# Patient Record
Sex: Female | Born: 1955
Health system: Southern US, Community
[De-identification: ages and names within clinical notes are randomized; demographics above are authoritative.]

## PROBLEM LIST (undated history)

## (undated) DIAGNOSIS — J4530 Mild persistent asthma, uncomplicated: Secondary | ICD-10-CM

## (undated) DIAGNOSIS — S83249A Other tear of medial meniscus, current injury, unspecified knee, initial encounter: Secondary | ICD-10-CM

## (undated) DIAGNOSIS — IMO0001 Reserved for inherently not codable concepts without codable children: Secondary | ICD-10-CM

## (undated) DIAGNOSIS — C50312 Malignant neoplasm of lower-inner quadrant of left female breast: Secondary | ICD-10-CM

## (undated) DIAGNOSIS — K219 Gastro-esophageal reflux disease without esophagitis: Secondary | ICD-10-CM

## (undated) DIAGNOSIS — Z170 Estrogen receptor positive status [ER+]: Principal | ICD-10-CM

## (undated) DIAGNOSIS — M19079 Primary osteoarthritis, unspecified ankle and foot: Secondary | ICD-10-CM

## (undated) DIAGNOSIS — Z923 Personal history of irradiation: Secondary | ICD-10-CM

## (undated) DIAGNOSIS — J189 Pneumonia, unspecified organism: Secondary | ICD-10-CM

## (undated) DIAGNOSIS — B009 Herpesviral infection, unspecified: Secondary | ICD-10-CM

## (undated) DIAGNOSIS — R0600 Dyspnea, unspecified: Secondary | ICD-10-CM

## (undated) DIAGNOSIS — M1990 Unspecified osteoarthritis, unspecified site: Secondary | ICD-10-CM

## (undated) DIAGNOSIS — Z9221 Personal history of antineoplastic chemotherapy: Secondary | ICD-10-CM

## (undated) DIAGNOSIS — R87619 Unspecified abnormal cytological findings in specimens from cervix uteri: Secondary | ICD-10-CM

## (undated) DIAGNOSIS — E1165 Type 2 diabetes mellitus with hyperglycemia: Secondary | ICD-10-CM

## (undated) DIAGNOSIS — J309 Allergic rhinitis, unspecified: Secondary | ICD-10-CM

## (undated) DIAGNOSIS — C50919 Malignant neoplasm of unspecified site of unspecified female breast: Secondary | ICD-10-CM

## (undated) DIAGNOSIS — D219 Benign neoplasm of connective and other soft tissue, unspecified: Secondary | ICD-10-CM

## (undated) HISTORY — DX: Gastro-esophageal reflux disease without esophagitis: K21.9

## (undated) HISTORY — PX: TUBAL LIGATION: SHX77

## (undated) HISTORY — DX: Benign neoplasm of connective and other soft tissue, unspecified: D21.9

## (undated) HISTORY — PX: DILATION AND CURETTAGE OF UTERUS: SHX78

## (undated) HISTORY — DX: Personal history of irradiation: Z92.3

## (undated) HISTORY — DX: Mild persistent asthma, uncomplicated: J45.30

## (undated) HISTORY — DX: Reserved for inherently not codable concepts without codable children: IMO0001

## (undated) HISTORY — DX: Allergic rhinitis, unspecified: J30.9

## (undated) HISTORY — PX: BREAST LUMPECTOMY: SHX2

## (undated) HISTORY — PX: BREAST BIOPSY: SHX20

## (undated) HISTORY — DX: Estrogen receptor positive status (ER+): Z17.0

## (undated) HISTORY — DX: Malignant neoplasm of lower-inner quadrant of left female breast: C50.312

## (undated) HISTORY — DX: Type 2 diabetes mellitus with hyperglycemia: E11.65

## (undated) HISTORY — DX: Herpesviral infection, unspecified: B00.9

## (undated) HISTORY — DX: Morbid (severe) obesity due to excess calories: E66.01

## (undated) HISTORY — PX: TONSILLECTOMY: SUR1361

## (undated) HISTORY — DX: Unspecified abnormal cytological findings in specimens from cervix uteri: R87.619

---

## 2000-10-13 ENCOUNTER — Encounter: Admission: RE | Admit: 2000-10-13 | Discharge: 2000-10-13 | Payer: Self-pay | Admitting: *Deleted

## 2000-10-14 ENCOUNTER — Encounter: Admission: RE | Admit: 2000-10-14 | Discharge: 2000-10-14 | Payer: Self-pay | Admitting: *Deleted

## 2003-11-24 ENCOUNTER — Emergency Department (HOSPITAL_COMMUNITY): Admission: EM | Admit: 2003-11-24 | Discharge: 2003-11-24 | Payer: Self-pay | Admitting: Internal Medicine

## 2004-12-07 ENCOUNTER — Emergency Department (HOSPITAL_COMMUNITY): Admission: EM | Admit: 2004-12-07 | Discharge: 2004-12-07 | Payer: Self-pay | Admitting: Emergency Medicine

## 2004-12-31 ENCOUNTER — Emergency Department (HOSPITAL_COMMUNITY): Admission: EM | Admit: 2004-12-31 | Discharge: 2004-12-31 | Payer: Self-pay | Admitting: Family Medicine

## 2005-01-05 ENCOUNTER — Emergency Department (HOSPITAL_COMMUNITY): Admission: EM | Admit: 2005-01-05 | Discharge: 2005-01-05 | Payer: Self-pay | Admitting: Family Medicine

## 2005-04-16 ENCOUNTER — Emergency Department (HOSPITAL_COMMUNITY): Admission: EM | Admit: 2005-04-16 | Discharge: 2005-04-16 | Payer: Self-pay | Admitting: Family Medicine

## 2006-05-26 ENCOUNTER — Emergency Department (HOSPITAL_COMMUNITY): Admission: EM | Admit: 2006-05-26 | Discharge: 2006-05-26 | Payer: Self-pay | Admitting: Family Medicine

## 2006-06-29 DIAGNOSIS — D219 Benign neoplasm of connective and other soft tissue, unspecified: Secondary | ICD-10-CM

## 2006-06-29 HISTORY — DX: Benign neoplasm of connective and other soft tissue, unspecified: D21.9

## 2006-06-29 HISTORY — PX: HYSTEROSCOPY W/D&C: SHX1775

## 2006-07-09 ENCOUNTER — Ambulatory Visit: Payer: Self-pay | Admitting: Internal Medicine

## 2006-07-09 LAB — CONVERTED CEMR LAB
Basophils Absolute: 0.1 10*3/uL (ref 0.0–0.1)
Basophils Relative: 0.7 % (ref 0.0–1.0)
Eosinophil percent: 2.4 % (ref 0.0–5.0)
FSH: 14.1 milliintl units/mL
HCT: 41.1 % (ref 36.0–46.0)
Hemoglobin: 13.4 g/dL (ref 12.0–15.0)
INR: 1.1 (ref 0.9–2.0)
Iron: 53 ug/dL (ref 42–145)
Lymphocytes Relative: 24.1 % (ref 12.0–46.0)
MCHC: 32.6 g/dL (ref 30.0–36.0)
MCV: 81.5 fL (ref 78.0–100.0)
Monocytes Absolute: 0.8 10*3/uL — ABNORMAL HIGH (ref 0.2–0.7)
Monocytes Relative: 8.7 % (ref 3.0–11.0)
Neutro Abs: 5.9 10*3/uL (ref 1.4–7.7)
Neutrophils Relative %: 64.1 % (ref 43.0–77.0)
Platelets: 394 10*3/uL (ref 150–400)
Prothrombin Time: 12.9 s (ref 10.0–14.0)
RBC: 5.04 M/uL (ref 3.87–5.11)
RDW: 14.3 % (ref 11.5–14.6)
Saturation Ratios: 13.4 % — ABNORMAL LOW (ref 20.0–50.0)
Transferrin: 283.2 mg/dL (ref >=212.0)
WBC: 9.2 10*3/uL (ref 4.5–10.5)

## 2006-07-13 ENCOUNTER — Encounter: Admission: RE | Admit: 2006-07-13 | Discharge: 2006-07-13 | Payer: Self-pay | Admitting: Internal Medicine

## 2006-08-16 ENCOUNTER — Ambulatory Visit (HOSPITAL_COMMUNITY): Admission: RE | Admit: 2006-08-16 | Discharge: 2006-08-16 | Payer: Self-pay | Admitting: *Deleted

## 2006-08-16 ENCOUNTER — Encounter (INDEPENDENT_AMBULATORY_CARE_PROVIDER_SITE_OTHER): Payer: Self-pay | Admitting: Specialist

## 2007-01-18 ENCOUNTER — Emergency Department (HOSPITAL_COMMUNITY): Admission: EM | Admit: 2007-01-18 | Discharge: 2007-01-18 | Payer: Self-pay | Admitting: Emergency Medicine

## 2007-04-21 ENCOUNTER — Encounter: Admission: RE | Admit: 2007-04-21 | Discharge: 2007-04-21 | Payer: Self-pay | Admitting: Internal Medicine

## 2007-06-30 DIAGNOSIS — R87619 Unspecified abnormal cytological findings in specimens from cervix uteri: Secondary | ICD-10-CM

## 2007-06-30 HISTORY — DX: Unspecified abnormal cytological findings in specimens from cervix uteri: R87.619

## 2008-03-19 ENCOUNTER — Encounter: Payer: Self-pay | Admitting: Internal Medicine

## 2008-06-12 ENCOUNTER — Emergency Department (HOSPITAL_COMMUNITY): Admission: EM | Admit: 2008-06-12 | Discharge: 2008-06-12 | Payer: Self-pay | Admitting: Emergency Medicine

## 2008-08-05 ENCOUNTER — Emergency Department (HOSPITAL_COMMUNITY): Admission: EM | Admit: 2008-08-05 | Discharge: 2008-08-05 | Payer: Self-pay | Admitting: Family Medicine

## 2008-08-29 ENCOUNTER — Encounter: Admission: RE | Admit: 2008-08-29 | Discharge: 2008-08-29 | Payer: Self-pay | Admitting: Internal Medicine

## 2008-09-03 ENCOUNTER — Encounter: Payer: Self-pay | Admitting: Internal Medicine

## 2008-09-05 ENCOUNTER — Encounter: Admission: RE | Admit: 2008-09-05 | Discharge: 2008-09-05 | Payer: Self-pay | Admitting: Internal Medicine

## 2009-12-16 ENCOUNTER — Emergency Department (HOSPITAL_COMMUNITY): Admission: EM | Admit: 2009-12-16 | Discharge: 2009-12-16 | Payer: Self-pay | Admitting: Family Medicine

## 2010-07-07 ENCOUNTER — Ambulatory Visit
Admission: RE | Admit: 2010-07-07 | Discharge: 2010-07-07 | Payer: Self-pay | Source: Home / Self Care | Attending: Orthopedic Surgery | Admitting: Orthopedic Surgery

## 2010-07-07 HISTORY — PX: KNEE ARTHROSCOPY W/ MENISCECTOMY: SHX1879

## 2010-07-14 LAB — POCT HEMOGLOBIN-HEMACUE: Hemoglobin: 14.5 g/dL (ref 12.0–15.0)

## 2010-07-29 ENCOUNTER — Encounter
Admission: RE | Admit: 2010-07-29 | Discharge: 2010-07-29 | Payer: Self-pay | Source: Home / Self Care | Attending: Orthopedic Surgery | Admitting: Orthopedic Surgery

## 2010-07-31 ENCOUNTER — Ambulatory Visit: Payer: PRIVATE HEALTH INSURANCE | Attending: Orthopedic Surgery | Admitting: Physical Therapy

## 2010-07-31 DIAGNOSIS — M25569 Pain in unspecified knee: Secondary | ICD-10-CM | POA: Insufficient documentation

## 2010-07-31 DIAGNOSIS — M6281 Muscle weakness (generalized): Secondary | ICD-10-CM | POA: Insufficient documentation

## 2010-07-31 DIAGNOSIS — R5381 Other malaise: Secondary | ICD-10-CM | POA: Insufficient documentation

## 2010-07-31 DIAGNOSIS — IMO0001 Reserved for inherently not codable concepts without codable children: Secondary | ICD-10-CM | POA: Insufficient documentation

## 2010-08-04 ENCOUNTER — Ambulatory Visit: Payer: PRIVATE HEALTH INSURANCE | Admitting: Physical Therapy

## 2010-08-06 ENCOUNTER — Ambulatory Visit: Payer: PRIVATE HEALTH INSURANCE

## 2010-08-11 ENCOUNTER — Ambulatory Visit: Payer: PRIVATE HEALTH INSURANCE

## 2010-11-14 NOTE — Op Note (Signed)
[name], Sandra Wood            ACCOUNT NO.:  [id number]   MEDICAL RECORD NO.:  [id number]          PATIENT TYPE:  AMB   LOCATION:  SDC                           FACILITY:  [facility]   PHYSICIAN:  Ashton B. Earlene Plater, M.D.  DATE OF BIRTH:  January 03, 1956   DATE OF PROCEDURE:  08/16/2006  DATE OF DISCHARGE:                               OPERATIVE REPORT   PREOPERATIVE DIAGNOSES:  1. Abnormal uterine bleeding.  2. Endometrial polyp.   POSTOPERATIVE DIAGNOSES:  1. Abnormal uterine bleeding.  2. Endometrial polyp.   PROCEDURE:  Hysteroscopy, D&C.   SURGEON:  Bellflower B. Earlene Plater, M.D.   ASSISTANT:  None.   ANESTHESIA:  LMA general.   SPECIMENS:  Endometrial curettings to pathology.   ESTIMATED BLOOD LOSS:  Minimal.   COMPLICATIONS:  None.   FLUID DEFICIT:  50 mL.   INDICATIONS FOR PROCEDURE:  Patient with a history of abnormal uterine  bleeding, history of obesity, and perimenopause.  Ultrasound showed a  thickened stripe and some small intramural fibroids.  Endometrial  biopsies showed a proliferative endometrium and features suggestive of  an endometrial polyp.  The patient was advised of the risks of surgery,  including infection, bleeding, uterine perforation, damage to  surrounding organs.   DESCRIPTION OF PROCEDURE:  The patient was taken to the operating table  and LMA anesthesia obtained.  She was prepped and draped in the standard  fashion and the bladder emptied with in-and-out catheterization.  Examination under anesthesia showed anteverted, normal-size uterus.  No  adnexal masses.   The speculum was inserted and paracervical block placed with 20 mL of 1%  Nesacaine.   The diagnostic hysteroscope was inserted after being flushed.  Good  uterine distension obtained.  Overall the endometrium appeared as  proliferative.  There were no definitive focal masses seen.  Some areas  of proliferative endometrium looked somewhat like a polyp.  The  endometrium was gently curetted.   The Randall stone forceps were used to  remove any additional tissue and the procedure terminated.   The instruments were removed.  The cervix was hemostatic.  The patient  tolerated the procedure well with no complications.  She was taken to  the recovery room awake, alert, and in stable condition.      Gerri Spore B. Earlene Plater, M.D.  Electronically Signed     WBD/MEDQ  D:  08/16/2006  T:  08/16/2006  Job:  [id number]

## 2011-03-03 ENCOUNTER — Inpatient Hospital Stay (INDEPENDENT_AMBULATORY_CARE_PROVIDER_SITE_OTHER)
Admission: RE | Admit: 2011-03-03 | Discharge: 2011-03-03 | Disposition: A | Discharge status: Home / Self Care | Payer: PRIVATE HEALTH INSURANCE | Source: Ambulatory Visit | Attending: Emergency Medicine | Admitting: Emergency Medicine

## 2011-03-03 DIAGNOSIS — J309 Allergic rhinitis, unspecified: Secondary | ICD-10-CM

## 2011-04-13 LAB — URINALYSIS, ROUTINE W REFLEX MICROSCOPIC
Bilirubin Urine: NEGATIVE
Glucose, UA: NEGATIVE
Hgb urine dipstick: NEGATIVE
Ketones, ur: NEGATIVE
Nitrite: NEGATIVE
Protein, ur: NEGATIVE
Specific Gravity, Urine: 1.018
Urobilinogen, UA: 0.2
pH: 6.5

## 2011-04-13 LAB — POCT URINALYSIS DIP (DEVICE)
Bilirubin Urine: NEGATIVE
Glucose, UA: NEGATIVE
Hgb urine dipstick: NEGATIVE
Ketones, ur: NEGATIVE
Nitrite: NEGATIVE
Operator id: 116391
Protein, ur: NEGATIVE
Specific Gravity, Urine: 1.02
Urobilinogen, UA: 0.2
pH: 6.5

## 2011-04-13 LAB — URINE CULTURE: Colony Count: 85000

## 2011-04-13 LAB — URINE MICROSCOPIC-ADD ON

## 2011-08-05 ENCOUNTER — Other Ambulatory Visit: Payer: Self-pay | Admitting: Orthopedic Surgery

## 2011-08-14 ENCOUNTER — Encounter (HOSPITAL_BASED_OUTPATIENT_CLINIC_OR_DEPARTMENT_OTHER): Payer: Self-pay | Admitting: *Deleted

## 2011-08-14 NOTE — Progress Notes (Signed)
NPO AFTER MN. ARRIVES AT 0700. NEEDS HG AND EKG. 

## 2011-08-19 ENCOUNTER — Encounter (HOSPITAL_BASED_OUTPATIENT_CLINIC_OR_DEPARTMENT_OTHER)
Admission: RE | Disposition: A | Discharge status: Home / Self Care | Payer: Self-pay | Source: Ambulatory Visit | Attending: Orthopedic Surgery

## 2011-08-19 ENCOUNTER — Encounter (HOSPITAL_BASED_OUTPATIENT_CLINIC_OR_DEPARTMENT_OTHER): Payer: Self-pay | Admitting: Anesthesiology

## 2011-08-19 ENCOUNTER — Ambulatory Visit (HOSPITAL_BASED_OUTPATIENT_CLINIC_OR_DEPARTMENT_OTHER)
Admission: RE | Admit: 2011-08-19 | Discharge: 2011-08-19 | Disposition: A | Discharge status: Home / Self Care | Payer: PRIVATE HEALTH INSURANCE | Source: Ambulatory Visit | Attending: Orthopedic Surgery | Admitting: Orthopedic Surgery

## 2011-08-19 ENCOUNTER — Ambulatory Visit (HOSPITAL_BASED_OUTPATIENT_CLINIC_OR_DEPARTMENT_OTHER): Payer: PRIVATE HEALTH INSURANCE | Admitting: Anesthesiology

## 2011-08-19 ENCOUNTER — Other Ambulatory Visit: Payer: Self-pay

## 2011-08-19 ENCOUNTER — Encounter (HOSPITAL_BASED_OUTPATIENT_CLINIC_OR_DEPARTMENT_OTHER): Payer: Self-pay | Admitting: *Deleted

## 2011-08-19 DIAGNOSIS — Z87898 Personal history of other specified conditions: Secondary | ICD-10-CM

## 2011-08-19 DIAGNOSIS — Z79899 Other long term (current) drug therapy: Secondary | ICD-10-CM | POA: Insufficient documentation

## 2011-08-19 DIAGNOSIS — X58XXXA Exposure to other specified factors, initial encounter: Secondary | ICD-10-CM | POA: Insufficient documentation

## 2011-08-19 DIAGNOSIS — IMO0002 Reserved for concepts with insufficient information to code with codable children: Secondary | ICD-10-CM | POA: Insufficient documentation

## 2011-08-19 DIAGNOSIS — M1710 Unilateral primary osteoarthritis, unspecified knee: Secondary | ICD-10-CM | POA: Insufficient documentation

## 2011-08-19 DIAGNOSIS — S83289A Other tear of lateral meniscus, current injury, unspecified knee, initial encounter: Secondary | ICD-10-CM | POA: Insufficient documentation

## 2011-08-19 HISTORY — DX: Other tear of medial meniscus, current injury, unspecified knee, initial encounter: S83.249A

## 2011-08-19 HISTORY — DX: Primary osteoarthritis, unspecified ankle and foot: M19.079

## 2011-08-19 HISTORY — DX: Unspecified osteoarthritis, unspecified site: M19.90

## 2011-08-19 HISTORY — PX: MENISECTOMY: SHX5181

## 2011-08-19 HISTORY — PX: CHONDROPLASTY: SHX5177

## 2011-08-19 SURGERY — ARTHROSCOPY, KNEE, WITH MEDIAL MENISCECTOMY
Anesthesia: General | Site: Knee | Laterality: Right | Wound class: Clean

## 2011-08-19 MED ORDER — ACETAMINOPHEN 650 MG RE SUPP: 650.0000 mg | Freq: Four times a day (QID) | RECTAL | Status: DC | PRN

## 2011-08-19 MED ORDER — ONDANSETRON HCL 4 MG/2ML IJ SOLN
INTRAMUSCULAR | Status: DC | PRN
  Administered 2011-08-19: 4 mg via INTRAVENOUS

## 2011-08-19 MED ORDER — METOCLOPRAMIDE HCL 5 MG PO TABS: 5.0000 mg | ORAL_TABLET | Freq: Three times a day (TID) | ORAL | Status: DC | PRN

## 2011-08-19 MED ORDER — OXYCODONE-ACETAMINOPHEN 5-325 MG PO TABS
1.0000 | ORAL_TABLET | Freq: Once | ORAL | Status: AC
  Administered 2011-08-19: 1 via ORAL

## 2011-08-19 MED ORDER — METHOCARBAMOL 500 MG PO TABS: 500.0000 mg | ORAL_TABLET | Freq: Four times a day (QID) | ORAL | Status: DC | PRN

## 2011-08-19 MED ORDER — MENTHOL 3 MG MT LOZG: 1.0000 | LOZENGE | OROMUCOSAL | Status: DC | PRN

## 2011-08-19 MED ORDER — LIDOCAINE HCL (CARDIAC) 20 MG/ML IV SOLN
INTRAVENOUS | Status: DC | PRN
  Administered 2011-08-19: 80 mg via INTRAVENOUS

## 2011-08-19 MED ORDER — ALBUTEROL SULFATE HFA 108 (90 BASE) MCG/ACT IN AERS
INHALATION_SPRAY | RESPIRATORY_TRACT | Status: DC | PRN
  Administered 2011-08-19 (×2): 2 via RESPIRATORY_TRACT

## 2011-08-19 MED ORDER — PROPOFOL 10 MG/ML IV EMUL
INTRAVENOUS | Status: DC | PRN
  Administered 2011-08-19: 200 mg via INTRAVENOUS
  Administered 2011-08-19: 50 mg via INTRAVENOUS

## 2011-08-19 MED ORDER — OXYCODONE-ACETAMINOPHEN 5-325 MG PO TABS: 1.0000 | ORAL_TABLET | ORAL | Status: AC | PRN

## 2011-08-19 MED ORDER — FENTANYL CITRATE 0.05 MG/ML IJ SOLN
INTRAMUSCULAR | Status: DC | PRN
  Administered 2011-08-19 (×2): 50 ug via INTRAVENOUS
  Administered 2011-08-19 (×2): 25 ug via INTRAVENOUS

## 2011-08-19 MED ORDER — DEXAMETHASONE SODIUM PHOSPHATE 4 MG/ML IJ SOLN
INTRAMUSCULAR | Status: DC | PRN
  Administered 2011-08-19: 10 mg via INTRAVENOUS

## 2011-08-19 MED ORDER — ONDANSETRON HCL 4 MG/2ML IJ SOLN: 4.0000 mg | Freq: Four times a day (QID) | INTRAMUSCULAR | Status: DC | PRN

## 2011-08-19 MED ORDER — ONDANSETRON HCL 4 MG PO TABS: 4.0000 mg | ORAL_TABLET | Freq: Four times a day (QID) | ORAL | Status: DC | PRN

## 2011-08-19 MED ORDER — POVIDONE-IODINE 7.5 % EX SOLN: Freq: Once | CUTANEOUS | Status: DC

## 2011-08-19 MED ORDER — METHOCARBAMOL 500 MG PO TABS: 500.0000 mg | ORAL_TABLET | Freq: Four times a day (QID) | ORAL | Status: AC | PRN

## 2011-08-19 MED ORDER — FENTANYL CITRATE 0.05 MG/ML IJ SOLN
25.0000 ug | INTRAMUSCULAR | Status: DC | PRN
  Administered 2011-08-19 (×2): 25 ug via INTRAVENOUS

## 2011-08-19 MED ORDER — METOCLOPRAMIDE HCL 5 MG/ML IJ SOLN: 5.0000 mg | Freq: Three times a day (TID) | INTRAMUSCULAR | Status: DC | PRN

## 2011-08-19 MED ORDER — PROMETHAZINE HCL 25 MG/ML IJ SOLN: 6.2500 mg | INTRAMUSCULAR | Status: DC | PRN

## 2011-08-19 MED ORDER — MORPHINE SULFATE 4 MG/ML IJ SOLN
INTRAMUSCULAR | Status: DC | PRN
  Administered 2011-08-19: 09:00:00 via INTRA_ARTICULAR

## 2011-08-19 MED ORDER — PHENOL 1.4 % MT LIQD: 1.0000 | OROMUCOSAL | Status: DC | PRN

## 2011-08-19 MED ORDER — SODIUM CHLORIDE 0.9 % IR SOLN
Status: DC | PRN
  Administered 2011-08-19: 09:00:00

## 2011-08-19 MED ORDER — LACTATED RINGERS IV SOLN: INTRAVENOUS | Status: DC

## 2011-08-19 MED ORDER — METHOCARBAMOL 100 MG/ML IJ SOLN: 500.0000 mg | Freq: Four times a day (QID) | INTRAVENOUS | Status: DC | PRN

## 2011-08-19 MED ORDER — ACETAMINOPHEN 325 MG PO TABS: 650.0000 mg | ORAL_TABLET | Freq: Four times a day (QID) | ORAL | Status: DC | PRN

## 2011-08-19 MED ORDER — HYDROMORPHONE HCL PF 1 MG/ML IJ SOLN: 0.5000 mg | INTRAMUSCULAR | Status: DC | PRN

## 2011-08-19 MED ORDER — LACTATED RINGERS IV SOLN
INTRAVENOUS | Status: DC
  Administered 2011-08-19 (×3): via INTRAVENOUS

## 2011-08-19 SURGICAL SUPPLY — 51 items
BANDAGE ELASTIC 6 VELCRO ST LF (GAUZE/BANDAGES/DRESSINGS) ×3 IMPLANT
BANDAGE ESMARK 6X9 LF (GAUZE/BANDAGES/DRESSINGS) ×2 IMPLANT
BANDAGE GAUZE ELAST BULKY 4 IN (GAUZE/BANDAGES/DRESSINGS) ×3 IMPLANT
BLADE 4.2CUDA (BLADE) IMPLANT
BLADE CUDA 5.5 (BLADE) IMPLANT
BLADE CUDA SHAVER 3.5 (BLADE) ×3 IMPLANT
BLADE CUTTER GATOR 3.5 (BLADE) IMPLANT
BLADE GREAT WHITE 4.2 (BLADE) IMPLANT
BNDG ESMARK 6X9 LF (GAUZE/BANDAGES/DRESSINGS) ×3
CANISTER SUCT LVC 12 LTR MEDI- (MISCELLANEOUS) ×3 IMPLANT
CANISTER SUCTION 1200CC (MISCELLANEOUS) IMPLANT
CLOTH BEACON ORANGE TIMEOUT ST (SAFETY) ×3 IMPLANT
DRAPE ARTHROSCOPY W/POUCH 114 (DRAPES) ×3 IMPLANT
DRAPE LG THREE QUARTER DISP (DRAPES) ×3 IMPLANT
DRSG EMULSION OIL 3X3 NADH (GAUZE/BANDAGES/DRESSINGS) ×3 IMPLANT
DURAPREP 26ML APPLICATOR (WOUND CARE) ×3 IMPLANT
ELECT MENISCUS 165MM 90D (ELECTRODE) IMPLANT
ELECT REM PT RETURN 9FT ADLT (ELECTROSURGICAL)
ELECTRODE REM PT RTRN 9FT ADLT (ELECTROSURGICAL) IMPLANT
GLOVE BIOGEL M 6.5 STRL (GLOVE) ×3 IMPLANT
GLOVE BIOGEL PI IND STRL 8 (GLOVE) ×2 IMPLANT
GLOVE BIOGEL PI INDICATOR 8 (GLOVE) ×1
GLOVE ECLIPSE 6.0 STRL STRAW (GLOVE) ×3 IMPLANT
GLOVE ECLIPSE 8.0 STRL XLNG CF (GLOVE) ×6 IMPLANT
GLOVE INDICATOR 8.0 STRL GRN (GLOVE) ×3 IMPLANT
GOWN PREVENTION PLUS LG XLONG (DISPOSABLE) ×3 IMPLANT
GOWN STRL REIN XL XLG (GOWN DISPOSABLE) ×3 IMPLANT
GOWN SURGICAL LARGE (GOWNS) ×3 IMPLANT
IV NS IRRIG 3000ML ARTHROMATIC (IV SOLUTION) ×6 IMPLANT
KNEE WRAP E Z 3 GEL PACK (MISCELLANEOUS) ×3 IMPLANT
NDL SAFETY ECLIPSE 18X1.5 (NEEDLE) ×2 IMPLANT
NEEDLE HYPO 18GX1.5 BLUNT FILL (NEEDLE) ×3 IMPLANT
NEEDLE HYPO 18GX1.5 SHARP (NEEDLE) ×1
PACK ARTHROSCOPY DSU (CUSTOM PROCEDURE TRAY) ×3 IMPLANT
PACK BASIN DAY SURGERY FS (CUSTOM PROCEDURE TRAY) ×3 IMPLANT
PADDING CAST ABS 4INX4YD NS (CAST SUPPLIES) ×1
PADDING CAST ABS COTTON 4X4 ST (CAST SUPPLIES) ×2 IMPLANT
PADDING WEBRIL 6 STERILE (GAUZE/BANDAGES/DRESSINGS) ×3 IMPLANT
PENCIL BUTTON HOLSTER BLD 10FT (ELECTRODE) IMPLANT
SET ARTHROSCOPY TUBING (MISCELLANEOUS) ×1
SET ARTHROSCOPY TUBING LN (MISCELLANEOUS) ×2 IMPLANT
SPONGE GAUZE 4X4 12PLY (GAUZE/BANDAGES/DRESSINGS) ×3 IMPLANT
SUT ETHIBOND 2 OS 4 DA (SUTURE) IMPLANT
SUT ETHILON 4 0 PS 2 18 (SUTURE) ×3 IMPLANT
SUT VIC AB 0 CT1 36 (SUTURE) IMPLANT
SUT VIC AB 2-0 PS2 27 (SUTURE) IMPLANT
SYRINGE 10CC LL (SYRINGE) ×3 IMPLANT
TOWEL NATURAL 6PK STERILE (DISPOSABLE) ×3 IMPLANT
TOWEL OR 17X24 6PK STRL BLUE (TOWEL DISPOSABLE) ×3 IMPLANT
WAND 90 DEG TURBOVAC W/CORD (SURGICAL WAND) IMPLANT
WATER STERILE IRR 500ML POUR (IV SOLUTION) ×3 IMPLANT

## 2011-08-19 NOTE — Brief Op Note (Signed)
08/19/2011  9:26 AM  PATIENT:  Sandra Wood  56 y.o. female  PRE-OPERATIVE DIAGNOSIS:  RIGHT KNEE TORN MEDIAL MENISCUS and osteoarthritis  POST-OPERATIVE DIAGNOSIS:  RIGHT KNEE TORN MEDIAL MENISCUS ,torn lateral meniscus and osteoarthritis  PROCEDURE:  Procedure(s) (LRB): KNEE ARTHROSCOPY WITH partial medial and lateral meniscectomies,shaving medial femoral condyle CHONDROPLASTY () MENISECTOMY ()  SURGEON:  Surgeon(s) and Role:    * Drucilla Schmidt, MD - Primary  PHYSICIAN ASSISTANT:   ASSISTANTS: nurse   ANESTHESIA:   general  EBL:  Total I/O In: 1000 [I.V.:1000] Out: -   BLOOD ADMINISTERED:none  DRAINS: none   LOCAL MEDICATIONS USED:  MARCAINE     SPECIMEN:  No Specimen  DISPOSITION OF SPECIMEN:  N/A  COUNTS:  YES  TOURNIQUET:   Total Tourniquet Time Documented: Thigh (Right) - 35 minutes  DICTATION: .Other Dictation: Dictation Number [id number]  PLAN OF CARE: Discharge to home after PACU  PATIENT DISPOSITION:  PACU - hemodynamically stable.   Delay start of Pharmacological VTE agent (>24hrs) due to surgical blood loss or risk of bleeding: not applicable

## 2011-08-19 NOTE — Anesthesia Procedure Notes (Signed)
Procedure Name: LMA Insertion Date/Time: 08/19/2011 8:28 AM Performed by: Lorrin Jackson Pre-anesthesia Checklist: Patient identified, Emergency Drugs available, Suction available and Patient being monitored Patient Re-evaluated:Patient Re-evaluated prior to inductionOxygen Delivery Method: Circle System Utilized Preoxygenation: Pre-oxygenation with 100% oxygen Intubation Type: IV induction Ventilation: Mask ventilation without difficulty LMA: LMA with gastric port inserted LMA Size: 4.0 Number of attempts: 1 Placement Confirmation: positive ETCO2 Tube secured with: Tape Dental Injury: Teeth and Oropharynx as per pre-operative assessment

## 2011-08-19 NOTE — H&P (Signed)
Sandra Wood is an 56 y.o. female.   Chief Complaint: Pain in Rt. knee HPI: persistent Rt. Knee pain with wt. Bearing and ROM.  Past Medical History  Diagnosis Date  . Tear of medial meniscus of knee LEFT  . Arthritis   . Arthritis of ankle BILATERAL / HX FX'S    Past Surgical History  Procedure Date  . Knee arthroscopy w/ meniscectomy 07-07-2010  . Hysteroscopy w/d&c 2008  . Tubal ligation AGE 67    History reviewed. No pertinent family history. Social History:  reports that she quit smoking about 25 years ago. Her smoking use included Cigarettes. She quit after 5 years of use. She has never used smokeless tobacco. She reports that she drinks alcohol. She reports that she does not use illicit drugs.  Allergies: No Known Allergies  Medications Prior to Admission  Medication Dose Route Frequency Provider Last Rate Last Dose  . lactated ringers infusion   Intravenous Continuous Jill Side, MD 50 mL/hr at 08/19/11 [phone]    . povidone-iodine (BETADINE) 7.5 % scrub   Topical Once Drucilla Schmidt, MD       Medications Prior to Admission  Medication Sig Dispense Refill  . meloxicam (MOBIC) 7.5 MG tablet Take 7.5 mg by mouth as needed.      . Multiple Vitamin (MULTIVITAMIN) tablet Take 1 tablet by mouth daily.        No results found for this or any previous visit (from the past 48 hour(s)). No results found.  Review of Systems  Constitutional: Negative.   HENT: Negative.   Eyes: Negative.   Respiratory: Negative.   Cardiovascular: Negative.   Gastrointestinal: Negative.   Genitourinary: Negative.   Musculoskeletal: Positive for joint pain.  Skin: Negative.   Neurological: Negative.   Endo/Heme/Allergies: Negative.   Psychiatric/Behavioral: Negative.     Height 5\' 4"  (1.626 m), weight 136.079 kg (300 lb). Physical Exam  Constitutional: She appears well-developed and well-nourished.  HENT:  Head: Normocephalic and atraumatic.  Eyes: Conjunctivae are  normal. Pupils are equal, round, and reactive to light.  Neck: Neck supple.  Cardiovascular: Normal rate and regular rhythm.   Respiratory: Effort normal and breath sounds normal.  GI: Soft.  Musculoskeletal: She exhibits tenderness.  Skin: Skin is warm.  Psychiatric: She has a normal mood and affect.     Assessment/Plan Torn medial meniscus:  Rt. Knee arthroscopy with partial excision of medial meniscus.  UNDERWOOD III,DOOLEY L 08/19/2011, 8:01 AM

## 2011-08-19 NOTE — Anesthesia Preprocedure Evaluation (Addendum)
Anesthesia Evaluation  Patient identified by MRN, date of birth, ID band Patient awake    Reviewed: Allergy & Precautions, H&P , NPO status , Patient's Chart, lab work & pertinent test results  Airway Mallampati: II TM Distance: >3 FB Neck ROM: full    Dental No notable dental hx. (+) Teeth Intact and Dental Advisory Given   Pulmonary neg pulmonary ROS,  clear to auscultation  Pulmonary exam normal       Cardiovascular Exercise Tolerance: Good neg cardio ROS regular Normal    Neuro/Psych Negative Neurological ROS  Negative Psych ROS   GI/Hepatic negative GI ROS, Neg liver ROS,   Endo/Other  Negative Endocrine ROSMorbid obesity  Renal/GU negative Renal ROS  Genitourinary negative   Musculoskeletal   Abdominal (+) obese,   Peds  Hematology negative hematology ROS (+)   Anesthesia Other Findings   Reproductive/Obstetrics negative OB ROS                          Anesthesia Physical Anesthesia Plan  ASA: II  Anesthesia Plan: General   Post-op Pain Management:    Induction: Intravenous  Airway Management Planned: LMA  Additional Equipment:   Intra-op Plan:   Post-operative Plan:   Informed Consent: I have reviewed the patients History and Physical, chart, labs and discussed the procedure including the risks, benefits and alternatives for the proposed anesthesia with the patient or authorized representative who has indicated his/her understanding and acceptance.   Dental Advisory Given  Plan Discussed with: CRNA and Surgeon  Anesthesia Plan Comments:         Anesthesia Quick Evaluation

## 2011-08-19 NOTE — Anesthesia Postprocedure Evaluation (Signed)
  Anesthesia Post-op Note  Patient: Sandra Wood  Procedure(s) Performed: Procedure(s) (LRB): KNEE ARTHROSCOPY WITH MEDIAL MENISECTOMY (Right) CHONDROPLASTY () MENISECTOMY ()  Patient Location: PACU  Anesthesia Type: General  Level of Consciousness: awake and alert   Airway and Oxygen Therapy: Patient Spontanous Breathing  Post-op Pain: mild  Post-op Assessment: Post-op Vital signs reviewed, Patient's Cardiovascular Status Stable, Respiratory Function Stable, Patent Airway and No signs of Nausea or vomiting  Post-op Vital Signs: stable  Complications: No apparent anesthesia complications

## 2011-08-19 NOTE — Transfer of Care (Signed)
Immediate Anesthesia Transfer of Care Note  Patient: Sandra Wood  Procedure(s) Performed: Procedure(s) (LRB): KNEE ARTHROSCOPY WITH MEDIAL MENISECTOMY (Right) CHONDROPLASTY () MENISECTOMY ()  Patient Location: Patient transported to PACU with oxygen via face mask at 4 Liters / Min  Anesthesia Type: General  Level of Consciousness: awake and alert   Airway & Oxygen Therapy: Patient Spontanous Breathing and Patient connected to face mask oxygen  Post-op Assessment: Report given to PACU RN and Post -op Vital signs reviewed and stable  Post vital signs: Reviewed and stable  Dentition: Teeth and oropharynx remain in pre-op condition  Complications: No apparent anesthesia complications

## 2011-08-20 ENCOUNTER — Encounter (HOSPITAL_BASED_OUTPATIENT_CLINIC_OR_DEPARTMENT_OTHER): Payer: Self-pay | Admitting: Orthopedic Surgery

## 2011-08-20 NOTE — Op Note (Signed)
NAMECOLBY, Sandra Wood            ACCOUNT NO.:  [id number]  MEDICAL RECORD NO.:  [id number]  LOCATION:                               FACILITY:  [facility]  PHYSICIAN:  Marlowe Kays, M.D.  DATE OF BIRTH:  12-Jan-1956  DATE OF PROCEDURE:  08/19/2011 DATE OF DISCHARGE:                              OPERATIVE REPORT   PREOPERATIVE DIAGNOSES: 1. Torn medial meniscus. 2. Osteoarthritis, right knee.  POSTOPERATIVE DIAGNOSES: 1. Torn medial and lateral menisci. 2. Osteoarthritis, right knee.  OPERATION:  Right knee arthroscopy with 1. Partial medial lateral meniscectomy. 2. Shaving of medial femoral condyle.  SURGEON:  Marlowe Kays, M.D.  ASSISTANT:  Nurse.  ANESTHESIA:  General.  PATHOLOGY AND JUSTIFICATION FOR PROCEDURE:  Painful right knee with an MRI demonstrating the preoperative diagnoses.  She also had significant fraying of the inner border of the lateral meniscus with 1 large strand in particular projecting into the joint, which I felt would be a potential problem subsequently and for this reason felt that a partial lateral meniscectomy was indicated as well.  PROCEDURE:  After satisfactory general anesthesia, because of her size we had to place her left leg in a lateral leg support, Ace wrap applied. In her right knee, I applied the largest tourniquet available, which was adequate and I then esmarched out her leg non-sterilely and tourniquet was inflated to 400 mmHg.  I then placed a thigh stabilizer, which we had to and we had to use hand towels rather than the usual foam rubber support because of her size.  The thigh stabilizer was also placed over the tourniquet.  Her right leg was then prepped with DuraPrep from stabilizer to ankle, draped in sterile field.  Time-out was performed. Superior, medial, saline inflow.  First, through an anterolateral portal the medial compartment of the knee joint was evaluated.  The entire posterior 40% of the medial meniscus was  torn, particularly towards the intercondylar area.  These areas were pictured and I then began resecting the meniscus back to a stable rim with a combination of baskets and the 3.5 shaver.  I also shaved down the medial femoral condyle until smooth.  I would rate the chondromalacia there as 2, bordering on 3/4.  When this had been completed and the meniscus was found to be stable on probing I then looked up in the medial gutter and suprapatellar area.  She had some diffuse wear of the patella, but really nothing shavable.  I then reversed portals.  Her joint surfaces looked good, but the lateral meniscus was as noted above, and I performed a partial lateral meniscectomy as well as shaving down the inner border of the lateral meniscus.  The knee joint was irrigated to clear, all fluid possible was removed.  I then closed the 2 entry portals with 4-0 nylon and injected through the inflow apparatus 20 mL of 0.5% Marcaine with adrenaline and 4 mg of morphine.  I closed this portal with 4-0 nylon as well.  Betadine, Adaptic, dry, sterile dressing were applied.  Tourniquet was released. She tolerated the procedure well and was taken to the recovery room in satisfactory condition with no known complications.  ______________________________ Marlowe Kays, M.D.     JA/MEDQ  D:  08/19/2011  T:  08/20/2011  Job:  [id number]

## 2011-09-25 ENCOUNTER — Encounter (HOSPITAL_COMMUNITY): Payer: Self-pay

## 2011-09-25 ENCOUNTER — Emergency Department (INDEPENDENT_AMBULATORY_CARE_PROVIDER_SITE_OTHER)
Admission: EM | Admit: 2011-09-25 | Discharge: 2011-09-25 | Disposition: A | Discharge status: Home Health | Payer: PRIVATE HEALTH INSURANCE | Source: Home / Self Care | Attending: Family Medicine | Admitting: Family Medicine

## 2011-09-25 DIAGNOSIS — R0789 Other chest pain: Secondary | ICD-10-CM

## 2011-09-25 DIAGNOSIS — J069 Acute upper respiratory infection, unspecified: Secondary | ICD-10-CM

## 2011-09-25 MED ORDER — FLUTICASONE PROPIONATE 50 MCG/ACT NA SUSP: 2.0000 | Freq: Every day | NASAL | Status: DC

## 2011-09-25 MED ORDER — HYDROCOD POLST-CHLORPHEN POLST 10-8 MG/5ML PO LQCR: 5.0000 mL | Freq: Two times a day (BID) | ORAL | Status: DC

## 2011-09-25 NOTE — ED Provider Notes (Signed)
History     CSN: [id number]  Arrival date & time 09/25/11  4098   First MD Initiated Contact with Patient 09/25/11 [phone]      Chief Complaint  Patient presents with  . Cough    (Consider location/radiation/quality/duration/timing/severity/associated sxs/prior treatment) Patient is a 56 y.o. female presenting with cough. The history is provided by the patient.  Cough This is a new problem. The current episode started more than 2 days ago. The problem occurs constantly. The problem has been gradually worsening. The cough is non-productive. There has been no fever. Associated symptoms include chest pain and rhinorrhea. Pertinent negatives include no shortness of breath and no wheezing. Associated symptoms comments: Cp after hard cough. She is not a smoker.    Past Medical History  Diagnosis Date  . Tear of medial meniscus of knee LEFT  . Arthritis   . Arthritis of ankle BILATERAL / HX FX'S    Past Surgical History  Procedure Date  . Knee arthroscopy w/ meniscectomy 07-07-2010  . Hysteroscopy w/d&c 2008  . Tubal ligation AGE 87  . Chondroplasty 08/19/2011    Procedure: CHONDROPLASTY;  Surgeon: Drucilla Schmidt, MD;  Location: [facility];  Service: Orthopedics;;  shaving of medial femeral chondral  . Menisectomy 08/19/2011    Procedure: MENISECTOMY;  Surgeon: Drucilla Schmidt, MD;  Location: [facility];  Service: Orthopedics;;  partial lateral menisectomy    No family history on file.  History  Substance Use Topics  . Smoking status: Former Smoker -- 5 years    Types: Cigarettes    Quit date: 08/13/1986  . Smokeless tobacco: Never Used  . Alcohol Use: Yes     OCC.    OB History    Grav Para Term Preterm Abortions TAB SAB Ect Mult Living                  Review of Systems  Constitutional: Negative.   HENT: Positive for rhinorrhea.   Respiratory: Positive for cough. Negative for shortness of breath and wheezing.   Cardiovascular:  Positive for chest pain.    Allergies  Review of patient's allergies indicates no known allergies.  Home Medications   Current Outpatient Rx  Name Route Sig Dispense Refill  . ONE-DAILY MULTI VITAMINS PO TABS Oral Take 1 tablet by mouth daily.    Marland Kitchen HYDROCOD POLST-CPM POLST ER 10-8 MG/5ML PO LQCR Oral Take 5 mLs by mouth every 12 (twelve) hours. 115 mL 0  . FLUTICASONE PROPIONATE 50 MCG/ACT NA SUSP Nasal Place 2 sprays into the nose daily. 1 g 2  . MELOXICAM 7.5 MG PO TABS Oral Take 7.5 mg by mouth as needed.      BP 131/83  Pulse 100  Temp 98.3 F (36.8 C)  Resp 16  Ht 5\' 4"  (1.626 m)  Wt 300 lb (136.079 kg)  BMI 51.49 kg/m2  SpO2 95%  Physical Exam  Nursing note and vitals reviewed. Constitutional: She is oriented to person, place, and time. She appears well-developed and well-nourished.  HENT:  Head: Normocephalic.  Right Ear: External ear normal.  Left Ear: External ear normal.  Mouth/Throat: Oropharynx is clear and moist.  Neck: Normal range of motion. Neck supple.  Pulmonary/Chest: Effort normal and breath sounds normal. She exhibits tenderness.  Lymphadenopathy:    She has no cervical adenopathy.  Neurological: She is alert and oriented to person, place, and time.  Skin: Skin is warm and dry.    ED Course  Procedures (  including critical care time)  Labs Reviewed - No data to display No results found.   1. URI (upper respiratory infection)   2. Musculoskeletal chest pain       MDM          Linna Hoff, MD 09/25/11 [phone]

## 2011-09-25 NOTE — ED Notes (Addendum)
Pt states she is having upper resp. problem. Ears are tingling. Not a lot of drainage but throat feels as if something is in it. Coughing stared 2 week ago. Zyrtec started when cough began. Pt denies fever. Feeling tired and drained.

## 2011-11-27 ENCOUNTER — Other Ambulatory Visit: Payer: Self-pay | Admitting: Otolaryngology

## 2011-11-27 ENCOUNTER — Ambulatory Visit
Admission: RE | Admit: 2011-11-27 | Discharge: 2011-11-27 | Disposition: A | Discharge status: Home / Self Care | Payer: PRIVATE HEALTH INSURANCE | Source: Ambulatory Visit | Attending: Otolaryngology | Admitting: Otolaryngology

## 2011-11-27 DIAGNOSIS — K219 Gastro-esophageal reflux disease without esophagitis: Secondary | ICD-10-CM

## 2011-12-07 ENCOUNTER — Ambulatory Visit (INDEPENDENT_AMBULATORY_CARE_PROVIDER_SITE_OTHER): Payer: PRIVATE HEALTH INSURANCE | Admitting: Physician Assistant

## 2011-12-07 VITALS — BP 117/78 | HR 103 | Temp 98.6°F | Resp 20 | Ht 62.5 in | Wt 321.0 lb

## 2011-12-07 DIAGNOSIS — R062 Wheezing: Secondary | ICD-10-CM

## 2011-12-07 DIAGNOSIS — J069 Acute upper respiratory infection, unspecified: Secondary | ICD-10-CM

## 2011-12-07 DIAGNOSIS — R059 Cough, unspecified: Secondary | ICD-10-CM

## 2011-12-07 DIAGNOSIS — R05 Cough: Secondary | ICD-10-CM

## 2011-12-07 MED ORDER — CEFDINIR 300 MG PO CAPS: 600.0000 mg | ORAL_CAPSULE | Freq: Every day | ORAL | Status: DC

## 2011-12-07 MED ORDER — GUAIFENESIN ER 1200 MG PO TB12: 1.0000 | ORAL_TABLET | Freq: Two times a day (BID) | ORAL | Status: DC | PRN

## 2011-12-07 MED ORDER — HYDROCOD POLST-CHLORPHEN POLST 10-8 MG/5ML PO LQCR: 5.0000 mL | Freq: Two times a day (BID) | ORAL | Status: DC | PRN

## 2011-12-07 MED ORDER — ALBUTEROL SULFATE HFA 108 (90 BASE) MCG/ACT IN AERS: 2.0000 | INHALATION_SPRAY | RESPIRATORY_TRACT | Status: DC | PRN

## 2011-12-07 MED ORDER — IPRATROPIUM BROMIDE 0.03 % NA SOLN: 2.0000 | Freq: Two times a day (BID) | NASAL | Status: DC

## 2011-12-07 NOTE — Patient Instructions (Signed)
Get LOTS of rest and drink at least 64 ounces of water daily. 

## 2011-12-07 NOTE — Progress Notes (Signed)
  Subjective:    Patient ID: Sandra Wood, female    DOB: 05/26/1956, 56 y.o.   MRN: [id number]  HPI 3 days of cough, productive of yellow phlegm, same as what she's blowing from her nose. Ear fullness.  Chest and back hurt with coughing.  Chills, no fever.  Mild diarrhea yesterday. No other myalgias.     Review of Systems as above.    Objective:   Physical Exam  Vital signs noted. Well-developed, well nourished WF who is awake, alert and oriented, in NAD. HEENT: [place]/AT, PERRL, EOMI.  Sclera and conjunctiva are clear.  EAC are patent, TMs are normal in appearance. Nasal mucosa is pink and moist. OP is clear. Neck: supple, non-tender, no lymphadenopathy, thyromegaly. Heart: RRR, no murmur Lungs: high-pitched musical wheezes in the upper lung fields bilateral. Abdomen: normo-active bowel sounds, supple, non-tender, no mass or organomegaly. Extremities: no cyanosis, clubbing or edema. Skin: warm and dry without rash.     Assessment & Plan:   1. Acute upper respiratory infections of unspecified site  Guaifenesin (MUCINEX MAXIMUM STRENGTH) 1200 MG TB12, ipratropium (ATROVENT) 0.03 % nasal spray, cefdinir (OMNICEF) 300 MG capsule  2. Cough  chlorpheniramine-HYDROcodone (TUSSIONEX PENNKINETIC ER) 10-8 MG/5ML LQCR  3. Wheezing  albuterol (PROVENTIL HFA;VENTOLIN HFA) 108 (90 BASE) MCG/ACT inhaler   Patient Instructions  Get LOTS of rest and drink at least 64 ounces of water daily.

## 2011-12-14 ENCOUNTER — Telehealth: Payer: Self-pay | Admitting: Internal Medicine

## 2011-12-14 NOTE — Telephone Encounter (Signed)
Gave pt appt:   12/16/11 @ 2p with Dr Lehman Prom

## 2011-12-14 NOTE — Telephone Encounter (Signed)
Pt desire switching to Dr Yetta Barre from Dr Jonny Ruiz because she don't particular care to see Dr Jonny Ruiz per pt.  Thank you both for your reply---

## 2011-12-14 NOTE — Telephone Encounter (Signed)
Pt's mother Miquel Dunn (a pt of Dr. Yetta Barre) also called to request to switch.  He daughter has been out of town and is sick with possible Pneumonia and would like to be seen early this week.

## 2011-12-14 NOTE — Telephone Encounter (Signed)
Ok with me 

## 2011-12-14 NOTE — Telephone Encounter (Signed)
ok 

## 2011-12-16 ENCOUNTER — Ambulatory Visit (INDEPENDENT_AMBULATORY_CARE_PROVIDER_SITE_OTHER): Payer: PRIVATE HEALTH INSURANCE | Admitting: Internal Medicine

## 2011-12-16 ENCOUNTER — Ambulatory Visit (INDEPENDENT_AMBULATORY_CARE_PROVIDER_SITE_OTHER)
Admission: RE | Admit: 2011-12-16 | Discharge: 2011-12-16 | Disposition: A | Discharge status: Home / Self Care | Payer: PRIVATE HEALTH INSURANCE | Source: Ambulatory Visit | Attending: Internal Medicine | Admitting: Internal Medicine

## 2011-12-16 ENCOUNTER — Encounter: Payer: Self-pay | Admitting: Internal Medicine

## 2011-12-16 VITALS — BP 104/68 | HR 100 | Temp 97.8°F | Resp 20 | Wt 326.0 lb

## 2011-12-16 DIAGNOSIS — J189 Pneumonia, unspecified organism: Secondary | ICD-10-CM

## 2011-12-16 DIAGNOSIS — R05 Cough: Secondary | ICD-10-CM

## 2011-12-16 DIAGNOSIS — J45909 Unspecified asthma, uncomplicated: Secondary | ICD-10-CM

## 2011-12-16 DIAGNOSIS — R059 Cough, unspecified: Secondary | ICD-10-CM

## 2011-12-16 DIAGNOSIS — J45901 Unspecified asthma with (acute) exacerbation: Secondary | ICD-10-CM | POA: Insufficient documentation

## 2011-12-16 MED ORDER — METHYLPREDNISOLONE ACETATE 80 MG/ML IJ SUSP
120.0000 mg | Freq: Once | INTRAMUSCULAR | Status: AC
  Administered 2011-12-16: 120 mg via INTRAMUSCULAR

## 2011-12-16 MED ORDER — HYDROCODONE-HOMATROPINE 5-1.5 MG/5ML PO SYRP: 5.0000 mL | ORAL_SOLUTION | ORAL | Status: DC | PRN

## 2011-12-16 NOTE — Assessment & Plan Note (Signed)
I will recheck her CXR today to see if she has any complications like abscess or empyema

## 2011-12-16 NOTE — Progress Notes (Signed)
Subjective:    Patient ID: Sandra Wood, female    DOB: 06-23-1956, 56 y.o.   MRN: [id number]  Cough This is a recurrent problem. The current episode started 1 to 4 weeks ago. The problem has been gradually worsening. The problem occurs every few hours. The cough is productive of purulent sputum. Associated symptoms include chills, shortness of breath, sweats and wheezing. Pertinent negatives include no chest pain, ear congestion, ear pain, fever, headaches, heartburn, hemoptysis, myalgias, nasal congestion, postnasal drip, rash, rhinorrhea, sore throat or weight loss. Nothing aggravates the symptoms. She has tried prescription cough suppressant (omnicef and levaquin) for the symptoms. The treatment provided moderate relief. Her past medical history is significant for asthma and pneumonia. she was seen in the ER in [place] 5 d ago and CXR was + for  PNA  Pneumonia She complains of cough, shortness of breath, sputum production and wheezing. There is no chest tightness, difficulty breathing or hemoptysis. This is a recurrent problem. The current episode started 1 to 4 weeks ago. The problem occurs intermittently. Associated symptoms include sweats. Pertinent negatives include no appetite change, chest pain, ear congestion, ear pain, fever, headaches, heartburn, myalgias, nasal congestion, postnasal drip, rhinorrhea, sneezing, sore throat, trouble swallowing or weight loss. Her past medical history is significant for asthma and pneumonia.      Review of Systems  Constitutional: Positive for chills and fatigue. Negative for fever, weight loss, diaphoresis, activity change, appetite change and unexpected weight change.  HENT: Negative for ear pain, nosebleeds, congestion, sore throat, rhinorrhea, sneezing, trouble swallowing, voice change, postnasal drip and sinus pressure.   Eyes: Negative.   Respiratory: Positive for cough, sputum production, shortness of breath and wheezing. Negative for  hemoptysis, choking, chest tightness and stridor.   Cardiovascular: Negative for chest pain, palpitations and leg swelling.  Gastrointestinal: Negative.  Negative for heartburn.  Genitourinary: Negative.   Musculoskeletal: Negative.  Negative for myalgias.  Skin: Negative for color change, rash and wound.  Neurological: Negative.  Negative for headaches.  Hematological: Negative for adenopathy. Does not bruise/bleed easily.  Psychiatric/Behavioral: Negative.        Objective:   Physical Exam  Vitals reviewed. Constitutional: She is oriented to person, place, and time. She appears well-developed and well-nourished.  Non-toxic appearance. She does not have a sickly appearance. She does not appear ill. No distress.  HENT:  Head: Normocephalic and atraumatic.  Mouth/Throat: Oropharynx is clear and moist. No oropharyngeal exudate.  Eyes: Conjunctivae are normal. Right eye exhibits no discharge. Left eye exhibits no discharge. No scleral icterus.  Neck: Normal range of motion. Neck supple. No JVD present. No tracheal deviation present. No thyromegaly present.  Cardiovascular: Normal rate, regular rhythm, normal heart sounds and intact distal pulses.  Exam reveals no gallop and no friction rub.   No murmur heard. Pulmonary/Chest: Effort normal. No accessory muscle usage or stridor. Not tachypneic. No respiratory distress. She has no decreased breath sounds. She has no wheezes. She has rhonchi in the left lower field. She has no rales.  Abdominal: Soft. Bowel sounds are normal. She exhibits no distension and no mass. There is no tenderness. There is no rebound and no guarding.  Musculoskeletal: Normal range of motion. She exhibits no edema and no tenderness.  Lymphadenopathy:    She has no cervical adenopathy.  Neurological: She is oriented to person, place, and time.  Skin: Skin is warm and dry. No rash noted. She is not diaphoretic. No erythema. No pallor.  Psychiatric: She has a  normal mood  and affect. Her behavior is normal. Judgment and thought content normal.          Assessment & Plan:

## 2011-12-16 NOTE — Assessment & Plan Note (Signed)
I think she has had adequate antibiotic coverage and that her symptoms now are predominantly post-infectious inflammation so I gave her an injection for depo-medrol IM and she can continue taking a cough suppressant, if her CXR today shows worsening PNA or complications that I will address that.

## 2011-12-16 NOTE — Assessment & Plan Note (Signed)
She was given a dose of depo-medrol IM to reduce the inflammation and wheezing

## 2011-12-16 NOTE — Patient Instructions (Signed)

## 2011-12-25 ENCOUNTER — Ambulatory Visit: Payer: Self-pay | Admitting: *Deleted

## 2011-12-29 ENCOUNTER — Encounter: Payer: Self-pay | Admitting: Internal Medicine

## 2011-12-29 DIAGNOSIS — M19079 Primary osteoarthritis, unspecified ankle and foot: Secondary | ICD-10-CM | POA: Insufficient documentation

## 2011-12-29 DIAGNOSIS — Z0000 Encounter for general adult medical examination without abnormal findings: Secondary | ICD-10-CM | POA: Insufficient documentation

## 2011-12-30 ENCOUNTER — Ambulatory Visit (INDEPENDENT_AMBULATORY_CARE_PROVIDER_SITE_OTHER): Payer: PRIVATE HEALTH INSURANCE | Admitting: Internal Medicine

## 2011-12-30 ENCOUNTER — Encounter: Payer: Self-pay | Admitting: Internal Medicine

## 2011-12-30 ENCOUNTER — Ambulatory Visit: Payer: PRIVATE HEALTH INSURANCE | Admitting: Internal Medicine

## 2011-12-30 VITALS — BP 116/70 | HR 93 | Temp 98.4°F | Resp 16 | Wt 323.0 lb

## 2011-12-30 DIAGNOSIS — K219 Gastro-esophageal reflux disease without esophagitis: Secondary | ICD-10-CM | POA: Insufficient documentation

## 2011-12-30 DIAGNOSIS — Z23 Encounter for immunization: Secondary | ICD-10-CM

## 2011-12-30 DIAGNOSIS — J45901 Unspecified asthma with (acute) exacerbation: Secondary | ICD-10-CM

## 2011-12-30 MED ORDER — OMEPRAZOLE 20 MG PO CPDR: 20.0000 mg | DELAYED_RELEASE_CAPSULE | Freq: Every day | ORAL | Status: DC

## 2011-12-30 MED ORDER — FLUTICASONE-SALMETEROL 250-50 MCG/DOSE IN AEPB: 1.0000 | INHALATION_SPRAY | Freq: Two times a day (BID) | RESPIRATORY_TRACT | Status: DC

## 2011-12-30 NOTE — Assessment & Plan Note (Signed)
Continue omeprazole 

## 2011-12-30 NOTE — Patient Instructions (Signed)

## 2011-12-30 NOTE — Progress Notes (Signed)
  Subjective:    Patient ID: Sandra Wood, female    DOB: 03-Jul-1955, 56 y.o.   MRN: [id number]  Cough This is a chronic problem. Episode onset: for about 9 months. The problem has been gradually improving. The cough is non-productive. Associated symptoms include nasal congestion, postnasal drip, rhinorrhea and wheezing. Pertinent negatives include no chest pain, chills, ear congestion, ear pain, fever, headaches, heartburn, hemoptysis, myalgias, rash, sore throat, shortness of breath, sweats or weight loss. Nothing aggravates the symptoms. She has tried prescription cough suppressant and a beta-agonist inhaler for the symptoms. The treatment provided significant relief. Her past medical history is significant for asthma, bronchitis and pneumonia.      Review of Systems  Constitutional: Negative for fever, chills, weight loss, diaphoresis, activity change, appetite change, fatigue and unexpected weight change.  HENT: Positive for rhinorrhea and postnasal drip. Negative for hearing loss, ear pain, nosebleeds, congestion, sore throat, facial swelling, sneezing, drooling, mouth sores, trouble swallowing, neck pain, neck stiffness, dental problem, voice change, sinus pressure, tinnitus and ear discharge.   Eyes: Negative.   Respiratory: Positive for cough and wheezing. Negative for apnea, hemoptysis, choking, chest tightness, shortness of breath and stridor.   Cardiovascular: Negative for chest pain, palpitations and leg swelling.  Gastrointestinal: Negative.  Negative for heartburn.  Genitourinary: Negative.   Musculoskeletal: Negative.  Negative for myalgias.  Skin: Negative.  Negative for rash.  Neurological: Negative.  Negative for headaches.  Hematological: Negative.  Negative for adenopathy. Does not bruise/bleed easily.  Psychiatric/Behavioral: Negative.        Objective:   Physical Exam  Vitals reviewed. Constitutional: She is oriented to person, place, and time. She appears  well-developed and well-nourished. No distress.  HENT:  Head: Normocephalic and atraumatic.  Mouth/Throat: Oropharynx is clear and moist. No oropharyngeal exudate.  Eyes: Conjunctivae are normal. Right eye exhibits no discharge. Left eye exhibits no discharge. No scleral icterus.  Neck: Normal range of motion. Neck supple. No JVD present. No tracheal deviation present. No thyromegaly present.  Cardiovascular: Normal rate, regular rhythm, normal heart sounds and intact distal pulses.  Exam reveals no gallop and no friction rub.   No murmur heard. Pulmonary/Chest: Effort normal and breath sounds normal. No accessory muscle usage or stridor. Not tachypneic. No respiratory distress. She has no decreased breath sounds. She has no wheezes. She has no rhonchi. She has no rales. She exhibits no tenderness.  Abdominal: Soft. Bowel sounds are normal. She exhibits no distension and no mass. There is no tenderness. There is no rebound and no guarding.  Musculoskeletal: Normal range of motion. She exhibits no edema and no tenderness.  Lymphadenopathy:    She has no cervical adenopathy.  Neurological: She is oriented to person, place, and time.  Skin: Skin is warm and dry. No rash noted. She is not diaphoretic. No erythema. No pallor.  Psychiatric: She has a normal mood and affect. Her behavior is normal. Judgment and thought content normal.      Dg Chest 2 View  12/16/2011  *RADIOLOGY REPORT*  Clinical Data: Wheezing and cough.  CHEST - 2 VIEW  Comparison: PA and lateral chest 11/27/2011.  Findings: There is some peribronchial thickening.  No consolidative process, pneumothorax or effusion.  Heart size is normal.  No focal bony abnormality.  IMPRESSION: Bronchitic change without focal process.  Original Report Authenticated By: Bernadene Bell. Maricela Curet, M.D.     Assessment & Plan:

## 2011-12-30 NOTE — Assessment & Plan Note (Signed)
Overall she is improving, I think she needs an ICS and LABA so have started her on advair

## 2012-01-27 ENCOUNTER — Ambulatory Visit (INDEPENDENT_AMBULATORY_CARE_PROVIDER_SITE_OTHER): Payer: PRIVATE HEALTH INSURANCE | Admitting: Internal Medicine

## 2012-01-27 ENCOUNTER — Encounter: Payer: Self-pay | Admitting: Internal Medicine

## 2012-01-27 VITALS — BP 130/82 | HR 94 | Temp 98.3°F | Resp 16 | Wt 323.0 lb

## 2012-01-27 DIAGNOSIS — J4530 Mild persistent asthma, uncomplicated: Secondary | ICD-10-CM | POA: Insufficient documentation

## 2012-01-27 DIAGNOSIS — J209 Acute bronchitis, unspecified: Secondary | ICD-10-CM

## 2012-01-27 DIAGNOSIS — J45901 Unspecified asthma with (acute) exacerbation: Secondary | ICD-10-CM

## 2012-01-27 MED ORDER — AZITHROMYCIN 500 MG PO TABS: 500.0000 mg | ORAL_TABLET | Freq: Every day | ORAL | Status: AC

## 2012-01-27 NOTE — Assessment & Plan Note (Signed)
This appears to be well controlled with her inhalers

## 2012-01-27 NOTE — Patient Instructions (Signed)

## 2012-01-27 NOTE — Assessment & Plan Note (Signed)
She tells me that she has been exposed to whooping cough so I asked her to start Zpak

## 2012-01-27 NOTE — Progress Notes (Signed)
  Subjective:    Patient ID: TRU LEOPARD, female    DOB: 26-Jun-1956, 56 y.o.   MRN: [id number]  Cough The current episode started in the past 7 days. The problem has been gradually worsening. The problem occurs every few hours. The cough is productive of purulent sputum. Associated symptoms include chills and a sore throat. Pertinent negatives include no chest pain, ear congestion, ear pain, fever, headaches, heartburn, hemoptysis, myalgias, nasal congestion, postnasal drip, rash, rhinorrhea, shortness of breath, sweats, weight loss or wheezing. Risk factors for lung disease include occupational exposure. She has tried a beta-agonist inhaler and steroid inhaler for the symptoms. The treatment provided significant relief. Her past medical history is significant for asthma.      Review of Systems  Constitutional: Positive for chills. Negative for fever, weight loss, diaphoresis, activity change, appetite change, fatigue and unexpected weight change.  HENT: Positive for sore throat. Negative for ear pain, rhinorrhea and postnasal drip.   Eyes: Negative.   Respiratory: Positive for cough. Negative for apnea, hemoptysis, choking, chest tightness, shortness of breath, wheezing and stridor.   Cardiovascular: Negative for chest pain, palpitations and leg swelling.  Gastrointestinal: Negative for heartburn, nausea, vomiting, abdominal pain, diarrhea, constipation, blood in stool and abdominal distention.  Genitourinary: Negative.   Musculoskeletal: Negative.  Negative for myalgias.  Skin: Negative for color change, pallor, rash and wound.  Neurological: Negative.  Negative for headaches.  Hematological: Negative for adenopathy. Does not bruise/bleed easily.  Psychiatric/Behavioral: Negative.        Objective:   Physical Exam  Vitals reviewed. Constitutional: She is oriented to person, place, and time. She appears well-developed and well-nourished.  Non-toxic appearance. She does not have a  sickly appearance. She does not appear ill. No distress.  HENT:  Head: Normocephalic and atraumatic.  Mouth/Throat: Oropharynx is clear and moist. No oropharyngeal exudate.  Eyes: Conjunctivae are normal. Right eye exhibits no discharge. Left eye exhibits no discharge. No scleral icterus.  Neck: Normal range of motion. Neck supple. No JVD present. No tracheal deviation present. No thyromegaly present.  Cardiovascular: Normal rate, regular rhythm, normal heart sounds and intact distal pulses.  Exam reveals no gallop and no friction rub.   No murmur heard. Pulmonary/Chest: Effort normal and breath sounds normal. No accessory muscle usage or stridor. Not tachypneic. No respiratory distress. She has no decreased breath sounds. She has no wheezes. She has no rhonchi. She has no rales. She exhibits no tenderness.  Abdominal: Soft. Bowel sounds are normal. She exhibits no distension and no mass. There is no tenderness. There is no rebound and no guarding.  Musculoskeletal: Normal range of motion. She exhibits no edema and no tenderness.  Lymphadenopathy:    She has no cervical adenopathy.  Neurological: She is oriented to person, place, and time.  Skin: Skin is warm and dry. No rash noted. She is not diaphoretic. No erythema. No pallor.  Psychiatric: She has a normal mood and affect. Her behavior is normal. Judgment and thought content normal.      Dg Chest 2 View  12/16/2011  *RADIOLOGY REPORT*  Clinical Data: Wheezing and cough.  CHEST - 2 VIEW  Comparison: PA and lateral chest 11/27/2011.  Findings: There is some peribronchial thickening.  No consolidative process, pneumothorax or effusion.  Heart size is normal.  No focal bony abnormality.  IMPRESSION: Bronchitic change without focal process.  Original Report Authenticated By: Bernadene Bell. Maricela Curet, M.D.     Assessment & Plan:

## 2012-03-31 ENCOUNTER — Other Ambulatory Visit (INDEPENDENT_AMBULATORY_CARE_PROVIDER_SITE_OTHER): Payer: PRIVATE HEALTH INSURANCE

## 2012-03-31 ENCOUNTER — Ambulatory Visit (INDEPENDENT_AMBULATORY_CARE_PROVIDER_SITE_OTHER): Payer: PRIVATE HEALTH INSURANCE | Admitting: Internal Medicine

## 2012-03-31 ENCOUNTER — Encounter: Payer: Self-pay | Admitting: Internal Medicine

## 2012-03-31 VITALS — BP 124/72 | HR 86 | Temp 98.4°F | Resp 16 | Ht 63.0 in | Wt 326.0 lb

## 2012-03-31 DIAGNOSIS — Z13220 Encounter for screening for lipoid disorders: Secondary | ICD-10-CM

## 2012-03-31 DIAGNOSIS — J4530 Mild persistent asthma, uncomplicated: Secondary | ICD-10-CM

## 2012-03-31 DIAGNOSIS — J45909 Unspecified asthma, uncomplicated: Secondary | ICD-10-CM

## 2012-03-31 DIAGNOSIS — IMO0001 Reserved for inherently not codable concepts without codable children: Secondary | ICD-10-CM

## 2012-03-31 DIAGNOSIS — Z23 Encounter for immunization: Secondary | ICD-10-CM

## 2012-03-31 LAB — CBC WITH DIFFERENTIAL/PLATELET
Eosinophils Relative: 2.3 % (ref 0.0–5.0)
MCV: 82.5 fl (ref 78.0–100.0)
Monocytes Absolute: 0.7 10*3/uL (ref 0.1–1.0)
Neutrophils Relative %: 71.2 % (ref 43.0–77.0)
Platelets: 379 10*3/uL (ref 150.0–400.0)
WBC: 10.5 10*3/uL (ref 4.5–10.5)

## 2012-03-31 LAB — LIPID PANEL
Cholesterol: 204 mg/dL — ABNORMAL HIGH (ref 0–200)
Total CHOL/HDL Ratio: 5
Triglycerides: 117 mg/dL (ref 0.0–149.0)
VLDL: 23.4 mg/dL (ref 0.0–40.0)

## 2012-03-31 LAB — COMPREHENSIVE METABOLIC PANEL
Albumin: 3.6 g/dL (ref 3.5–5.2)
Alkaline Phosphatase: 116 U/L (ref 39–117)
BUN: 12 mg/dL (ref 6–23)
CO2: 25 mEq/L (ref 19–32)
GFR: 91.51 mL/min (ref >60.00)
Glucose, Bld: 107 mg/dL — ABNORMAL HIGH (ref 70–99)
Potassium: 4 mEq/L (ref 3.5–5.1)
Total Bilirubin: 0.6 mg/dL (ref 0.3–1.2)
Total Protein: 7.8 g/dL (ref 6.0–8.3)

## 2012-03-31 LAB — LDL CHOLESTEROL, DIRECT: Direct LDL: 137.2 mg/dL

## 2012-03-31 MED ORDER — LORCASERIN HCL 10 MG PO TABS: 1.0000 | ORAL_TABLET | Freq: Two times a day (BID) | ORAL | Status: DC

## 2012-03-31 NOTE — Progress Notes (Signed)
  Subjective:    Patient ID: Sandra Wood, female    DOB: Aug 31, 1955, 56 y.o.   MRN: [id number]  Asthma She complains of wheezing. There is no chest tightness, cough, difficulty breathing, frequent throat clearing, hemoptysis, hoarse voice, shortness of breath or sputum production. This is a chronic problem. The problem occurs rarely. The problem has been gradually improving. Pertinent negatives include no appetite change, chest pain, dyspnea on exertion, ear congestion, ear pain, fever, headaches, heartburn, malaise/fatigue, myalgias, nasal congestion, orthopnea, PND, postnasal drip, rhinorrhea, sneezing, sore throat, sweats, trouble swallowing or weight loss. Her symptoms are alleviated by beta-agonist and steroid inhaler. She reports significant improvement on treatment. Her past medical history is significant for asthma, bronchitis and pneumonia.      Review of Systems  Constitutional: Positive for unexpected weight change (some weight gain). Negative for fever, chills, weight loss, malaise/fatigue, diaphoresis, activity change, appetite change and fatigue.  HENT: Negative.  Negative for ear pain, sore throat, hoarse voice, rhinorrhea, sneezing, trouble swallowing, voice change and postnasal drip.   Eyes: Negative.   Respiratory: Positive for wheezing. Negative for apnea, cough, hemoptysis, sputum production, choking, chest tightness, shortness of breath and stridor.   Cardiovascular: Negative for chest pain, dyspnea on exertion, palpitations, leg swelling and PND.  Gastrointestinal: Negative.  Negative for heartburn.  Genitourinary: Negative.   Musculoskeletal: Negative.  Negative for myalgias.  Skin: Negative.   Neurological: Negative.  Negative for headaches.  Hematological: Negative for adenopathy. Does not bruise/bleed easily.       Objective:   Physical Exam  Vitals reviewed. Constitutional: She is oriented to person, place, and time. She appears well-developed and  well-nourished.  Non-toxic appearance. She does not have a sickly appearance. She does not appear ill. No distress.  HENT:  Head: Normocephalic and atraumatic.  Mouth/Throat: Oropharynx is clear and moist.  Eyes: Conjunctivae normal are normal. Right eye exhibits no discharge. Left eye exhibits no discharge. No scleral icterus.  Neck: Normal range of motion. Neck supple. No JVD present. No tracheal deviation present. No thyromegaly present.  Cardiovascular: Normal rate, regular rhythm, normal heart sounds and intact distal pulses.  Exam reveals no gallop and no friction rub.   No murmur heard. Pulmonary/Chest: Effort normal and breath sounds normal. No accessory muscle usage or stridor. Not tachypneic. No respiratory distress. She has no decreased breath sounds. She has no wheezes. She has no rhonchi. She has no rales. She exhibits no tenderness.  Abdominal: Soft. Bowel sounds are normal. She exhibits no distension and no mass. There is no tenderness. There is no rebound and no guarding.  Musculoskeletal: Normal range of motion. She exhibits no edema and no tenderness.  Lymphadenopathy:    She has no cervical adenopathy.  Neurological: She is oriented to person, place, and time.  Skin: Skin is warm and dry. No rash noted. She is not diaphoretic. No erythema. No pallor.  Psychiatric: She has a normal mood and affect. Her behavior is normal. Judgment and thought content normal.     Lab Results  Component Value Date   WBC 9.2 07/09/2006   HGB 14.4 08/19/2011   HCT 41.1 07/09/2006   PLT 394 07/09/2006   INR 1.1 RATIO 07/09/2006       Assessment & Plan:

## 2012-03-31 NOTE — Patient Instructions (Signed)

## 2012-04-03 DIAGNOSIS — E1165 Type 2 diabetes mellitus with hyperglycemia: Secondary | ICD-10-CM | POA: Insufficient documentation

## 2012-04-03 DIAGNOSIS — IMO0002 Reserved for concepts with insufficient information to code with codable children: Secondary | ICD-10-CM | POA: Insufficient documentation

## 2012-04-03 NOTE — Assessment & Plan Note (Signed)
She is doing well on her current regimen 

## 2012-04-03 NOTE — Assessment & Plan Note (Signed)
Her a1c = 6.6

## 2012-04-03 NOTE — Assessment & Plan Note (Signed)
I will check her labs today to look for co-morbid conditions like DM, hyperlipidemia, fatty live, etc. I asked her to try belviq to help control her appetite, she agrees to exercise more.

## 2012-05-04 ENCOUNTER — Ambulatory Visit (INDEPENDENT_AMBULATORY_CARE_PROVIDER_SITE_OTHER): Payer: PRIVATE HEALTH INSURANCE | Admitting: Internal Medicine

## 2012-05-04 ENCOUNTER — Encounter: Payer: Self-pay | Admitting: Internal Medicine

## 2012-05-04 VITALS — BP 112/72 | HR 94 | Temp 98.0°F | Ht 64.0 in | Wt 325.4 lb

## 2012-05-04 DIAGNOSIS — H1010 Acute atopic conjunctivitis, unspecified eye: Secondary | ICD-10-CM

## 2012-05-04 DIAGNOSIS — J4530 Mild persistent asthma, uncomplicated: Secondary | ICD-10-CM

## 2012-05-04 DIAGNOSIS — J45909 Unspecified asthma, uncomplicated: Secondary | ICD-10-CM

## 2012-05-04 DIAGNOSIS — H1045 Other chronic allergic conjunctivitis: Secondary | ICD-10-CM

## 2012-05-04 DIAGNOSIS — J309 Allergic rhinitis, unspecified: Secondary | ICD-10-CM | POA: Insufficient documentation

## 2012-05-04 DIAGNOSIS — H00026 Hordeolum internum left eye, unspecified eyelid: Secondary | ICD-10-CM | POA: Insufficient documentation

## 2012-05-04 MED ORDER — FEXOFENADINE HCL 180 MG PO TABS: 180.0000 mg | ORAL_TABLET | Freq: Every day | ORAL | Status: DC

## 2012-05-04 MED ORDER — FLUTICASONE PROPIONATE 50 MCG/ACT NA SUSP: 2.0000 | Freq: Every day | NASAL | Status: DC

## 2012-05-04 MED ORDER — PREDNISONE 10 MG PO TABS: ORAL_TABLET | ORAL | Status: DC

## 2012-05-04 MED ORDER — METHYLPREDNISOLONE ACETATE 80 MG/ML IJ SUSP
120.0000 mg | Freq: Once | INTRAMUSCULAR | Status: AC
  Administered 2012-05-04: 120 mg via INTRAMUSCULAR

## 2012-05-04 NOTE — Patient Instructions (Addendum)
You had the steroid shot today Take all new medications as prescribed - the prednisone Please use the Dymista sample at 1 spray per nostril, twice per day until finished (1 week) Then start the allegra and flonase to hopefully keep the symptoms from coming back Please see Dr Yetta Barre in 2-3 wks if not better

## 2012-05-04 NOTE — Assessment & Plan Note (Signed)
stable overall by hx and exam, most recent data reviewed with pt, and pt to continue medical treatment as before SpO2 Readings from Last 3 Encounters:  05/04/12 95%  03/31/12 92%  01/27/12 96%

## 2012-05-04 NOTE — Assessment & Plan Note (Signed)
stable overall by hx and exam,, and pt to continue medical treatment as before, cont monitor cbgs on steroid tx, call for > 200, or onset polys

## 2012-05-04 NOTE — Assessment & Plan Note (Signed)
Gave sample dymista x 1 wk, then for allegra/flonase after ,  to f/u any worsening symptoms or concerns

## 2012-05-04 NOTE — Assessment & Plan Note (Signed)
Mild to mod, for depomedrol IM, and predpack, to f/u any worsening symptoms or concerns

## 2012-05-04 NOTE — Progress Notes (Signed)
Subjective:    Patient ID: Sandra Wood, female    DOB: October 13, 1955, 56 y.o.   MRN: [id number]  HPI  Here with 3 days sudden worsening onset bilat eye itching, swelling, clearish d/c, nasal congestion with clear d/c that seemed to start working at her computer at the desk at work, now with erythema swelling to the forehead above the eyes;  Without fever, pain, HA, ST, and Pt denies chest pain, increased sob or doe, wheezing, orthopnea, PND, increased LE swelling, palpitations, dizziness or syncope.  Has had minor cough worse to lie down at night.  Has only had what seems to be allergy symptoms since the spring of this yr, new for her without significant need for tx prior.   Pt denies fever, wt loss, night sweats, loss of appetite, or other constitutional symptoms.  + Hx of DM Past Medical History  Diagnosis Date  . Tear of medial meniscus of knee LEFT  . Arthritis   . Arthritis of ankle BILATERAL / HX FX'S  . Asthma, mild persistent 01/27/2012  . Obesity, morbid 03/31/2012  . GERD (gastroesophageal reflux disease) 12/30/2011  . Type II or unspecified type diabetes mellitus without mention of complication, uncontrolled 04/03/2012   Past Surgical History  Procedure Date  . Knee arthroscopy w/ meniscectomy 07-07-2010  . Hysteroscopy w/d&c 2008  . Tubal ligation AGE 79  . Chondroplasty 08/19/2011    Procedure: CHONDROPLASTY;  Surgeon: Drucilla Schmidt, MD;  Location: [facility];  Service: Orthopedics;;  shaving of medial femeral chondral  . Menisectomy 08/19/2011    Procedure: MENISECTOMY;  Surgeon: Drucilla Schmidt, MD;  Location: [facility];  Service: Orthopedics;;  partial lateral menisectomy    reports that she quit smoking about 25 years ago. Her smoking use included Cigarettes. She quit after 5 years of use. She has never used smokeless tobacco. She reports that she does not drink alcohol or use illicit drugs. family history includes Cancer (age of onset:56)  in her brother; Diabetes in her mother; and Heart disease in her father. No Known Allergies Current Outpatient Prescriptions on File Prior to Visit  Medication Sig Dispense Refill  . albuterol (PROVENTIL HFA;VENTOLIN HFA) 108 (90 BASE) MCG/ACT inhaler Inhale 2 puffs into the lungs every 4 (four) hours as needed for wheezing (cough, shortness of breath or wheezing.).  1 Inhaler  1  . Fluticasone-Salmeterol (ADVAIR DISKUS) 250-50 MCG/DOSE AEPB Inhale 1 puff into the lungs 2 (two) times daily.  120 each  0  . Guaifenesin (MUCINEX MAXIMUM STRENGTH) 1200 MG TB12 Take 1 tablet (1,200 mg total) by mouth every 12 (twelve) hours as needed.  14 tablet  1  . ipratropium (ATROVENT) 0.03 % nasal spray Place 2 sprays into the nose 2 (two) times daily.  30 mL  0  . Lorcaserin HCl (BELVIQ) 10 MG TABS Take 1 tablet by mouth 2 (two) times daily.  60 tablet  5  . meloxicam (MOBIC) 7.5 MG tablet Take 7.5 mg by mouth as needed.      Marland Kitchen omeprazole (PRILOSEC) 20 MG capsule Take 1 capsule (20 mg total) by mouth daily.  30 capsule  3  . fexofenadine (ALLEGRA) 180 MG tablet Take 1 tablet (180 mg total) by mouth daily.  30 tablet  11   No current facility-administered medications on file prior to visit.   Review of Systems  Constitutional: Negative for diaphoresis and unexpected weight change.  HENT: Negative for tinnitus.   Eyes: Negative for photophobia and  visual disturbance.  Respiratory: Negative for choking and stridor.   Gastrointestinal: Negative for vomiting and blood in stool.  Genitourinary: Negative for hematuria and decreased urine volume.  Musculoskeletal: Negative for gait problem.  Skin: Negative for color change and wound.  Neurological: Negative for tremors and numbness.  Psychiatric/Behavioral: Negative for decreased concentration. The patient is not hyperactive.       Objective:   Physical Exam BP 112/72  Pulse 94  Temp 98 F (36.7 C) (Oral)  Ht 5\' 4"  (1.626 m)  Wt 325 lb 6 oz (147.589 kg)   BMI 55.85 kg/m2  SpO2 95% Physical Exam  VS noted, not ill appearing Constitutional: Pt appears well-developed and well-nourished. /morbid obese HENT: Head: Normocephalic.  Right Ear: External ear normal.  Left Ear: External ear normal.  Eyes: Conjunctivae with bilat erythema, clearish d/c, puffiness nontender to upper and lower lids, and EOM are normal. Pupils are equal, round, and reactive to light.  Neck: Normal range of motion. Neck supple.  Cardiovascular: Normal rate and regular rhythm.   Pulmonary/Chest: Effort normal and breath sounds normal.  Neurological: Pt is alert. Not confused  Skin: Skin is warm. No erythema. mid forehead with 2cm area nondiscrete nontender erythema Psychiatric: Pt behavior is normal. Thought content normal.     Assessment & Plan:

## 2012-05-16 ENCOUNTER — Ambulatory Visit (INDEPENDENT_AMBULATORY_CARE_PROVIDER_SITE_OTHER): Payer: PRIVATE HEALTH INSURANCE | Admitting: Internal Medicine

## 2012-05-16 ENCOUNTER — Telehealth: Payer: Self-pay | Admitting: Internal Medicine

## 2012-05-16 ENCOUNTER — Encounter: Payer: Self-pay | Admitting: Internal Medicine

## 2012-05-16 VITALS — BP 128/70 | HR 86 | Temp 98.3°F | Ht 64.0 in

## 2012-05-16 DIAGNOSIS — H00019 Hordeolum externum unspecified eye, unspecified eyelid: Secondary | ICD-10-CM

## 2012-05-16 MED ORDER — BACITRACIN-POLYMYXIN B 500-10000 UNIT/GM OP OINT: TOPICAL_OINTMENT | Freq: Two times a day (BID) | OPHTHALMIC | Status: DC

## 2012-05-16 NOTE — Progress Notes (Signed)
  Subjective:    Patient ID: Sandra Wood, female    DOB: 11-May-1956, 56 y.o.   MRN: [id number]  HPI  complains of Left eye problem- ?infection associated with mild tenderness below eyelid Seen last week for same - improved x 72h following Medrol and pred rx   Past Medical History  Diagnosis Date  . Tear of medial meniscus of knee LEFT  . Arthritis   . Arthritis of ankle BILATERAL / HX FX'S  . Asthma, mild persistent   . Obesity, morbid   . GERD (gastroesophageal reflux disease)   . Type II or unspecified type diabetes mellitus without mention of complication, uncontrolled   . Allergic rhinitis, cause unspecified     Review of Systems  Constitutional: Negative for fever and fatigue.  Eyes: Positive for discharge. Negative for photophobia, pain, redness, itching and visual disturbance.  Neurological: Negative for facial asymmetry and headaches.       Objective:   Physical Exam BP 128/70  Pulse 86  Temp 98.3 F (36.8 C) (Oral)  Ht 5\' 4"  (1.626 m)  SpO2 92% Wt Readings from Last 3 Encounters:  05/04/12 325 lb 6 oz (147.589 kg)  03/31/12 326 lb (147.873 kg)  01/27/12 323 lb (146.512 kg)   Constitutional: She is overweight, but appears well-developed and well-nourished. No distress.  HENT: Head: Normocephalic and atraumatic. Ears: B TMs ok, no erythema or effusion; Nose: Nose normal. Mouth/Throat: Oropharynx is clear and moist. No oropharyngeal exudate.  Eyes: Sty on lower left eyelid with mild drainage/mtting -Conjunctivae and EOM are normal. Pupils are equal, round, and reactive to light. No scleral icterus.  Neck: Normal range of motion. Neck supple. No JVD present. No thyromegaly present.  Cardiovascular: Normal rate, regular rhythm and normal heart sounds.  No murmur heard. No BLE edema. Pulmonary/Chest: Effort normal and breath sounds normal. No respiratory distress. She has no wheezes.  Psychiatric: She has a normal mood and affect. Her behavior is normal.  Judgment and thought content normal.   Lab Results  Component Value Date   WBC 10.5 03/31/2012   HGB 14.2 03/31/2012   HCT 43.7 03/31/2012   PLT 379.0 03/31/2012   GLUCOSE 107* 03/31/2012   CHOL 204* 03/31/2012   TRIG 117.0 03/31/2012   HDL 42.60 03/31/2012   LDLDIRECT 137.2 03/31/2012   ALT 19 03/31/2012   AST 16 03/31/2012   NA 139 03/31/2012   K 4.0 03/31/2012   CL 106 03/31/2012   CREATININE 0.7 03/31/2012   BUN 12 03/31/2012   CO2 25 03/31/2012   TSH 2.77 03/31/2012   INR 1.1 RATIO 07/09/2006   HGBA1C 6.6* 03/31/2012       Assessment & Plan:   stye on L lower lid - no evidence for allergy at this time Last OV 05/04/12 reviewed antibiotics ointment and education provided To follow up with optho if still improved

## 2012-05-16 NOTE — Patient Instructions (Signed)
It was good to see you today. antibiotics ointment for your eye - Your prescription(s) have been submitted to your pharmacy. Please take as directed and contact our office if you believe you are having problem(s) with the medication(s). If still unimproved, call for refer to eye specialist as needed Sty A sty (hordeolum) is an infection of a gland in the eyelid located at the base of the eyelash. A sty may develop a white or yellow head of pus. It can be puffy (swollen). Usually, the sty will burst and pus will come out on its own. They do not leave lumps in the eyelid once they drain. A sty is often confused with another form of cyst of the eyelid called a chalazion. Chalazions occur within the eyelid and not on the edge where the bases of the eyelashes are. They often are red, sore and then form firm lumps in the eyelid. CAUSES    Germs (bacteria).   Lasting (chronic) eyelid inflammation.  SYMPTOMS    Tenderness, redness and swelling along the edge of the eyelid at the base of the eyelashes.   Sometimes, there is a white or yellow head of pus. It may or may not drain.  DIAGNOSIS   An ophthalmologist will be able to distinguish between a sty and a chalazion and treat the condition appropriately.   TREATMENT    Styes are typically treated with warm packs (compresses) until drainage occurs.   In rare cases, medicines that kill germs (antibiotics) may be prescribed. These antibiotics may be in the form of drops, cream or pills.   If a hard lump has formed, it is generally necessary to do a small incision and remove the hardened contents of the cyst in a minor surgical procedure done in the office.   In suspicious cases, your caregiver may send the contents of the cyst to the lab to be certain that it is not a rare, but dangerous form of cancer of the glands of the eyelid.  HOME CARE INSTRUCTIONS    Wash your hands often and dry them with a clean towel. Avoid touching your eyelid. This may  spread the infection to other parts of the eye.   Apply heat to your eyelid for 10 to 20 minutes, several times a day, to ease pain and help to heal it faster.   Do not squeeze the sty. Allow it to drain on its own. Wash your eyelid carefully 3 to 4 times per day to remove any pus.  SEEK IMMEDIATE MEDICAL CARE IF:    Your eye becomes painful or puffy (swollen).   Your vision changes.   Your sty does not drain by itself within 3 days.   Your sty comes back within a short period of time, even with treatment.   You have redness (inflammation) around the eye.   You have a fever.  Document Released: 03/25/2005 Document Revised: 09/07/2011 Document Reviewed: 11/27/2008 [facility] Patient Information 2013 [place], [place].

## 2012-05-16 NOTE — Telephone Encounter (Signed)
° °  Patient Information:  Caller Name: Dewayne  Phone: [phone]  Patient: Sandra Wood, Sandra Wood  Gender: Female  DOB: Jan 27, 1956  Age: 56 Years  PCP: Sanda Linger (Adults only)   Symptoms  Reason For Call & Symptoms: Pt took Prednisone x 7 days for an eye infection. Pt was seen on 05/04/12. Her sx have returned now that the prednisone is completed.  Reviewed Health History In EMR: Yes  Reviewed Medications In EMR: Yes  Reviewed Allergies In EMR: No  Date of Onset of Symptoms: 05/04/2012  Treatments Tried: dymysta samples complete/started Allegra and flonase  Treatments Tried Worked: No  Guideline(s) Used:  Eye - Pus or Discharge  Disposition Per Guideline:   Go to Office Now  Reason For Disposition Reached:   Eyelid (outer) is very red and painful (or tender to touch)  Advice Given:  N/A  Office Follow Up:  Does the office need to follow up with this patient?: No  Instructions For The Office: N/A

## 2012-06-02 ENCOUNTER — Ambulatory Visit (INDEPENDENT_AMBULATORY_CARE_PROVIDER_SITE_OTHER): Payer: PRIVATE HEALTH INSURANCE | Admitting: Internal Medicine

## 2012-06-02 ENCOUNTER — Encounter: Payer: Self-pay | Admitting: Internal Medicine

## 2012-06-02 VITALS — BP 124/70 | HR 93 | Temp 98.5°F | Resp 16 | Wt 324.0 lb

## 2012-06-02 DIAGNOSIS — IMO0002 Reserved for concepts with insufficient information to code with codable children: Secondary | ICD-10-CM

## 2012-06-02 DIAGNOSIS — H00026 Hordeolum internum left eye, unspecified eyelid: Secondary | ICD-10-CM

## 2012-06-02 DIAGNOSIS — Z23 Encounter for immunization: Secondary | ICD-10-CM

## 2012-06-02 DIAGNOSIS — Z0000 Encounter for general adult medical examination without abnormal findings: Secondary | ICD-10-CM | POA: Insufficient documentation

## 2012-06-02 DIAGNOSIS — H00029 Hordeolum internum unspecified eye, unspecified eyelid: Secondary | ICD-10-CM

## 2012-06-02 DIAGNOSIS — R87619 Unspecified abnormal cytological findings in specimens from cervix uteri: Secondary | ICD-10-CM | POA: Insufficient documentation

## 2012-06-02 DIAGNOSIS — Z2911 Encounter for prophylactic immunotherapy for respiratory syncytial virus (RSV): Secondary | ICD-10-CM

## 2012-06-02 DIAGNOSIS — Z1231 Encounter for screening mammogram for malignant neoplasm of breast: Secondary | ICD-10-CM

## 2012-06-02 NOTE — Patient Instructions (Signed)
Sty  A sty (hordeolum) is an infection of a gland in the eyelid located at the base of the eyelash. A sty may develop a white or yellow head of pus. It can be puffy (swollen). Usually, the sty will burst and pus will come out on its own. They do not leave lumps in the eyelid once they drain.  A sty is often confused with another form of cyst of the eyelid called a chalazion. Chalazions occur within the eyelid and not on the edge where the bases of the eyelashes are. They often are red, sore and then form firm lumps in the eyelid.  CAUSES    Germs (bacteria).   Lasting (chronic) eyelid inflammation.  SYMPTOMS    Tenderness, redness and swelling along the edge of the eyelid at the base of the eyelashes.   Sometimes, there is a white or yellow head of pus. It may or may not drain.  DIAGNOSIS   An ophthalmologist will be able to distinguish between a sty and a chalazion and treat the condition appropriately.   TREATMENT    Styes are typically treated with warm packs (compresses) until drainage occurs.   In rare cases, medicines that kill germs (antibiotics) may be prescribed. These antibiotics may be in the form of drops, cream or pills.   If a hard lump has formed, it is generally necessary to do a small incision and remove the hardened contents of the cyst in a minor surgical procedure done in the office.   In suspicious cases, your caregiver may send the contents of the cyst to the lab to be certain that it is not a rare, but dangerous form of cancer of the glands of the eyelid.  HOME CARE INSTRUCTIONS    Wash your hands often and dry them with a clean towel. Avoid touching your eyelid. This may spread the infection to other parts of the eye.   Apply heat to your eyelid for 10 to 20 minutes, several times a day, to ease pain and help to heal it faster.   Do not squeeze the sty. Allow it to drain on its own. Wash your eyelid carefully 3 to 4 times per day to remove any pus.  SEEK IMMEDIATE MEDICAL CARE IF:     Your eye becomes painful or puffy (swollen).   Your vision changes.   Your sty does not drain by itself within 3 days.   Your sty comes back within a short period of time, even with treatment.   You have redness (inflammation) around the eye.   You have a fever.  Document Released: 03/25/2005 Document Revised: 09/07/2011 Document Reviewed: 11/27/2008  [employer] Patient Information 2013 [employer].

## 2012-06-02 NOTE — Progress Notes (Signed)
Subjective:    Patient ID: Sandra Wood, female    DOB: 1955-10-24, 56 y.o.   MRN: [id number]  Conjunctivitis  The current episode started more than 2 weeks ago. The problem occurs occasionally. The problem has been unchanged. The problem is mild. Nothing relieves the symptoms. Associated symptoms include eye pain. Pertinent negatives include no fever, no decreased vision, no double vision, no eye itching, no photophobia, no congestion, no ear discharge, no ear pain, no headaches, no hearing loss, no mouth sores, no rhinorrhea, no sore throat, no stridor, no swollen glands, no neck pain, no cough, no URI, no wheezing, no rash, no eye discharge and no eye redness.      Review of Systems  Constitutional: Negative for fever, chills, diaphoresis, activity change, appetite change, fatigue and unexpected weight change.  HENT: Negative.  Negative for hearing loss, ear pain, congestion, sore throat, rhinorrhea, mouth sores, neck pain and ear discharge.   Eyes: Positive for pain. Negative for double vision, photophobia, discharge, redness, itching and visual disturbance.  Respiratory: Negative for apnea, cough, choking, chest tightness, wheezing and stridor.   Cardiovascular: Negative.   Gastrointestinal: Negative.   Genitourinary: Negative.   Musculoskeletal: Negative.   Skin: Negative for color change, pallor, rash and wound.  Neurological: Negative.  Negative for headaches.  Hematological: Negative for adenopathy. Does not bruise/bleed easily.  Psychiatric/Behavioral: Negative.        Objective:   Physical Exam  Vitals reviewed. Constitutional: She is oriented to person, place, and time. She appears well-developed and well-nourished. No distress.  HENT:  Head: Normocephalic and atraumatic.  Mouth/Throat: Oropharynx is clear and moist. No oropharyngeal exudate.  Eyes: EOM are normal. Pupils are equal, round, and reactive to light. Right eye exhibits no chemosis, no discharge, no  exudate and no hordeolum. No foreign body present in the right eye. Left eye exhibits hordeolum (very small, lower lid, internal, with mild erythema). Left eye exhibits no chemosis, no discharge and no exudate. No foreign body present in the left eye. Right conjunctiva is injected (lower lid, palpebral surface). Right conjunctiva has no hemorrhage. Left conjunctiva is injected (lower lid, palpebral surface). Left conjunctiva has no hemorrhage. No scleral icterus.    Neck: Normal range of motion. Neck supple. No JVD present. No tracheal deviation present. No thyromegaly present.  Cardiovascular: Normal rate, regular rhythm, normal heart sounds and intact distal pulses.  Exam reveals no gallop and no friction rub.   No murmur heard. Pulmonary/Chest: Effort normal and breath sounds normal. No stridor. No respiratory distress. She has no wheezes. She has no rales. She exhibits no tenderness.  Abdominal: Soft. Bowel sounds are normal. She exhibits no distension and no mass. There is no tenderness. There is no rebound and no guarding.  Musculoskeletal: Normal range of motion. She exhibits no edema and no tenderness.  Lymphadenopathy:    She has no cervical adenopathy.  Neurological: She is oriented to person, place, and time.  Skin: Skin is warm and dry. No rash noted. She is not diaphoretic. No erythema. No pallor.  Psychiatric: She has a normal mood and affect. Her behavior is normal. Judgment and thought content normal.      Lab Results  Component Value Date   WBC 10.5 03/31/2012   HGB 14.2 03/31/2012   HCT 43.7 03/31/2012   PLT 379.0 03/31/2012   GLUCOSE 107* 03/31/2012   CHOL 204* 03/31/2012   TRIG 117.0 03/31/2012   HDL 42.60 03/31/2012   LDLDIRECT 137.2 03/31/2012   ALT  19 03/31/2012   AST 16 03/31/2012   NA 139 03/31/2012   K 4.0 03/31/2012   CL 106 03/31/2012   CREATININE 0.7 03/31/2012   BUN 12 03/31/2012   CO2 25 03/31/2012   TSH 2.77 03/31/2012   INR 1.1 RATIO 07/09/2006   HGBA1C 6.6*  03/31/2012      Assessment & Plan:

## 2012-06-02 NOTE — Assessment & Plan Note (Signed)
She has tried eye meds I asked her to apply warm compresses and gave her pt ed material She was asked to see ophth to see if this needs to be drained

## 2012-06-30 ENCOUNTER — Encounter: Payer: PRIVATE HEALTH INSURANCE | Admitting: Obstetrics & Gynecology

## 2012-08-04 ENCOUNTER — Ambulatory Visit: Payer: PRIVATE HEALTH INSURANCE | Admitting: Internal Medicine

## 2013-07-05 ENCOUNTER — Encounter: Payer: Self-pay | Admitting: Internal Medicine

## 2013-07-05 ENCOUNTER — Other Ambulatory Visit (INDEPENDENT_AMBULATORY_CARE_PROVIDER_SITE_OTHER): Payer: PRIVATE HEALTH INSURANCE

## 2013-07-05 ENCOUNTER — Ambulatory Visit (INDEPENDENT_AMBULATORY_CARE_PROVIDER_SITE_OTHER)
Admission: RE | Admit: 2013-07-05 | Discharge: 2013-07-05 | Disposition: A | Discharge status: Home / Self Care | Payer: PRIVATE HEALTH INSURANCE | Source: Ambulatory Visit | Attending: Internal Medicine | Admitting: Internal Medicine

## 2013-07-05 ENCOUNTER — Ambulatory Visit (INDEPENDENT_AMBULATORY_CARE_PROVIDER_SITE_OTHER): Payer: PRIVATE HEALTH INSURANCE | Admitting: Internal Medicine

## 2013-07-05 VITALS — BP 128/86 | HR 109 | Temp 99.5°F | Resp 16 | Ht 64.0 in | Wt 322.8 lb

## 2013-07-05 DIAGNOSIS — J4530 Mild persistent asthma, uncomplicated: Secondary | ICD-10-CM

## 2013-07-05 DIAGNOSIS — J189 Pneumonia, unspecified organism: Secondary | ICD-10-CM

## 2013-07-05 DIAGNOSIS — IMO0001 Reserved for inherently not codable concepts without codable children: Secondary | ICD-10-CM

## 2013-07-05 DIAGNOSIS — R059 Cough, unspecified: Secondary | ICD-10-CM

## 2013-07-05 DIAGNOSIS — J45909 Unspecified asthma, uncomplicated: Secondary | ICD-10-CM

## 2013-07-05 DIAGNOSIS — E1165 Type 2 diabetes mellitus with hyperglycemia: Principal | ICD-10-CM

## 2013-07-05 DIAGNOSIS — R05 Cough: Secondary | ICD-10-CM

## 2013-07-05 DIAGNOSIS — J45901 Unspecified asthma with (acute) exacerbation: Secondary | ICD-10-CM

## 2013-07-05 LAB — BASIC METABOLIC PANEL
BUN: 10 mg/dL (ref 6–23)
CO2: 28 mEq/L (ref 19–32)
Calcium: 8.6 mg/dL (ref 8.4–10.5)
Chloride: 101 mEq/L (ref 96–112)
Creatinine, Ser: 0.7 mg/dL (ref 0.4–1.2)
GFR: 85.44 mL/min (ref >60.00)
Glucose, Bld: 116 mg/dL — ABNORMAL HIGH (ref 70–99)
Potassium: 4 mEq/L (ref 3.5–5.1)
Sodium: 137 mEq/L (ref 135–145)

## 2013-07-05 LAB — HEMOGLOBIN A1C: HEMOGLOBIN A1C: 7 % — AB (ref 4.6–6.5)

## 2013-07-05 MED ORDER — HYDROCODONE-HOMATROPINE 5-1.5 MG/5ML PO SYRP: 5.0000 mL | ORAL_SOLUTION | Freq: Three times a day (TID) | ORAL | Status: DC | PRN

## 2013-07-05 MED ORDER — AZITHROMYCIN 500 MG PO TABS: 500.0000 mg | ORAL_TABLET | Freq: Every day | ORAL | Status: DC

## 2013-07-05 MED ORDER — FLUTICASONE-SALMETEROL 250-50 MCG/DOSE IN AEPB: 1.0000 | INHALATION_SPRAY | Freq: Two times a day (BID) | RESPIRATORY_TRACT | Status: DC

## 2013-07-05 NOTE — Progress Notes (Signed)
Pre visit review using our clinic review tool, if applicable. No additional management support is needed unless otherwise documented below in the visit note. 

## 2013-07-05 NOTE — Patient Instructions (Signed)

## 2013-07-05 NOTE — Progress Notes (Signed)
Subjective:    Patient ID: Sandra Wood, female    DOB: 1956-06-28, 57 y.o.   MRN: [id number]  Cough This is a new problem. The current episode started in the past 7 days. The problem has been gradually worsening. The problem occurs every few hours. The cough is productive of purulent sputum. Associated symptoms include chills, a fever, a sore throat, shortness of breath and wheezing. Pertinent negatives include no chest pain, ear congestion, ear pain, headaches, heartburn, hemoptysis, myalgias, nasal congestion, postnasal drip, rash, rhinorrhea, sweats or weight loss. The symptoms are aggravated by cold air. She has tried OTC cough suppressant for the symptoms. The treatment provided mild relief. Her past medical history is significant for asthma and pneumonia. There is no history of bronchiectasis, bronchitis, COPD, emphysema or environmental allergies.      Review of Systems  Constitutional: Positive for fever and chills. Negative for weight loss, diaphoresis, activity change, appetite change, fatigue and unexpected weight change.  HENT: Positive for sore throat. Negative for ear pain, postnasal drip, rhinorrhea, sinus pressure, tinnitus, trouble swallowing and voice change.   Eyes: Negative.   Respiratory: Positive for cough, shortness of breath and wheezing. Negative for hemoptysis, choking, chest tightness and stridor.   Cardiovascular: Negative.  Negative for chest pain, palpitations and leg swelling.  Gastrointestinal: Negative.  Negative for heartburn, nausea, vomiting, abdominal pain, diarrhea, constipation and blood in stool.  Endocrine: Negative.   Genitourinary: Negative.   Musculoskeletal: Negative.  Negative for arthralgias, joint swelling, myalgias, neck pain and neck stiffness.  Skin: Negative.  Negative for rash.  Allergic/Immunologic: Negative.  Negative for environmental allergies.  Neurological: Negative.  Negative for dizziness, light-headedness and headaches.    Hematological: Negative.  Negative for adenopathy. Does not bruise/bleed easily.  Psychiatric/Behavioral: Negative.        Objective:   Physical Exam  Vitals reviewed. Constitutional: She is oriented to person, place, and time. She appears well-developed and well-nourished. No distress.  HENT:  Head: Normocephalic and atraumatic.  Mouth/Throat: Oropharynx is clear and moist. No oropharyngeal exudate.  Eyes: Conjunctivae are normal. Right eye exhibits no discharge. Left eye exhibits no discharge. No scleral icterus.  Neck: Normal range of motion. Neck supple. No JVD present. No tracheal deviation present. No thyromegaly present.  Cardiovascular: Normal rate, regular rhythm, normal heart sounds and intact distal pulses.  Exam reveals no gallop and no friction rub.   No murmur heard. Pulmonary/Chest: Effort normal and breath sounds normal. No stridor. No respiratory distress. She has no wheezes. She has no rales. She exhibits no tenderness.  Abdominal: Soft. Bowel sounds are normal. She exhibits no distension and no mass. There is no tenderness. There is no rebound and no guarding.  Musculoskeletal: Normal range of motion. She exhibits no edema and no tenderness.  Lymphadenopathy:    She has no cervical adenopathy.  Neurological: She is oriented to person, place, and time.  Skin: Skin is warm and dry. No rash noted. She is not diaphoretic. No erythema. No pallor.    Lab Results  Component Value Date   WBC 10.5 03/31/2012   HGB 14.2 03/31/2012   HCT 43.7 03/31/2012   PLT 379.0 03/31/2012   GLUCOSE 107* 03/31/2012   CHOL 204* 03/31/2012   TRIG 117.0 03/31/2012   HDL 42.60 03/31/2012   LDLDIRECT 137.2 03/31/2012   ALT 19 03/31/2012   AST 16 03/31/2012   NA 139 03/31/2012   K 4.0 03/31/2012   CL 106 03/31/2012   CREATININE 0.7 03/31/2012  BUN 12 03/31/2012   CO2 25 03/31/2012   TSH 2.77 03/31/2012   INR 1.1 RATIO 07/09/2006   HGBA1C 6.6* 03/31/2012        Assessment & Plan:

## 2013-07-06 ENCOUNTER — Telehealth: Payer: Self-pay | Admitting: *Deleted

## 2013-07-06 ENCOUNTER — Telehealth: Payer: Self-pay

## 2013-07-06 DIAGNOSIS — J4530 Mild persistent asthma, uncomplicated: Secondary | ICD-10-CM

## 2013-07-06 DIAGNOSIS — R062 Wheezing: Secondary | ICD-10-CM

## 2013-07-06 MED ORDER — ALBUTEROL SULFATE HFA 108 (90 BASE) MCG/ACT IN AERS: 2.0000 | INHALATION_SPRAY | RESPIRATORY_TRACT | Status: DC | PRN

## 2013-07-06 NOTE — Telephone Encounter (Signed)
Called pt to advise of xray results. While speaking with patient, she stated that Rx for adderall was discussed at office visit and would like to know if she can pick up a script. Thanks

## 2013-07-06 NOTE — Telephone Encounter (Signed)
Patient called and stated she was requesting an inhaler of albuterol, not adderrall.  She again stated the inhaler was discussed and offered in her last ov.   Pt Callback - [phone]  Thanks!

## 2013-07-06 NOTE — Telephone Encounter (Signed)
Patient has called again requesting her adderall.  Please advise.    CB# [phone]

## 2013-07-06 NOTE — Telephone Encounter (Signed)
lmovm for pt with MD advisment.

## 2013-07-06 NOTE — Addendum Note (Signed)
Addended by: Janith Lima on: 07/06/2013 03:07 PM   Modules accepted: Orders

## 2013-07-06 NOTE — Telephone Encounter (Signed)
done

## 2013-07-06 NOTE — Telephone Encounter (Signed)
This was not discussed, she will have to see psych about this

## 2013-07-07 NOTE — Assessment & Plan Note (Signed)
I will treat the infection with Zpak and will control the cough with hycodan 

## 2013-07-07 NOTE — Assessment & Plan Note (Signed)
Cont advair and albuterol

## 2013-07-07 NOTE — Assessment & Plan Note (Signed)
CXR today is neg for PNA

## 2013-07-07 NOTE — Assessment & Plan Note (Signed)
Her A1C shows good control of the blood sugar

## 2013-07-10 ENCOUNTER — Telehealth: Payer: Self-pay | Admitting: *Deleted

## 2013-07-10 DIAGNOSIS — J189 Pneumonia, unspecified organism: Secondary | ICD-10-CM

## 2013-07-10 MED ORDER — AZITHROMYCIN 500 MG PO TABS
500.0000 mg | ORAL_TABLET | Freq: Every day | ORAL | Status: DC
Start: 2013-07-10 — End: 2013-07-11

## 2013-07-10 NOTE — Telephone Encounter (Signed)
Pt called back in again this morning ([id number]) stating she also needed another z-pack, she completed the previous script, she is not improving.  Please advise.  Do u wish to order addition Rx or have her come in for f/u?    CB# [phone]

## 2013-07-10 NOTE — Telephone Encounter (Signed)
Try another zpak

## 2013-07-11 ENCOUNTER — Other Ambulatory Visit: Payer: Self-pay | Admitting: *Deleted

## 2013-07-11 DIAGNOSIS — J189 Pneumonia, unspecified organism: Secondary | ICD-10-CM

## 2013-07-11 MED ORDER — AZITHROMYCIN 500 MG PO TABS: 500.0000 mg | ORAL_TABLET | Freq: Every day | ORAL | Status: DC

## 2013-07-11 NOTE — Telephone Encounter (Signed)
Notified patient another z-pak had been sent to her pharmacy.  No answer, LM with info.

## 2013-12-26 ENCOUNTER — Telehealth: Payer: Self-pay | Admitting: *Deleted

## 2013-12-26 DIAGNOSIS — E1165 Type 2 diabetes mellitus with hyperglycemia: Principal | ICD-10-CM

## 2013-12-26 DIAGNOSIS — IMO0001 Reserved for inherently not codable concepts without codable children: Secondary | ICD-10-CM

## 2013-12-26 NOTE — Telephone Encounter (Signed)
Patient will come in for a lipid panel. FYI.

## 2014-06-26 ENCOUNTER — Telehealth: Payer: Self-pay

## 2015-05-21 ENCOUNTER — Ambulatory Visit: Payer: PRIVATE HEALTH INSURANCE | Admitting: Family

## 2015-06-25 NOTE — Telephone Encounter (Signed)
error 

## 2016-06-09 ENCOUNTER — Ambulatory Visit (INDEPENDENT_AMBULATORY_CARE_PROVIDER_SITE_OTHER): Payer: No Typology Code available for payment source | Admitting: Internal Medicine

## 2016-06-09 ENCOUNTER — Ambulatory Visit (INDEPENDENT_AMBULATORY_CARE_PROVIDER_SITE_OTHER)
Admission: RE | Admit: 2016-06-09 | Discharge: 2016-06-09 | Disposition: A | Discharge status: Home / Self Care | Payer: No Typology Code available for payment source | Source: Ambulatory Visit | Attending: Internal Medicine | Admitting: Internal Medicine

## 2016-06-09 ENCOUNTER — Encounter: Payer: Self-pay | Admitting: Internal Medicine

## 2016-06-09 VITALS — BP 140/80 | HR 98 | Temp 98.4°F | Resp 20 | Ht 64.0 in | Wt 336.0 lb

## 2016-06-09 DIAGNOSIS — J4531 Mild persistent asthma with (acute) exacerbation: Secondary | ICD-10-CM | POA: Diagnosis not present

## 2016-06-09 DIAGNOSIS — R059 Cough, unspecified: Secondary | ICD-10-CM

## 2016-06-09 DIAGNOSIS — R05 Cough: Secondary | ICD-10-CM | POA: Diagnosis not present

## 2016-06-09 DIAGNOSIS — J988 Other specified respiratory disorders: Secondary | ICD-10-CM | POA: Diagnosis not present

## 2016-06-09 DIAGNOSIS — R87610 Atypical squamous cells of undetermined significance on cytologic smear of cervix (ASC-US): Secondary | ICD-10-CM

## 2016-06-09 MED ORDER — ALBUTEROL SULFATE (2.5 MG/3ML) 0.083% IN NEBU
2.5000 mg | INHALATION_SOLUTION | Freq: Once | RESPIRATORY_TRACT | Status: AC
  Administered 2016-06-09: 2.5 mg via RESPIRATORY_TRACT

## 2016-06-09 MED ORDER — MOMETASONE FURO-FORMOTEROL FUM 200-5 MCG/ACT IN AERO: 2.0000 | INHALATION_SPRAY | Freq: Two times a day (BID) | RESPIRATORY_TRACT | 11 refills | Status: DC

## 2016-06-09 MED ORDER — CEFDINIR 300 MG PO CAPS: 300.0000 mg | ORAL_CAPSULE | Freq: Two times a day (BID) | ORAL | 1 refills | Status: AC

## 2016-06-09 MED ORDER — SERTRALINE HCL 50 MG PO TABS: 50.0000 mg | ORAL_TABLET | Freq: Every day | ORAL | 11 refills | Status: DC

## 2016-06-09 MED ORDER — IPRATROPIUM-ALBUTEROL 0.5-2.5 (3) MG/3ML IN SOLN: 3.0000 mL | Freq: Four times a day (QID) | RESPIRATORY_TRACT | 1 refills | Status: DC | PRN

## 2016-06-09 MED ORDER — METHYLPREDNISOLONE ACETATE 80 MG/ML IJ SUSP
120.0000 mg | Freq: Once | INTRAMUSCULAR | Status: AC
  Administered 2016-06-09: 120 mg via INTRAMUSCULAR

## 2016-06-09 MED ORDER — HYDROCODONE-HOMATROPINE 5-1.5 MG/5ML PO SYRP
5.0000 mL | ORAL_SOLUTION | Freq: Three times a day (TID) | ORAL | 0 refills | Status: DC | PRN
Start: 2016-06-09 — End: 2016-07-27

## 2016-06-09 NOTE — Progress Notes (Signed)
Pre visit review using our clinic review tool, if applicable. No additional management support is needed unless otherwise documented below in the visit note. 

## 2016-06-09 NOTE — Progress Notes (Signed)
Subjective:  Patient ID: Sandra Wood, female    DOB: 26-Jul-1955  Age: 60 y.o. MRN: [id number]  CC: Cough and Asthma   HPI Sandra Wood presents for Intermittent episodes of cough and wheezing for about 2 months. She has had several acute illnesses. The first was in October. The most recent one was about a week ago when she was seen at an urgent care center. She has a cough that's productive of thick yellow phlegm. She was prescribed doxycycline but says she has not improved much. She was given Ladona Ridgel for the cough as well and says that has not helped much. She is using albuterol inhaler couple times a day. She has not recently been using her Advair Diskus. She denies chest pain, hemoptysis, fever, chills, or night sweats.  Outpatient Medications Prior to Visit  Medication Sig Dispense Refill  . Fluticasone-Salmeterol (ADVAIR DISKUS) 250-50 MCG/DOSE AEPB Inhale 1 puff into the lungs 2 (two) times daily. 60 each 3  . albuterol (PROVENTIL HFA;VENTOLIN HFA) 108 (90 BASE) MCG/ACT inhaler Inhale 2 puffs into the lungs every 4 (four) hours as needed for wheezing (cough, shortness of breath or wheezing.). 1 Inhaler 11  . ipratropium (ATROVENT) 0.03 % nasal spray Place 2 sprays into the nose 2 (two) times daily. 30 mL 0  . omeprazole (PRILOSEC) 20 MG capsule Take 1 capsule (20 mg total) by mouth daily. 30 capsule 3  . azithromycin (ZITHROMAX) 500 MG tablet Take 1 tablet (500 mg total) by mouth daily. 3 tablet 0  . fluticasone (FLONASE) 50 MCG/ACT nasal spray Place 2 sprays into the nose daily. 16 g 2  . Guaifenesin (MUCINEX MAXIMUM STRENGTH) 1200 MG TB12 Take 1 tablet (1,200 mg total) by mouth every 12 (twelve) hours as needed. 14 tablet 1  . HYDROcodone-homatropine (HYCODAN) 5-1.5 MG/5ML syrup Take 5 mLs by mouth every 8 (eight) hours as needed for cough. 120 mL 0  . meloxicam (MOBIC) 7.5 MG tablet Take 7.5 mg by mouth as needed.     No facility-administered medications prior to  visit.     ROS Review of Systems  Constitutional: Negative for activity change, appetite change, chills, fatigue, fever and unexpected weight change.  HENT: Negative.  Negative for congestion, sinus pressure, sore throat and trouble swallowing.   Eyes: Negative.   Respiratory: Positive for cough and wheezing. Negative for choking, chest tightness, shortness of breath and stridor.   Cardiovascular: Negative for chest pain, palpitations and leg swelling.  Gastrointestinal: Negative for abdominal pain, diarrhea, nausea and vomiting.  Endocrine: Negative.   Genitourinary: Negative.  Negative for difficulty urinating.  Musculoskeletal: Negative for back pain and myalgias.  Skin: Negative.   Allergic/Immunologic: Negative.   Neurological: Negative.   Hematological: Negative.  Negative for adenopathy. Does not bruise/bleed easily.  Psychiatric/Behavioral: Negative.     Objective:  BP 140/80 (BP Location: Left Wrist, Patient Position: Sitting, Cuff Size: Large)   Pulse 98   Temp 98.4 F (36.9 C) (Oral)   Resp 20   Ht 5\' 4"  (1.626 m)   Wt (!) 336 lb (152.4 kg)   SpO2 93%   BMI 57.67 kg/m   BP Readings from Last 3 Encounters:  06/09/16 140/80  07/05/13 128/86  06/02/12 124/70    Wt Readings from Last 3 Encounters:  06/09/16 (!) 336 lb (152.4 kg)  07/05/13 (!) 322 lb 12 oz (146.4 kg)  06/02/12 (!) 324 lb (147 kg)    Physical Exam  Constitutional: She is oriented to person,  place, and time.  Non-toxic appearance. She does not have a sickly appearance. She appears ill. No distress.  HENT:  Mouth/Throat: Oropharynx is clear and moist. No oropharyngeal exudate.  Eyes: Conjunctivae are normal. Right eye exhibits no discharge. Left eye exhibits no discharge. No scleral icterus.  Neck: Normal range of motion. Neck supple. No JVD present. No tracheal deviation present. No thyromegaly present.  Cardiovascular: Normal rate, regular rhythm, normal heart sounds and intact distal pulses.   Exam reveals no gallop and no friction rub.   No murmur heard. Pulmonary/Chest: Effort normal. No accessory muscle usage or stridor. No tachypnea. No respiratory distress. She has no decreased breath sounds. She has wheezes in the right lower field and the left lower field. She has rhonchi in the right lower field and the left lower field. She has no rales. She exhibits no tenderness.  She received a jet nab treatment of albuterol. After that her lungs are clear to auscultation bilaterally anteriorly and posteriorly. She was given a nebulizer today.  Abdominal: Soft. Bowel sounds are normal. She exhibits no distension and no mass. There is no tenderness. There is no rebound and no guarding.  Musculoskeletal: Normal range of motion. She exhibits no edema, tenderness or deformity.  Lymphadenopathy:    She has no cervical adenopathy.  Neurological: She is oriented to person, place, and time.  Skin: Skin is warm and dry. No rash noted. She is not diaphoretic. No erythema. No pallor.  Vitals reviewed.   Lab Results  Component Value Date   WBC 10.5 03/31/2012   HGB 14.2 03/31/2012   HCT 43.7 03/31/2012   PLT 379.0 03/31/2012   GLUCOSE 116 (H) 07/05/2013   CHOL 204 (H) 03/31/2012   TRIG 117.0 03/31/2012   HDL 42.60 03/31/2012   LDLDIRECT 137.2 03/31/2012   ALT 19 03/31/2012   AST 16 03/31/2012   NA 137 07/05/2013   K 4.0 07/05/2013   CL 101 07/05/2013   CREATININE 0.7 07/05/2013   BUN 10 07/05/2013   CO2 28 07/05/2013   TSH 2.77 03/31/2012   INR 1.1 RATIO 07/09/2006   HGBA1C 7.0 (H) 07/05/2013    Dg Chest 2 View  Result Date: 07/05/2013 CLINICAL DATA:  Cough and chest pain with dyspnea and fever for 3 days, history of asthma EXAM: CHEST  2 VIEW COMPARISON:  PA and lateral chest x-ray of December 16, 2011 FINDINGS: The lungs are well-expanded. The interstitial markings are mildly increased though stable. There is no alveolar pneumonia. The cardiac silhouette is normal in size. There is  tortuosity of the descending thoracic aorta. There is no pleural effusion. The observed portions of the bony thorax appear normal. IMPRESSION: There is no evidence of pneumonia nor CHF. I cannot exclude acute bronchitis superimposed upon reactive airway disease in the appropriate clinical setting. Electronically Signed   By: David  Martinique   On: 07/05/2013 15:00    Assessment & Plan:   Annanicole was seen today for cough and asthma.  Diagnoses and all orders for this visit:  Atypical squamous cells of undetermined significance on cytologic smear of cervix (ASC-US) -     Ambulatory referral to Gynecology  Mild persistent asthma with acute exacerbation- will start jet nebs with ipratropium and albuterol. I've asked her to restart a LABA/ICS combination. She is having an acute flare so I gave her an injection of Depo-Medrol as well. She was given a sample of Dulera and she was shown how to use it. She demonstrated proficiency with its  use. -     ipratropium-albuterol (DUONEB) 0.5-2.5 (3) MG/3ML SOLN; Take 3 mLs by nebulization every 6 (six) hours as needed. -     mometasone-formoterol (DULERA) 200-5 MCG/ACT AERO; Inhale 2 puffs into the lungs 2 (two) times daily. -     albuterol (PROVENTIL) (2.5 MG/3ML) 0.083% nebulizer solution 2.5 mg; Take 3 mLs (2.5 mg total) by nebulization once. -     methylPREDNISolone acetate (DEPO-MEDROL) injection 120 mg; Inject 1.5 mLs (120 mg total) into the muscle once.  Cough- her chest x-ray is normal -     DG Chest 2 View; Future -     HYDROcodone-homatropine (HYCODAN) 5-1.5 MG/5ML syrup; Take 5 mLs by mouth every 8 (eight) hours as needed for cough.  RTI (respiratory tract infection)- she appears to have a lower respiratory infection that is resistant to doxycycline. Will treat with a cephalosporin of offered her Hycodan for sx relief.  -     cefdinir (OMNICEF) 300 MG capsule; Take 1 capsule (300 mg total) by mouth 2 (two) times daily. -     HYDROcodone-homatropine  (HYCODAN) 5-1.5 MG/5ML syrup; Take 5 mLs by mouth every 8 (eight) hours as needed for cough.  Other orders -     sertraline (ZOLOFT) 50 MG tablet; Take 1 tablet (50 mg total) by mouth daily.   I have discontinued Ms. Zehner's meloxicam, Guaifenesin, HYDROcodone-homatropine, Fluticasone-Salmeterol, azithromycin, benzonatate, and doxycycline. I am also having her start on ipratropium-albuterol, mometasone-formoterol, cefdinir, sertraline, and HYDROcodone-homatropine. Additionally, I am having her maintain her ipratropium, omeprazole, albuterol, albuterol, and fluticasone. We administered albuterol and methylPREDNISolone acetate.  Meds ordered this encounter  Medications  . DISCONTD: benzonatate (TESSALON) 200 MG capsule    Sig: Take by mouth.  . DISCONTD: doxycycline (VIBRAMYCIN) 100 MG capsule    Sig: Take by mouth.  Marland Kitchen albuterol (PROVENTIL HFA;VENTOLIN HFA) 108 (90 Base) MCG/ACT inhaler    Sig: Inhale into the lungs.  . fluticasone (FLONASE) 50 MCG/ACT nasal spray    Sig: Place into the nose.  Marland Kitchen ipratropium-albuterol (DUONEB) 0.5-2.5 (3) MG/3ML SOLN    Sig: Take 3 mLs by nebulization every 6 (six) hours as needed.    Dispense:  360 mL    Refill:  1  . mometasone-formoterol (DULERA) 200-5 MCG/ACT AERO    Sig: Inhale 2 puffs into the lungs 2 (two) times daily.    Dispense:  13 g    Refill:  11  . cefdinir (OMNICEF) 300 MG capsule    Sig: Take 1 capsule (300 mg total) by mouth 2 (two) times daily.    Dispense:  20 capsule    Refill:  1  . sertraline (ZOLOFT) 50 MG tablet    Sig: Take 1 tablet (50 mg total) by mouth daily.    Dispense:  30 tablet    Refill:  11  . HYDROcodone-homatropine (HYCODAN) 5-1.5 MG/5ML syrup    Sig: Take 5 mLs by mouth every 8 (eight) hours as needed for cough.    Dispense:  120 mL    Refill:  0  . albuterol (PROVENTIL) (2.5 MG/3ML) 0.083% nebulizer solution 2.5 mg  . methylPREDNISolone acetate (DEPO-MEDROL) injection 120 mg     Follow-up: Return in  about 2 weeks (around 06/23/2016).  Scarlette Calico, MD

## 2016-06-09 NOTE — Patient Instructions (Signed)
Cough, Adult Coughing is a reflex that clears your throat and your airways. Coughing helps to heal and protect your lungs. It is normal to cough occasionally, but a cough that happens with other symptoms or lasts a long time may be a sign of a condition that needs treatment. A cough may last only 2-3 weeks (acute), or it may last longer than 8 weeks (chronic). What are the causes? Coughing is commonly caused by:  Breathing in substances that irritate your lungs.  A viral or bacterial respiratory infection.  Allergies.  Asthma.  Postnasal drip.  Smoking.  Acid backing up from the stomach into the esophagus (gastroesophageal reflux).  Certain medicines.  Chronic lung problems, including COPD (or rarely, lung cancer).  Other medical conditions such as heart failure.  Follow these instructions at home: Pay attention to any changes in your symptoms. Take these actions to help with your discomfort:  Take medicines only as told by your health care provider. ? If you were prescribed an antibiotic medicine, take it as told by your health care provider. Do not stop taking the antibiotic even if you start to feel better. ? Talk with your health care provider before you take a cough suppressant medicine.  Drink enough fluid to keep your urine clear or pale yellow.  If the air is dry, use a cold steam vaporizer or humidifier in your bedroom or your home to help loosen secretions.  Avoid anything that causes you to cough at work or at home.  If your cough is worse at night, try sleeping in a semi-upright position.  Avoid cigarette smoke. If you smoke, quit smoking. If you need help quitting, ask your health care provider.  Avoid caffeine.  Avoid alcohol.  Rest as needed.  Contact a health care provider if:  You have new symptoms.  You cough up pus.  Your cough does not get better after 2-3 weeks, or your cough gets worse.  You cannot control your cough with suppressant  medicines and you are losing sleep.  You develop pain that is getting worse or pain that is not controlled with pain medicines.  You have a fever.  You have unexplained weight loss.  You have night sweats. Get help right away if:  You cough up blood.  You have difficulty breathing.  Your heartbeat is very fast. This information is not intended to replace advice given to you by your health care provider. Make sure you discuss any questions you have with your health care provider. Document Released: 12/12/2010 Document Revised: 11/21/2015 Document Reviewed: 08/22/2014 Elsevier Interactive Patient Education  2017 Elsevier Inc.  

## 2016-07-27 ENCOUNTER — Ambulatory Visit (INDEPENDENT_AMBULATORY_CARE_PROVIDER_SITE_OTHER): Payer: No Typology Code available for payment source | Admitting: Obstetrics and Gynecology

## 2016-07-27 ENCOUNTER — Encounter: Payer: Self-pay | Admitting: Obstetrics and Gynecology

## 2016-07-27 VITALS — BP 132/68 | HR 84 | Resp 16 | Ht 61.75 in | Wt 333.0 lb

## 2016-07-27 DIAGNOSIS — Z1231 Encounter for screening mammogram for malignant neoplasm of breast: Secondary | ICD-10-CM

## 2016-07-27 DIAGNOSIS — Z1239 Encounter for other screening for malignant neoplasm of breast: Secondary | ICD-10-CM

## 2016-07-27 DIAGNOSIS — L989 Disorder of the skin and subcutaneous tissue, unspecified: Secondary | ICD-10-CM | POA: Diagnosis not present

## 2016-07-27 DIAGNOSIS — Z01419 Encounter for gynecological examination (general) (routine) without abnormal findings: Secondary | ICD-10-CM

## 2016-07-27 LAB — HM PAP SMEAR

## 2016-07-27 MED ORDER — NYSTATIN 100000 UNIT/GM EX POWD: Freq: Three times a day (TID) | CUTANEOUS | 2 refills | Status: DC

## 2016-07-27 NOTE — Progress Notes (Signed)
61 y.o. G72P2002 Divorced Caucasian female here for annual exam.    No regular gynecology care for many years.  Former patient of [facility]. States pap in 2009 was ASCUS.  States she went to [facility] and had further evaluation. She is unclear about this care and what was done.  On chart review, I see surgical pathology on 08/16/06  including Endometrial curettings which showed a benign endometrial polyp and benign ectocervical mucosa.  States she had postmenopausal bleeding about 5 years after she started to go into menopause.  Was seen at [facility] and was treated with medication.   No bleeding or spotting now.  Not on HRT now or ever.   Not sexually active.  Declines STD testing.  Hx HSV II.  No regular outbreaks.  Used antiviral medication when she was first diagnosed.  Works at [employer].  PCP:   Scarlette Calico   Patient's last menstrual period was 06/30/2003.           Sexually active: No.  The current method of family planning is tubal ligation, abstinence and post menopausal status.    Exercising: No.  The patient does not participate in regular exercise at present. Smoker:  Former   Health Maintenance: Pap:  01/2008 Abnormal.  ASCUS per patient.  History of abnormal Pap:  yes MMG:  04/16/11 Normal  Colonoscopy:  Never.  Will schedule through Dr. Ronnald Ramp, her PCP.  BMD:   Never TDaP:  03/2012 HIV: no Hep C: no Screening Labs:  Hb today: PCP, Urine today: PCP   reports that she quit smoking about 29 years ago. Her smoking use included Cigarettes. She quit after 5.00 years of use. She has never used smokeless tobacco. She reports that she drinks alcohol. She reports that she does not use drugs.  Past Medical History:  Diagnosis Date  . Abnormal Pap smear of cervix 2009  . Allergic rhinitis, cause unspecified   . Arthritis   . Arthritis of ankle BILATERAL / HX FX'S  . Asthma, mild persistent   . GERD (gastroesophageal reflux disease)   . HSV-2 (herpes  simplex virus 2) infection    at age 55  . Obesity, morbid ([place])   . Tear of medial meniscus of knee LEFT  . Type II or unspecified type diabetes mellitus without mention of complication, uncontrolled     Past Surgical History:  Procedure Laterality Date  . CHONDROPLASTY  08/19/2011   Procedure: CHONDROPLASTY;  Surgeon: Magnus Sinning, MD;  Location: [facility];  Service: Orthopedics;;  shaving of medial femeral chondral  . HYSTEROSCOPY W/D&C  2008  . KNEE ARTHROSCOPY W/ MENISCECTOMY  07-07-2010  . MENISECTOMY  08/19/2011   Procedure: MENISECTOMY;  Surgeon: Magnus Sinning, MD;  Location: [facility];  Service: Orthopedics;;  partial lateral menisectomy  . TUBAL LIGATION  AGE 46    Current Outpatient Prescriptions  Medication Sig Dispense Refill  . albuterol (PROVENTIL HFA;VENTOLIN HFA) 108 (90 Base) MCG/ACT inhaler Inhale into the lungs.    Marland Kitchen ipratropium-albuterol (DUONEB) 0.5-2.5 (3) MG/3ML SOLN Take 3 mLs by nebulization every 6 (six) hours as needed. 360 mL 1  . sertraline (ZOLOFT) 50 MG tablet Take 1 tablet (50 mg total) by mouth daily. 30 tablet 11  . ipratropium (ATROVENT) 0.03 % nasal spray Place 2 sprays into the nose 2 (two) times daily. 30 mL 0   No current facility-administered medications for this visit.     Family History  Problem Relation Age  of Onset  . Diabetes Mother   . Heart disease Father   . Cancer Brother 45    prostate    ROS:  Pertinent items are noted in HPI.  Otherwise, a comprehensive ROS was negative.  Exam:   BP 132/68 (BP Location: Right Arm, Patient Position: Sitting, Cuff Size: Normal)   Pulse 84   Resp 16   Ht 5' 1.75" (1.568 m)   Wt (!) 333 lb (151 kg)   LMP 06/30/2003   BMI 61.40 kg/m     General appearance: alert, cooperative and appears stated age Head: Normocephalic, without obvious abnormality, atraumatic Neck: no adenopathy, supple, symmetrical, trachea midline and thyroid normal to inspection  and palpation Lungs: clear to auscultation bilaterally Cor:  S1S2 RRR.  No murmur. Abdomen:  Obese, soft, nontender.  No masses.  Erythema of intertriginous areas. Breasts: normal appearance, no masses or tenderness, No nipple retraction or dimpling, No nipple discharge obese, soft, non-tender; no masses, no organomegaly Extremities: extremities normal, atraumatic, no cyanosis or edema Skin: dry flaking areas of skin throughout.  Multiple freckles. Lymph nodes: Cervical, supraclavicular, and axillary nodes normal. No abnormal inguinal nodes palpated Neurologic: Grossly normal  Pelvic: External genitalia:  no lesions              Urethra:  normal appearing urethra with no masses, tenderness or lesions              Bartholins and Skenes: normal                 Vagina: normal appearing vagina with normal color and discharge, no lesions              Cervix: no lesions              Pap taken: Yes.   Bimanual Exam:  Uterus:  normal size, contour, position, consistency, mobility, non-tender.  Exam limited due to [facility].              Adnexa: no mass, fullness, tenderness              Rectal exam: Yes.  .  Confirms.              Anus:  normal sphincter tone, no lesions  Chaperone was present for exam.  Assessment:   Well woman visit with normal exam. Hx ASCUS pap? Hx HSV. Skin lesions.  Yeast around pannus.  Plan: Mammogram screening discussed.  We will assist in scheduling.  Recommended self breast awareness. Pap and HR HPV as above. Guidelines for Calcium, Vitamin D, regular exercise program including cardiovascular and weight bearing exercise. Declines antiviral medication.  Nystatin powder. Referral to dermatology.  Will get records from [place]. Follow up annually and prn.      After visit summary provided.

## 2016-07-27 NOTE — Progress Notes (Signed)
Spoke with Cherish at [facility]. Patient scheduled for screening MMG while in office. Patient scheduled for 08/03/16 arriving at 1:20pm for 1:40pm appointment. Patient is agreeable to date and time.

## 2016-07-27 NOTE — Patient Instructions (Signed)

## 2016-07-28 ENCOUNTER — Other Ambulatory Visit: Payer: Self-pay | Admitting: Internal Medicine

## 2016-07-28 DIAGNOSIS — J4530 Mild persistent asthma, uncomplicated: Secondary | ICD-10-CM

## 2016-07-28 DIAGNOSIS — J4531 Mild persistent asthma with (acute) exacerbation: Secondary | ICD-10-CM

## 2016-07-28 DIAGNOSIS — F339 Major depressive disorder, recurrent, unspecified: Secondary | ICD-10-CM

## 2016-07-28 MED ORDER — IPRATROPIUM-ALBUTEROL 0.5-2.5 (3) MG/3ML IN SOLN: 3.0000 mL | Freq: Four times a day (QID) | RESPIRATORY_TRACT | 3 refills | Status: DC | PRN

## 2016-07-28 MED ORDER — SERTRALINE HCL 50 MG PO TABS: 50.0000 mg | ORAL_TABLET | Freq: Every day | ORAL | 3 refills | Status: DC

## 2016-07-30 LAB — IPS PAP TEST WITH HPV

## 2016-08-03 ENCOUNTER — Ambulatory Visit: Payer: No Typology Code available for payment source

## 2016-08-06 ENCOUNTER — Encounter: Payer: Self-pay | Admitting: Obstetrics and Gynecology

## 2016-08-10 ENCOUNTER — Ambulatory Visit
Admission: RE | Admit: 2016-08-10 | Discharge: 2016-08-10 | Disposition: A | Discharge status: Home / Self Care | Payer: No Typology Code available for payment source | Source: Ambulatory Visit | Attending: Obstetrics and Gynecology | Admitting: Obstetrics and Gynecology

## 2016-08-10 DIAGNOSIS — Z1239 Encounter for other screening for malignant neoplasm of breast: Secondary | ICD-10-CM

## 2016-08-12 ENCOUNTER — Other Ambulatory Visit: Payer: Self-pay | Admitting: Obstetrics and Gynecology

## 2016-08-12 DIAGNOSIS — R928 Other abnormal and inconclusive findings on diagnostic imaging of breast: Secondary | ICD-10-CM

## 2016-08-14 ENCOUNTER — Telehealth: Payer: Self-pay | Admitting: Obstetrics and Gynecology

## 2016-08-14 NOTE — Telephone Encounter (Signed)
Dermatology specialist is calling regarding this referral. They have not been able reach this patient or leave any messages.

## 2016-08-17 ENCOUNTER — Ambulatory Visit
Admission: RE | Admit: 2016-08-17 | Discharge: 2016-08-17 | Disposition: A | Discharge status: Home / Self Care | Payer: No Typology Code available for payment source | Source: Ambulatory Visit | Attending: Obstetrics and Gynecology | Admitting: Obstetrics and Gynecology

## 2016-08-17 ENCOUNTER — Other Ambulatory Visit: Payer: Self-pay | Admitting: Obstetrics and Gynecology

## 2016-08-17 DIAGNOSIS — N6320 Unspecified lump in the left breast, unspecified quadrant: Secondary | ICD-10-CM

## 2016-08-17 DIAGNOSIS — R928 Other abnormal and inconclusive findings on diagnostic imaging of breast: Secondary | ICD-10-CM

## 2016-08-17 NOTE — Telephone Encounter (Signed)
Spoke with patient. Provided all contact information for Dermatology Specialists for scheduling. Patient agreeable. No further questions. Ok to close.

## 2016-08-18 ENCOUNTER — Ambulatory Visit
Admission: RE | Admit: 2016-08-18 | Discharge: 2016-08-18 | Disposition: A | Discharge status: Home / Self Care | Payer: No Typology Code available for payment source | Source: Ambulatory Visit | Attending: Obstetrics and Gynecology | Admitting: Obstetrics and Gynecology

## 2016-08-18 DIAGNOSIS — N6320 Unspecified lump in the left breast, unspecified quadrant: Secondary | ICD-10-CM

## 2016-08-20 ENCOUNTER — Telehealth: Payer: Self-pay | Admitting: *Deleted

## 2016-08-20 ENCOUNTER — Encounter: Payer: Self-pay | Admitting: *Deleted

## 2016-08-20 ENCOUNTER — Telehealth: Payer: Self-pay | Admitting: Obstetrics and Gynecology

## 2016-08-20 DIAGNOSIS — C50312 Malignant neoplasm of lower-inner quadrant of left female breast: Secondary | ICD-10-CM

## 2016-08-20 DIAGNOSIS — Z170 Estrogen receptor positive status [ER+]: Principal | ICD-10-CM

## 2016-08-20 HISTORY — DX: Estrogen receptor positive status (ER+): C50.312

## 2016-08-20 NOTE — Telephone Encounter (Signed)
Phone call to patient regarding her new diagnosis of left breast cancer.   She has an appointment with multidisciplinary team on 08/26/16 at the [facility].  Support offered.

## 2016-08-20 NOTE — Telephone Encounter (Signed)
Confirmed [facility] for 08/26/16 at 0815 .  Instructions and contact information given.

## 2016-08-21 ENCOUNTER — Telehealth: Payer: Self-pay | Admitting: *Deleted

## 2016-08-21 NOTE — Telephone Encounter (Signed)
Mailed [facility] packet to pt. 

## 2016-08-26 ENCOUNTER — Other Ambulatory Visit: Payer: Self-pay | Admitting: *Deleted

## 2016-08-26 ENCOUNTER — Other Ambulatory Visit: Payer: Self-pay | Admitting: Surgery

## 2016-08-26 ENCOUNTER — Ambulatory Visit (HOSPITAL_BASED_OUTPATIENT_CLINIC_OR_DEPARTMENT_OTHER): Payer: No Typology Code available for payment source | Admitting: Hematology and Oncology

## 2016-08-26 ENCOUNTER — Ambulatory Visit: Payer: No Typology Code available for payment source | Attending: Surgery | Admitting: Physical Therapy

## 2016-08-26 ENCOUNTER — Encounter: Payer: Self-pay | Admitting: *Deleted

## 2016-08-26 ENCOUNTER — Ambulatory Visit
Admission: RE | Admit: 2016-08-26 | Discharge: 2016-08-26 | Disposition: A | Discharge status: Home / Self Care | Payer: No Typology Code available for payment source | Source: Ambulatory Visit | Attending: Radiation Oncology | Admitting: Radiation Oncology

## 2016-08-26 ENCOUNTER — Encounter: Payer: Self-pay | Admitting: Genetic Counselor

## 2016-08-26 ENCOUNTER — Encounter: Payer: Self-pay | Admitting: Physical Therapy

## 2016-08-26 ENCOUNTER — Telehealth (HOSPITAL_COMMUNITY): Payer: Self-pay | Admitting: Vascular Surgery

## 2016-08-26 ENCOUNTER — Other Ambulatory Visit (HOSPITAL_BASED_OUTPATIENT_CLINIC_OR_DEPARTMENT_OTHER): Payer: No Typology Code available for payment source

## 2016-08-26 VITALS — BP 167/63 | HR 78 | Temp 97.8°F | Resp 19 | Ht 61.75 in | Wt 336.0 lb

## 2016-08-26 DIAGNOSIS — Z170 Estrogen receptor positive status [ER+]: Principal | ICD-10-CM

## 2016-08-26 DIAGNOSIS — R293 Abnormal posture: Secondary | ICD-10-CM | POA: Diagnosis present

## 2016-08-26 DIAGNOSIS — C50312 Malignant neoplasm of lower-inner quadrant of left female breast: Secondary | ICD-10-CM

## 2016-08-26 DIAGNOSIS — R262 Difficulty in walking, not elsewhere classified: Secondary | ICD-10-CM

## 2016-08-26 DIAGNOSIS — C50912 Malignant neoplasm of unspecified site of left female breast: Secondary | ICD-10-CM

## 2016-08-26 LAB — COMPREHENSIVE METABOLIC PANEL
ALT: 12 U/L (ref 0–55)
AST: 9 U/L (ref 5–34)
Albumin: 3.1 g/dL — ABNORMAL LOW (ref 3.5–5.0)
Alkaline Phosphatase: 128 U/L (ref 40–150)
Anion Gap: 9 mEq/L (ref 3–11)
BUN: 14.7 mg/dL (ref 7.0–26.0)
CALCIUM: 9.7 mg/dL (ref 8.4–10.4)
CHLORIDE: 105 meq/L (ref 98–109)
CO2: 28 mEq/L (ref 22–29)
CREATININE: 0.8 mg/dL (ref 0.6–1.1)
EGFR: 84 mL/min/{1.73_m2} — ABNORMAL LOW (ref >90)
GLUCOSE: 128 mg/dL (ref 70–140)
Potassium: 4 mEq/L (ref 3.5–5.1)
Sodium: 142 mEq/L (ref 136–145)
Total Bilirubin: 0.64 mg/dL (ref 0.20–1.20)
Total Protein: 7.4 g/dL (ref 6.4–8.3)

## 2016-08-26 LAB — CBC WITH DIFFERENTIAL/PLATELET
BASO%: 0.4 % (ref 0.0–2.0)
Basophils Absolute: 0 10*3/uL (ref 0.0–0.1)
EOS%: 2.7 % (ref 0.0–7.0)
Eosinophils Absolute: 0.3 10*3/uL (ref 0.0–0.5)
HEMATOCRIT: 44.8 % (ref 34.8–46.6)
HGB: 13.9 g/dL (ref 11.6–15.9)
LYMPH#: 2.4 10*3/uL (ref 0.9–3.3)
LYMPH%: 22.7 % (ref 14.0–49.7)
MCH: 26.9 pg (ref 25.1–34.0)
MCHC: 31 g/dL — AB (ref 31.5–36.0)
MCV: 86.8 fL (ref 79.5–101.0)
MONO#: 0.8 10*3/uL (ref 0.1–0.9)
MONO%: 7.9 % (ref 0.0–14.0)
NEUT#: 6.9 10*3/uL — ABNORMAL HIGH (ref 1.5–6.5)
NEUT%: 66.3 % (ref 38.4–76.8)
Platelets: 303 10*3/uL (ref 145–400)
RBC: 5.16 10*6/uL (ref 3.70–5.45)
RDW: 15.5 % — AB (ref 11.2–14.5)
WBC: 10.4 10*3/uL — AB (ref 3.9–10.3)

## 2016-08-26 NOTE — Telephone Encounter (Signed)
Left pt VM to make NP appt w/ ECHO

## 2016-08-26 NOTE — Progress Notes (Signed)
Clinical Social Work [place] Psychosocial Distress Screening [place]  Patient completed distress screening protocol and scored a 3 on the Psychosocial Distress Thermometer which indicates mild distress. Clinical Social Worker met with patient and patients family in [facility] to assess for distress and other psychosocial needs. Patient stated she was feeling overwhelmed but felt "better" after meeting with the treatment team and getting more information on her treatment plan. CSW and patient discussed common feeling and emotions when being diagnosed with cancer, and the importance of support during treatment. CSW informed patient of the support team and support services at [facility]. CSW provided contact information and encouraged patient to call with any questions or concerns.   ON[facility]N DISTRESS SCREENING 08/26/2016  Screening Type Initial Screening  Distress experienced in past week (1-10) 3  Family Problem type Other (comment)  Physical Problem type Pain  Physician notified of physical symptoms Yes    Johnnye Lana, MSW, LCSW, OSW-C Clinical Social Worker [place] [phone]

## 2016-08-26 NOTE — Patient Instructions (Signed)

## 2016-08-26 NOTE — Progress Notes (Unsigned)
Nutrition Assessment  Reason for Assessment:  Pt seen in Breast Clinic  ASSESSMENT:   61 year old female with new diagnosis of left breast cancer.  Past medical history of  DM.  Patient reports normal appetite.  Medications:  reviewed  Labs: reviewed  Anthropometrics:   Height: 61.75  inches Weight: 336 pounds BMI: 27   NUTRITION DIAGNOSIS: Food and nutrition related knowledge deficit related to new diagnosis of breast cancer as evidenced by no prior need for nutrition related information.  INTERVENTION:   Discussed and provided packet of information regarding nutritional tips for breast cancer patients.  Questions answered.  Teachback method used.  Contact information provided and patient knows to contact me with questions/concerns.    MONITORING, EVALUATION, and GOAL: Pt will consume a healthy plant based diet to maintain lean body mass throughout treatment.   Carel Carrier B. Zenia Resides, [place], [place] Registered Dietitian [phone] (pager)

## 2016-08-26 NOTE — Progress Notes (Signed)
[place] NOTE  Patient Care Team: Janith Lima, MD as PCP - General (Internal Medicine) Alphonsa Overall, MD as Consulting Physician (General Surgery) Nicholas Lose, MD as Consulting Physician (Hematology and Oncology) Eppie Gibson, MD as Attending Physician (Radiation Oncology)  CHIEF COMPLAINTS/PURPOSE OF CONSULTATION:  Newly diagnosed breast cancer  HISTORY OF PRESENTING ILLNESS:  Sandra Wood 61 y.o. female is here because of recent diagnosis of left breast cancer. Patient had a screening mammogram the detected a retroareolar distortion measuring 8 mm in size with negative axilla. This was biopsied under ultrasound and it came back as grade 3 invasive ductal carcinoma that is ER/PR positive HER-2 positive with a Ki-67 20%. She was presented at multidisciplinary tumor board and she is here today to discuss the treatment plan. She is here today accompanied by her mother. Patient is obese.  I reviewed her records extensively and collaborated the history with the patient.  SUMMARY OF ONCOLOGIC HISTORY:   Malignant neoplasm of lower-inner quadrant of left breast in female, estrogen receptor positive ([place])   08/18/2016 Initial Diagnosis    Left breast biopsy 8:00 retroareolar position: IDC, high-grade with DCIS high-grade, ER 90%, PR 95%, Ki-67 20%, HER-2 positive ratio 3.4; screening detected left breast distortion LIQ 8 8:00 retroareolar 0.8 cm, axilla negative, T1b N0 stage IA clinical stage      MEDICAL HISTORY:  Past Medical History:  Diagnosis Date  . Abnormal Pap smear of cervix 2009  . Allergic rhinitis, cause unspecified   . Arthritis   . Arthritis of ankle BILATERAL / HX FX'S  . Asthma, mild persistent   . Fibroids 2008  . GERD (gastroesophageal reflux disease)   . HSV-2 (herpes simplex virus 2) infection    at age 37  . Malignant neoplasm of lower-inner quadrant of left breast in female, estrogen receptor positive ([place]) 08/20/2016  . Obesity,  morbid ([place])   . Tear of medial meniscus of knee LEFT  . Type II or unspecified type diabetes mellitus without mention of complication, uncontrolled     SURGICAL HISTORY: Past Surgical History:  Procedure Laterality Date  . CHONDROPLASTY  08/19/2011   Procedure: CHONDROPLASTY;  Surgeon: Magnus Sinning, MD;  [facility];  Service: Orthopedics;;  shaving of medial femeral chondral  . HYSTEROSCOPY W/D&C  2008   benign endometrial polyp - Dr. Maryelizabeth Rowan  . KNEE ARTHROSCOPY W/ MENISCECTOMY  07-07-2010  . MENISECTOMY  08/19/2011   Procedure: MENISECTOMY;  Surgeon: Magnus Sinning, MD;  Location: [facility];  Service: Orthopedics;;  partial lateral menisectomy  . TUBAL LIGATION  AGE 33    SOCIAL HISTORY: Social History   Social History  . Marital status: Divorced    Spouse name: N/A  . Number of children: N/A  . Years of education: N/A   Occupational History  . Not on file.   Social History Main Topics  . Smoking status: Former Smoker    Years: 5.00    Types: Cigarettes    Quit date: 08/13/1986  . Smokeless tobacco: Never Used  . Alcohol use Yes     Comment: OCC.  . Drug use: No  . Sexual activity: Not Currently    Birth control/ protection: Post-menopausal, Abstinence   Other Topics Concern  . Not on file   Social History Narrative  . No narrative on file    FAMILY HISTORY: Family History  Problem Relation Age of Onset  . Diabetes Mother   . Heart  disease Father   . Cancer Brother 60    prostate  . Breast cancer Maternal Grandmother 88    ALLERGIES:  has No Known Allergies.  MEDICATIONS:  Current Outpatient Prescriptions  Medication Sig Dispense Refill  . sertraline (ZOLOFT) 50 MG tablet Take 1 tablet (50 mg total) by mouth daily. 90 tablet 3   No current facility-administered medications for this visit.     REVIEW OF SYSTEMS:   Constitutional: Denies fevers, chills or abnormal night sweats Eyes: Denies  blurriness of vision, double vision or watery eyes Ears, nose, mouth, throat, and face: Denies mucositis or sore throat Respiratory: Denies cough, dyspnea or wheezes Cardiovascular: Denies palpitation, chest discomfort or lower extremity swelling Gastrointestinal:  Denies nausea, heartburn or change in bowel habits Skin: Denies abnormal skin rashes Lymphatics: Denies new lymphadenopathy or easy bruising Neurological:Denies numbness, tingling or new weaknesses, complains of arthritis. Behavioral/Psych: Mood is stable, no new changes  Breast:  Denies any palpable lumps or discharge All other systems were reviewed with the patient and are negative.  PHYSICAL EXAMINATION: ECOG PERFORMANCE STATUS: 1 - Symptomatic but completely ambulatory  Vitals:   08/26/16 0830  BP: (!) 167/63  Pulse: 78  Resp: 19  Temp: 97.8 F (36.6 C)   Filed Weights   08/26/16 0830  Weight: (!) 336 lb (152.4 kg)    GENERAL:alert, no distress and comfortable SKIN: skin color, texture, turgor are normal, no rashes or significant lesions EYES: normal, conjunctiva are pink and non-injected, sclera clear OROPHARYNX:no exudate, no erythema and lips, buccal mucosa, and tongue normal  NECK: supple, thyroid normal size, non-tender, without nodularity LYMPH:  no palpable lymphadenopathy in the cervical, axillary or inguinal LUNGS: clear to auscultation and percussion with normal breathing effort HEART: regular rate & rhythm and no murmurs and no lower extremity edema ABDOMEN:abdomen soft, non-tender and normal bowel sounds Musculoskeletal:no cyanosis of digits and no clubbing  PSYCH: alert & oriented x 3 with fluent speech NEURO: no focal motor/sensory deficits BREAST: No palpable nodules in breast. No palpable axillary or supraclavicular lymphadenopathy (exam performed in the presence of a chaperone)   LABORATORY DATA:  I have reviewed the data as listed Lab Results  Component Value Date   WBC 10.4 (H)  08/26/2016   HGB 13.9 08/26/2016   HCT 44.8 08/26/2016   MCV 86.8 08/26/2016   PLT 303 08/26/2016   Lab Results  Component Value Date   NA 142 08/26/2016   K 4.0 08/26/2016   CL 101 07/05/2013   CO2 28 08/26/2016    RADIOGRAPHIC STUDIES: I have personally reviewed the radiological reports and agreed with the findings in the report.  ASSESSMENT AND PLAN:  Malignant neoplasm of lower-inner quadrant of left breast in female, estrogen receptor positive ([place]) Left breast biopsy 08/18/2016: 8:00 retroareolar position: IDC, high-grade with DCIS high-grade, ER 90%, PR 95%, Ki-67 20%, HER-2 positive ratio 3.4; screening detected left breast distortion LIQ 8 8:00 retroareolar 0.8 cm, axilla negative, T1b N0 stage IA clinical stage  Pathology and radiology counseling: Discussed with the patient, the details of pathology including the type of breast cancer,the clinical staging, the significance of ER, PR and HER-2/neu receptors and the implications for treatment. After reviewing the pathology in detail, we proceeded to discuss the different treatment options between surgery, radiation, chemotherapy, antiestrogen therapies.  Recommendation: 1. Breast conserving surgery 2. followed by adjuvant chemotherapy and Herceptin with weekly Taxol and Herceptin 12 followed by Herceptin maintenance for 1 year 3. Followed by adjuvant radiation 4. Followed  by adjuvant antiestrogen therapy with letrozole 2.5 mg daily 5 years 5. Genetics consult will be obtained. 6. Breast MRI also be ordered.   Chemotherapy counseling: I discussed this and benefits of chemotherapy including the risks and heart as well as neutropenia and neuropathy. Patient understands his risks.  Return to clinic after surgery to discuss the final pathology report and to initiate adjuvant chemotherapy.   All questions were answered. The patient knows to call the clinic with any problems, questions or concerns.    Rulon Eisenmenger,  MD 08/26/16

## 2016-08-26 NOTE — Progress Notes (Signed)
Radiation Oncology         [phone] ________________________________  Initial Outpatient Consultation  Name: Sandra Wood MRN: [id number]  Date: 08/26/2016  DOB: July 19, 1955  CC:Sandra Calico, MD  Alphonsa Overall, MD   REFERRING PHYSICIAN: Alphonsa Overall, MD  DIAGNOSIS: Clinical stage IA (cT1bN0) high grade invasive ductal carcinoma of the left breast (triple positive)  HISTORY OF PRESENT ILLNESS::Sandra Wood is a 61 y.o. female who had a screening bilateral mammogram on 08/11/16. This showed a possible distortion in the left breast. The patient had a diagnostic mammogram of the left breast on 08/17/16. This showed a mass with an associated distortion measuring approximately 0.9 cm. Physical exam revealed an area of nodularity in the slightly inner/retroareolar left breast. Ultrasound showed an irregular mass at the 8:00 position measuring 0.8 x 0.9 x 0.8 cm corresponding to the mass seen on mammography and additional areas of fibrocystic change/clusters of cyst in the retreoareolar left breast. No lymphadenopathy was seen in the left axilla and the right breast was negative. Biopsy of the LIQ left breast 8:00 retroareolar position revealed high grade IDC and high grade DCIS (ER 90% positive, PR 95% positive, HER2 positive, Ki67 20%). The patient presents today in multidisciplinary breast clinic to discuss treatment options for the management of her disease.  Gynecologic History  Age at first menstrual period? 8  Are you still having periods? No Approximate date of last period? June 2006  If you no longer have periods: Have you used hormone replacement? No Obstetric History:  How many children have you carried to term? 2 Your age at first live birth? 7  Pregnant now or trying to get pregnant? No  Have you used birth control pills or hormone shots for contraception? Yes  If so, for how long (or approximate dates)? 10 years ago Health Maintenance:  Have you ever had a colonoscopy?  No  Have you ever had a bone density? No  Date of your last PAP smear? 07/2016 Date of your FIRST mammogram? 1970   PREVIOUS RADIATION THERAPY: No  PAST MEDICAL HISTORY:  has a past medical history of Abnormal Pap smear of cervix (2009); Allergic rhinitis, cause unspecified; Arthritis; Arthritis of ankle (BILATERAL / HX FX'S); Asthma, mild persistent; Fibroids (2008); GERD (gastroesophageal reflux disease); HSV-2 (herpes simplex virus 2) infection; Malignant neoplasm of lower-inner quadrant of left breast in female, estrogen receptor positive ([place]) (08/20/2016); Obesity, morbid ([place]); Tear of medial meniscus of knee (LEFT); and Type II or unspecified type diabetes mellitus without mention of complication, uncontrolled.    PAST SURGICAL HISTORY: Past Surgical History:  Procedure Laterality Date  . CHONDROPLASTY  08/19/2011   Procedure: CHONDROPLASTY;  Surgeon: Magnus Sinning, MD;  Location: [facility];  Service: Orthopedics;;  shaving of medial femeral chondral  . HYSTEROSCOPY W/D&C  2008   benign endometrial polyp - Dr. Maryelizabeth Rowan  . KNEE ARTHROSCOPY W/ MENISCECTOMY  07-07-2010  . MENISECTOMY  08/19/2011   Procedure: MENISECTOMY;  Surgeon: Magnus Sinning, MD;  [facility];  Service: Orthopedics;;  partial lateral menisectomy  . TUBAL LIGATION  AGE 31    FAMILY HISTORY: family history includes Breast cancer (age of onset: 20) in her maternal grandmother; Cancer (age of onset: 73) in her brother; Diabetes in her mother; Heart disease in her father.  SOCIAL HISTORY:  reports that she quit smoking about 30 years ago. Her smoking use included Cigarettes. She quit after 5.00 years of use. She has never used  smokeless tobacco. She reports that she drinks alcohol. She reports that she does not use drugs.  ALLERGIES: Patient has no known allergies.  MEDICATIONS:  Current Outpatient Prescriptions  Medication Sig Dispense Refill  . albuterol (PROVENTIL  HFA;VENTOLIN HFA) 108 (90 Base) MCG/ACT inhaler Inhale into the lungs.    Marland Kitchen ipratropium (ATROVENT) 0.03 % nasal spray Place 2 sprays into the nose 2 (two) times daily. 30 mL 0  . ipratropium-albuterol (DUONEB) 0.5-2.5 (3) MG/3ML SOLN Take 3 mLs by nebulization every 6 (six) hours as needed. 360 mL 3  . nystatin (MYCOSTATIN/NYSTOP) powder Apply topically 3 (three) times daily. Apply to affected area for up to 7 days 15 g 2  . sertraline (ZOLOFT) 50 MG tablet Take 1 tablet (50 mg total) by mouth daily. 90 tablet 3   No current facility-administered medications for this encounter.     REVIEW OF SYSTEMS:  On review of systems the patient reports shortness of breath when walking, bruises easily, back pain, and diabetes. She denies difficulties associated with her general constitution, eyes, ENT, heart, bowels, urine, nervous system, mental health, blood/lymph system, or immune systems. Pertinent items noted in HPI and remainder of comprehensive ROS otherwise negative.   PHYSICAL EXAM:  Vitals with BMI 08/26/2016  Height 5' 1.75"  Weight 336 lbs  BMI 32.3  Systolic 557  Diastolic 63  Pulse 78  Respirations 19  General: Alert and oriented, in no acute distress HEENT: Head is normocephalic. Extraocular movements are intact. Oropharynx is clear. Neck: Neck is supple, no palpable cervical or supraclavicular lymphadenopathy. Heart: Regular in rate and rhythm with no murmurs, rubs, or gallops. Chest: Clear to auscultation bilaterally, with no rhonchi, wheezes, or rales. Extremities: No cyanosis or edema. Lymphatics: see Neck Exam. Axilla negative for adenopathy. Skin: No concerning lesions. Musculoskeletal: symmetric strength and muscle tone throughout. Neurologic: Cranial nerves II through XII are grossly intact. No obvious focalities. Speech is fluent. Coordination is intact. Psychiatric: Judgment and insight are intact. Affect is appropriate. Breast: Right breast no palpable mass or nipple  discharge. The patient was noted to have very dry skin. Left breast bruising in the nipple areolar region, no palpable mass, no nipple discharge or bleeding.  ECOG = 1 LABORATORY DATA:  Lab Results  Component Value Date   WBC 10.4 (H) 08/26/2016   HGB 13.9 08/26/2016   HCT 44.8 08/26/2016   MCV 86.8 08/26/2016   PLT 303 08/26/2016   NEUTROABS 6.9 (H) 08/26/2016   Lab Results  Component Value Date   NA 142 08/26/2016   K 4.0 08/26/2016   CL 101 07/05/2013   CO2 28 08/26/2016   GLUCOSE 128 08/26/2016   CREATININE 0.8 08/26/2016   CALCIUM 9.7 08/26/2016      RADIOGRAPHY: US Breast Ltd Uni Left Inc Axilla  Result Date: 08/17/2016 CLINICAL DATA:  Screening recall for left breast distortion. EXAM: 2D DIGITAL DIAGNOSTIC UNILATERAL LEFT MAMMOGRAM WITH CAD AND ADJUNCT TOMO LEFT BREAST ULTRASOUND COMPARISON:  Previous exam(s). ACR Breast Density Category c: The breast tissue is heterogeneously dense, which may obscure small masses. FINDINGS: Spot compression CC and MLO tomograms were performed of the retroareolar left breast demonstrating a mass with associated distortion measuring approximately 9 mm. Mammographic images were processed with CAD. Physical examination of the slightly inner/retroareolar left breast reveals an area of nodularity. Targeted ultrasound of the left breast was performed demonstrating an irregular shadowing mass at 8 o'clock retroareolar measuring 0.8 x 0.9 x 0.8 cm. This corresponds well with the mass seen  at mammography. Additional areas of fibrocystic change/clusters of cysts are seen in the retroareolar left breast. No lymphadenopathy seen in the left axilla. IMPRESSION: Suspicious left breast mass. RECOMMENDATION: Ultrasound-guided biopsy of the mass in the left breast at 8 o'clock retroareolar is recommended. This is scheduled for Tuesday 07/18/2016 at 1:45 p.m. I have discussed the findings and recommendations with the patient. Results were also provided in writing at  the conclusion of the visit. If applicable, a reminder letter will be sent to the patient regarding the next appointment. BI-RADS CATEGORY  4: Suspicious. Electronically Signed   By: Everlean Alstrom M.D.   On: 08/17/2016 10:19   Mm Diag Breast Tomo Uni Left  Result Date: 08/17/2016 CLINICAL DATA:  Screening recall for left breast distortion. EXAM: 2D DIGITAL DIAGNOSTIC UNILATERAL LEFT MAMMOGRAM WITH CAD AND ADJUNCT TOMO LEFT BREAST ULTRASOUND COMPARISON:  Previous exam(s). ACR Breast Density Category c: The breast tissue is heterogeneously dense, which may obscure small masses. FINDINGS: Spot compression CC and MLO tomograms were performed of the retroareolar left breast demonstrating a mass with associated distortion measuring approximately 9 mm. Mammographic images were processed with CAD. Physical examination of the slightly inner/retroareolar left breast reveals an area of nodularity. Targeted ultrasound of the left breast was performed demonstrating an irregular shadowing mass at 8 o'clock retroareolar measuring 0.8 x 0.9 x 0.8 cm. This corresponds well with the mass seen at mammography. Additional areas of fibrocystic change/clusters of cysts are seen in the retroareolar left breast. No lymphadenopathy seen in the left axilla. IMPRESSION: Suspicious left breast mass. RECOMMENDATION: Ultrasound-guided biopsy of the mass in the left breast at 8 o'clock retroareolar is recommended. This is scheduled for Tuesday 07/18/2016 at 1:45 p.m. I have discussed the findings and recommendations with the patient. Results were also provided in writing at the conclusion of the visit. If applicable, a reminder letter will be sent to the patient regarding the next appointment. BI-RADS CATEGORY  4: Suspicious. Electronically Signed   By: Everlean Alstrom M.D.   On: 08/17/2016 10:19   Mm Screening Breast Tomo Bilateral  Result Date: 08/11/2016 CLINICAL DATA:  Screening. EXAM: 2D DIGITAL SCREENING BILATERAL MAMMOGRAM WITH  CAD AND ADJUNCT TOMO COMPARISON:  Previous exam(s). ACR Breast Density Category c: The breast tissue is heterogeneously dense, which may obscure small masses. FINDINGS: In the left breast, possible distortion warrants further evaluation. This possible distortion is seen within the slightly inner left breast, at anterior depth, tomosynthesis CC slice 53 and MLO slice 40. In the right breast, no findings suspicious for malignancy. Images were processed with CAD. IMPRESSION: Further evaluation is suggested for possible distortion in the left breast. RECOMMENDATION: Diagnostic mammogram and possibly ultrasound of the left breast. (Code:[id number]-[id number]) The patient will be contacted regarding the findings, and additional imaging will be scheduled. BI-RADS CATEGORY  0: Incomplete. Need additional imaging evaluation and/or prior mammograms for comparison. Electronically Signed   By: Franki Cabot M.D.   On: 08/11/2016 10:21   Mm Clip Placement Left  Result Date: 08/18/2016 CLINICAL DATA:  Confirmation of clip placement after ultrasound-guided core needle biopsy of a suspicious mass in the lower inner subareolar left breast associated with architectural distortion on screening mammography with tomosynthesis. EXAM: 3D DIAGNOSTIC LEFT MAMMOGRAM POST ULTRASOUND BIOPSY COMPARISON:  Previous exam(s). FINDINGS: 3D Mammographic images were obtained following ultrasound guided biopsy of a suspicious mass associated with architectural distortion in the lower inner subareolar left breast. The ribbon shaped tissue marker clip is appropriately positioned at the anterior and lateral  margin of the biopsied mass, within the architectural distortion. Expected post biopsy changes are present without evidence of hematoma. IMPRESSION: Appropriate positioning of the ribbon shaped tissue marker clip within the biopsied mass and within the architectural distortion in the lower inner subareolar left breast. Final Assessment: Post Procedure  Mammograms for Marker Placement Electronically Signed   By: Evangeline Dakin M.D.   On: 08/18/2016 14:49   [place] Lt Breast Bx W Loc Dev 1st Lesion Img Bx Spec US Guide  Addendum Date: 08/19/2016   ADDENDUM REPORT: 08/19/2016 15:17 ADDENDUM: Pathology revealed HIGH GRADE INVASIVE DUCTAL CARCINOMA, HIGH GRADE DUCTAL CARCINOMA IN SITU of the Left breast, lower inner quadrant, 8:00 o'clock retroareolar. This was found to be concordant by Dr. Peggye Fothergill. Pathology results were discussed with the patient by telephone. The patient reported doing well after the biopsy with tenderness at the site. Post biopsy instructions and care were reviewed and questions were answered. The patient was encouraged to call [facility] for any additional concerns. The patient was referred to [facility] on August 26, 2016. Pathology results reported by Terie Purser, RN on 08/19/2016. Electronically Signed   By: Evangeline Dakin M.D.   On: 08/19/2016 15:17   Result Date: 08/19/2016 CLINICAL DATA:  Tomosynthesis screening detected approximate 1.3 cm suspicious mass associated with architectural distortion in the lower inner quadrant of the left breast at the 8 o'clock subareolar position. EXAM: ULTRASOUND GUIDED LEFT BREAST CORE NEEDLE BIOPSY COMPARISON:  Previous exam(s). FINDINGS: I met with the patient and we discussed the procedure of ultrasound-guided biopsy, including benefits and alternatives. We discussed the high likelihood of a successful procedure. We discussed the risks of the procedure, including infection, bleeding, tissue injury, clip migration, and inadequate sampling. Informed written consent was given. The usual time-out protocol was performed immediately prior to the procedure. Using sterile technique with chlorhexidine as skin antisepsis, 1% Lidocaine as local anesthetic, under direct ultrasound visualization, a  12 gauge Bard Marquee core needle device was used to perform biopsy of the mass in the lower inner subareolar left breast using an inferolateral approach. At the conclusion of the procedure a ribbon shaped tissue marker clip was deployed into the biopsy cavity. Follow up 2 view mammogram was performed and dictated separately. IMPRESSION: Ultrasound guided biopsy of a suspicious 1.3 cm mass associated with architectural distortion in the lower inner subareolar left breast. No apparent complications. Electronically Signed: By: Evangeline Dakin M.D. On: 08/18/2016 14:32      IMPRESSION: Clinical stage IA (cT1bN0) high grade invasive ductal carcinoma of the left breast (triple positive)  The patient appears to be a good candidate for breast conservation therapy. Radiation would be given to the left breast post-surgically to further reduce the risk of recurrence.  I spoke to the patient today regarding her diagnosis and options for treatment. We discussed the equivalence in terms of survival and local failure between mastectomy and breast conservation. We discussed the role of radiation in decreasing local failures in patients who undergo lumpectomy. We discussed the process of CT simulation and the placement tattoos. We discussed 4-6 weeks of treatment as an outpatient. We discussed the possibility of asymptomatic lung damage. We discussed the low likelihood of secondary malignancies. We discussed the possible side effects including but not limited to skin redness, fatigue, permanent skin darkening, and breast swelling.  We discussed the use of cardiac sparing with deep inspiration breath hold if  needed.  PLAN: She will undergo a bilateral breast MRI to observe the extent of disease and this is scheduled on 08/28/16. The patient will have surgery consisting of a left lumpectomy, sentinel lymph node procedure, and placement of a port. She will then undergo adjuvant chemotherapy. The patient will return to see me  after chemotherapy to further discuss radiation. After the patient completes radiation, she will be placed on an aromatase inhibitor.     ------------------------------------------------  Blair Promise, PhD, MD  This document serves as a record of services personally performed by Gery Pray, MD. It was created on his behalf by Darcus Austin, a trained medical scribe. The creation of this record is based on the scribe's personal observations and the provider's statements to them. This document has been checked and approved by the attending provider.

## 2016-08-26 NOTE — Assessment & Plan Note (Signed)
Left breast biopsy 08/18/2016: 8:00 retroareolar position: IDC, high-grade with DCIS high-grade, ER 90%, PR 95%, Ki-67 20%, HER-2 positive ratio 3.4; screening detected left breast distortion LIQ 8 8:00 retroareolar 0.8 cm, axilla negative, T1b N0 stage IA clinical stage  Pathology and radiology counseling: Discussed with the patient, the details of pathology including the type of breast cancer,the clinical staging, the significance of ER, PR and HER-2/neu receptors and the implications for treatment. After reviewing the pathology in detail, we proceeded to discuss the different treatment options between surgery, radiation, chemotherapy, antiestrogen therapies.  Recommendation: 1. Breast conserving surgery 2. followed by adjuvant chemotherapy and Herceptin with weekly Taxol and Herceptin 12 followed by Herceptin maintenance for 1 year 3. Followed by adjuvant radiation 4. Followed by adjuvant antiestrogen therapy with letrozole 2.5 mg daily 5 years 5. Genetics consult will be obtained. 6. Breast MRI also be ordered.   Chemotherapy counseling: I discussed this and benefits of chemotherapy including the risks and heart as well as neutropenia and neuropathy. Patient understands his risks.  Return to clinic after surgery to discuss the final pathology report and to initiate adjuvant chemotherapy.

## 2016-08-26 NOTE — Therapy (Signed)
[place], [place], [address] Phone: [phone]   Fax:  [phone]  Physical Therapy Evaluation  Patient Details  Name: Sandra Wood MRN: [id number] Date of Birth: 08/19/1955 Referring Provider: Dr. Alphonsa Overall  Encounter Date: 08/26/2016      PT End of Session - 08/26/16 1241    Visit Number 1   Number of Visits 1   PT Start Time 1020   PT Stop Time 1045  Initiated eval and saw her [phone] so total time spent was 30 minutes   PT Time Calculation (min) 25 min   Activity Tolerance Patient tolerated treatment well   Behavior During Therapy [facility] for tasks assessed/performed      Past Medical History:  Diagnosis Date  . Abnormal Pap smear of cervix 2009  . Allergic rhinitis, cause unspecified   . Arthritis   . Arthritis of ankle BILATERAL / HX FX'S  . Asthma, mild persistent   . Fibroids 2008  . GERD (gastroesophageal reflux disease)   . HSV-2 (herpes simplex virus 2) infection    at age 61  . Malignant neoplasm of lower-inner quadrant of left breast in female, estrogen receptor positive ([place]) 08/20/2016  . Obesity, morbid ([place])   . Tear of medial meniscus of knee LEFT  . Type II or unspecified type diabetes mellitus without mention of complication, uncontrolled     Past Surgical History:  Procedure Laterality Date  . CHONDROPLASTY  08/19/2011   Procedure: CHONDROPLASTY;  Surgeon: Magnus Sinning, MD;  Location[facility];  Service: Orthopedics;;  shaving of medial femeral chondral  . HYSTEROSCOPY W/D&C  2008   benign endometrial polyp - Dr. Maryelizabeth Rowan  . KNEE ARTHROSCOPY W/ MENISCECTOMY  07-07-2010  . MENISECTOMY  08/19/2011   Procedure: MENISECTOMY;  Surgeon: Magnus Sinning, MD;  Location: [facility];  Service: Orthopedics;;  partial lateral menisectomy  . TUBAL LIGATION  AGE 61    There were no vitals filed for this visit.       Subjective  Assessment - 08/26/16 1222    Subjective Patient reports she is here today to be seen by her medical team for her newly diagnosed left breast cancer.   Patient is accompained by: Family member   Pertinent History Patient was diagnosed on 08/10/16 with left triple positive high grade breast cancer. It measures 8 mm and is located in the lower inner quadrant with a Ki67 of 20%. She has had previous bilateral knee scopes in 2012 and 2013. She has diabetes and morbid obesity with a BMI of 62 which significantly increases her risk of lymphedema.   Patient Stated Goals reduce lymphedema risk and learn post op shoulder ROM HEP   Currently in Pain? No/denies            [facility] PT Assessment - 08/26/16 0001      Assessment   Medical Diagnosis Left breast cancer.   Referring Provider Dr. Alphonsa Overall   Onset Date/Surgical Date 08/10/16   Hand Dominance Right   Prior Therapy none     Precautions   Precautions Other (comment)   Precaution Comments active cancer, diabetes, morbid obesity     Restrictions   Weight Bearing Restrictions No     Balance Screen   Has the patient fallen in the past 6 months No   Has the patient had a decrease in activity level because of a fear of falling?  No   Is the  patient reluctant to leave their home because of a fear of falling?  No     Home Environment   Living Environment Private residence   Living Arrangements Other relatives  61 y.o. 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; rides on scooter supervising people   Leisure She does not exercise     Cognition   Overall Cognitive Status Within Functional Limits for tasks assessed     Posture/Postural Control   Posture/Postural Control Postural limitations   Postural Limitations Forward head;Rounded Shoulders     ROM / Strength   AROM / PROM / Strength  AROM;Strength     AROM   AROM Assessment Site Shoulder;Cervical   Right/Left Shoulder Right;Left   Right Shoulder Extension 43 Degrees   Right Shoulder Flexion 125 Degrees   Right Shoulder ABduction 133 Degrees   Right Shoulder Internal Rotation 64 Degrees   Right Shoulder External Rotation 86 Degrees   Left Shoulder Extension 46 Degrees   Left Shoulder Flexion 150 Degrees   Left Shoulder ABduction 122 Degrees   Left Shoulder Internal Rotation 76 Degrees   Left Shoulder External Rotation 79 Degrees   Cervical Flexion WNL   Cervical Extension WNL   Cervical - Right Side Bend WNL   Cervical - Left Side Bend WNL   Cervical - Right Rotation WNL   Cervical - Left Rotation WNL     Strength   Overall Strength Within functional limits for tasks performed           LYMPHEDEMA/ONCOLOGY QUESTIONNAIRE - 08/26/16 1237      Type   Cancer Type Left breast cancer     Lymphedema Assessments   Lymphedema Assessments Upper extremities     Right Upper Extremity Lymphedema   10 cm Proximal to Olecranon Process 50.8 cm   Olecranon Process 35.8 cm   10 cm Proximal to Ulnar Styloid Process 34 cm   Just Proximal to Ulnar Styloid Process 23.8 cm   Across Hand at [employer] 20.6 cm   At [place] of 2nd Digit 7.4 cm     Left Upper Extremity Lymphedema   10 cm Proximal to Olecranon Process 52.2 cm   Olecranon Process 37.2 cm   10 cm Proximal to Ulnar Styloid Process 34.3 cm   Just Proximal to Ulnar Styloid Process 22.3 cm   Across Hand at [employer] 20.8 cm   At [place] of 2nd Digit 7.3 cm       Patient was instructed today in a home exercise program today for post op shoulder range of motion. These included active assist shoulder flexion in sitting, scapular retraction, wall walking with shoulder abduction, and hands behind head external rotation.  She was encouraged to do these twice a day, holding 3 seconds and repeating 5 times when permitted by her physician.         PT  Education - 08/26/16 1240    Education provided Yes   Education Details Lymphedema risk reduction and post op shoulder ROM HEP   Person(s) Educated Patient;Parent(s)   Methods Explanation;Demonstration;Handout   Comprehension Returned demonstration;Verbalized understanding              Breast Clinic Goals - 08/26/16 1252      Patient will be able to verbalize understanding of pertinent lymphedema risk reduction practices relevant to her  diagnosis specifically related to skin care.   Time 1   Period Days   Status Achieved     Patient will be able to return demonstrate and/or verbalize understanding of the post-op home exercise program related to regaining shoulder range of motion.   Time 1   Period Days   Status Achieved     Patient will be able to verbalize understanding of the importance of attending the postoperative After Breast Cancer Class for further lymphedema risk reduction education and therapeutic exercise.   Time 1   Period Days   Status Achieved              Plan - 08/26/16 1242    Clinical Impression Statement Patient was diagnosed on 08/10/16 with left triple positive high grade breast cancer. It measures 8 mm and is located in the lower inner quadrant with a Ki67 of 20%. She has had previous bilateral knee scopes in 2012 and 2013. She has diabetes and morbid obesity with a BMI of 62 which significantly increases her risk of lymphedema. Her multidisciplinary medical team met prior to her assessments to determine a recommended treatmet plan. She is planning to have a left lumpectomy and sentinel node biopsy followed by chemotherapy, radiation, and anti-estrogen therapy. She may benefit from post op PT to regain shoulder ROM which is already limited and to reduce her lymphedema risk. Due to her comorbidities of morbid obesity and difficulty ambulating from knee issues, her eval is of moderate complexity. Those comorbidities will likley impact rehab potential.    Rehab Potential Excellent   Clinical Impairments Affecting Rehab Potential Above stated comorbidities   PT Frequency One time visit   PT Treatment/Interventions Patient/family education;Therapeutic exercise   PT Next Visit Plan Will f/u after surgery to determine PT needs   PT Home Exercise Plan post op shoulder ROM HEP   Consulted and Agree with Plan of Care Patient;Family member/caregiver   Family Member Consulted mother      Patient will benefit from skilled therapeutic intervention in order to improve the following deficits and impairments:  Postural dysfunction, Decreased knowledge of precautions, Pain, Impaired UE functional use, Decreased range of motion  Visit Diagnosis: Carcinoma of lower-inner quadrant of left breast in female, estrogen receptor positive ([place]) - Plan: PT plan of care cert/re-cert  Abnormal posture - Plan: PT plan of care cert/re-cert  Difficulty in walking, not elsewhere classified - Plan: PT plan of care cert/re-cert   Patient will follow up at outpatient cancer rehab if needed following surgery.  If the patient requires physical therapy at that time, a specific plan will be dictated and sent to the referring physician for approval. The patient was educated today on appropriate basic range of motion exercises to begin post operatively and the importance of attending the After Breast Cancer class following surgery.  Patient was educated today on lymphedema risk reduction practices as it pertains to recommendations that will benefit the patient immediately following surgery.  She verbalized good understanding.  No additional physical therapy is indicated at this time.      Problem List Patient Active Problem List   Diagnosis Date Noted  . Malignant neoplasm of lower-inner quadrant of left breast in female, estrogen receptor positive ([place]) 08/20/2016  . Depression, recurrent ([place]) 07/28/2016  . Routine general medical examination at a health care facility  06/02/2012  . Abnormal Pap smear of cervix 06/02/2012  . Other screening mammogram 06/02/2012  . Allergic rhinitis, cause unspecified 05/04/2012  . Type II  or unspecified type diabetes mellitus without mention of complication, uncontrolled 04/03/2012  . Obesity, morbid ([place]) 03/31/2012  . Asthma, mild persistent 01/27/2012  . GERD (gastroesophageal reflux disease) 12/30/2011  . Preventative health care 12/29/2011   Annia Friendly, PT 08/26/16 1:10 PM  [place], [place], [address] Phone: [phone]   Fax:  [phone]  Name: Sandra Wood MRN: [id number] Date of Birth: 01/13/1956

## 2016-08-27 NOTE — Telephone Encounter (Signed)
Patient called back.  Pt has appt for Echo at 8 am and f/u with Dr. Aundra Dubin at 9 am.  New patient packet was mailed to patient.

## 2016-08-28 ENCOUNTER — Other Ambulatory Visit: Payer: Self-pay | Admitting: *Deleted

## 2016-08-28 ENCOUNTER — Ambulatory Visit (HOSPITAL_COMMUNITY): Admission: RE | Admit: 2016-08-28 | Payer: No Typology Code available for payment source | Source: Ambulatory Visit

## 2016-08-28 DIAGNOSIS — C50312 Malignant neoplasm of lower-inner quadrant of left female breast: Secondary | ICD-10-CM

## 2016-08-28 DIAGNOSIS — Z170 Estrogen receptor positive status [ER+]: Principal | ICD-10-CM

## 2016-09-01 ENCOUNTER — Telehealth: Payer: Self-pay | Admitting: *Deleted

## 2016-09-01 NOTE — Telephone Encounter (Signed)
  Oncology Nurse Navigator Documentation  Navigator Location: [facility] (09/01/16 1100)   )Navigator Encounter Type: Telephone (09/01/16 1100) Telephone: Outgoing Call;Clinic/[facility] Follow-up (09/01/16 1100)                                                  Time Spent with Patient: 15 (09/01/16 1100)

## 2016-09-02 ENCOUNTER — Ambulatory Visit
Admission: RE | Admit: 2016-09-02 | Discharge: 2016-09-02 | Disposition: A | Discharge status: Home / Self Care | Payer: No Typology Code available for payment source | Source: Ambulatory Visit | Attending: Surgery | Admitting: Surgery

## 2016-09-02 DIAGNOSIS — C50312 Malignant neoplasm of lower-inner quadrant of left female breast: Secondary | ICD-10-CM

## 2016-09-02 DIAGNOSIS — Z170 Estrogen receptor positive status [ER+]: Principal | ICD-10-CM

## 2016-09-02 MED ORDER — GADOBENATE DIMEGLUMINE 529 MG/ML IV SOLN
20.0000 mL | Freq: Once | INTRAVENOUS | Status: AC | PRN
  Administered 2016-09-02: 20 mL via INTRAVENOUS

## 2016-09-03 ENCOUNTER — Other Ambulatory Visit: Payer: No Typology Code available for payment source

## 2016-09-07 ENCOUNTER — Other Ambulatory Visit: Payer: Self-pay | Admitting: Surgery

## 2016-09-07 DIAGNOSIS — C50912 Malignant neoplasm of unspecified site of left female breast: Secondary | ICD-10-CM

## 2016-09-07 DIAGNOSIS — Z170 Estrogen receptor positive status [ER+]: Principal | ICD-10-CM

## 2016-09-15 ENCOUNTER — Ambulatory Visit (HOSPITAL_COMMUNITY)
Admission: RE | Admit: 2016-09-15 | Discharge: 2016-09-15 | Disposition: A | Discharge status: Home / Self Care | Payer: No Typology Code available for payment source | Source: Ambulatory Visit | Attending: Internal Medicine | Admitting: Internal Medicine

## 2016-09-15 ENCOUNTER — Encounter (HOSPITAL_COMMUNITY)
Admission: RE | Admit: 2016-09-15 | Discharge: 2016-09-15 | Disposition: A | Discharge status: Home / Self Care | Payer: No Typology Code available for payment source | Source: Ambulatory Visit | Attending: Surgery | Admitting: Surgery

## 2016-09-15 ENCOUNTER — Other Ambulatory Visit: Payer: Self-pay

## 2016-09-15 ENCOUNTER — Encounter (HOSPITAL_COMMUNITY): Payer: Self-pay

## 2016-09-15 ENCOUNTER — Ambulatory Visit (HOSPITAL_BASED_OUTPATIENT_CLINIC_OR_DEPARTMENT_OTHER)
Admission: RE | Admit: 2016-09-15 | Discharge: 2016-09-15 | Disposition: A | Discharge status: Home / Self Care | Payer: No Typology Code available for payment source | Source: Ambulatory Visit | Attending: Cardiology | Admitting: Cardiology

## 2016-09-15 VITALS — BP 158/76 | HR 87 | Wt 332.5 lb

## 2016-09-15 DIAGNOSIS — Z170 Estrogen receptor positive status [ER+]: Secondary | ICD-10-CM

## 2016-09-15 DIAGNOSIS — I501 Left ventricular failure: Secondary | ICD-10-CM | POA: Diagnosis not present

## 2016-09-15 DIAGNOSIS — C50312 Malignant neoplasm of lower-inner quadrant of left female breast: Secondary | ICD-10-CM | POA: Diagnosis not present

## 2016-09-15 HISTORY — DX: Pneumonia, unspecified organism: J18.9

## 2016-09-15 HISTORY — DX: Dyspnea, unspecified: R06.00

## 2016-09-15 LAB — BASIC METABOLIC PANEL
Anion gap: 11 (ref 5–15)
BUN: 8 mg/dL (ref 6–20)
CO2: 23 mmol/L (ref 22–32)
Calcium: 9 mg/dL (ref 8.9–10.3)
Chloride: 103 mmol/L (ref 101–111)
Creatinine, Ser: 0.8 mg/dL (ref 0.44–1.00)
GFR calc Af Amer: >60 mL/min (ref >60)
GFR calc non Af Amer: >60 mL/min (ref >60)
Glucose, Bld: 217 mg/dL — ABNORMAL HIGH (ref 65–99)
Potassium: 3.7 mmol/L (ref 3.5–5.1)
Sodium: 137 mmol/L (ref 135–145)

## 2016-09-15 LAB — CBC
HCT: 45.2 % (ref 36.0–46.0)
Hemoglobin: 14.4 g/dL (ref 12.0–15.0)
MCH: 27.3 pg (ref 26.0–34.0)
MCHC: 31.9 g/dL (ref 30.0–36.0)
MCV: 85.6 fL (ref 78.0–100.0)
Platelets: 304 10*3/uL (ref 150–400)
RBC: 5.28 MIL/uL — ABNORMAL HIGH (ref 3.87–5.11)
RDW: 14.9 % (ref 11.5–15.5)
WBC: 14.2 10*3/uL — ABNORMAL HIGH (ref 4.0–10.5)

## 2016-09-15 LAB — GLUCOSE, CAPILLARY: Glucose-Capillary: 209 mg/dL — ABNORMAL HIGH (ref 65–99)

## 2016-09-15 MED ORDER — PERFLUTREN LIPID MICROSPHERE
1.0000 mL | INTRAVENOUS | Status: AC | PRN
  Administered 2016-09-15: 3 mL via INTRAVENOUS
  Filled 2016-09-15: qty 10

## 2016-09-15 NOTE — Patient Instructions (Signed)
Follow up and Echo with Dr.McLean in 3months  

## 2016-09-15 NOTE — Progress Notes (Signed)
PIV 24 g x 1 attempt inserted in LPAC for definity echo per Dr. Aundra Dubin order. Patient tolerated well. Echo tech to remove PIV once definity echo completed.  Renee Pain, RN

## 2016-09-15 NOTE — Progress Notes (Signed)
Oncology: Dr. Lindi Wood  61 yo with history of DM2 and asthma has developed breast cancer.  She presents for cardio-oncology clinic evaluation.  Breast cancer is on left, ER+/PR+/HER2+.  She will have definitive surgery followed by Taxol + Herceptin x 12 cycles then Herceptin to complete a full year.   No known cardiac problems. No chest pain.  No exertional dyspnea.   PMH: 1. Asthma: Mild persistent.  2. Type II diabetes. 3. GERD 4. Breast cancer: On left.  ER+/PR+/HER2+.  She will have definitive surgery followed by Taxol + Herceptin x 12 cycles then Herceptin to complete a full year.  - Echo (3/18): EF 60-65%, normal RV size and systolic function, strain imaging not done.   Social History   Social History  . Marital status: Divorced    Spouse name: N/A  . Number of children: N/A  . Years of education: N/A   Occupational History  . Not on file.   Social History Main Topics  . Smoking status: Former Smoker    Years: 5.00    Types: Cigarettes    Quit date: 08/13/1986  . Smokeless tobacco: Never Used  . Alcohol use Yes     Comment: OCC.  . Drug use: No  . Sexual activity: Not Currently    Birth control/ protection: Post-menopausal, Abstinence   Other Topics Concern  . Not on file   Social History Narrative  . No narrative on file    Family History  Problem Relation Age of Onset  . Diabetes Mother   . Heart disease Father   . Cancer Brother 73    prostate  . Breast cancer Maternal Grandmother 88   ROS: All systems reviewed and negative except as per HPI.   Current Outpatient Prescriptions  Medication Sig Dispense Refill  . sertraline (ZOLOFT) 50 MG tablet Take 1 tablet (50 mg total) by mouth daily. (Patient taking differently: Take 50 mg by mouth daily as needed. ) 90 tablet 3  . ibuprofen (ADVIL,MOTRIN) 200 MG tablet Take 400-600 mg by mouth every 6 (six) hours as needed for headache or mild pain (depends on pain if takes 2-3 tablets).     Marland Kitchen ipratropium-albuterol  (DUONEB) 0.5-2.5 (3) MG/3ML SOLN Inhale 3 mLs into the lungs every 4 (four) hours as needed (shortness of breath).     Marland Kitchen PROAIR HFA 108 (90 Base) MCG/ACT inhaler Inhale 2 puffs into the lungs every 6 (six) hours as needed for wheezing or shortness of breath.      No current facility-administered medications for this encounter.    BP (!) 158/76   Pulse 87   Wt (!) 332 lb 8 oz (150.8 kg)   LMP 06/30/2003   SpO2 94%   BMI 61.31 kg/m  General: NAD Neck: No JVD, no thyromegaly or thyroid nodule.  Lungs: Clear to auscultation bilaterally with normal respiratory effort. CV: Nondisplaced PMI.  Heart regular S1/S2, no S3/S4, no murmur.  No peripheral edema.  No carotid bruit.  Normal pedal pulses.  Abdomen: Soft, nontender, no hepatosplenomegaly, no distention.  Skin: Intact without lesions or rashes.  Neurologic: Alert and oriented x 3.  Psych: Normal affect. Extremities: No clubbing or cyanosis.  HEENT: Normal.   Assessment/Plan: 61 yo with breast cancer, she will be getting Herceptin-based therapy.  We discussed the risk of cardio-toxicity with Herceptin and the rationale behind echo screening.  I reviewed today's echo, it appears normal (though strain was not done).  - Repeat echo in 3 months with office visit.  Loralie Champagne 09/15/2016

## 2016-09-15 NOTE — Pre-Procedure Instructions (Signed)
Sandra Wood  09/15/2016      [id number] - [place], [place] - [place] [address] [place] [address] Phone: [phone] Fax: [phone]    Your procedure is scheduled on March 27.  Report to [facility] at [phone]  Call this number if you have problems the morning of surgery:  [phone]   Remember:  Do not eat food or drink liquids after midnight.  Take these medicines the morning of surgery with A SIP OF WATER Duoneb if needed, Proair inhaler if needed- bring your inhalers with you on the day of surgery, sertraline (Zoloft)  Stop taking aspirin, BC's, Goody's, herbal medications, Fish Oil, Ibuprofen, Advil, Motrin, Aleve, Vitamins    Do not wear jewelry, make-up or nail polish.  Do not wear lotions, powders, or perfumes, or deoderant.  Do not shave 48 hours prior to surgery.  Men may shave face and neck.  Do not bring valuables to the hospital.  [facility] is not responsible for any belongings or valuables.  Contacts, dentures or bridgework may not be worn into surgery.  Leave your suitcase in the car.  After surgery it may be brought to your room.  For patients admitted to the hospital, discharge time will be determined by your treatment team.  Patients discharged the day of surgery will not be allowed to drive home.    Special instructions:  Sandra Wood - Preparing for Surgery  Before surgery, you can play an important role.  Because skin is not sterile, your skin needs to be as free of germs as possible.  You can reduce the number of germs on you skin by washing with CHG (chlorahexidine gluconate) soap before surgery.  CHG is an antiseptic cleaner which kills germs and bonds with the skin to continue killing germs even after washing.  Please DO NOT use if you have an allergy to CHG or antibacterial soaps.  If your skin becomes reddened/irritated stop using the CHG and  inform your nurse when you arrive at Short Stay.  Do not shave (including legs and underarms) for at least 48 hours prior to the first CHG shower.  You may shave your face.  Please follow these instructions carefully:   1.  Shower with CHG Soap the night before surgery and the                                morning of Surgery.  2.  If you choose to wash your hair, wash your hair first as usual with your       normal shampoo.  3.  After you shampoo, rinse your hair and body thoroughly to remove the                      Shampoo.  4.  Use CHG as you would any other liquid soap.  You can apply chg directly       to the skin and wash gently with scrungie or a clean washcloth.  5.  Apply the CHG Soap to your body ONLY FROM THE NECK DOWN.        Do not use on open wounds or open sores.  Avoid contact with your eyes,       ears, mouth and genitals (private parts).  Wash genitals (private parts)  with your normal soap.  6.  Wash thoroughly, paying special attention to the area where your surgery        will be performed.  7.  Thoroughly rinse your body with warm water from the neck down.  8.  DO NOT shower/wash with your normal soap after using and rinsing off       the CHG Soap.  9.  Pat yourself dry with a clean towel.            10.  Wear clean pajamas.            11.  Place clean sheets on your bed the night of your first shower and do not        sleep with pets.  Day of Surgery  Do not apply any lotions/deoderants the morning of surgery.  Please wear clean clothes to the hospital/surgery center.    Please read over the following fact sheets that you were given. Pain Booklet, Coughing and Deep Breathing and Surgical Site Infection Prevention

## 2016-09-15 NOTE — Progress Notes (Signed)
PCP is Dr Scarlette Calico Denies ever seeing a cardiologist. Denies ever having a card cath or stress test. Echo done today 09-15-16 Reports that she doesn't check her CBG's at home- manages her Dm with diet.

## 2016-09-16 LAB — HEMOGLOBIN A1C
Hgb A1c MFr Bld: 7.6 % — ABNORMAL HIGH (ref 4.8–5.6)
MEAN PLASMA GLUCOSE: 171 mg/dL

## 2016-09-18 ENCOUNTER — Ambulatory Visit
Admission: RE | Admit: 2016-09-18 | Discharge: 2016-09-18 | Disposition: A | Discharge status: Home / Self Care | Payer: No Typology Code available for payment source | Source: Ambulatory Visit | Attending: Surgery | Admitting: Surgery

## 2016-09-18 DIAGNOSIS — C50912 Malignant neoplasm of unspecified site of left female breast: Secondary | ICD-10-CM

## 2016-09-18 DIAGNOSIS — Z170 Estrogen receptor positive status [ER+]: Principal | ICD-10-CM

## 2016-09-21 ENCOUNTER — Other Ambulatory Visit: Payer: No Typology Code available for payment source

## 2016-09-21 MED ORDER — CEFAZOLIN SODIUM 10 G IJ SOLR
3.0000 g | INTRAMUSCULAR | Status: AC
  Administered 2016-09-22: 3 g via INTRAVENOUS
  Filled 2016-09-21: qty 3000

## 2016-09-21 NOTE — H&P (Signed)
Sandra Wood  Location: [facility] Patient #: [id number] DOB: 1955/11/04 Undefined / Language: Sandra Wood / Race: White Female  History of Present Illness   The patient is a 61 year old female who presents with a complaint of Left breast cancer.   The PCP is Dr. Scarlette Calico  She is at the [facility] - Drs. Lindi Adie and Kinard  The patient was referred by Dr. Everlean Alstrom.  She comes with her mother, Sandra Wood.  Her last mammogram was at least 3 years ago. Her maternal great grandmother had breast cancer in her 35's and her paternal grandmother had breast cancer. She went through menopause around age 53. She is not on hormone meds. She had a mammogram at [facility] on 08/17/2016 which showed a mass at 8 o'clock in the left breast measuring 0.8 x 0.9 cm. She had a left breast biopsy on 08/18/2016 [id number]) - IDC, High grade, ER - 90%, PR - 95%, Ki67 - 20%, and Her2Neu positive  I discussed the options for breast cancer treatment with the patient. She is at the Breast multidisciplinary clinic, which includes medical oncology and radiation oncology. I discussed the surgical options of lumpectomy vs. mastectomy. If mastectomy, there is the possibility of reconstruction. I discussed the options of lymph node biopsy. The treatment plan depends on the pathologic staging of the tumor and the patient's personal wishes. The risks of surgery include, but are not limited to, bleeding, infection, the need for further surgery, and nerve injury. The patient has been given literature on the treatment of breast cancer.  Plan: 1) MRI, 2) Left breast lumpectomy (I don't think that I will take her nipple, but will see what MRI shows) with left axillary SLNBx, 2) Power port, 3) Adjuvant chemotx  I discussed the indications and potential complications of the power port placement. The primary complications of the power port, include, but are not  limited to, bleeding, infection, nerve injury, thrombosis, and pneumothorax.  Past Medical History: 1. Morbidly obese 2. Stopped smoking in the 1980's 3. Has never had a colonoscopy  Social History:  Unmarried. She has two children: Sandra Wood ([place]) - 92 yo, and Sandra Wood - 62 yo. She keeps her grandson, who is 6 yo and has seizures. She works at the [employer] - night shift -11PM to [employer]   Past Surgical History Tawni Pummel, RN; 08/26/2016 7:40 AM) Breast Biopsy  Left. Knee Surgery  Bilateral. Oral Surgery  Tonsillectomy   Diagnostic Studies History Tawni Pummel, RN; 08/26/2016 7:40 AM) Colonoscopy  never Mammogram  within last year Pap Smear  1-5 years ago  Medication History Tawni Pummel, RN; 08/26/2016 7:40 AM) Medications Reconciled  Social History Tawni Pummel, RN; 08/26/2016 7:40 AM) Alcohol use  Occasional alcohol use. Caffeine use  Carbonated beverages, Coffee, Tea. Tobacco use  Never smoker.  Family History Tawni Pummel, RN; 08/26/2016 7:40 AM) Arthritis  Mother. Breast Cancer  Family Members In General. Diabetes Mellitus  Mother. Heart Disease  Father. Hypertension  Mother. Melanoma  Father. Prostate Cancer  Brother. Seizure disorder  Family Members In General.  Pregnancy / Birth History Tawni Pummel, RN; 08/26/2016 7:40 AM) Age at menarche  41 years. Age of menopause  33-50 Contraceptive History  Oral contraceptives. Gravida  2 Irregular periods  Maternal age  80-25 Para  2  Other Problems Tawni Pummel, RN; 08/26/2016 7:40 AM) Breast Cancer  Gastroesophageal Reflux Disease  Lump In Breast  Migraine Headache    Review of Systems Sunday [place]  RN; 08/26/2016 7:40 AM) General Not Present- Appetite Loss, Chills, Fatigue, Fever, Night Sweats, Weight Gain and Weight Loss. Skin Present- New Lesions. Not Present- Change in Wart/Mole, Dryness, Hives, Jaundice, Non-Healing Wounds, Rash and Ulcer. HEENT Present-  Seasonal Allergies and Wears glasses/contact lenses. Not Present- Earache, Hearing Loss, Hoarseness, Nose Bleed, Oral Ulcers, Ringing in the Ears, Sinus Pain, Sore Throat, Visual Disturbances and Yellow Eyes. Respiratory Not Present- Bloody sputum, Chronic Cough, Difficulty Breathing, Snoring and Wheezing. Breast Not Present- Breast Mass, Breast Pain, Nipple Discharge and Skin Changes. Cardiovascular Not Present- Chest Pain, Difficulty Breathing Lying Down, Leg Cramps, Palpitations, Rapid Heart Rate, Shortness of Breath and Swelling of Extremities. Gastrointestinal Not Present- Abdominal Pain, Bloating, Bloody Stool, Change in Bowel Habits, Chronic diarrhea, Constipation, Difficulty Swallowing, Excessive gas, Gets full quickly at meals, Hemorrhoids, Indigestion, Nausea, Rectal Pain and Vomiting. Female Genitourinary Not Present- Frequency, Nocturia, Painful Urination, Pelvic Pain and Urgency. Musculoskeletal Not Present- Back Pain, Joint Pain, Joint Stiffness, Muscle Pain, Muscle Weakness and Swelling of Extremities. Endocrine Not Present- Cold Intolerance, Excessive Hunger, Hair Changes, Heat Intolerance, Hot flashes and New Diabetes. Hematology Present- Easy Bruising. Not Present- Blood Thinners, Excessive bleeding, Gland problems, HIV and Persistent Infections.  Physical Exam  General: Morbidly obese WFalert and generally healthy appearing. Skin: Inspection and palpation of the skin unremarkable.  Eyes: Conjunctivae white, pupils equal. Face, ears, nose, mouth, and throat: Face - normal. Normal ears and nose. Lips and teeth normal.  Neck: Supple. No mass. Trachea midline. No thyroid mass.  Lymph Nodes: No supraclavicular or cervical adenopathy. No axillary adenopathy.  Lungs: Normal respiratory effort. Clear to auscultation and symmetric breath sounds. Cardiovascular: Regular rate and rythm. Normal auscultation of the heart. No murmur or rub. Normal carotid  pulse.  Breasts: Right - No mass or nodule Left - nodularity at 8 o'clock edge of left nipple. I don't think this correlates with the location of the breast cancer.  Abdomen: Soft. No mass. Liver and spleen not palpable. No tenderness. No hernia. Normal bowel sounds. Very obese. Rectal: Not done.  Musculoskeletal/extremities: Normal gait. Good strength and ROM in upper and lower extremities.   Neurologic: Grossly intact to motor and sensory function.   Psychiatric: Has normal mood and affect. Judgement and insight appear normal.    Assessment & Plan  1.  MALIGNANT NEOPLASM OF LEFT BREAST, STAGE 1, ESTROGEN RECEPTOR POSITIVE (C50.912)  Story: She had a mammogram at [facility] on 08/17/2016 which showed a mass at 8 o'clock in the left breast measuring 0.8 x 0.9 cm.  She had a left breast biopsy on 08/18/2016 ([id number]) - IDC, High grade, ER - 90%, PR - 95%, Ki67 - 20%, and Her2Neu positive  Oncologist - Gudena/Kinard  Plan:   1) Left breast lumpectomy with left axillary SLNBx,   2) POwer port,   3) Adjuvant chemotx  Addendum Note(Cayton Cuevas H. Lucia Gaskins MD; 09/04/2016 4:42 PM) MRI pushed back becasue of her weight. Her MRI on 09/02/2016 showed nothing new. per MRI, I don't think that I need to remove her nipple, but I will see the day of surgery. Her surgery is scheduled for 09/22/2016.  2.  Morbidly obese  Weight 332, BMI - 61  Alphonsa Overall, MD, [facility] Surgery Pager: [phone] Office phone:  [phone]

## 2016-09-22 ENCOUNTER — Encounter (HOSPITAL_COMMUNITY)
Admission: RE | Admit: 2016-09-22 | Discharge: 2016-09-22 | Disposition: A | Discharge status: Home / Self Care | Payer: No Typology Code available for payment source | Source: Ambulatory Visit | Attending: Surgery | Admitting: Surgery

## 2016-09-22 ENCOUNTER — Ambulatory Visit (HOSPITAL_COMMUNITY): Payer: No Typology Code available for payment source | Admitting: Certified Registered Nurse Anesthetist

## 2016-09-22 ENCOUNTER — Ambulatory Visit (HOSPITAL_COMMUNITY): Payer: No Typology Code available for payment source

## 2016-09-22 ENCOUNTER — Encounter (HOSPITAL_COMMUNITY): Payer: Self-pay

## 2016-09-22 ENCOUNTER — Encounter (HOSPITAL_COMMUNITY)
Admission: RE | Disposition: A | Discharge status: Home / Self Care | Payer: Self-pay | Source: Ambulatory Visit | Attending: Surgery

## 2016-09-22 ENCOUNTER — Ambulatory Visit (HOSPITAL_COMMUNITY)
Admission: RE | Admit: 2016-09-22 | Discharge: 2016-09-22 | Disposition: A | Discharge status: Home / Self Care | Payer: No Typology Code available for payment source | Source: Ambulatory Visit | Attending: Surgery | Admitting: Surgery

## 2016-09-22 ENCOUNTER — Ambulatory Visit
Admission: RE | Admit: 2016-09-22 | Discharge: 2016-09-22 | Disposition: A | Discharge status: Home / Self Care | Payer: No Typology Code available for payment source | Source: Ambulatory Visit | Attending: Surgery | Admitting: Surgery

## 2016-09-22 DIAGNOSIS — Z87891 Personal history of nicotine dependence: Secondary | ICD-10-CM | POA: Insufficient documentation

## 2016-09-22 DIAGNOSIS — E119 Type 2 diabetes mellitus without complications: Secondary | ICD-10-CM | POA: Diagnosis not present

## 2016-09-22 DIAGNOSIS — Z6841 Body Mass Index (BMI) 40.0 and over, adult: Secondary | ICD-10-CM | POA: Insufficient documentation

## 2016-09-22 DIAGNOSIS — C50912 Malignant neoplasm of unspecified site of left female breast: Secondary | ICD-10-CM

## 2016-09-22 DIAGNOSIS — K219 Gastro-esophageal reflux disease without esophagitis: Secondary | ICD-10-CM | POA: Diagnosis not present

## 2016-09-22 DIAGNOSIS — Z95828 Presence of other vascular implants and grafts: Secondary | ICD-10-CM

## 2016-09-22 DIAGNOSIS — F329 Major depressive disorder, single episode, unspecified: Secondary | ICD-10-CM | POA: Insufficient documentation

## 2016-09-22 DIAGNOSIS — Z419 Encounter for procedure for purposes other than remedying health state, unspecified: Secondary | ICD-10-CM

## 2016-09-22 DIAGNOSIS — C50112 Malignant neoplasm of central portion of left female breast: Secondary | ICD-10-CM | POA: Insufficient documentation

## 2016-09-22 DIAGNOSIS — J45909 Unspecified asthma, uncomplicated: Secondary | ICD-10-CM | POA: Diagnosis not present

## 2016-09-22 DIAGNOSIS — Z79899 Other long term (current) drug therapy: Secondary | ICD-10-CM | POA: Insufficient documentation

## 2016-09-22 DIAGNOSIS — Z170 Estrogen receptor positive status [ER+]: Secondary | ICD-10-CM | POA: Diagnosis not present

## 2016-09-22 DIAGNOSIS — Z803 Family history of malignant neoplasm of breast: Secondary | ICD-10-CM | POA: Insufficient documentation

## 2016-09-22 DIAGNOSIS — Z791 Long term (current) use of non-steroidal anti-inflammatories (NSAID): Secondary | ICD-10-CM | POA: Diagnosis not present

## 2016-09-22 HISTORY — PX: BREAST LUMPECTOMY: SHX2

## 2016-09-22 HISTORY — PX: BREAST LUMPECTOMY WITH RADIOACTIVE SEED AND SENTINEL LYMPH NODE BIOPSY: SHX6550

## 2016-09-22 HISTORY — PX: PORTACATH PLACEMENT: SHX2246

## 2016-09-22 LAB — GLUCOSE, CAPILLARY: GLUCOSE-CAPILLARY: 129 mg/dL — AB (ref 65–99)

## 2016-09-22 SURGERY — BREAST LUMPECTOMY WITH RADIOACTIVE SEED AND SENTINEL LYMPH NODE BIOPSY
Anesthesia: Regional | Site: Neck

## 2016-09-22 MED ORDER — MIDAZOLAM HCL 5 MG/5ML IJ SOLN
INTRAMUSCULAR | Status: DC | PRN
  Administered 2016-09-22: 2 mg via INTRAVENOUS

## 2016-09-22 MED ORDER — MIDAZOLAM HCL 2 MG/2ML IJ SOLN
INTRAMUSCULAR | Status: AC
  Filled 2016-09-22: qty 2

## 2016-09-22 MED ORDER — BUPIVACAINE HCL (PF) 0.25 % IJ SOLN
INTRAMUSCULAR | Status: AC
  Filled 2016-09-22: qty 30

## 2016-09-22 MED ORDER — HEPARIN SOD (PORK) LOCK FLUSH 100 UNIT/ML IV SOLN
INTRAVENOUS | Status: AC
  Filled 2016-09-22: qty 5

## 2016-09-22 MED ORDER — CHLORHEXIDINE GLUCONATE CLOTH 2 % EX PADS: 6.0000 | MEDICATED_PAD | Freq: Once | CUTANEOUS | Status: DC

## 2016-09-22 MED ORDER — FENTANYL CITRATE (PF) 100 MCG/2ML IJ SOLN
INTRAMUSCULAR | Status: DC | PRN
  Administered 2016-09-22 (×4): 50 ug via INTRAVENOUS

## 2016-09-22 MED ORDER — HYDROCODONE-ACETAMINOPHEN 5-325 MG PO TABS: 1.0000 | ORAL_TABLET | Freq: Four times a day (QID) | ORAL | 0 refills | Status: DC | PRN

## 2016-09-22 MED ORDER — LIDOCAINE HCL (CARDIAC) 20 MG/ML IV SOLN
INTRAVENOUS | Status: DC | PRN
  Administered 2016-09-22: 100 mg via INTRAVENOUS

## 2016-09-22 MED ORDER — ACETAMINOPHEN 500 MG PO TABS
1000.0000 mg | ORAL_TABLET | ORAL | Status: AC
  Administered 2016-09-22: 1000 mg via ORAL
  Filled 2016-09-22: qty 2

## 2016-09-22 MED ORDER — HEPARIN SOD (PORK) LOCK FLUSH 100 UNIT/ML IV SOLN
INTRAVENOUS | Status: DC | PRN
  Administered 2016-09-22: 400 [IU]

## 2016-09-22 MED ORDER — LACTATED RINGERS IV SOLN
INTRAVENOUS | Status: DC | PRN
  Administered 2016-09-22 (×2): via INTRAVENOUS

## 2016-09-22 MED ORDER — LIDOCAINE HCL (PF) 1 % IJ SOLN
INTRAMUSCULAR | Status: AC
  Filled 2016-09-22: qty 30

## 2016-09-22 MED ORDER — SUGAMMADEX SODIUM 200 MG/2ML IV SOLN
INTRAVENOUS | Status: DC | PRN
  Administered 2016-09-22: 350 mg via INTRAVENOUS

## 2016-09-22 MED ORDER — 0.9 % SODIUM CHLORIDE (POUR BTL) OPTIME
TOPICAL | Status: DC | PRN
  Administered 2016-09-22: 1000 mL

## 2016-09-22 MED ORDER — SUCCINYLCHOLINE CHLORIDE 200 MG/10ML IV SOSY
PREFILLED_SYRINGE | INTRAVENOUS | Status: DC | PRN
  Administered 2016-09-22: 120 mg via INTRAVENOUS

## 2016-09-22 MED ORDER — SUGAMMADEX SODIUM 200 MG/2ML IV SOLN
INTRAVENOUS | Status: AC
  Filled 2016-09-22: qty 4

## 2016-09-22 MED ORDER — PROPOFOL 10 MG/ML IV BOLUS
INTRAVENOUS | Status: AC
  Filled 2016-09-22: qty 20

## 2016-09-22 MED ORDER — METHYLENE BLUE 0.5 % INJ SOLN
INTRAVENOUS | Status: AC
  Filled 2016-09-22: qty 10

## 2016-09-22 MED ORDER — ROCURONIUM BROMIDE 50 MG/5ML IV SOSY
PREFILLED_SYRINGE | INTRAVENOUS | Status: DC | PRN
  Administered 2016-09-22: 20 mg via INTRAVENOUS

## 2016-09-22 MED ORDER — LIDOCAINE HCL (PF) 1 % IJ SOLN
INTRAMUSCULAR | Status: DC | PRN
  Administered 2016-09-22: 10 mL

## 2016-09-22 MED ORDER — PHENYLEPHRINE 40 MCG/ML (10ML) SYRINGE FOR IV PUSH (FOR BLOOD PRESSURE SUPPORT)
PREFILLED_SYRINGE | INTRAVENOUS | Status: DC | PRN
  Administered 2016-09-22: 80 ug via INTRAVENOUS
  Administered 2016-09-22: 40 ug via INTRAVENOUS
  Administered 2016-09-22 (×2): 80 ug via INTRAVENOUS
  Administered 2016-09-22: 40 ug via INTRAVENOUS
  Administered 2016-09-22: 80 ug via INTRAVENOUS

## 2016-09-22 MED ORDER — PHENYLEPHRINE HCL 10 MG/ML IJ SOLN
INTRAVENOUS | Status: DC | PRN
  Administered 2016-09-22: 35 ug/min via INTRAVENOUS

## 2016-09-22 MED ORDER — BUPIVACAINE HCL (PF) 0.5 % IJ SOLN
INTRAMUSCULAR | Status: DC | PRN
  Administered 2016-09-22: 10 mL

## 2016-09-22 MED ORDER — TECHNETIUM TC 99M SULFUR COLLOID FILTERED
1.0000 | Freq: Once | INTRAVENOUS | Status: AC | PRN
  Administered 2016-09-22: 1 via INTRADERMAL

## 2016-09-22 MED ORDER — SODIUM CHLORIDE 0.9 % IV SOLN
INTRAVENOUS | Status: DC | PRN
  Administered 2016-09-22: 500 mL

## 2016-09-22 MED ORDER — PROPOFOL 10 MG/ML IV BOLUS
INTRAVENOUS | Status: DC | PRN
  Administered 2016-09-22: 200 mg via INTRAVENOUS

## 2016-09-22 MED ORDER — GABAPENTIN 300 MG PO CAPS
300.0000 mg | ORAL_CAPSULE | ORAL | Status: AC
  Administered 2016-09-22: 300 mg via ORAL
  Filled 2016-09-22: qty 1

## 2016-09-22 MED ORDER — BUPIVACAINE-EPINEPHRINE (PF) 0.5% -1:200000 IJ SOLN
INTRAMUSCULAR | Status: DC | PRN
  Administered 2016-09-22: 30 mL via PERINEURAL

## 2016-09-22 MED ORDER — ONDANSETRON HCL 4 MG/2ML IJ SOLN
INTRAMUSCULAR | Status: DC | PRN
Start: 2016-09-22 — End: 2016-09-22
  Administered 2016-09-22: 4 mg via INTRAVENOUS

## 2016-09-22 MED ORDER — ONDANSETRON HCL 4 MG/2ML IJ SOLN: INTRAMUSCULAR | Status: DC | PRN

## 2016-09-22 MED ORDER — FENTANYL CITRATE (PF) 100 MCG/2ML IJ SOLN
INTRAMUSCULAR | Status: AC
  Filled 2016-09-22: qty 4

## 2016-09-22 MED ORDER — DEXAMETHASONE SODIUM PHOSPHATE 10 MG/ML IJ SOLN
INTRAMUSCULAR | Status: DC | PRN
  Administered 2016-09-22: 4 mg via INTRAVENOUS

## 2016-09-22 MED ORDER — FENTANYL CITRATE (PF) 100 MCG/2ML IJ SOLN
INTRAMUSCULAR | Status: AC
  Filled 2016-09-22: qty 2

## 2016-09-22 SURGICAL SUPPLY — 65 items
BAG DECANTER FOR FLEXI CONT (MISCELLANEOUS) ×3 IMPLANT
BENZOIN TINCTURE PRP APPL 2/3 (GAUZE/BANDAGES/DRESSINGS) ×3 IMPLANT
BINDER BREAST LRG (GAUZE/BANDAGES/DRESSINGS) IMPLANT
BINDER BREAST XLRG (GAUZE/BANDAGES/DRESSINGS) IMPLANT
BINDER BREAST XXLRG (GAUZE/BANDAGES/DRESSINGS) ×3 IMPLANT
BLADE SURG 15 STRL LF DISP TIS (BLADE) ×2 IMPLANT
BLADE SURG 15 STRL SS (BLADE) ×1
CANISTER SUCT 3000ML PPV (MISCELLANEOUS) ×3 IMPLANT
CHLORAPREP W/TINT 10.5 ML (MISCELLANEOUS) ×3 IMPLANT
CHLORAPREP W/TINT 26ML (MISCELLANEOUS) ×3 IMPLANT
CLIP TI WIDE RED SMALL 6 (CLIP) ×3 IMPLANT
CONT SPEC 4OZ CLIKSEAL STRL BL (MISCELLANEOUS) ×9 IMPLANT
COVER PROBE W GEL 5X96 (DRAPES) ×3 IMPLANT
COVER SURGICAL LIGHT HANDLE (MISCELLANEOUS) ×3 IMPLANT
COVER TRANSDUCER ULTRASND GEL (DRAPE) ×3 IMPLANT
CRADLE DONUT ADULT HEAD (MISCELLANEOUS) ×3 IMPLANT
DERMABOND ADVANCED (GAUZE/BANDAGES/DRESSINGS) ×2
DERMABOND ADVANCED .7 DNX12 (GAUZE/BANDAGES/DRESSINGS) ×4 IMPLANT
DEVICE DUBIN SPECIMEN MAMMOGRA (MISCELLANEOUS) ×3 IMPLANT
DRAPE C-ARM 42X72 X-RAY (DRAPES) ×3 IMPLANT
DRAPE CHEST BREAST 15X10 FENES (DRAPES) ×3 IMPLANT
DRAPE UTILITY XL STRL (DRAPES) ×6 IMPLANT
ELECT CAUTERY BLADE 6.4 (BLADE) ×3 IMPLANT
ELECT COATED BLADE 2.86 ST (ELECTRODE) ×3 IMPLANT
ELECT REM PT RETURN 9FT ADLT (ELECTROSURGICAL) ×3
ELECTRODE REM PT RTRN 9FT ADLT (ELECTROSURGICAL) ×2 IMPLANT
GAUZE SPONGE 4X4 12PLY STRL (GAUZE/BANDAGES/DRESSINGS) ×3 IMPLANT
GAUZE SPONGE 4X4 12PLY STRL LF (GAUZE/BANDAGES/DRESSINGS) ×6 IMPLANT
GAUZE SPONGE 4X4 16PLY XRAY LF (GAUZE/BANDAGES/DRESSINGS) ×3 IMPLANT
GEL ULTRASOUND 20GR AQUASONIC (MISCELLANEOUS) ×3 IMPLANT
GLOVE SURG SIGNA 7.5 PF LTX (GLOVE) ×9 IMPLANT
GOWN STRL REUS W/ TWL LRG LVL3 (GOWN DISPOSABLE) ×2 IMPLANT
GOWN STRL REUS W/ TWL XL LVL3 (GOWN DISPOSABLE) ×2 IMPLANT
GOWN STRL REUS W/TWL LRG LVL3 (GOWN DISPOSABLE) ×1
GOWN STRL REUS W/TWL XL LVL3 (GOWN DISPOSABLE) ×1
INTRODUCER 13FR (MISCELLANEOUS) IMPLANT
KIT BASIN OR (CUSTOM PROCEDURE TRAY) ×3 IMPLANT
KIT MARKER MARGIN INK (KITS) ×3 IMPLANT
KIT PORT POWER 8FR ISP CVUE (Catheter) ×3 IMPLANT
KIT ROOM TURNOVER OR (KITS) ×3 IMPLANT
NDL SAFETY ECLIPSE 18X1.5 (NEEDLE) IMPLANT
NEEDLE FILTER BLUNT 18X 1/2SAF (NEEDLE)
NEEDLE FILTER BLUNT 18X1 1/2 (NEEDLE) IMPLANT
NEEDLE HYPO 18GX1.5 SHARP (NEEDLE)
NEEDLE HYPO 25GX1X1/2 BEV (NEEDLE) ×3 IMPLANT
NEEDLE HYPO 25X1 1.5 SAFETY (NEEDLE) ×3 IMPLANT
NS IRRIG 1000ML POUR BTL (IV SOLUTION) ×3 IMPLANT
PACK SURGICAL SETUP 50X90 (CUSTOM PROCEDURE TRAY) ×3 IMPLANT
PAD ARMBOARD 7.5X6 YLW CONV (MISCELLANEOUS) ×3 IMPLANT
PENCIL BUTTON HOLSTER BLD 10FT (ELECTRODE) ×3 IMPLANT
SET SHEATH INTRODUCER 10FR (MISCELLANEOUS) IMPLANT
SHEATH COOK PEEL AWAY SET 9F (SHEATH) IMPLANT
SPONGE LAP 18X18 X RAY DECT (DISPOSABLE) ×6 IMPLANT
STRIP CLOSURE SKIN 1/4X4 (GAUZE/BANDAGES/DRESSINGS) ×3 IMPLANT
SUT MNCRL AB 4-0 PS2 18 (SUTURE) ×3 IMPLANT
SUT VIC AB 3-0 SH 18 (SUTURE) ×6 IMPLANT
SUT VIC AB 3-0 SH 8-18 (SUTURE) ×9 IMPLANT
SYR 20ML ECCENTRIC (SYRINGE) ×6 IMPLANT
SYR 5ML LUER SLIP (SYRINGE) ×3 IMPLANT
SYR BULB 3OZ (MISCELLANEOUS) ×3 IMPLANT
SYR CONTROL 10ML LL (SYRINGE) ×3 IMPLANT
TOWEL OR 17X24 6PK STRL BLUE (TOWEL DISPOSABLE) ×3 IMPLANT
TOWEL OR 17X26 10 PK STRL BLUE (TOWEL DISPOSABLE) ×3 IMPLANT
TUBE CONNECTING 12X1/4 (SUCTIONS) ×3 IMPLANT
YANKAUER SUCT BULB TIP NO VENT (SUCTIONS) ×3 IMPLANT

## 2016-09-22 NOTE — Anesthesia Preprocedure Evaluation (Addendum)
Anesthesia Evaluation  Patient identified by MRN, date of birth, ID band Patient awake    Reviewed: Allergy & Precautions, NPO status , Patient's Chart, lab work & pertinent test results  History of Anesthesia Complications Negative for: history of anesthetic complications  Airway Mallampati: II  TM Distance: >3 FB Neck ROM: full  Mouth opening: Limited Mouth Opening  Dental  (+) Teeth Intact, Dental Advidsory Given   Pulmonary shortness of breath and with exertion, asthma , former smoker,    breath sounds clear to auscultation       Cardiovascular negative cardio ROS   Rhythm:regular     Neuro/Psych PSYCHIATRIC DISORDERS Depression negative neurological ROS     GI/Hepatic GERD  Medicated and Controlled,  Endo/Other  diabetesMorbid obesity  Renal/GU      Musculoskeletal  (+) Arthritis ,   Abdominal   Peds  Hematology   Anesthesia Other Findings   Reproductive/Obstetrics                            Anesthesia Physical Anesthesia Plan  ASA: III  Anesthesia Plan: General and Regional   Post-op Pain Management:    Induction: Intravenous  Airway Management Planned: Oral ETT  Additional Equipment: None  Intra-op Plan:   Post-operative Plan: Extubation in OR  Informed Consent: I have reviewed the patients History and Physical, chart, labs and discussed the procedure including the risks, benefits and alternatives for the proposed anesthesia with the patient or authorized representative who has indicated his/her understanding and acceptance.   Dental Advisory Given  Plan Discussed with: Surgeon and CRNA  Anesthesia Plan Comments:        Anesthesia Quick Evaluation

## 2016-09-22 NOTE — Anesthesia Postprocedure Evaluation (Signed)
Anesthesia Post Note  Patient: Sandra Wood  Procedure(s) Performed: Procedure(s) (LRB): BREAST LUMPECTOMY WITH RADIOACTIVE SEED AND LEFT AXILLARY SENTINEL LYMPH NODE BIOPSY (Left) INSERTION PORT-A-CATH (N/A)  Patient location during evaluation: PACU Anesthesia Type: Regional and General Level of consciousness: awake and alert Pain management: pain level controlled Vital Signs Assessment: post-procedure vital signs reviewed and stable Respiratory status: spontaneous breathing, nonlabored ventilation and respiratory function stable Cardiovascular status: blood pressure returned to baseline and stable Postop Assessment: no signs of nausea or vomiting Anesthetic complications: no       Last Vitals:  Vitals:   09/22/16 1845 09/22/16 1900  BP:    Pulse: 70 73  Resp: 15 15  Temp:      Last Pain:  Vitals:   09/22/16 1815  TempSrc:   PainSc: 0-No pain                 [name],W. EDMOND

## 2016-09-22 NOTE — Interval H&P Note (Signed)
History and Physical Interval Note:  09/22/2016 2:14 PM  Sandra Wood  has presented today for surgery, with the diagnosis of LEFT BREAST CANCER  The various methods of treatment have been discussed with the patient and family.  Daughter and mother at bedside.  The seed is in good location.  After consideration of risks, benefits and other options for treatment, the patient has consented to  Procedure(s): BREAST LUMPECTOMY WITH RADIOACTIVE SEED AND LEFT AXILLARY SENTINEL LYMPH NODE BIOPSY (Left) INSERTION PORT-A-CATH (N/A) as a surgical intervention .  The patient's history has been reviewed, patient examined, no change in status, stable for surgery.  I have reviewed the patient's chart and labs.  Questions were answered to the patient's satisfaction.     Eppie Barhorst H

## 2016-09-22 NOTE — Anesthesia Procedure Notes (Signed)
Anesthesia Regional Block: Pectoralis block   Pre-Anesthetic Checklist: ,, timeout performed, Correct Patient, Correct Site, Correct Laterality, Correct Procedure, Correct Position, risks and benefits discussed, surgical consent, pre-op evaluation,  At surgeon's request and post-op pain management  Laterality: Left  Prep: chloraprep       Needles:  Injection technique: Single-shot  Needle Type: Echogenic Needle          Additional Needles:   Procedures: ultrasound guided,,,,,,,,  Narrative:  Start time: 09/22/2016 2:26 PM End time: 09/22/2016 2:33 PM Injection made incrementally with aspirations every 5 mL.  Performed by: Personally   Additional Notes: H+P and labs reviewed, risks and benefits discussed with patient, procedure tolerated well without complications

## 2016-09-22 NOTE — Op Note (Signed)
09/22/2016  5:24 PM  PATIENT:  Sandra Wood DOB: 06/14/1956 MRN: [id number]  PREOP DIAGNOSIS:   LEFT BREAST CANCER  POSTOP DIAGNOSIS:    Left breast cancer, subareolar position (T1, N0)  PROCEDURE:   Procedure(s): Left BREAST LUMPECTOMY WITH RADIOACTIVE SEED AND LEFT AXILLARY SENTINEL LYMPH NODE BIOPSY, INSERTION right internal jugular PORT-A-CATH (with use of ultrasound), deep left axillary sentinel lymph node biopsy  SURGEON:   Alphonsa Overall, M.D.  ANESTHESIA:   general  Anesthesiologist: Belinda Block, MD; Montez Hageman, MD; Rica Koyanagi, MD CRNA: Wilburn Cornelia, CRNA; Inda Coke, CRNA  General  EBL:  <75  ml  DRAINS:  none   LOCAL MEDICATIONS USED:   20 cc of 1/4% marcaine, left pectoral block  SPECIMEN:   Left breast lumpectomy (6 color), inferior margin (2 color with suture anterior), medial margin (2 color with suture anterior), left axillary lymph node (counts 400, background 5)  COUNTS CORRECT:  YES  INDICATIONS FOR PROCEDURE:  Sandra Wood is a 61 y.o. (DOB: 07/01/1955) white female whose primary care physician is Scarlette Calico, MD and comes for left breast lumpectomy and left axillary sentinel lymph node biopsy.   She presented to the breast multidisciplinary clinic with Drs Lindi Adie and Sondra Come.  Her tumor is Her2Neu positive, therefore there is a plan to give her chemotx.   The options for breast cancer treatment have been discussed with the patient. She elected to proceed with lumpectomy and axillary sentinel lymph node.  I also talked to her about a power port placement.    The indications and potential complications of surgery were explained to the patient. Potential complications include, but are not limited to, bleeding, infection, the need for further surgery, and nerve injury.     She had a I131 seed placed on 09/18/2016 in her left breast in the subareolar location.  I confirmed the presence of the I131 seed in the pre op area using the Neoprobe.   The seed is in the subareoalr position of the left breast.   In the holding area, her left areola was injected with 1 millicurie of Technitium Sulfur Colloid.  OPERATIVE NOTE:   The patient was taken to room # 1 at [place] where she underwent a general anesthesia  supervised by Anesthesiologist: Belinda Block, MD; Montez Hageman, MD; Rica Koyanagi, MD CRNA: Wilburn Cornelia, CRNA; Inda Coke, CRNA. Her left breast and axilla were prepped with  ChloraPrep and sterilely draped.    A time-out and the surgical check list was reviewed.    I turned attention to the cancer which was in the subareolar position of the left breast.   I used the Neoprobe to identify the I131 seed.  I tried to excise an area around the tumor of at least 1 cm.    I excised this block of breast tissue approximately 4 cm by 4 cm  in diameter.   I painted the lumpectomy specimen with the 6 color paint kit and did a specimen mammogram which confirmed the mass, clip, and the seed were all in the right position in the specimen.  The specimen was sent to pathology who called back to confirm that they have the seed and the specimen.   She did have some fibrocystic disease at the inferior margin of the lumpectomy.  So, I also excised the inferior margin and the medial margin and painted both of the margins with two colors.  A suture was placed anteriorly.  I then started the left deep axillary sentinel lymph node biopsy. I made an incision in the left axilla.  I found a hot area at the junction of the breast and the pectoralis major muscle, deep in the axilla. I cut down and  identified a hot node that had counts of 400 and the background has 5 counts.  I checked her internal mammary nodes and supraclavicular nodes with the neoprobe and found no other hot area. The axillary node was then sent to pathology.    I then irrigated the wound with saline. I infiltrated approximately 13 mL of 1/4% Marcaine between the incisions. I placed 4  clips to mark biopsy cavity, at 12, 3, 6, and 9 o'clock.  I then closed all the wounds in layers using 3-0 Vicryl sutures for the deep layer. At the skin, I closed the incisions with a 4-0 Monocryl suture.    I then repositioned the patient for the power port placement.  Both her arms were tucked and a roll placed under back.  Another time out was held and the surgery checklist reviewed.   The patient was placed in Trendelenburg position.  The right internal jugular was identified with ultrasound and accessed with a 16 gauge needle and a guide wire threaded through the needle into the right internal jugular vein, down towards the heart.  The position of the wire was checked with fluoroscopy.   I then developed a pocket in the upper inner aspect of the right chest for the port reservoir.  I used the Bard Power Port kit for venous access.  The reservoir was sewn in place with a 3-0 Vicryl suture.  The reservoir had been flushed with dilute (10 units/cc) heparin.   I then passed the silastic tubing from the reservoir incision to a subclavian incision and then on to the right internal jugular vein.  I used the 8 French introducer to pass it into the vein.  The tip of the silastic catheter was position at the junction of the SVC and the right atrium under fluoroscopy.  The silastic catheter was then attached to the port with the bayonet device.     The entire port and tubing were checked with fluoroscopy and then the port was flushed with 4 cc of concentrated heparin (100 units/cc).   The wounds were then closed with 3-0 vicryl subcutaneous sutures and the skin closed with a 4-0 Monocryl suture.  The incisions were painted with DermaBond.    She had gauze place over the wounds and placed in a breast binder.   The patient tolerated the procedure well, was transported to the recovery room in good condition. Sponge and needle count were correct at the end of the case.   Final pathology is pending.  A CXR for  port placement is ordered.   Sandra Newman, MD, FACS [facility] Pager: [phone] Office phone:  [phone]      

## 2016-09-22 NOTE — Progress Notes (Signed)
Care of pt assumed by MA Lindee Leason RN 

## 2016-09-22 NOTE — Anesthesia Procedure Notes (Signed)
Procedure Name: Intubation Date/Time: 09/22/2016 2:40 PM Performed by: Merrilyn Puma B Pre-anesthesia Checklist: Patient identified, Emergency Drugs available, Suction available, Patient being monitored and Timeout performed Patient Re-evaluated:Patient Re-evaluated prior to inductionOxygen Delivery Method: Circle system utilized Preoxygenation: Pre-oxygenation with 100% oxygen Intubation Type: IV induction, Rapid sequence and Cricoid Pressure applied Laryngoscope Size: Mac and 3 Grade View: Grade III Tube type: Oral Tube size: 7.0 mm Number of attempts: 1 Airway Equipment and Method: Stylet Placement Confirmation: ETT inserted through vocal cords under direct vision,  positive ETCO2,  CO2 detector and breath sounds checked- equal and bilateral Secured at: 21 cm Tube secured with: Tape Dental Injury: Teeth and Oropharynx as per pre-operative assessment

## 2016-09-22 NOTE — Transfer of Care (Signed)
Immediate Anesthesia Transfer of Care Note  Patient: Sandra Wood  Procedure(s) Performed: Procedure(s): BREAST LUMPECTOMY WITH RADIOACTIVE SEED AND LEFT AXILLARY SENTINEL LYMPH NODE BIOPSY (Left) INSERTION PORT-A-CATH (N/A)  Patient Location: PACU  Anesthesia Type:GA combined with regional for post-op pain  Level of Consciousness: awake  Airway & Oxygen Therapy: Patient Spontanous Breathing and Patient connected to face mask oxygen  Post-op Assessment: Report given to RN, Post -op Vital signs reviewed and stable and Patient moving all extremities X 4  Post vital signs: Reviewed and stable  Last Vitals:  Vitals:   09/22/16 1219  BP: (!) 168/78  Resp: 18  Temp: 37.5 C    Last Pain:  Vitals:   09/22/16 1219  TempSrc: Oral      Patients Stated Pain Goal: 2 (21/22/48 2500)  Complications: No apparent anesthesia complications

## 2016-09-23 ENCOUNTER — Encounter (HOSPITAL_COMMUNITY): Payer: Self-pay | Admitting: Surgery

## 2016-10-05 ENCOUNTER — Telehealth: Payer: Self-pay | Admitting: Hematology and Oncology

## 2016-10-05 ENCOUNTER — Encounter: Payer: Self-pay | Admitting: Hematology and Oncology

## 2016-10-05 ENCOUNTER — Ambulatory Visit (HOSPITAL_BASED_OUTPATIENT_CLINIC_OR_DEPARTMENT_OTHER): Payer: No Typology Code available for payment source | Admitting: Hematology and Oncology

## 2016-10-05 ENCOUNTER — Other Ambulatory Visit: Payer: Self-pay | Admitting: Hematology and Oncology

## 2016-10-05 DIAGNOSIS — Z170 Estrogen receptor positive status [ER+]: Principal | ICD-10-CM

## 2016-10-05 DIAGNOSIS — C50312 Malignant neoplasm of lower-inner quadrant of left female breast: Secondary | ICD-10-CM

## 2016-10-05 MED ORDER — ONDANSETRON HCL 8 MG PO TABS: 8.0000 mg | ORAL_TABLET | Freq: Two times a day (BID) | ORAL | 1 refills | Status: DC | PRN

## 2016-10-05 MED ORDER — LIDOCAINE-PRILOCAINE 2.5-2.5 % EX CREA: TOPICAL_CREAM | CUTANEOUS | 3 refills | Status: DC

## 2016-10-05 MED ORDER — PROCHLORPERAZINE MALEATE 10 MG PO TABS: 10.0000 mg | ORAL_TABLET | Freq: Four times a day (QID) | ORAL | 1 refills | Status: DC | PRN

## 2016-10-05 MED ORDER — LORAZEPAM 0.5 MG PO TABS: 0.5000 mg | ORAL_TABLET | Freq: Four times a day (QID) | ORAL | 0 refills | Status: DC | PRN

## 2016-10-05 NOTE — Progress Notes (Signed)
START ON PATHWAY REGIMEN - Breast   Paclitaxel Weekly + Trastuzumab Weekly:   Administer weekly:     Paclitaxel      Trastuzumab      Trastuzumab   **Always confirm dose/schedule in your pharmacy ordering system**    Trastuzumab (Maintenance - NO Loading Dose):   A cycle is every 21 days:     Trastuzumab   **Always confirm dose/schedule in your pharmacy ordering system**    Patient Characteristics: Postoperative without Neoadjuvant Therapy (Pathologic Staging), Invasive Disease, Adjuvant Therapy, Node Negative, HER2 Positive, ER Positive, Stage Ia and Ib, T1c Therapeutic Status: Postoperative without Neoadjuvant Therapy (Pathologic Staging) AJCC M Category: pM0 AJCC N Category: pN0 AJCC 8 Stage Grouping: IA ER Status: Positive (+) HER2 Status: Positive (+) Oncotype Dx Recurrence Score: Not Appropriate AJCC T Category: T1c AJCC Grade: G3 PR Status: Positive (+)  Intent of Therapy: Curative Intent, Discussed with Patient

## 2016-10-05 NOTE — Assessment & Plan Note (Signed)
Left breast biopsy 08/18/2016: 8:00 retroareolar position: IDC, high-grade with DCIS high-grade, ER 90%, PR 95%, Ki-67 20%, HER-2 positive ratio 3.4; screening detected left breast distortion LIQ 8 8:00 retroareolar 0.8 cm, axilla negative, T1b N0 stage IA clinical stage Left lumpectomy 09/22/2016: IDC grade 3, 1.3 cm, DCIS high-grade, margins clear, 0/1 lymph node negative, T1 CN 0 stage IA, ER 90%, PR 95%, Ki-67 20%, HER-2 positive ratio 3.4  Pathology counseling: I discussed the final pathology report of the patient provided  a copy of this report. I discussed the margins as well as lymph node surgeries. We also discussed the final staging along with previously performed ER/PR and HER-2/neu testing.  Recommendation: 1. Adjuvant chemotherapy and Herceptin with weekly Taxol and Herceptin 12 followed by Herceptin maintenance for 1 year 3. Followed by adjuvant radiation 4. Followed by adjuvant antiestrogen therapy with letrozole 2.5 mg daily 5 years  Chemotherapy counseling: I discussed this and benefits of chemotherapy including the risks and heart as well as neutropenia and neuropathy. Patient understands his risks.  Return to clinic in 2 weeks to initiate adjuvant chemotherapy.

## 2016-10-05 NOTE — Progress Notes (Signed)
Patient Care Team: Janith Lima, MD as PCP - General (Internal Medicine) Alphonsa Overall, MD as Consulting Physician (General Surgery) Nicholas Lose, MD as Consulting Physician (Hematology and Oncology) Eppie Gibson, MD as Attending Physician (Radiation Oncology)  DIAGNOSIS:  Encounter Diagnosis  Name Primary?  . Malignant neoplasm of lower-inner quadrant of left breast in female, estrogen receptor positive ([place])     SUMMARY OF ONCOLOGIC HISTORY:   Malignant neoplasm of lower-inner quadrant of left breast in female, estrogen receptor positive ([place])   08/18/2016 Initial Diagnosis    Left breast biopsy 8:00 retroareolar position: IDC, high-grade with DCIS high-grade, ER 90%, PR 95%, Ki-67 20%, HER-2 positive ratio 3.4; screening detected left breast distortion LIQ 8 8:00 retroareolar 0.8 cm, axilla negative, T1b N0 stage IA clinical stage      09/22/2016 Surgery    Left lumpectomy: IDC grade 3, 1.3 cm, DCIS high-grade, margins clear, 0/1 lymph node negative, T1CN 0 stage IA, ER 90%, PR 95%, Ki-67 20%, HER-2 positive ratio 3.4       CHIEF COMPLIANT: Follow-up after left lumpectomy  INTERVAL HISTORY: Sandra Wood is a 61 year old with above-mentioned history left breast cancer treated with left lumpectomy. She is here to discuss the pathology report. She done extremely well from the surgery. Does have mild to moderate discomfort. She also underwent a port placement. She is here to discuss the pathology report as well as to come up with the date for starting chemotherapy.  REVIEW OF SYSTEMS:   Constitutional: Denies fevers, chills or abnormal weight loss Eyes: Denies blurriness of vision Ears, nose, mouth, throat, and face: Denies mucositis or sore throat Respiratory: Denies cough, dyspnea or wheezes Cardiovascular: Denies palpitation, chest discomfort Gastrointestinal:  Denies nausea, heartburn or change in bowel habits Skin: Denies abnormal skin rashes Lymphatics: Denies new  lymphadenopathy or easy bruising Neurological:Denies numbness, tingling or new weaknesses Behavioral/Psych: Mood is stable, no new changes  Extremities: No lower extremity edema Breast:  Left lumpectomy All other systems were reviewed with the patient and are negative.  I have reviewed the past medical history, past surgical history, social history and family history with the patient and they are unchanged from previous note.  ALLERGIES:  is allergic to no known allergies.  MEDICATIONS:  Current Outpatient Prescriptions  Medication Sig Dispense Refill  . HYDROcodone-acetaminophen (NORCO/VICODIN) 5-325 MG tablet Take 1-2 tablets by mouth every 6 (six) hours as needed. 20 tablet 0  . ibuprofen (ADVIL,MOTRIN) 200 MG tablet Take 400-600 mg by mouth every 6 (six) hours as needed for headache or mild pain (depends on pain if takes 2-3 tablets).     Marland Kitchen ipratropium-albuterol (DUONEB) 0.5-2.5 (3) MG/3ML SOLN Inhale 3 mLs into the lungs every 4 (four) hours as needed (shortness of breath).     Marland Kitchen PROAIR HFA 108 (90 Base) MCG/ACT inhaler Inhale 2 puffs into the lungs every 6 (six) hours as needed for wheezing or shortness of breath.     . sertraline (ZOLOFT) 50 MG tablet Take 1 tablet (50 mg total) by mouth daily. (Patient taking differently: Take 50 mg by mouth daily as needed. ) 90 tablet 3   No current facility-administered medications for this visit.     PHYSICAL EXAMINATION: ECOG PERFORMANCE STATUS: 1 - Symptomatic but completely ambulatory  Vitals:   10/05/16 0947  BP: (!) 151/70  Pulse: 93  Resp: 18  Temp: 98.5 F (36.9 C)   Filed Weights   10/05/16 0947  Weight: (!) 334 lb 8 oz (151.7 kg)  GENERAL:alert, no distress and comfortable SKIN: skin color, texture, turgor are normal, no rashes or significant lesions EYES: normal, Conjunctiva are pink and non-injected, sclera clear OROPHARYNX:no exudate, no erythema and lips, buccal mucosa, and tongue normal  NECK: supple, thyroid  normal size, non-tender, without nodularity LYMPH:  no palpable lymphadenopathy in the cervical, axillary or inguinal LUNGS: clear to auscultation and percussion with normal breathing effort HEART: regular rate & rhythm and no murmurs and no lower extremity edema ABDOMEN:abdomen soft, non-tender and normal bowel sounds MUSCULOSKELETAL:no cyanosis of digits and no clubbing  NEURO: alert & oriented x 3 with fluent speech, no focal motor/sensory deficits EXTREMITIES: No lower extremity edema  LABORATORY DATA:  I have reviewed the data as listed   Chemistry      Component Value Date/Time   NA 137 09/15/2016 1255   NA 142 08/26/2016 0810   K 3.7 09/15/2016 1255   K 4.0 08/26/2016 0810   CL 103 09/15/2016 1255   CO2 23 09/15/2016 1255   CO2 28 08/26/2016 0810   BUN 8 09/15/2016 1255   BUN 14.7 08/26/2016 0810   CREATININE 0.80 09/15/2016 1255   CREATININE 0.8 08/26/2016 0810      Component Value Date/Time   CALCIUM 9.0 09/15/2016 1255   CALCIUM 9.7 08/26/2016 0810   ALKPHOS 128 08/26/2016 0810   AST 9 08/26/2016 0810   ALT 12 08/26/2016 0810   BILITOT 0.64 08/26/2016 0810       Lab Results  Component Value Date   WBC 14.2 (H) 09/15/2016   HGB 14.4 09/15/2016   HCT 45.2 09/15/2016   MCV 85.6 09/15/2016   PLT 304 09/15/2016   NEUTROABS 6.9 (H) 08/26/2016    ASSESSMENT & PLAN:  Malignant neoplasm of lower-inner quadrant of left breast in female, estrogen receptor positive ([facility]) Left breast biopsy 08/18/2016: 8:00 retroareolar position: IDC, high-grade with DCIS high-grade, ER 90%, PR 95%, Ki-67 20%, HER-2 positive ratio 3.4; screening detected left breast distortion LIQ 8 8:00 retroareolar 0.8 cm, axilla negative, T1b N0 stage IA clinical stage Left lumpectomy 09/22/2016: IDC grade 3, 1.3 cm, DCIS high-grade, margins clear, 0/1 lymph node negative, T1 CN 0 stage IA, ER 90%, PR 95%, Ki-67 20%, HER-2 positive ratio 3.4  Pathology counseling: I discussed the final pathology  report of the patient provided  a copy of this report. I discussed the margins as well as lymph node surgeries. We also discussed the final staging along with previously performed ER/PR and HER-2/neu testing.  Recommendation: 1. Adjuvant chemotherapy and Herceptin with weekly Taxol and Herceptin 12 followed by Herceptin maintenance for 1 year 3. Followed by adjuvant radiation 4. Followed by adjuvant antiestrogen therapy with letrozole 2.5 mg daily 5 years  Chemotherapy counseling: I discussed this and benefits of chemotherapy including the risks and heart as well as neutropenia and neuropathy. Patient understands the risks.  Return to clinicto initiate adjuvant chemotherapy on 10/19/2016.   I spent 25 minutes talking to the patient of which more than half was spent in counseling and coordination of care.  No orders of the defined types were placed in this encounter.  The patient has a good understanding of the overall plan. she agrees with it. she will call with any problems that may develop before the next visit here.   Rulon Eisenmenger, MD 10/05/16

## 2016-10-05 NOTE — Telephone Encounter (Signed)
Gave patient avs report and appointments for April thru July

## 2016-10-14 ENCOUNTER — Other Ambulatory Visit: Payer: Self-pay | Admitting: Hematology and Oncology

## 2016-10-14 DIAGNOSIS — C50312 Malignant neoplasm of lower-inner quadrant of left female breast: Secondary | ICD-10-CM

## 2016-10-14 DIAGNOSIS — Z170 Estrogen receptor positive status [ER+]: Principal | ICD-10-CM

## 2016-10-14 MED ORDER — LIDOCAINE-PRILOCAINE 2.5-2.5 % EX CREA: TOPICAL_CREAM | CUTANEOUS | 3 refills | Status: DC

## 2016-10-18 NOTE — Assessment & Plan Note (Signed)
Left breast biopsy02/20/2018:8:00 retroareolar position: IDC, high-grade with DCIS high-grade, ER 90%, PR 95%, Ki-67 20%, HER-2 positive ratio 3.4; screening detected left breast distortion LIQ 8 8:00 retroareolar 0.8 cm, axilla negative, T1b N0 stage IA clinical stage Left lumpectomy 09/22/2016: IDC grade 3, 1.3 cm, DCIS high-grade, margins clear, 0/1 lymph node negative, T1 CN 0 stage IA, ER 90%, PR 95%, Ki-67 20%, HER-2 positive ratio 3.4  Pathology counseling: I discussed the final pathology report of the patient provided  a copy of this report. I discussed the margins as well as lymph node surgeries. We also discussed the final staging along with previously performed ER/PR and HER-2/neu testing.  Recommendation: 1. Adjuvant chemotherapy and Herceptin with weekly Taxol and Herceptin 12 followed by Herceptin maintenance for 1 year 3. Followed by adjuvant radiation 4. Followed by adjuvant antiestrogen therapy with letrozole 2.5 mg daily 5 years ----------------------------------------------------------------------------- Current Treatment: Taxol Herceptin Cycle 1 Chemo education completed Consent obtained Labs reviewed Anti emetics reviewed  RTC in 1 week for tox check and for cycle 2.

## 2016-10-19 ENCOUNTER — Ambulatory Visit: Payer: No Typology Code available for payment source

## 2016-10-19 ENCOUNTER — Encounter: Payer: Self-pay | Admitting: Hematology and Oncology

## 2016-10-19 ENCOUNTER — Other Ambulatory Visit (HOSPITAL_BASED_OUTPATIENT_CLINIC_OR_DEPARTMENT_OTHER): Payer: No Typology Code available for payment source

## 2016-10-19 ENCOUNTER — Ambulatory Visit (HOSPITAL_BASED_OUTPATIENT_CLINIC_OR_DEPARTMENT_OTHER): Payer: No Typology Code available for payment source

## 2016-10-19 ENCOUNTER — Encounter: Payer: Self-pay | Admitting: *Deleted

## 2016-10-19 ENCOUNTER — Ambulatory Visit (HOSPITAL_BASED_OUTPATIENT_CLINIC_OR_DEPARTMENT_OTHER): Payer: No Typology Code available for payment source | Admitting: Hematology and Oncology

## 2016-10-19 VITALS — BP 100/64 | HR 91 | Temp 98.5°F | Resp 24

## 2016-10-19 DIAGNOSIS — Z170 Estrogen receptor positive status [ER+]: Secondary | ICD-10-CM | POA: Diagnosis not present

## 2016-10-19 DIAGNOSIS — C50312 Malignant neoplasm of lower-inner quadrant of left female breast: Secondary | ICD-10-CM

## 2016-10-19 DIAGNOSIS — Z5111 Encounter for antineoplastic chemotherapy: Secondary | ICD-10-CM

## 2016-10-19 DIAGNOSIS — Z5112 Encounter for antineoplastic immunotherapy: Secondary | ICD-10-CM | POA: Diagnosis not present

## 2016-10-19 LAB — CBC WITH DIFFERENTIAL/PLATELET
BASO%: 0.9 % (ref 0.0–2.0)
Basophils Absolute: 0.1 10*3/uL (ref 0.0–0.1)
EOS%: 2.3 % (ref 0.0–7.0)
Eosinophils Absolute: 0.3 10*3/uL (ref 0.0–0.5)
HCT: 42.4 % (ref 34.8–46.6)
HGB: 13.9 g/dL (ref 11.6–15.9)
LYMPH%: 17 % (ref 14.0–49.7)
MCH: 27.2 pg (ref 25.1–34.0)
MCHC: 32.8 g/dL (ref 31.5–36.0)
MCV: 83 fL (ref 79.5–101.0)
MONO#: 1 10*3/uL — ABNORMAL HIGH (ref 0.1–0.9)
MONO%: 7.5 % (ref 0.0–14.0)
NEUT#: 9.4 10*3/uL — ABNORMAL HIGH (ref 1.5–6.5)
NEUT%: 72.3 % (ref 38.4–76.8)
Platelets: 287 10*3/uL (ref 145–400)
RBC: 5.11 10*6/uL (ref 3.70–5.45)
RDW: 15.5 % — ABNORMAL HIGH (ref 11.2–14.5)
WBC: 13 10*3/uL — ABNORMAL HIGH (ref 3.9–10.3)
lymph#: 2.2 10*3/uL (ref 0.9–3.3)

## 2016-10-19 LAB — COMPREHENSIVE METABOLIC PANEL
ALBUMIN: 3.1 g/dL — AB (ref 3.5–5.0)
ALK PHOS: 148 U/L (ref 40–150)
ALT: 19 U/L (ref 0–55)
AST: 13 U/L (ref 5–34)
Anion Gap: 12 mEq/L — ABNORMAL HIGH (ref 3–11)
BUN: 14.7 mg/dL (ref 7.0–26.0)
CALCIUM: 9 mg/dL (ref 8.4–10.4)
CO2: 23 mEq/L (ref 22–29)
CREATININE: 0.9 mg/dL (ref 0.6–1.1)
Chloride: 105 mEq/L (ref 98–109)
EGFR: 67 mL/min/{1.73_m2} — ABNORMAL LOW (ref >90)
GLUCOSE: 232 mg/dL — AB (ref 70–140)
POTASSIUM: 3.9 meq/L (ref 3.5–5.1)
SODIUM: 140 meq/L (ref 136–145)
Total Bilirubin: 0.44 mg/dL (ref 0.20–1.20)
Total Protein: 7.2 g/dL (ref 6.4–8.3)

## 2016-10-19 MED ORDER — FAMOTIDINE IN NACL 20-0.9 MG/50ML-% IV SOLN
INTRAVENOUS | Status: AC
  Filled 2016-10-19: qty 50

## 2016-10-19 MED ORDER — SODIUM CHLORIDE 0.9 % IV SOLN: 10.0000 mg | Freq: Once | INTRAVENOUS | Status: DC

## 2016-10-19 MED ORDER — FAMOTIDINE IN NACL 20-0.9 MG/50ML-% IV SOLN
20.0000 mg | Freq: Once | INTRAVENOUS | Status: AC
  Administered 2016-10-19: 20 mg via INTRAVENOUS

## 2016-10-19 MED ORDER — SODIUM CHLORIDE 0.9 % IV SOLN
80.0000 mg/m2 | Freq: Once | INTRAVENOUS | Status: AC
  Administered 2016-10-19: 204 mg via INTRAVENOUS
  Filled 2016-10-19: qty 34

## 2016-10-19 MED ORDER — DEXAMETHASONE SODIUM PHOSPHATE 10 MG/ML IJ SOLN
INTRAMUSCULAR | Status: AC
  Filled 2016-10-19: qty 1

## 2016-10-19 MED ORDER — HEPARIN SOD (PORK) LOCK FLUSH 100 UNIT/ML IV SOLN
500.0000 [IU] | Freq: Once | INTRAVENOUS | Status: AC | PRN
  Administered 2016-10-19: 500 [IU]
  Filled 2016-10-19: qty 5

## 2016-10-19 MED ORDER — SODIUM CHLORIDE 0.9 % IV SOLN
Freq: Once | INTRAVENOUS | Status: AC
  Administered 2016-10-19: 11:00:00 via INTRAVENOUS

## 2016-10-19 MED ORDER — SODIUM CHLORIDE 0.9% FLUSH
10.0000 mL | INTRAVENOUS | Status: DC | PRN
  Administered 2016-10-19: 10 mL
  Filled 2016-10-19: qty 10

## 2016-10-19 MED ORDER — DIPHENHYDRAMINE HCL 25 MG PO CAPS
ORAL_CAPSULE | ORAL | Status: AC
  Filled 2016-10-19: qty 2

## 2016-10-19 MED ORDER — DIPHENHYDRAMINE HCL 50 MG/ML IJ SOLN: 50.0000 mg | Freq: Once | INTRAMUSCULAR | Status: AC

## 2016-10-19 MED ORDER — ONDANSETRON HCL 4 MG/2ML IJ SOLN
8.0000 mg | Freq: Once | INTRAMUSCULAR | Status: AC
  Administered 2016-10-19: 8 mg via INTRAVENOUS

## 2016-10-19 MED ORDER — ACETAMINOPHEN 325 MG PO TABS
650.0000 mg | ORAL_TABLET | Freq: Once | ORAL | Status: AC
  Administered 2016-10-19: 650 mg via ORAL

## 2016-10-19 MED ORDER — ONDANSETRON HCL 4 MG/2ML IJ SOLN
INTRAMUSCULAR | Status: AC
  Filled 2016-10-19: qty 4

## 2016-10-19 MED ORDER — SODIUM CHLORIDE 0.9 % IV SOLN: Freq: Once | INTRAVENOUS | Status: DC

## 2016-10-19 MED ORDER — TRASTUZUMAB CHEMO 150 MG IV SOLR
4.0000 mg/kg | Freq: Once | INTRAVENOUS | Status: AC
  Administered 2016-10-19: 609 mg via INTRAVENOUS
  Filled 2016-10-19: qty 29

## 2016-10-19 MED ORDER — ACETAMINOPHEN 325 MG PO TABS
ORAL_TABLET | ORAL | Status: AC
  Filled 2016-10-19: qty 2

## 2016-10-19 MED ORDER — DEXAMETHASONE SODIUM PHOSPHATE 10 MG/ML IJ SOLN
10.0000 mg | Freq: Once | INTRAMUSCULAR | Status: AC
  Administered 2016-10-19: 10 mg via INTRAVENOUS

## 2016-10-19 MED ORDER — DIPHENHYDRAMINE HCL 25 MG PO CAPS
50.0000 mg | ORAL_CAPSULE | Freq: Once | ORAL | Status: DC
  Administered 2016-10-19: 50 mg via ORAL

## 2016-10-19 NOTE — Patient Instructions (Signed)
[facility] Discharge Instructions for Patients Receiving Chemotherapy  Today you received the following chemotherapy agents trastuzumab (Herceptin) and paclitaxel (Taxol)  To help prevent nausea and vomiting after your treatment, we encourage you to take your nausea medication as directed by your doctor.   If you develop nausea and vomiting that is not controlled by your nausea medication, call the clinic.   BELOW ARE SYMPTOMS THAT SHOULD BE REPORTED IMMEDIATELY:  *FEVER GREATER THAN 100.5 F  *CHILLS WITH OR WITHOUT FEVER  NAUSEA AND VOMITING THAT IS NOT CONTROLLED WITH YOUR NAUSEA MEDICATION  *UNUSUAL SHORTNESS OF BREATH  *UNUSUAL BRUISING OR BLEEDING  TENDERNESS IN MOUTH AND THROAT WITH OR WITHOUT PRESENCE OF ULCERS  *URINARY PROBLEMS  *BOWEL PROBLEMS  UNUSUAL RASH Items with * indicate a potential emergency and should be followed up as soon as possible.  Feel free to call the clinic you have any questions or concerns. The clinic phone number is [phone].  Please show the [place] at check-in to the Emergency Department and triage nurse.  Acetaminophen tablets or caplets What is this medicine? ACETAMINOPHEN (a set a MEE noe fen) is a pain reliever. It is used to treat mild pain and fever. This medicine may be used for other purposes; ask your health care provider or pharmacist if you have questions. COMMON BRAND NAME(S): Aceta, Actamin, Anacin Aspirin Free, Genapap, Genebs, Mapap, Pain & Fever, Pain and Fever, PAIN RELIEF, PAIN RELIEF Extra Strength, Pain Reliever, Panadol, PHARBETOL, Q-Pap, Q-Pap Extra Strength, Tylenol, Tylenol CrushableTablet, Tylenol Extra Strength, XS No Aspirin, XS Pain Reliever What should I tell my health care provider before I take this medicine? They need to know if you have any of these conditions: -if you often drink alcohol -liver disease -an unusual or allergic reaction to acetaminophen, other medicines, foods,  dyes, or preservatives -pregnant or trying to get pregnant -breast-feeding How should I use this medicine? Take this medicine by mouth with a glass of water. Follow the directions on the package or prescription label. Take your medicine at regular intervals. Do not take your medicine more often than directed. Talk to your pediatrician regarding the use of this medicine in children. While this drug may be prescribed for children as young as 61 years of age for selected conditions, precautions do apply. Overdosage: If you think you have taken too much of this medicine contact a poison control center or emergency room at once. NOTE: This medicine is only for you. Do not share this medicine with others. What if I miss a dose? If you miss a dose, take it as soon as you can. If it is almost time for your next dose, take only that dose. Do not take double or extra doses. What may interact with this medicine? -alcohol -imatinib -isoniazid -other medicines with acetaminophen This list may not describe all possible interactions. Give your health care provider a list of all the medicines, herbs, non-prescription drugs, or dietary supplements you use. Also tell them if you smoke, drink alcohol, or use illegal drugs. Some items may interact with your medicine. What should I watch for while using this medicine? Tell your doctor or health care professional if the pain lasts more than 10 days (5 days for children), if it gets worse, or if there is a new or different kind of pain. Also, check with your doctor if a fever lasts for more than 3 days. Do not take other medicines that contain acetaminophen with this medicine. Always  read labels carefully. If you have questions, ask your doctor or pharmacist. If you take too much acetaminophen get medical help right away. Too much acetaminophen can be very dangerous and cause liver damage. Even if you do not have symptoms, it is important to get help right away. What  side effects may I notice from receiving this medicine? Side effects that you should report to your doctor or health care professional as soon as possible: -allergic reactions like skin rash, itching or hives, swelling of the face, lips, or tongue -breathing problems -fever or sore throat -redness, blistering, peeling or loosening of the skin, including inside the mouth -trouble passing urine or change in the amount of urine -unusual bleeding or bruising -unusually weak or tired -yellowing of the eyes or skin Side effects that usually do not require medical attention (report to your doctor or health care professional if they continue or are bothersome): -headache -nausea, stomach upset This list may not describe all possible side effects. Call your doctor for medical advice about side effects. You may report side effects to FDA at 1-800-FDA-1088. Where should I keep my medicine? Keep out of reach of children. Store at room temperature between 20 and 25 degrees C (68 and 77 degrees F). Protect from moisture and heat. Throw away any unused medicine after the expiration date. NOTE: This sheet is a summary. It may not cover all possible information. If you have questions about this medicine, talk to your doctor, pharmacist, or health care provider.  2018 Elsevier/Gold Standard (2013-02-06 12:54:16)  Diphenhydramine capsules or tablets What is this medicine? DIPHENHYDRAMINE (dye fen HYE dra meen) is an antihistamine. It is used to treat the symptoms of an allergic reaction. It is also used to treat Parkinson's disease. This medicine is also used to prevent and to treat motion sickness and as a nighttime sleep aid. This medicine may be used for other purposes; ask your health care provider or pharmacist if you have questions. COMMON BRAND NAME(S): Alka-Seltzer Plus Allergy, Aller-G-Time, Banophen, Benadryl Allergy, Benadryl Allergy Dye Free, Benadryl Allergy Kapgel, Benadryl Allergy Ultratab,  Diphedryl, Diphenhist, Genahist, PHARBEDRYL, Q-Dryl, Gretta Began, Valu-Dryl, Vicks ZzzQuil Nightime Sleep-Aid What should I tell my health care provider before I take this medicine? They need to know if you have any of these conditions: -asthma or lung disease -glaucoma -high blood pressure or heart disease -liver disease -pain or difficulty passing urine -prostate trouble -ulcers or other stomach problems -an unusual or allergic reaction to diphenhydramine, other medicines foods, dyes, or preservatives such as sulfites -pregnant or trying to get pregnant -breast-feeding How should I use this medicine? Take this medicine by mouth with a full glass of water. Follow the directions on the prescription label. Take your doses at regular intervals. Do not take your medicine more often than directed. To prevent motion sickness start taking this medicine 30 to 60 minutes before you leave. Talk to your pediatrician regarding the use of this medicine in children. Special care may be needed. Patients over 37 years old may have a stronger reaction and need a smaller dose. Overdosage: If you think you have taken too much of this medicine contact a poison control center or emergency room at once. NOTE: This medicine is only for you. Do not share this medicine with others. What if I miss a dose? If you miss a dose, take it as soon as you can. If it is almost time for your next dose, take only that dose. Do not take double or  extra doses. What may interact with this medicine? Do not take this medicine with any of the following medications: -MAOIs like Carbex, Eldepryl, Marplan, Nardil, and Parnate This medicine may also interact with the following medications: -alcohol -barbiturates, like phenobarbital -medicines for bladder spasm like oxybutynin, tolterodine -medicines for blood pressure -medicines for depression, anxiety, or psychotic disturbances -medicines for movement abnormalities or Parkinson's  disease -medicines for sleep -other medicines for cold, cough or allergy -some medicines for the stomach like chlordiazepoxide, dicyclomine This list may not describe all possible interactions. Give your health care provider a list of all the medicines, herbs, non-prescription drugs, or dietary supplements you use. Also tell them if you smoke, drink alcohol, or use illegal drugs. Some items may interact with your medicine. What should I watch for while using this medicine? Visit your doctor or health care professional for regular check ups. Tell your doctor if your symptoms do not improve or if they get worse. Your mouth may get dry. Chewing sugarless gum or sucking hard candy, and drinking plenty of water may help. Contact your doctor if the problem does not go away or is severe. This medicine may cause dry eyes and blurred vision. If you wear contact lenses you may feel some discomfort. Lubricating drops may help. See your eye doctor if the problem does not go away or is severe. You may get drowsy or dizzy. Do not drive, use machinery, or do anything that needs mental alertness until you know how this medicine affects you. Do not stand or sit up quickly, especially if you are an older patient. This reduces the risk of dizzy or fainting spells. Alcohol may interfere with the effect of this medicine. Avoid alcoholic drinks. What side effects may I notice from receiving this medicine? Side effects that you should report to your doctor or health care professional as soon as possible: -allergic reactions like skin rash, itching or hives, swelling of the face, lips, or tongue -changes in vision -confused, agitated, nervous -irregular or fast heartbeat -tremor -trouble passing urine -unusual bleeding or bruising -unusually weak or tired Side effects that usually do not require medical attention (report to your doctor or health care professional if they continue or are bothersome): -constipation,  diarrhea -drowsy -headache -loss of appetite -stomach upset, vomiting -thick mucous This list may not describe all possible side effects. Call your doctor for medical advice about side effects. You may report side effects to FDA at 1-800-FDA-1088. Where should I keep my medicine? Keep out of the reach of children. Store at room temperature between 15 and 30 degrees C (59 and 86 degrees F). Keep container closed tightly. Throw away any unused medicine after the expiration date. NOTE: This sheet is a summary. It may not cover all possible information. If you have questions about this medicine, talk to your doctor, pharmacist, or health care provider.  2018 Elsevier/Gold Standard (2007-10-03 17:06:22)  Ondansetron injection What is this medicine? ONDANSETRON (on DAN se tron) is used to treat nausea and vomiting caused by chemotherapy. It is also used to prevent or treat nausea and vomiting after surgery. This medicine may be used for other purposes; ask your health care provider or pharmacist if you have questions. COMMON BRAND NAME(S): Zofran What should I tell my health care provider before I take this medicine? They need to know if you have any of these conditions: -heart disease -history of irregular heartbeat -liver disease -low levels of magnesium or potassium in the blood -an unusual or allergic  reaction to ondansetron, granisetron, other medicines, foods, dyes, or preservatives -pregnant or trying to get pregnant -breast-feeding How should I use this medicine? This medicine is for infusion into a vein. It is given by a health care professional in a hospital or clinic setting. Talk to your pediatrician regarding the use of this medicine in children. Special care may be needed. Overdosage: If you think you have taken too much of this medicine contact a poison control center or emergency room at once. NOTE: This medicine is only for you. Do not share this medicine with others. What  if I miss a dose? This does not apply. What may interact with this medicine? Do not take this medicine with any of the following medications: -apomorphine -certain medicines for fungal infections like fluconazole, itraconazole, ketoconazole, posaconazole, voriconazole -cisapride -dofetilide -dronedarone -pimozide -thioridazine -ziprasidone This medicine may also interact with the following medications: -carbamazepine -certain medicines for depression, anxiety, or psychotic disturbances -fentanyl -linezolid -MAOIs like Carbex, Eldepryl, Marplan, Nardil, and Parnate -methylene blue (injected into a vein) -other medicines that prolong the QT interval (cause an abnormal heart rhythm) -phenytoin -rifampicin -tramadol This list may not describe all possible interactions. Give your health care provider a list of all the medicines, herbs, non-prescription drugs, or dietary supplements you use. Also tell them if you smoke, drink alcohol, or use illegal drugs. Some items may interact with your medicine. What should I watch for while using this medicine? Your condition will be monitored carefully while you are receiving this medicine. What side effects may I notice from receiving this medicine? Side effects that you should report to your doctor or health care professional as soon as possible: -allergic reactions like skin rash, itching or hives, swelling of the face, lips, or tongue -breathing problems -confusion -dizziness -fast or irregular heartbeat -feeling faint or lightheaded, falls -fever and chills -loss of balance or coordination -seizures -sweating -swelling of the hands and feet -tightness in the chest -tremors -unusually weak or tired Side effects that usually do not require medical attention (report to your doctor or health care professional if they continue or are bothersome): -constipation or diarrhea -headache This list may not describe all possible side effects.  Call your doctor for medical advice about side effects. You may report side effects to FDA at 1-800-FDA-1088. Where should I keep my medicine? This drug is given in a hospital or clinic and will not be stored at home. NOTE: This sheet is a summary. It may not cover all possible information. If you have questions about this medicine, talk to your doctor, pharmacist, or health care provider.  2018 Elsevier/Gold Standard (2013-03-22 16:18:28)  Dexamethasone injection What is this medicine? DEXAMETHASONE (dex a METH a sone) is a corticosteroid. It is used to treat inflammation of the skin, joints, lungs, and other organs. Common conditions treated include asthma, allergies, and arthritis. It is also used for other conditions, like blood disorders and diseases of the adrenal glands. This medicine may be used for other purposes; ask your health care provider or pharmacist if you have questions. COMMON BRAND NAME(S): Decadron, DoubleDex, Simplist Dexamethasone, Solurex What should I tell my health care provider before I take this medicine? They need to know if you have any of these conditions: -blood clotting problems -Cushing's syndrome -diabetes -glaucoma -heart problems or disease -high blood pressure -infection like herpes, measles, tuberculosis, or chickenpox -kidney disease -liver disease -mental problems -myasthenia gravis -osteoporosis -previous heart attack -seizures -stomach, ulcer or intestine disease including colitis and diverticulitis -  thyroid problem -an unusual or allergic reaction to dexamethasone, corticosteroids, other medicines, lactose, foods, dyes, or preservatives -pregnant or trying to get pregnant -breast-feeding How should I use this medicine? This medicine is for injection into a muscle, joint, lesion, soft tissue, or vein. It is given by a health care professional in a hospital or clinic setting. Talk to your pediatrician regarding the use of this medicine in  children. Special care may be needed. Overdosage: If you think you have taken too much of this medicine contact a poison control center or emergency room at once. NOTE: This medicine is only for you. Do not share this medicine with others. What if I miss a dose? This may not apply. If you are having a series of injections over a prolonged period, try not to miss an appointment. Call your doctor or health care professional to reschedule if you are unable to keep an appointment. What may interact with this medicine? Do not take this medicine with any of the following medications: -mifepristone, RU-486 -vaccines This medicine may also interact with the following medications: -amphotericin B -antibiotics like clarithromycin, erythromycin, and troleandomycin -aspirin and aspirin-like drugs -barbiturates like phenobarbital -carbamazepine -cholestyramine -cholinesterase inhibitors like donepezil, galantamine, rivastigmine, and tacrine -cyclosporine -digoxin -diuretics -ephedrine -female hormones, like estrogens or progestins and birth control pills -indinavir -isoniazid -ketoconazole -medicines for diabetes -medicines that improve muscle tone or strength for conditions like myasthenia gravis -NSAIDs, medicines for pain and inflammation, like ibuprofen or naproxen -phenytoin -rifampin -thalidomide -warfarin This list may not describe all possible interactions. Give your health care provider a list of all the medicines, herbs, non-prescription drugs, or dietary supplements you use. Also tell them if you smoke, drink alcohol, or use illegal drugs. Some items may interact with your medicine. What should I watch for while using this medicine? Your condition will be monitored carefully while you are receiving this medicine. If you are taking this medicine for a long time, carry an identification card with your name and address, the type and dose of your medicine, and your doctor's name and  address. This medicine may increase your risk of getting an infection. Stay away from people who are sick. Tell your doctor or health care professional if you are around anyone with measles or chickenpox. Talk to your health care provider before you get any vaccines that you take this medicine. If you are going to have surgery, tell your doctor or health care professional that you have taken this medicine within the last twelve months. Ask your doctor or health care professional about your diet. You may need to lower the amount of salt you eat. The medicine can increase your blood sugar. If you are a diabetic check with your doctor if you need help adjusting the dose of your diabetic medicine. What side effects may I notice from receiving this medicine? Side effects that you should report to your doctor or health care professional as soon as possible: -allergic reactions like skin rash, itching or hives, swelling of the face, lips, or tongue -black or tarry stools -change in the amount of urine -changes in vision -confusion, excitement, restlessness, a false sense of well-being -fever, sore throat, sneezing, cough, or other signs of infection, wounds that will not heal -hallucinations -increased thirst -mental depression, mood swings, mistaken feelings of self importance or of being mistreated -pain in hips, back, ribs, arms, shoulders, or legs -pain, redness, or irritation at the injection site -redness, blistering, peeling or loosening of the skin, including inside  the mouth -rounding out of face -swelling of feet or lower legs -unusual bleeding or bruising -unusual tired or weak -wounds that do not heal Side effects that usually do not require medical attention (report to your doctor or health care professional if they continue or are bothersome): -diarrhea or constipation -change in taste -headache -nausea, vomiting -skin problems, acne, thin and shiny skin -touble  sleeping -unusual growth of hair on the face or body -weight gain This list may not describe all possible side effects. Call your doctor for medical advice about side effects. You may report side effects to FDA at 1-800-FDA-1088. Where should I keep my medicine? This drug is given in a hospital or clinic and will not be stored at home. NOTE: This sheet is a summary. It may not cover all possible information. If you have questions about this medicine, talk to your doctor, pharmacist, or health care provider.  2018 Elsevier/Gold Standard (2007-10-06 14:04:12)  Famotidine injection What is this medicine? FAMOTIDINE (fa MOE ti deen) is a type of antihistamine that blocks the release of stomach acid. It is used to treat stomach or intestinal ulcers. It can relieve ulcer pain and discomfort, and the heartburn from acid reflux. This medicine may be used for other purposes; ask your health care provider or pharmacist if you have questions. COMMON BRAND NAME(S): Pepcid What should I tell my health care provider before I take this medicine? They need to know if you have any of these conditions: -kidney or liver disease -an unusual or allergic reaction to famotidine, other medicines, foods, dyes, or preservatives -pregnant or trying to get pregnant -breast-feeding How should I use this medicine? This medicine is for infusion into a vein. It is given by a health care professional in a hospital or clinic setting. Talk to your pediatrician regarding the use of this medicine in children. Special care may be needed. Overdosage: If you think you have taken too much of this medicine contact a poison control center or emergency room at once. NOTE: This medicine is only for you. Do not share this medicine with others. What if I miss a dose? This does not apply. What may interact with this medicine? -delavirdine -itraconazole -ketoconazole This list may not describe all possible interactions. Give your  health care provider a list of all the medicines, herbs, non-prescription drugs, or dietary supplements you use. Also tell them if you smoke, drink alcohol, or use illegal drugs. Some items may interact with your medicine. What should I watch for while using this medicine? Tell your doctor or health care professional if your condition does not start to get better or gets worse. Do not take with aspirin, ibuprofen, or other antiinflammatory medicines. These can aggravate your condition. Do not smoke cigarettes or drink alcohol. These increase irritation in your stomach and can increase the time it will take for ulcers to heal. Cigarettes and alcohol can also worsen acid reflux or heartburn. If you get black, tarry stools or vomit up what looks like coffee grounds, call your doctor or health care professional at once. You may have a bleeding ulcer. What side effects may I notice from receiving this medicine? Side effects that you should report to your doctor or health care professional as soon as possible: -allergic reactions like skin rash, itching or hives, swelling of the face, lips, or tongue -agitation, nervousness -confusion -hallucinations Side effects that usually do not require medical attention (report to your doctor or health care professional if they continue or  are bothersome): -constipation -diarrhea -dizziness -headache This list may not describe all possible side effects. Call your doctor for medical advice about side effects. You may report side effects to FDA at 1-800-FDA-1088. Where should I keep my medicine? This medicine is given in a hospital or clinic. You will not be given this medicine to store at home. NOTE: This sheet is a summary. It may not cover all possible information. If you have questions about this medicine, talk to your doctor, pharmacist, or health care provider.  2018 [employer]/Gold Standard (2007-10-19 13:24:51)  Trastuzumab injection for infusion What is  this medicine? TRASTUZUMAB (tras TOO zoo mab) is a monoclonal antibody. It is used to treat breast cancer and stomach cancer. This medicine may be used for other purposes; ask your health care provider or pharmacist if you have questions. COMMON BRAND NAME(S): Herceptin What should I tell my health care provider before I take this medicine? They need to know if you have any of these conditions: -heart disease -heart failure -lung or breathing disease, like asthma -an unusual or allergic reaction to trastuzumab, benzyl alcohol, or other medications, foods, dyes, or preservatives -pregnant or trying to get pregnant -breast-feeding How should I use this medicine? This drug is given as an infusion into a vein. It is administered in a hospital or clinic by a specially trained health care professional. Talk to your pediatrician regarding the use of this medicine in children. This medicine is not approved for use in children. Overdosage: If you think you have taken too much of this medicine contact a poison control center or emergency room at once. NOTE: This medicine is only for you. Do not share this medicine with others. What if I miss a dose? It is important not to miss a dose. Call your doctor or health care professional if you are unable to keep an appointment. What may interact with this medicine? This medicine may interact with the following medications: -certain types of chemotherapy, such as daunorubicin, doxorubicin, epirubicin, and idarubicin This list may not describe all possible interactions. Give your health care provider a list of all the medicines, herbs, non-prescription drugs, or dietary supplements you use. Also tell them if you smoke, drink alcohol, or use illegal drugs. Some items may interact with your medicine. What should I watch for while using this medicine? Visit your doctor for checks on your progress. Report any side effects. Continue your course of treatment even  though you feel ill unless your doctor tells you to stop. Call your doctor or health care professional for advice if you get a fever, chills or sore throat, or other symptoms of a cold or flu. Do not treat yourself. Try to avoid being around people who are sick. You may experience fever, chills and shaking during your first infusion. These effects are usually mild and can be treated with other medicines. Report any side effects during the infusion to your health care professional. Fever and chills usually do not happen with later infusions. Do not become pregnant while taking this medicine or for 7 months after stopping it. Women should inform their doctor if they wish to become pregnant or think they might be pregnant. Women of child-bearing potential will need to have a negative pregnancy test before starting this medicine. There is a potential for serious side effects to an unborn child. Talk to your health care professional or pharmacist for more information. Do not breast-feed an infant while taking this medicine or for 7 months after stopping  it. Women must use effective birth control with this medicine. What side effects may I notice from receiving this medicine? Side effects that you should report to your doctor or health care professional as soon as possible: -allergic reactions like skin rash, itching or hives, swelling of the face, lips, or tongue -chest pain or palpitations -cough -dizziness -feeling faint or lightheaded, falls -fever -general ill feeling or flu-like symptoms -signs of worsening heart failure like breathing problems; swelling in your legs and feet -unusually weak or tired Side effects that usually do not require medical attention (report to your doctor or health care professional if they continue or are bothersome): -bone pain -changes in taste -diarrhea -joint pain -nausea/vomiting -weight loss This list may not describe all possible side effects. Call your doctor  for medical advice about side effects. You may report side effects to FDA at 1-800-FDA-1088. Where should I keep my medicine? This drug is given in a hospital or clinic and will not be stored at home. NOTE: This sheet is a summary. It may not cover all possible information. If you have questions about this medicine, talk to your doctor, pharmacist, or health care provider.  2018 Elsevier/Gold Standard (2016-06-09 14:37:52)  Paclitaxel injection What is this medicine? PACLITAXEL (PAK li TAX el) is a chemotherapy drug. It targets fast dividing cells, like cancer cells, and causes these cells to die. This medicine is used to treat ovarian cancer, breast cancer, and other cancers. This medicine may be used for other purposes; ask your health care provider or pharmacist if you have questions. COMMON BRAND NAME(S): Onxol, Taxol What should I tell my health care provider before I take this medicine? They need to know if you have any of these conditions: -blood disorders -irregular heartbeat -infection (especially a virus infection such as chickenpox, cold sores, or herpes) -liver disease -previous or ongoing radiation therapy -an unusual or allergic reaction to paclitaxel, alcohol, polyoxyethylated castor oil, other chemotherapy agents, other medicines, foods, dyes, or preservatives -pregnant or trying to get pregnant -breast-feeding How should I use this medicine? This drug is given as an infusion into a vein. It is administered in a hospital or clinic by a specially trained health care professional. Talk to your pediatrician regarding the use of this medicine in children. Special care may be needed. Overdosage: If you think you have taken too much of this medicine contact a poison control center or emergency room at once. NOTE: This medicine is only for you. Do not share this medicine with others. What if I miss a dose? It is important not to miss your dose. Call your doctor or health care  professional if you are unable to keep an appointment. What may interact with this medicine? Do not take this medicine with any of the following medications: -disulfiram -metronidazole This medicine may also interact with the following medications: -cyclosporine -diazepam -ketoconazole -medicines to increase blood counts like filgrastim, pegfilgrastim, sargramostim -other chemotherapy drugs like cisplatin, doxorubicin, epirubicin, etoposide, teniposide, vincristine -quinidine -testosterone -vaccines -verapamil Talk to your doctor or health care professional before taking any of these medicines: -acetaminophen -aspirin -ibuprofen -ketoprofen -naproxen This list may not describe all possible interactions. Give your health care provider a list of all the medicines, herbs, non-prescription drugs, or dietary supplements you use. Also tell them if you smoke, drink alcohol, or use illegal drugs. Some items may interact with your medicine. What should I watch for while using this medicine? Your condition will be monitored carefully while you are receiving  this medicine. You will need important blood work done while you are taking this medicine. This medicine can cause serious allergic reactions. To reduce your risk you will need to take other medicine(s) before treatment with this medicine. If you experience allergic reactions like skin rash, itching or hives, swelling of the face, lips, or tongue, tell your doctor or health care professional right away. In some cases, you may be given additional medicines to help with side effects. Follow all directions for their use. This drug may make you feel generally unwell. This is not uncommon, as chemotherapy can affect healthy cells as well as cancer cells. Report any side effects. Continue your course of treatment even though you feel ill unless your doctor tells you to stop. Call your doctor or health care professional for advice if you get a fever,  chills or sore throat, or other symptoms of a cold or flu. Do not treat yourself. This drug decreases your body's ability to fight infections. Try to avoid being around people who are sick. This medicine may increase your risk to bruise or bleed. Call your doctor or health care professional if you notice any unusual bleeding. Be careful brushing and flossing your teeth or using a toothpick because you may get an infection or bleed more easily. If you have any dental work done, tell your dentist you are receiving this medicine. Avoid taking products that contain aspirin, acetaminophen, ibuprofen, naproxen, or ketoprofen unless instructed by your doctor. These medicines may hide a fever. Do not become pregnant while taking this medicine. Women should inform their doctor if they wish to become pregnant or think they might be pregnant. There is a potential for serious side effects to an unborn child. Talk to your health care professional or pharmacist for more information. Do not breast-feed an infant while taking this medicine. Men are advised not to father a child while receiving this medicine. This product may contain alcohol. Ask your pharmacist or healthcare provider if this medicine contains alcohol. Be sure to tell all healthcare providers you are taking this medicine. Certain medicines, like metronidazole and disulfiram, can cause an unpleasant reaction when taken with alcohol. The reaction includes flushing, headache, nausea, vomiting, sweating, and increased thirst. The reaction can last from 30 minutes to several hours. What side effects may I notice from receiving this medicine? Side effects that you should report to your doctor or health care professional as soon as possible: -allergic reactions like skin rash, itching or hives, swelling of the face, lips, or tongue -low blood counts - This drug may decrease the number of white blood cells, red blood cells and platelets. You may be at increased  risk for infections and bleeding. -signs of infection - fever or chills, cough, sore throat, pain or difficulty passing urine -signs of decreased platelets or bleeding - bruising, pinpoint red spots on the skin, black, tarry stools, nosebleeds -signs of decreased red blood cells - unusually weak or tired, fainting spells, lightheadedness -breathing problems -chest pain -high or low blood pressure -mouth sores -nausea and vomiting -pain, swelling, redness or irritation at the injection site -pain, tingling, numbness in the hands or feet -slow or irregular heartbeat -swelling of the ankle, feet, hands Side effects that usually do not require medical attention (report to your doctor or health care professional if they continue or are bothersome): -bone pain -complete hair loss including hair on your head, underarms, pubic hair, eyebrows, and eyelashes -changes in the color of fingernails -diarrhea -loosening of  the fingernails -loss of appetite -muscle or joint pain -red flush to skin -sweating This list may not describe all possible side effects. Call your doctor for medical advice about side effects. You may report side effects to FDA at 1-800-FDA-1088. Where should I keep my medicine? This drug is given in a hospital or clinic and will not be stored at home. NOTE: This sheet is a summary. It may not cover all possible information. If you have questions about this medicine, talk to your doctor, pharmacist, or health care provider.  2018 Elsevier/Gold Standard (2015-04-16 19:58:00)

## 2016-10-19 NOTE — Progress Notes (Signed)
First-time taxol started at 1434. Pt began to exhibit flushing on face and chest. Otherwise asymptomatic. Taxol paused at 1439 and IVF started. Attempted to notify Marvene Staff, NP without contact. Dr. Lindi Adie called and chairside at 1447. Face and chest flushing resolved. No additional medications given at this time per Dr. Lindi Adie. Okay to rechallenge taxol.

## 2016-10-19 NOTE — Progress Notes (Signed)
Patient Care Team: Janith Lima, MD as PCP - General (Internal Medicine) Alphonsa Overall, MD as Consulting Physician (General Surgery) Nicholas Lose, MD as Consulting Physician (Hematology and Oncology) Eppie Gibson, MD as Attending Physician (Radiation Oncology)  DIAGNOSIS:  Encounter Diagnosis  Name Primary?  . Malignant neoplasm of lower-inner quadrant of left breast in female, estrogen receptor positive ([place])     SUMMARY OF ONCOLOGIC HISTORY:   Malignant neoplasm of lower-inner quadrant of left breast in female, estrogen receptor positive ([place])   08/18/2016 Initial Diagnosis    Left breast biopsy 8:00 retroareolar position: IDC, high-grade with DCIS high-grade, ER 90%, PR 95%, Ki-67 20%, HER-2 positive ratio 3.4; screening detected left breast distortion LIQ 8 8:00 retroareolar 0.8 cm, axilla negative, T1b N0 stage IA clinical stage      09/22/2016 Surgery    Left lumpectomy: IDC grade 3, 1.3 cm, DCIS high-grade, margins clear, 0/1 lymph node negative, T1CN 0 stage IA, ER 90%, PR 95%, Ki-67 20%, HER-2 positive ratio 3.4      10/19/2016 -  Chemotherapy    Taxol Herceptin weekly 12 followed by Herceptin maintenance every 3 weeks for 1 year        CHIEF COMPLIANT: Cycle 1 Taxol Herceptin weekly  INTERVAL HISTORY: Sandra Wood is a 61 year old with above-mentioned history left breast cancer underwent lumpectomy and is here today to start cycle 1 of Taxol Herceptin weekly chemotherapy adjuvant treatment. She has healed very well from the prior surgery. She denies any significant pain or discomfort. She is anxious to get started with the treatment.  REVIEW OF SYSTEMS:   Constitutional: Denies fevers, chills or abnormal weight loss Eyes: Denies blurriness of vision Ears, nose, mouth, throat, and face: Denies mucositis or sore throat Respiratory: Denies cough, dyspnea or wheezes Cardiovascular: Denies palpitation, chest discomfort Gastrointestinal:  Denies nausea, heartburn  or change in bowel habits Skin: Denies abnormal skin rashes Lymphatics: Denies new lymphadenopathy or easy bruising Neurological:Denies numbness, tingling or new weaknesses Behavioral/Psych: Mood is stable, no new changes  Extremities: No lower extremity edema Breast:  denies any pain or lumps or nodules in either breasts All other systems were reviewed with the patient and are negative.  I have reviewed the past medical history, past surgical history, social history and family history with the patient and they are unchanged from previous note.  ALLERGIES:  is allergic to no known allergies.  MEDICATIONS:  Current Outpatient Prescriptions  Medication Sig Dispense Refill  . HYDROcodone-acetaminophen (NORCO/VICODIN) 5-325 MG tablet Take 1-2 tablets by mouth every 6 (six) hours as needed. 20 tablet 0  . ibuprofen (ADVIL,MOTRIN) 200 MG tablet Take 400-600 mg by mouth every 6 (six) hours as needed for headache or mild pain (depends on pain if takes 2-3 tablets).     Marland Kitchen ipratropium-albuterol (DUONEB) 0.5-2.5 (3) MG/3ML SOLN Inhale 3 mLs into the lungs every 4 (four) hours as needed (shortness of breath).     . lidocaine-prilocaine (EMLA) cream Apply to affected area once 30 g 3  . lidocaine-prilocaine (EMLA) cream Apply to affected area once 30 g 3  . LORazepam (ATIVAN) 0.5 MG tablet Take 1 tablet (0.5 mg total) by mouth every 6 (six) hours as needed (Nausea or vomiting). 30 tablet 0  . ondansetron (ZOFRAN) 8 MG tablet Take 1 tablet (8 mg total) by mouth 2 (two) times daily as needed (Nausea or vomiting). 30 tablet 1  . PROAIR HFA 108 (90 Base) MCG/ACT inhaler Inhale 2 puffs into the lungs every 6 (  six) hours as needed for wheezing or shortness of breath.     . prochlorperazine (COMPAZINE) 10 MG tablet Take 1 tablet (10 mg total) by mouth every 6 (six) hours as needed (Nausea or vomiting). 30 tablet 1  . sertraline (ZOLOFT) 50 MG tablet Take 1 tablet (50 mg total) by mouth daily. (Patient taking  differently: Take 50 mg by mouth daily as needed. ) 90 tablet 3   No current facility-administered medications for this visit.     PHYSICAL EXAMINATION: ECOG PERFORMANCE STATUS: 1 - Symptomatic but completely ambulatory  Vitals:   10/19/16 0958  BP: (!) 147/75  Pulse: 87  Resp: 18  Temp: 98.2 F (36.8 C)   Filed Weights   10/19/16 0958  Weight: (!) 334 lb 1.6 oz (151.5 kg)    GENERAL:alert, no distress and comfortable SKIN: skin color, texture, turgor are normal, no rashes or significant lesions EYES: normal, Conjunctiva are pink and non-injected, sclera clear OROPHARYNX:no exudate, no erythema and lips, buccal mucosa, and tongue normal  NECK: supple, thyroid normal size, non-tender, without nodularity LYMPH:  no palpable lymphadenopathy in the cervical, axillary or inguinal LUNGS: clear to auscultation and percussion with normal breathing effort HEART: regular rate & rhythm and no murmurs and no lower extremity edema ABDOMEN:abdomen soft, non-tender and normal bowel sounds MUSCULOSKELETAL:no cyanosis of digits and no clubbing  NEURO: alert & oriented x 3 with fluent speech, no focal motor/sensory deficits EXTREMITIES: No lower extremity edema  LABORATORY DATA:  I have reviewed the data as listed   Chemistry      Component Value Date/Time   NA 140 10/19/2016 0853   K 3.9 10/19/2016 0853   CL 103 09/15/2016 1255   CO2 23 10/19/2016 0853   BUN 14.7 10/19/2016 0853   CREATININE 0.9 10/19/2016 0853      Component Value Date/Time   CALCIUM 9.0 10/19/2016 0853   ALKPHOS 148 10/19/2016 0853   AST 13 10/19/2016 0853   ALT 19 10/19/2016 0853   BILITOT 0.44 10/19/2016 0853       Lab Results  Component Value Date   WBC 13.0 (H) 10/19/2016   HGB 13.9 10/19/2016   HCT 42.4 10/19/2016   MCV 83.0 10/19/2016   PLT 287 10/19/2016   NEUTROABS 9.4 (H) 10/19/2016    ASSESSMENT & PLAN:  Malignant neoplasm of lower-inner quadrant of left breast in female, estrogen  receptor positive ([facility]) Left breast biopsy02/20/2018:8:00 retroareolar position: IDC, high-grade with DCIS high-grade, ER 90%, PR 95%, Ki-67 20%, HER-2 positive ratio 3.4; screening detected left breast distortion LIQ 8 8:00 retroareolar 0.8 cm, axilla negative, T1b N0 stage IA clinical stage Left lumpectomy 09/22/2016: IDC grade 3, 1.3 cm, DCIS high-grade, margins clear, 0/1 lymph node negative, T1 CN 0 stage IA, ER 90%, PR 95%, Ki-67 20%, HER-2 positive ratio 3.4  Recommendation: 1. Adjuvant chemotherapy and Herceptin with weekly Taxol and Herceptin 12 followed by Herceptin maintenance for 1 year 3. Followed by adjuvant radiation 4. Followed by adjuvant antiestrogen therapy with letrozole 2.5 mg daily 5 years ----------------------------------------------------------------------------- Current Treatment: Taxol Herceptin Cycle 1 Chemo education completed Consent obtained Labs reviewed Anti emetics reviewed  RTC in 1 week for tox check and for cycle 2.  I spent 25 minutes talking to the patient of which more than half was spent in counseling and coordination of care.  No orders of the defined types were placed in this encounter.  The patient has a good understanding of the overall plan. she agrees with it. she  will call with any problems that may develop before the next visit here.   Rulon Eisenmenger, MD 10/19/16

## 2016-10-19 NOTE — Progress Notes (Signed)
Pt completed taxol transfusion with no further reactions. Discussed AVS and pt verbalized understanding. Pt was asymptomatic upon d/c.

## 2016-10-20 ENCOUNTER — Telehealth: Payer: Self-pay

## 2016-10-20 NOTE — Telephone Encounter (Signed)
-----   Message from Johann Capers, RN sent at 10/19/2016 11:03 AM EDT ----- Regarding: Dr. Lindi Adie pt f/u phone call Dr. Lindi Adie f/u phone call. Pt first-time herceptin/taxol.

## 2016-10-20 NOTE — Telephone Encounter (Signed)
lvm this is chemo f/u call. Please call us with any questions or concerns.

## 2016-10-26 ENCOUNTER — Ambulatory Visit (HOSPITAL_BASED_OUTPATIENT_CLINIC_OR_DEPARTMENT_OTHER): Payer: No Typology Code available for payment source | Admitting: Hematology and Oncology

## 2016-10-26 ENCOUNTER — Encounter: Payer: Self-pay | Admitting: Hematology and Oncology

## 2016-10-26 ENCOUNTER — Ambulatory Visit: Payer: No Typology Code available for payment source

## 2016-10-26 ENCOUNTER — Other Ambulatory Visit (HOSPITAL_BASED_OUTPATIENT_CLINIC_OR_DEPARTMENT_OTHER): Payer: No Typology Code available for payment source

## 2016-10-26 ENCOUNTER — Ambulatory Visit (HOSPITAL_BASED_OUTPATIENT_CLINIC_OR_DEPARTMENT_OTHER): Payer: No Typology Code available for payment source

## 2016-10-26 DIAGNOSIS — Z170 Estrogen receptor positive status [ER+]: Principal | ICD-10-CM

## 2016-10-26 DIAGNOSIS — Z5111 Encounter for antineoplastic chemotherapy: Secondary | ICD-10-CM | POA: Diagnosis not present

## 2016-10-26 DIAGNOSIS — Z5112 Encounter for antineoplastic immunotherapy: Secondary | ICD-10-CM

## 2016-10-26 DIAGNOSIS — Z95828 Presence of other vascular implants and grafts: Secondary | ICD-10-CM

## 2016-10-26 DIAGNOSIS — C50312 Malignant neoplasm of lower-inner quadrant of left female breast: Secondary | ICD-10-CM

## 2016-10-26 LAB — COMPREHENSIVE METABOLIC PANEL
ALBUMIN: 3.2 g/dL — AB (ref 3.5–5.0)
ALK PHOS: 141 U/L (ref 40–150)
ALT: 29 U/L (ref 0–55)
AST: 16 U/L (ref 5–34)
Anion Gap: 10 mEq/L (ref 3–11)
BILIRUBIN TOTAL: 0.43 mg/dL (ref 0.20–1.20)
BUN: 18.8 mg/dL (ref 7.0–26.0)
CALCIUM: 8.9 mg/dL (ref 8.4–10.4)
CO2: 24 mEq/L (ref 22–29)
CREATININE: 0.7 mg/dL (ref 0.6–1.1)
Chloride: 107 mEq/L (ref 98–109)
EGFR: 87 mL/min/{1.73_m2} — ABNORMAL LOW (ref >90)
Glucose: 146 mg/dl — ABNORMAL HIGH (ref 70–140)
Potassium: 4 mEq/L (ref 3.5–5.1)
Sodium: 141 mEq/L (ref 136–145)
TOTAL PROTEIN: 7.2 g/dL (ref 6.4–8.3)

## 2016-10-26 LAB — CBC WITH DIFFERENTIAL/PLATELET
BASO%: 0.4 % (ref 0.0–2.0)
Basophils Absolute: 0 10*3/uL (ref 0.0–0.1)
EOS%: 3.9 % (ref 0.0–7.0)
Eosinophils Absolute: 0.3 10*3/uL (ref 0.0–0.5)
HEMATOCRIT: 40.9 % (ref 34.8–46.6)
HEMOGLOBIN: 12.9 g/dL (ref 11.6–15.9)
LYMPH#: 2.1 10*3/uL (ref 0.9–3.3)
LYMPH%: 27.5 % (ref 14.0–49.7)
MCH: 27 pg (ref 25.1–34.0)
MCHC: 31.5 g/dL (ref 31.5–36.0)
MCV: 85.6 fL (ref 79.5–101.0)
MONO#: 0.4 10*3/uL (ref 0.1–0.9)
MONO%: 5.3 % (ref 0.0–14.0)
NEUT%: 62.9 % (ref 38.4–76.8)
NEUTROS ABS: 4.9 10*3/uL (ref 1.5–6.5)
PLATELETS: 269 10*3/uL (ref 145–400)
RBC: 4.78 10*6/uL (ref 3.70–5.45)
RDW: 15.2 % — AB (ref 11.2–14.5)
WBC: 7.7 10*3/uL (ref 3.9–10.3)

## 2016-10-26 MED ORDER — TRASTUZUMAB CHEMO 150 MG IV SOLR
2.0000 mg/kg | Freq: Once | INTRAVENOUS | Status: AC
  Administered 2016-10-26: 294 mg via INTRAVENOUS
  Filled 2016-10-26: qty 14

## 2016-10-26 MED ORDER — SODIUM CHLORIDE 0.9% FLUSH
10.0000 mL | INTRAVENOUS | Status: DC | PRN
  Administered 2016-10-26: 10 mL
  Filled 2016-10-26: qty 10

## 2016-10-26 MED ORDER — HEPARIN SOD (PORK) LOCK FLUSH 100 UNIT/ML IV SOLN
500.0000 [IU] | Freq: Once | INTRAVENOUS | Status: AC | PRN
  Administered 2016-10-26: 500 [IU]
  Filled 2016-10-26: qty 5

## 2016-10-26 MED ORDER — ACETAMINOPHEN 325 MG PO TABS
650.0000 mg | ORAL_TABLET | Freq: Once | ORAL | Status: AC
  Administered 2016-10-26: 650 mg via ORAL

## 2016-10-26 MED ORDER — DEXAMETHASONE SODIUM PHOSPHATE 10 MG/ML IJ SOLN
10.0000 mg | Freq: Once | INTRAMUSCULAR | Status: AC
  Administered 2016-10-26: 10 mg via INTRAVENOUS

## 2016-10-26 MED ORDER — ONDANSETRON HCL 4 MG/2ML IJ SOLN
8.0000 mg | Freq: Once | INTRAMUSCULAR | Status: AC
  Administered 2016-10-26: 8 mg via INTRAVENOUS

## 2016-10-26 MED ORDER — ONDANSETRON HCL 4 MG/2ML IJ SOLN
INTRAMUSCULAR | Status: AC
  Filled 2016-10-26: qty 4

## 2016-10-26 MED ORDER — SODIUM CHLORIDE 0.9% FLUSH
10.0000 mL | INTRAVENOUS | Status: DC | PRN
  Administered 2016-10-26: 10 mL via INTRAVENOUS
  Filled 2016-10-26: qty 10

## 2016-10-26 MED ORDER — SODIUM CHLORIDE 0.9 % IV SOLN
Freq: Once | INTRAVENOUS | Status: AC
  Administered 2016-10-26: 12:00:00 via INTRAVENOUS

## 2016-10-26 MED ORDER — FAMOTIDINE IN NACL 20-0.9 MG/50ML-% IV SOLN
20.0000 mg | Freq: Once | INTRAVENOUS | Status: AC
  Administered 2016-10-26: 20 mg via INTRAVENOUS

## 2016-10-26 MED ORDER — DIPHENHYDRAMINE HCL 25 MG PO CAPS
50.0000 mg | ORAL_CAPSULE | Freq: Once | ORAL | Status: AC
  Administered 2016-10-26: 50 mg via ORAL

## 2016-10-26 MED ORDER — DIPHENHYDRAMINE HCL 50 MG/ML IJ SOLN: 50.0000 mg | Freq: Once | INTRAMUSCULAR | Status: DC

## 2016-10-26 MED ORDER — DEXAMETHASONE SODIUM PHOSPHATE 100 MG/10ML IJ SOLN: 10.0000 mg | Freq: Once | INTRAMUSCULAR | Status: DC

## 2016-10-26 MED ORDER — FAMOTIDINE IN NACL 20-0.9 MG/50ML-% IV SOLN
INTRAVENOUS | Status: AC
  Filled 2016-10-26: qty 50

## 2016-10-26 MED ORDER — ACETAMINOPHEN 325 MG PO TABS
ORAL_TABLET | ORAL | Status: AC
  Filled 2016-10-26: qty 2

## 2016-10-26 MED ORDER — DEXAMETHASONE SODIUM PHOSPHATE 10 MG/ML IJ SOLN
INTRAMUSCULAR | Status: AC
  Filled 2016-10-26: qty 1

## 2016-10-26 MED ORDER — SODIUM CHLORIDE 0.9 % IV SOLN
80.0000 mg/m2 | Freq: Once | INTRAVENOUS | Status: AC
  Administered 2016-10-26: 204 mg via INTRAVENOUS
  Filled 2016-10-26: qty 34

## 2016-10-26 MED ORDER — SODIUM CHLORIDE 0.9 % IV SOLN: Freq: Once | INTRAVENOUS | Status: DC

## 2016-10-26 MED ORDER — DIPHENHYDRAMINE HCL 25 MG PO CAPS
ORAL_CAPSULE | ORAL | Status: AC
  Filled 2016-10-26: qty 2

## 2016-10-26 NOTE — Patient Instructions (Signed)
[facility] Discharge Instructions for Patients Receiving Chemotherapy  Today you received the following chemotherapy agents trastuzumab (Herceptin) and paclitaxel (Taxol)  To help prevent nausea and vomiting after your treatment, we encourage you to take your nausea medication as directed by your doctor.   If you develop nausea and vomiting that is not controlled by your nausea medication, call the clinic.   BELOW ARE SYMPTOMS THAT SHOULD BE REPORTED IMMEDIATELY:  *FEVER GREATER THAN 100.5 F  *CHILLS WITH OR WITHOUT FEVER  NAUSEA AND VOMITING THAT IS NOT CONTROLLED WITH YOUR NAUSEA MEDICATION  *UNUSUAL SHORTNESS OF BREATH  *UNUSUAL BRUISING OR BLEEDING  TENDERNESS IN MOUTH AND THROAT WITH OR WITHOUT PRESENCE OF ULCERS  *URINARY PROBLEMS  *BOWEL PROBLEMS  UNUSUAL RASH Items with * indicate a potential emergency and should be followed up as soon as possible.  Feel free to call the clinic you have any questions or concerns. The clinic phone number is [phone].  Please show the [facility] at check-in to the Emergency Department and triage nurse.

## 2016-10-26 NOTE — Assessment & Plan Note (Signed)
Left breast biopsy02/20/2018:8:00 retroareolar position: IDC, high-grade with DCIS high-grade, ER 90%, PR 95%, Ki-67 20%, HER-2 positive ratio 3.4; screening detected left breast distortion LIQ 8 8:00 retroareolar 0.8 cm, axilla negative, T1b N0 stage IA clinical stage Left lumpectomy 09/22/2016: IDC grade 3, 1.3 cm, DCIS high-grade, margins clear, 0/1 lymph node negative, T1 CN 0 stage IA, ER 90%, PR 95%, Ki-67 20%, HER-2 positive ratio 3.4  Recommendation: 1. Adjuvant chemotherapy and Herceptin with weekly Taxol and Herceptin 12 followed by Herceptin maintenance for 1 year 3. Followed by adjuvant radiation 4. Followed by adjuvant antiestrogen therapy with letrozole 2.5 mg daily 5 years ----------------------------------------------------------------------------- Current Treatment: Taxol Herceptin Cycle 2 Chemotherapy toxicities:   Labs reviewed Monitoring closely for chemotherapy toxicities  RTC in 2 week for tox check and for cycle 4.

## 2016-10-26 NOTE — Progress Notes (Signed)
Patient Care Team: Janith Lima, MD as PCP - General (Internal Medicine) Alphonsa Overall, MD as Consulting Physician (General Surgery) Nicholas Lose, MD as Consulting Physician (Hematology and Oncology) Eppie Gibson, MD as Attending Physician (Radiation Oncology)  DIAGNOSIS:  Encounter Diagnosis  Name Primary?  . Malignant neoplasm of lower-inner quadrant of left breast in female, estrogen receptor positive ([place])     SUMMARY OF ONCOLOGIC HISTORY:   Malignant neoplasm of lower-inner quadrant of left breast in female, estrogen receptor positive ([place])   08/18/2016 Initial Diagnosis    Left breast biopsy 8:00 retroareolar position: IDC, high-grade with DCIS high-grade, ER 90%, PR 95%, Ki-67 20%, HER-2 positive ratio 3.4; screening detected left breast distortion LIQ 8 8:00 retroareolar 0.8 cm, axilla negative, T1b N0 stage IA clinical stage      09/22/2016 Surgery    Left lumpectomy: IDC grade 3, 1.3 cm, DCIS high-grade, margins clear, 0/1 lymph node negative, T1CN 0 stage IA, ER 90%, PR 95%, Ki-67 20%, HER-2 positive ratio 3.4      10/19/2016 -  Chemotherapy    Taxol Herceptin weekly 12 followed by Herceptin maintenance every 3 weeks for 1 year        CHIEF COMPLIANT: Taxol Herceptin cycle 2  INTERVAL HISTORY: Sandra Wood is a 61 year old with above-mentioned history of left breast cancer treated with lumpectomy and is currently on adjuvant chemotherapy. Today is cycle 2 of Taxol Herceptin. She tolerated cycle 1 fairly well. She did not have any nausea vomiting or bowel disturbances. She had some muscle aches and pains as well as some redness related to Taxol. Her blood sugars are being carefully monitored.  REVIEW OF SYSTEMS:   Constitutional: Denies fevers, chills or abnormal weight loss, complains of fatigue Eyes: Denies blurriness of vision Ears, nose, mouth, throat, and face: Denies mucositis or sore throat Respiratory: Denies cough, dyspnea or wheezes Cardiovascular:  Denies palpitation, chest discomfort Gastrointestinal:  Denies nausea, heartburn or change in bowel habits Skin: Flushing sensation and redness Lymphatics: Denies new lymphadenopathy or easy bruising Neurological: Muscle aches and pains Behavioral/Psych: Mood is stable, no new changes  Extremities: No lower extremity edema All other systems were reviewed with the patient and are negative.  I have reviewed the past medical history, past surgical history, social history and family history with the patient and they are unchanged from previous note.  ALLERGIES:  is allergic to no known allergies.  MEDICATIONS:  Current Outpatient Prescriptions  Medication Sig Dispense Refill  . HYDROcodone-acetaminophen (NORCO/VICODIN) 5-325 MG tablet Take 1-2 tablets by mouth every 6 (six) hours as needed. 20 tablet 0  . ibuprofen (ADVIL,MOTRIN) 200 MG tablet Take 400-600 mg by mouth every 6 (six) hours as needed for headache or mild pain (depends on pain if takes 2-3 tablets).     Marland Kitchen ipratropium-albuterol (DUONEB) 0.5-2.5 (3) MG/3ML SOLN Inhale 3 mLs into the lungs every 4 (four) hours as needed (shortness of breath).     . lidocaine-prilocaine (EMLA) cream Apply to affected area once 30 g 3  . lidocaine-prilocaine (EMLA) cream Apply to affected area once 30 g 3  . LORazepam (ATIVAN) 0.5 MG tablet Take 1 tablet (0.5 mg total) by mouth every 6 (six) hours as needed (Nausea or vomiting). 30 tablet 0  . ondansetron (ZOFRAN) 8 MG tablet Take 1 tablet (8 mg total) by mouth 2 (two) times daily as needed (Nausea or vomiting). 30 tablet 1  . PROAIR HFA 108 (90 Base) MCG/ACT inhaler Inhale 2 puffs into the lungs  every 6 (six) hours as needed for wheezing or shortness of breath.     . prochlorperazine (COMPAZINE) 10 MG tablet Take 1 tablet (10 mg total) by mouth every 6 (six) hours as needed (Nausea or vomiting). 30 tablet 1  . sertraline (ZOLOFT) 50 MG tablet Take 1 tablet (50 mg total) by mouth daily. (Patient taking  differently: Take 50 mg by mouth daily as needed. ) 90 tablet 3   No current facility-administered medications for this visit.     PHYSICAL EXAMINATION: ECOG PERFORMANCE STATUS: 1 - Symptomatic but completely ambulatory  Vitals:   10/26/16 1053  BP: (!) 142/73  Pulse: 88  Resp: 18  Temp: 98.1 F (36.7 C)   Filed Weights   10/26/16 1053  Weight: (!) 335 lb 12.8 oz (152.3 kg)    GENERAL:alert, no distress and comfortable SKIN: skin color, texture, turgor are normal, no rashes or significant lesions EYES: normal, Conjunctiva are pink and non-injected, sclera clear OROPHARYNX:no exudate, no erythema and lips, buccal mucosa, and tongue normal  NECK: supple, thyroid normal size, non-tender, without nodularity LYMPH:  no palpable lymphadenopathy in the cervical, axillary or inguinal LUNGS: clear to auscultation and percussion with normal breathing effort HEART: regular rate & rhythm and no murmurs and no lower extremity edema ABDOMEN:abdomen soft, non-tender and normal bowel sounds MUSCULOSKELETAL:no cyanosis of digits and no clubbing  NEURO: alert & oriented x 3 with fluent speech, no focal motor/sensory deficits EXTREMITIES: No lower extremity edema  LABORATORY DATA:  I have reviewed the data as listed   Chemistry      Component Value Date/Time   NA 140 10/19/2016 0853   K 3.9 10/19/2016 0853   CL 103 09/15/2016 1255   CO2 23 10/19/2016 0853   BUN 14.7 10/19/2016 0853   CREATININE 0.9 10/19/2016 0853      Component Value Date/Time   CALCIUM 9.0 10/19/2016 0853   ALKPHOS 148 10/19/2016 0853   AST 13 10/19/2016 0853   ALT 19 10/19/2016 0853   BILITOT 0.44 10/19/2016 0853       Lab Results  Component Value Date   WBC 7.7 10/26/2016   HGB 12.9 10/26/2016   HCT 40.9 10/26/2016   MCV 85.6 10/26/2016   PLT 269 10/26/2016   NEUTROABS 4.9 10/26/2016    ASSESSMENT & PLAN:  Malignant neoplasm of lower-inner quadrant of left breast in female, estrogen receptor  positive ([facility]) Left breast biopsy02/20/2018:8:00 retroareolar position: IDC, high-grade with DCIS high-grade, ER 90%, PR 95%, Ki-67 20%, HER-2 positive ratio 3.4; screening detected left breast distortion LIQ 8 8:00 retroareolar 0.8 cm, axilla negative, T1b N0 stage IA clinical stage Left lumpectomy 09/22/2016: IDC grade 3, 1.3 cm, DCIS high-grade, margins clear, 0/1 lymph node negative, T1 CN 0 stage IA, ER 90%, PR 95%, Ki-67 20%, HER-2 positive ratio 3.4  Recommendation: 1. Adjuvant chemotherapy and Herceptin with weekly Taxol and Herceptin 12 followed by Herceptin maintenance for 1 year 3. Followed by adjuvant radiation 4. Followed by adjuvant antiestrogen therapy with letrozole 2.5 mg daily 5 years ----------------------------------------------------------------------------- Current Treatment: Taxol Herceptin Cycle 2 Chemotherapy toxicities:  1. Bone and muscle pain 2. Fatigue  Diabetes with elevated blood sugars: We are carefully monitoring them. Labs reviewed Monitoring closely for chemotherapy toxicities  RTC in 2 week for tox check and for cycle 4.   I spent 25 minutes talking to the patient of which more than half was spent in counseling and coordination of care.  No orders of the defined types were placed  in this encounter.  The patient has a good understanding of the overall plan. she agrees with it. she will call with any problems that may develop before the next visit here.   Rulon Eisenmenger, MD 10/26/16

## 2016-10-26 NOTE — Patient Instructions (Signed)
Implanted Port Home Guide An implanted port is a type of central line that is placed under the skin. Central lines are used to provide IV access when treatment or nutrition needs to be given through a person's veins. Implanted ports are used for long-term IV access. An implanted port may be placed because:  You need IV medicine that would be irritating to the small veins in your hands or arms.  You need long-term IV medicines, such as antibiotics.  You need IV nutrition for a long period.  You need frequent blood draws for lab tests.  You need dialysis.  Implanted ports are usually placed in the chest area, but they can also be placed in the upper arm, the abdomen, or the leg. An implanted port has two main parts:  Reservoir. The reservoir is round and will appear as a small, raised area under your skin. The reservoir is the part where a needle is inserted to give medicines or draw blood.  Catheter. The catheter is a thin, flexible tube that extends from the reservoir. The catheter is placed into a large vein. Medicine that is inserted into the reservoir goes into the catheter and then into the vein.  How will I care for my incision site? Do not get the incision site wet. Bathe or shower as directed by your health care provider. How is my port accessed? Special steps must be taken to access the port:  Before the port is accessed, a numbing cream can be placed on the skin. This helps numb the skin over the port site.  Your health care provider uses a sterile technique to access the port. ? Your health care provider must put on a mask and sterile gloves. ? The skin over your port is cleaned carefully with an antiseptic and allowed to dry. ? The port is gently pinched between sterile gloves, and a needle is inserted into the port.  Only "non-coring" port needles should be used to access the port. Once the port is accessed, a blood return should be checked. This helps ensure that the port  is in the vein and is not clogged.  If your port needs to remain accessed for a constant infusion, a clear (transparent) bandage will be placed over the needle site. The bandage and needle will need to be changed every week, or as directed by your health care provider.  Keep the bandage covering the needle clean and dry. Do not get it wet. Follow your health care provider's instructions on how to take a shower or bath while the port is accessed.  If your port does not need to stay accessed, no bandage is needed over the port.  What is flushing? Flushing helps keep the port from getting clogged. Follow your health care provider's instructions on how and when to flush the port. Ports are usually flushed with saline solution or a medicine called heparin. The need for flushing will depend on how the port is used.  If the port is used for intermittent medicines or blood draws, the port will need to be flushed: ? After medicines have been given. ? After blood has been drawn. ? As part of routine maintenance.  If a constant infusion is running, the port may not need to be flushed.  How long will my port stay implanted? The port can stay in for as long as your health care provider thinks it is needed. When it is time for the port to come out, surgery will be   done to remove it. The procedure is similar to the one performed when the port was put in. When should I seek immediate medical care? When you have an implanted port, you should seek immediate medical care if:  You notice a bad smell coming from the incision site.  You have swelling, redness, or drainage at the incision site.  You have more swelling or pain at the port site or the surrounding area.  You have a fever that is not controlled with medicine.  This information is not intended to replace advice given to you by your health care provider. Make sure you discuss any questions you have with your health care provider. Document  Released: 06/15/2005 Document Revised: 11/21/2015 Document Reviewed: 02/20/2013 Elsevier Interactive Patient Education  2017 [employer]  

## 2016-11-02 ENCOUNTER — Ambulatory Visit (HOSPITAL_BASED_OUTPATIENT_CLINIC_OR_DEPARTMENT_OTHER): Payer: No Typology Code available for payment source

## 2016-11-02 ENCOUNTER — Ambulatory Visit: Payer: No Typology Code available for payment source

## 2016-11-02 ENCOUNTER — Other Ambulatory Visit (HOSPITAL_BASED_OUTPATIENT_CLINIC_OR_DEPARTMENT_OTHER): Payer: No Typology Code available for payment source

## 2016-11-02 VITALS — BP 131/66 | HR 84 | Temp 98.8°F | Resp 18

## 2016-11-02 DIAGNOSIS — Z5112 Encounter for antineoplastic immunotherapy: Secondary | ICD-10-CM | POA: Diagnosis not present

## 2016-11-02 DIAGNOSIS — C50312 Malignant neoplasm of lower-inner quadrant of left female breast: Secondary | ICD-10-CM

## 2016-11-02 DIAGNOSIS — Z5111 Encounter for antineoplastic chemotherapy: Secondary | ICD-10-CM

## 2016-11-02 DIAGNOSIS — Z170 Estrogen receptor positive status [ER+]: Principal | ICD-10-CM

## 2016-11-02 DIAGNOSIS — Z95828 Presence of other vascular implants and grafts: Secondary | ICD-10-CM

## 2016-11-02 LAB — COMPREHENSIVE METABOLIC PANEL
ALT: 32 U/L (ref 0–55)
ANION GAP: 8 meq/L (ref 3–11)
AST: 13 U/L (ref 5–34)
Albumin: 3.2 g/dL — ABNORMAL LOW (ref 3.5–5.0)
Alkaline Phosphatase: 143 U/L (ref 40–150)
BILIRUBIN TOTAL: 0.56 mg/dL (ref 0.20–1.20)
BUN: 13.1 mg/dL (ref 7.0–26.0)
CALCIUM: 9.4 mg/dL (ref 8.4–10.4)
CO2: 25 meq/L (ref 22–29)
CREATININE: 0.7 mg/dL (ref 0.6–1.1)
Chloride: 107 mEq/L (ref 98–109)
EGFR: 87 mL/min/{1.73_m2} — AB (ref >90)
Glucose: 182 mg/dl — ABNORMAL HIGH (ref 70–140)
Potassium: 3.8 mEq/L (ref 3.5–5.1)
Sodium: 141 mEq/L (ref 136–145)
TOTAL PROTEIN: 7.2 g/dL (ref 6.4–8.3)

## 2016-11-02 LAB — CBC WITH DIFFERENTIAL/PLATELET
BASO%: 0.9 % (ref 0.0–2.0)
Basophils Absolute: 0.1 10*3/uL (ref 0.0–0.1)
EOS ABS: 0.2 10*3/uL (ref 0.0–0.5)
EOS%: 2.8 % (ref 0.0–7.0)
HEMATOCRIT: 41.9 % (ref 34.8–46.6)
HGB: 13.5 g/dL (ref 11.6–15.9)
LYMPH#: 1.6 10*3/uL (ref 0.9–3.3)
LYMPH%: 26.3 % (ref 14.0–49.7)
MCH: 26.9 pg (ref 25.1–34.0)
MCHC: 32.1 g/dL (ref 31.5–36.0)
MCV: 83.5 fL (ref 79.5–101.0)
MONO#: 0.4 10*3/uL (ref 0.1–0.9)
MONO%: 6.6 % (ref 0.0–14.0)
NEUT%: 63.4 % (ref 38.4–76.8)
NEUTROS ABS: 3.9 10*3/uL (ref 1.5–6.5)
PLATELETS: 343 10*3/uL (ref 145–400)
RBC: 5.01 10*6/uL (ref 3.70–5.45)
RDW: 15.6 % — ABNORMAL HIGH (ref 11.2–14.5)
WBC: 6.1 10*3/uL (ref 3.9–10.3)

## 2016-11-02 MED ORDER — ONDANSETRON HCL 4 MG/2ML IJ SOLN
8.0000 mg | Freq: Once | INTRAMUSCULAR | Status: AC
  Administered 2016-11-02: 8 mg via INTRAVENOUS

## 2016-11-02 MED ORDER — SODIUM CHLORIDE 0.9 % IV SOLN
Freq: Once | INTRAVENOUS | Status: AC
  Administered 2016-11-02: 13:00:00 via INTRAVENOUS

## 2016-11-02 MED ORDER — DIPHENHYDRAMINE HCL 50 MG/ML IJ SOLN
50.0000 mg | Freq: Once | INTRAMUSCULAR | Status: AC
  Administered 2016-11-02: 50 mg via INTRAVENOUS

## 2016-11-02 MED ORDER — ACETAMINOPHEN 325 MG PO TABS
ORAL_TABLET | ORAL | Status: AC
  Filled 2016-11-02: qty 2

## 2016-11-02 MED ORDER — HEPARIN SOD (PORK) LOCK FLUSH 100 UNIT/ML IV SOLN
500.0000 [IU] | Freq: Once | INTRAVENOUS | Status: AC | PRN
  Administered 2016-11-02: 500 [IU]
  Filled 2016-11-02: qty 5

## 2016-11-02 MED ORDER — DEXAMETHASONE SODIUM PHOSPHATE 10 MG/ML IJ SOLN
10.0000 mg | Freq: Once | INTRAMUSCULAR | Status: AC
  Administered 2016-11-02: 10 mg via INTRAVENOUS

## 2016-11-02 MED ORDER — FAMOTIDINE IN NACL 20-0.9 MG/50ML-% IV SOLN
20.0000 mg | Freq: Once | INTRAVENOUS | Status: AC
  Administered 2016-11-02: 20 mg via INTRAVENOUS

## 2016-11-02 MED ORDER — TRASTUZUMAB CHEMO 150 MG IV SOLR
2.0000 mg/kg | Freq: Once | INTRAVENOUS | Status: AC
  Administered 2016-11-02: 294 mg via INTRAVENOUS
  Filled 2016-11-02: qty 14

## 2016-11-02 MED ORDER — DEXAMETHASONE SODIUM PHOSPHATE 10 MG/ML IJ SOLN
INTRAMUSCULAR | Status: AC
  Filled 2016-11-02: qty 1

## 2016-11-02 MED ORDER — FAMOTIDINE IN NACL 20-0.9 MG/50ML-% IV SOLN
INTRAVENOUS | Status: AC
  Filled 2016-11-02: qty 50

## 2016-11-02 MED ORDER — DIPHENHYDRAMINE HCL 50 MG/ML IJ SOLN
INTRAMUSCULAR | Status: AC
  Filled 2016-11-02: qty 1

## 2016-11-02 MED ORDER — PACLITAXEL CHEMO INJECTION 300 MG/50ML
80.0000 mg/m2 | Freq: Once | INTRAVENOUS | Status: AC
  Administered 2016-11-02: 204 mg via INTRAVENOUS
  Filled 2016-11-02: qty 34

## 2016-11-02 MED ORDER — SODIUM CHLORIDE 0.9% FLUSH
10.0000 mL | INTRAVENOUS | Status: DC | PRN
  Administered 2016-11-02: 10 mL via INTRAVENOUS
  Filled 2016-11-02: qty 10

## 2016-11-02 MED ORDER — SODIUM CHLORIDE 0.9% FLUSH
10.0000 mL | INTRAVENOUS | Status: DC | PRN
  Administered 2016-11-02: 10 mL
  Filled 2016-11-02: qty 10

## 2016-11-02 MED ORDER — ACETAMINOPHEN 325 MG PO TABS
650.0000 mg | ORAL_TABLET | Freq: Once | ORAL | Status: AC
  Administered 2016-11-02: 650 mg via ORAL

## 2016-11-02 MED ORDER — ONDANSETRON HCL 4 MG/2ML IJ SOLN
INTRAMUSCULAR | Status: AC
  Filled 2016-11-02: qty 4

## 2016-11-02 NOTE — Patient Instructions (Signed)
[facility] Discharge Instructions for Patients Receiving Chemotherapy  Today you received the following chemotherapy agents Paclitaxel/Carboplatin.   To help prevent nausea and vomiting after your treatment, we encourage you to take your nausea medication as directed.    If you develop nausea and vomiting that is not controlled by your nausea medication, call the clinic.   BELOW ARE SYMPTOMS THAT SHOULD BE REPORTED IMMEDIATELY:  *FEVER GREATER THAN 100.5 F  *CHILLS WITH OR WITHOUT FEVER  NAUSEA AND VOMITING THAT IS NOT CONTROLLED WITH YOUR NAUSEA MEDICATION  *UNUSUAL SHORTNESS OF BREATH  *UNUSUAL BRUISING OR BLEEDING  TENDERNESS IN MOUTH AND THROAT WITH OR WITHOUT PRESENCE OF ULCERS  *URINARY PROBLEMS  *BOWEL PROBLEMS  UNUSUAL RASH Items with * indicate a potential emergency and should be followed up as soon as possible.  Feel free to call the clinic you have any questions or concerns. The clinic phone number is [phone].  Please show the CHEMO ALERT CARD at check-in to the Emergency Department and triage nurse.   

## 2016-11-09 ENCOUNTER — Other Ambulatory Visit (HOSPITAL_BASED_OUTPATIENT_CLINIC_OR_DEPARTMENT_OTHER): Payer: No Typology Code available for payment source

## 2016-11-09 ENCOUNTER — Encounter: Payer: Self-pay | Admitting: Hematology and Oncology

## 2016-11-09 ENCOUNTER — Ambulatory Visit: Payer: No Typology Code available for payment source

## 2016-11-09 ENCOUNTER — Ambulatory Visit (HOSPITAL_BASED_OUTPATIENT_CLINIC_OR_DEPARTMENT_OTHER): Payer: No Typology Code available for payment source | Admitting: Hematology and Oncology

## 2016-11-09 ENCOUNTER — Ambulatory Visit (HOSPITAL_BASED_OUTPATIENT_CLINIC_OR_DEPARTMENT_OTHER): Payer: No Typology Code available for payment source

## 2016-11-09 DIAGNOSIS — Z170 Estrogen receptor positive status [ER+]: Principal | ICD-10-CM

## 2016-11-09 DIAGNOSIS — Z5112 Encounter for antineoplastic immunotherapy: Secondary | ICD-10-CM | POA: Diagnosis not present

## 2016-11-09 DIAGNOSIS — L271 Localized skin eruption due to drugs and medicaments taken internally: Secondary | ICD-10-CM

## 2016-11-09 DIAGNOSIS — C50312 Malignant neoplasm of lower-inner quadrant of left female breast: Secondary | ICD-10-CM

## 2016-11-09 DIAGNOSIS — Z5111 Encounter for antineoplastic chemotherapy: Secondary | ICD-10-CM | POA: Diagnosis not present

## 2016-11-09 DIAGNOSIS — Z95828 Presence of other vascular implants and grafts: Secondary | ICD-10-CM

## 2016-11-09 LAB — CBC WITH DIFFERENTIAL/PLATELET
BASO%: 1 % (ref 0.0–2.0)
Basophils Absolute: 0.1 10*3/uL (ref 0.0–0.1)
EOS ABS: 0.1 10*3/uL (ref 0.0–0.5)
EOS%: 2.2 % (ref 0.0–7.0)
HEMATOCRIT: 40.2 % (ref 34.8–46.6)
HGB: 13.2 g/dL (ref 11.6–15.9)
LYMPH#: 1.7 10*3/uL (ref 0.9–3.3)
LYMPH%: 25.5 % (ref 14.0–49.7)
MCH: 27.5 pg (ref 25.1–34.0)
MCHC: 32.8 g/dL (ref 31.5–36.0)
MCV: 83.7 fL (ref 79.5–101.0)
MONO#: 0.6 10*3/uL (ref 0.1–0.9)
MONO%: 8.4 % (ref 0.0–14.0)
NEUT%: 62.9 % (ref 38.4–76.8)
NEUTROS ABS: 4.1 10*3/uL (ref 1.5–6.5)
PLATELETS: 403 10*3/uL — AB (ref 145–400)
RBC: 4.8 10*6/uL (ref 3.70–5.45)
RDW: 16 % — AB (ref 11.2–14.5)
WBC: 6.6 10*3/uL (ref 3.9–10.3)

## 2016-11-09 LAB — COMPREHENSIVE METABOLIC PANEL
ALT: 26 U/L (ref 0–55)
ANION GAP: 9 meq/L (ref 3–11)
AST: 14 U/L (ref 5–34)
Albumin: 3.1 g/dL — ABNORMAL LOW (ref 3.5–5.0)
Alkaline Phosphatase: 133 U/L (ref 40–150)
BILIRUBIN TOTAL: 0.43 mg/dL (ref 0.20–1.20)
BUN: 12.5 mg/dL (ref 7.0–26.0)
CO2: 25 mEq/L (ref 22–29)
CREATININE: 0.7 mg/dL (ref 0.6–1.1)
Calcium: 9.1 mg/dL (ref 8.4–10.4)
Chloride: 108 mEq/L (ref 98–109)
EGFR: 88 mL/min/{1.73_m2} — AB (ref >90)
Glucose: 170 mg/dl — ABNORMAL HIGH (ref 70–140)
Potassium: 3.7 mEq/L (ref 3.5–5.1)
Sodium: 142 mEq/L (ref 136–145)
TOTAL PROTEIN: 7.2 g/dL (ref 6.4–8.3)

## 2016-11-09 LAB — TECHNOLOGIST REVIEW

## 2016-11-09 MED ORDER — TRASTUZUMAB CHEMO 150 MG IV SOLR
2.0000 mg/kg | Freq: Once | INTRAVENOUS | Status: AC
  Administered 2016-11-09: 294 mg via INTRAVENOUS
  Filled 2016-11-09: qty 14

## 2016-11-09 MED ORDER — AZITHROMYCIN 250 MG PO TABS: ORAL_TABLET | ORAL | 0 refills | Status: DC

## 2016-11-09 MED ORDER — ONDANSETRON HCL 4 MG/2ML IJ SOLN
8.0000 mg | Freq: Once | INTRAMUSCULAR | Status: AC
  Administered 2016-11-09: 8 mg via INTRAVENOUS

## 2016-11-09 MED ORDER — ACETAMINOPHEN 325 MG PO TABS
650.0000 mg | ORAL_TABLET | Freq: Once | ORAL | Status: AC
  Administered 2016-11-09: 650 mg via ORAL

## 2016-11-09 MED ORDER — SODIUM CHLORIDE 0.9 % IV SOLN
Freq: Once | INTRAVENOUS | Status: AC
  Administered 2016-11-09: 12:00:00 via INTRAVENOUS

## 2016-11-09 MED ORDER — SODIUM CHLORIDE 0.9 % IV SOLN
80.0000 mg/m2 | Freq: Once | INTRAVENOUS | Status: AC
  Administered 2016-11-09: 204 mg via INTRAVENOUS
  Filled 2016-11-09: qty 34

## 2016-11-09 MED ORDER — DEXAMETHASONE SODIUM PHOSPHATE 10 MG/ML IJ SOLN
INTRAMUSCULAR | Status: AC
  Filled 2016-11-09: qty 1

## 2016-11-09 MED ORDER — SODIUM CHLORIDE 0.9% FLUSH
10.0000 mL | INTRAVENOUS | Status: DC | PRN
  Administered 2016-11-09: 10 mL via INTRAVENOUS
  Filled 2016-11-09: qty 10

## 2016-11-09 MED ORDER — HEPARIN SOD (PORK) LOCK FLUSH 100 UNIT/ML IV SOLN
500.0000 [IU] | Freq: Once | INTRAVENOUS | Status: DC | PRN
  Filled 2016-11-09: qty 5

## 2016-11-09 MED ORDER — FAMOTIDINE IN NACL 20-0.9 MG/50ML-% IV SOLN
20.0000 mg | Freq: Once | INTRAVENOUS | Status: AC
  Administered 2016-11-09: 20 mg via INTRAVENOUS

## 2016-11-09 MED ORDER — DIPHENHYDRAMINE HCL 50 MG/ML IJ SOLN
50.0000 mg | Freq: Once | INTRAMUSCULAR | Status: AC
  Administered 2016-11-09: 50 mg via INTRAVENOUS

## 2016-11-09 MED ORDER — FAMOTIDINE IN NACL 20-0.9 MG/50ML-% IV SOLN
INTRAVENOUS | Status: AC
  Filled 2016-11-09: qty 50

## 2016-11-09 MED ORDER — DIPHENHYDRAMINE HCL 25 MG PO CAPS: 50.0000 mg | ORAL_CAPSULE | Freq: Once | ORAL | Status: DC

## 2016-11-09 MED ORDER — DIPHENHYDRAMINE HCL 50 MG/ML IJ SOLN
INTRAMUSCULAR | Status: AC
  Filled 2016-11-09: qty 1

## 2016-11-09 MED ORDER — ONDANSETRON HCL 4 MG/2ML IJ SOLN
INTRAMUSCULAR | Status: AC
  Filled 2016-11-09: qty 4

## 2016-11-09 MED ORDER — ACETAMINOPHEN 325 MG PO TABS
ORAL_TABLET | ORAL | Status: AC
  Filled 2016-11-09: qty 2

## 2016-11-09 MED ORDER — DEXAMETHASONE SODIUM PHOSPHATE 10 MG/ML IJ SOLN
10.0000 mg | Freq: Once | INTRAMUSCULAR | Status: AC
  Administered 2016-11-09: 10 mg via INTRAVENOUS

## 2016-11-09 MED ORDER — SODIUM CHLORIDE 0.9% FLUSH
10.0000 mL | INTRAVENOUS | Status: DC | PRN
  Administered 2016-11-09: 10 mL
  Filled 2016-11-09: qty 10

## 2016-11-09 NOTE — Assessment & Plan Note (Signed)
Left breast biopsy02/20/2018:8:00 retroareolar position: IDC, high-grade with DCIS high-grade, ER 90%, PR 95%, Ki-67 20%, HER-2 positive ratio 3.4; screening detected left breast distortion LIQ 8 8:00 retroareolar 0.8 cm, axilla negative, T1b N0 stage IA clinical stage Left lumpectomy 09/22/2016: IDC grade 3, 1.3 cm, DCIS high-grade, margins clear, 0/1 lymph node negative, T1 CN 0 stage IA, ER 90%, PR 95%, Ki-67 20%, HER-2 positive ratio 3.4  Recommendation: 1. Adjuvant chemotherapy and Herceptin with weekly Taxol and Herceptin 12 followed by Herceptin maintenance for 1 year 3. Followed by adjuvant radiation 4. Followed by adjuvant antiestrogen therapy with letrozole 2.5 mg daily 5 years ----------------------------------------------------------------------------- Current Treatment: Taxol Herceptin Cycle 4 Chemotherapy toxicities:  1. Bone and muscle pain 2. Fatigue  Diabetes with elevated blood sugars: We are carefully monitoring them. Labs reviewed Monitoring closely for chemotherapy toxicities  RTC in 2 week for tox check and for cycle 6.

## 2016-11-09 NOTE — Patient Instructions (Signed)
[facility] Discharge Instructions for Patients Receiving Chemotherapy  Today you received the following chemotherapy agents Herceptin and Taxol  To help prevent nausea and vomiting after your treatment, we encourage you to take your nausea medication as directed.    If you develop nausea and vomiting that is not controlled by your nausea medication, call the clinic.   BELOW ARE SYMPTOMS THAT SHOULD BE REPORTED IMMEDIATELY:  *FEVER GREATER THAN 100.5 F  *CHILLS WITH OR WITHOUT FEVER  NAUSEA AND VOMITING THAT IS NOT CONTROLLED WITH YOUR NAUSEA MEDICATION  *UNUSUAL SHORTNESS OF BREATH  *UNUSUAL BRUISING OR BLEEDING  TENDERNESS IN MOUTH AND THROAT WITH OR WITHOUT PRESENCE OF ULCERS  *URINARY PROBLEMS  *BOWEL PROBLEMS  UNUSUAL RASH Items with * indicate a potential emergency and should be followed up as soon as possible.  Feel free to call the clinic you have any questions or concerns. The clinic phone number is [phone].  Please show the [place] at check-in to the Emergency Department and triage nurse.  Trastuzumab injection for infusion HERCEPTIN What is this medicine? TRASTUZUMAB (tras TOO zoo mab) is a monoclonal antibody. It is used to treat breast cancer and stomach cancer. This medicine may be used for other purposes; ask your health care provider or pharmacist if you have questions. COMMON BRAND NAME(S): Herceptin What should I tell my health care provider before I take this medicine? They need to know if you have any of these conditions: -heart disease -heart failure -lung or breathing disease, like asthma -an unusual or allergic reaction to trastuzumab, benzyl alcohol, or other medications, foods, dyes, or preservatives -pregnant or trying to get pregnant -breast-feeding How should I use this medicine? This drug is given as an infusion into a vein. It is administered in a hospital or clinic by a specially trained health care  professional. Talk to your pediatrician regarding the use of this medicine in children. This medicine is not approved for use in children. Overdosage: If you think you have taken too much of this medicine contact a poison control center or emergency room at once. NOTE: This medicine is only for you. Do not share this medicine with others. What if I miss a dose? It is important not to miss a dose. Call your doctor or health care professional if you are unable to keep an appointment. What may interact with this medicine? This medicine may interact with the following medications: -certain types of chemotherapy, such as daunorubicin, doxorubicin, epirubicin, and idarubicin This list may not describe all possible interactions. Give your health care provider a list of all the medicines, herbs, non-prescription drugs, or dietary supplements you use. Also tell them if you smoke, drink alcohol, or use illegal drugs. Some items may interact with your medicine. What should I watch for while using this medicine? Visit your doctor for checks on your progress. Report any side effects. Continue your course of treatment even though you feel ill unless your doctor tells you to stop. Call your doctor or health care professional for advice if you get a fever, chills or sore throat, or other symptoms of a cold or flu. Do not treat yourself. Try to avoid being around people who are sick. You may experience fever, chills and shaking during your first infusion. These effects are usually mild and can be treated with other medicines. Report any side effects during the infusion to your health care professional. Fever and chills usually do not happen with later infusions. Do not  become pregnant while taking this medicine or for 7 months after stopping it. Women should inform their doctor if they wish to become pregnant or think they might be pregnant. Women of child-bearing potential will need to have a negative pregnancy test  before starting this medicine. There is a potential for serious side effects to an unborn child. Talk to your health care professional or pharmacist for more information. Do not breast-feed an infant while taking this medicine or for 7 months after stopping it. Women must use effective birth control with this medicine. What side effects may I notice from receiving this medicine? Side effects that you should report to your doctor or health care professional as soon as possible: -allergic reactions like skin rash, itching or hives, swelling of the face, lips, or tongue -chest pain or palpitations -cough -dizziness -feeling faint or lightheaded, falls -fever -general ill feeling or flu-like symptoms -signs of worsening heart failure like breathing problems; swelling in your legs and feet -unusually weak or tired Side effects that usually do not require medical attention (report to your doctor or health care professional if they continue or are bothersome): -bone pain -changes in taste -diarrhea -joint pain -nausea/vomiting -weight loss This list may not describe all possible side effects. Call your doctor for medical advice about side effects. You may report side effects to FDA at 1-800-FDA-1088. Where should I keep my medicine? This drug is given in a hospital or clinic and will not be stored at home. NOTE: This sheet is a summary. It may not cover all possible information. If you have questions about this medicine, talk to your doctor, pharmacist, or health care provider.  2018 Elsevier/Gold Standard (2016-06-09 14:37:52)   Paclitaxel injection TAXOL What is this medicine? PACLITAXEL (PAK li TAX el) is a chemotherapy drug. It targets fast dividing cells, like cancer cells, and causes these cells to die. This medicine is used to treat ovarian cancer, breast cancer, and other cancers. This medicine may be used for other purposes; ask your health care provider or pharmacist if you have  questions. COMMON BRAND NAME(S): Onxol, Taxol What should I tell my health care provider before I take this medicine? They need to know if you have any of these conditions: -blood disorders -irregular heartbeat -infection (especially a virus infection such as chickenpox, cold sores, or herpes) -liver disease -previous or ongoing radiation therapy -an unusual or allergic reaction to paclitaxel, alcohol, polyoxyethylated castor oil, other chemotherapy agents, other medicines, foods, dyes, or preservatives -pregnant or trying to get pregnant -breast-feeding How should I use this medicine? This drug is given as an infusion into a vein. It is administered in a hospital or clinic by a specially trained health care professional. Talk to your pediatrician regarding the use of this medicine in children. Special care may be needed. Overdosage: If you think you have taken too much of this medicine contact a poison control center or emergency room at once. NOTE: This medicine is only for you. Do not share this medicine with others. What if I miss a dose? It is important not to miss your dose. Call your doctor or health care professional if you are unable to keep an appointment. What may interact with this medicine? Do not take this medicine with any of the following medications: -disulfiram -metronidazole This medicine may also interact with the following medications: -cyclosporine -diazepam -ketoconazole -medicines to increase blood counts like filgrastim, pegfilgrastim, sargramostim -other chemotherapy drugs like cisplatin, doxorubicin, epirubicin, etoposide, teniposide, vincristine -quinidine -testosterone -  vaccines -verapamil Talk to your doctor or health care professional before taking any of these medicines: -acetaminophen -aspirin -ibuprofen -ketoprofen -naproxen This list may not describe all possible interactions. Give your health care provider a list of all the medicines, herbs,  non-prescription drugs, or dietary supplements you use. Also tell them if you smoke, drink alcohol, or use illegal drugs. Some items may interact with your medicine. What should I watch for while using this medicine? Your condition will be monitored carefully while you are receiving this medicine. You will need important blood work done while you are taking this medicine. This medicine can cause serious allergic reactions. To reduce your risk you will need to take other medicine(s) before treatment with this medicine. If you experience allergic reactions like skin rash, itching or hives, swelling of the face, lips, or tongue, tell your doctor or health care professional right away. In some cases, you may be given additional medicines to help with side effects. Follow all directions for their use. This drug may make you feel generally unwell. This is not uncommon, as chemotherapy can affect healthy cells as well as cancer cells. Report any side effects. Continue your course of treatment even though you feel ill unless your doctor tells you to stop. Call your doctor or health care professional for advice if you get a fever, chills or sore throat, or other symptoms of a cold or flu. Do not treat yourself. This drug decreases your body's ability to fight infections. Try to avoid being around people who are sick. This medicine may increase your risk to bruise or bleed. Call your doctor or health care professional if you notice any unusual bleeding. Be careful brushing and flossing your teeth or using a toothpick because you may get an infection or bleed more easily. If you have any dental work done, tell your dentist you are receiving this medicine. Avoid taking products that contain aspirin, acetaminophen, ibuprofen, naproxen, or ketoprofen unless instructed by your doctor. These medicines may hide a fever. Do not become pregnant while taking this medicine. Women should inform their doctor if they wish to  become pregnant or think they might be pregnant. There is a potential for serious side effects to an unborn child. Talk to your health care professional or pharmacist for more information. Do not breast-feed an infant while taking this medicine. Men are advised not to father a child while receiving this medicine. This product may contain alcohol. Ask your pharmacist or healthcare provider if this medicine contains alcohol. Be sure to tell all healthcare providers you are taking this medicine. Certain medicines, like metronidazole and disulfiram, can cause an unpleasant reaction when taken with alcohol. The reaction includes flushing, headache, nausea, vomiting, sweating, and increased thirst. The reaction can last from 30 minutes to several hours. What side effects may I notice from receiving this medicine? Side effects that you should report to your doctor or health care professional as soon as possible: -allergic reactions like skin rash, itching or hives, swelling of the face, lips, or tongue -low blood counts - This drug may decrease the number of white blood cells, red blood cells and platelets. You may be at increased risk for infections and bleeding. -signs of infection - fever or chills, cough, sore throat, pain or difficulty passing urine -signs of decreased platelets or bleeding - bruising, pinpoint red spots on the skin, black, tarry stools, nosebleeds -signs of decreased red blood cells - unusually weak or tired, fainting spells, lightheadedness -breathing problems -chest  pain -high or low blood pressure -mouth sores -nausea and vomiting -pain, swelling, redness or irritation at the injection site -pain, tingling, numbness in the hands or feet -slow or irregular heartbeat -swelling of the ankle, feet, hands Side effects that usually do not require medical attention (report to your doctor or health care professional if they continue or are bothersome): -bone pain -complete hair loss  including hair on your head, underarms, pubic hair, eyebrows, and eyelashes -changes in the color of fingernails -diarrhea -loosening of the fingernails -loss of appetite -muscle or joint pain -red flush to skin -sweating This list may not describe all possible side effects. Call your doctor for medical advice about side effects. You may report side effects to FDA at 1-800-FDA-1088. Where should I keep my medicine? This drug is given in a hospital or clinic and will not be stored at home. NOTE: This sheet is a summary. It may not cover all possible information. If you have questions about this medicine, talk to your doctor, pharmacist, or health care provider.  2018 Elsevier/Gold Standard (2015-04-16 19:58:00)

## 2016-11-09 NOTE — Progress Notes (Signed)
Patient Care Team: Janith Lima, MD as PCP - General (Internal Medicine) Alphonsa Overall, MD as Consulting Physician (General Surgery) Nicholas Lose, MD as Consulting Physician (Hematology and Oncology) Eppie Gibson, MD as Attending Physician (Radiation Oncology)  DIAGNOSIS:  Encounter Diagnosis  Name Primary?  . Malignant neoplasm of lower-inner quadrant of left breast in female, estrogen receptor positive ([facility])     SUMMARY OF ONCOLOGIC HISTORY:   Malignant neoplasm of lower-inner quadrant of left breast in female, estrogen receptor positive ([facility])   08/18/2016 Initial Diagnosis    Left breast biopsy 8:00 retroareolar position: IDC, high-grade with DCIS high-grade, ER 90%, PR 95%, Ki-67 20%, HER-2 positive ratio 3.4; screening detected left breast distortion LIQ 8 8:00 retroareolar 0.8 cm, axilla negative, T1b N0 stage IA clinical stage      09/22/2016 Surgery    Left lumpectomy: IDC grade 3, 1.3 cm, DCIS high-grade, margins clear, 0/1 lymph node negative, T1CN 0 stage IA, ER 90%, PR 95%, Ki-67 20%, HER-2 positive ratio 3.4      10/19/2016 -  Chemotherapy    Taxol Herceptin weekly 12 followed by Herceptin maintenance every 3 weeks for 1 year        CHIEF COMPLIANT: Cycle 4 Taxol Herceptin  INTERVAL HISTORY: Sandra Wood is a 61 year old with above-mentioned history left breast cancer treated with lumpectomy and is currently on adjuvant chemotherapy with weekly Taxol with Herceptin. She has been tolerating the treatment fairly well. She does not have any nausea vomiting. She does complain of mild fatigue. She has noticed small amount of hair loss. Denies any fevers or chills. She complains of a maculopapular rash on the hands someone the neck as well as a dry scaling lesion below the right eye  REVIEW OF SYSTEMS:   Constitutional: Denies fevers, chills or abnormal weight loss Eyes: Denies blurriness of vision Ears, nose, mouth, throat, and face: Denies mucositis or sore  throat Respiratory: Denies cough, dyspnea or wheezes Cardiovascular: Denies palpitation, chest discomfort Gastrointestinal:  Denies nausea, heartburn or change in bowel habits Skin: Maculopapular skin rash on arms chest Lymphatics: Denies new lymphadenopathy or easy bruising Neurological:Denies numbness, tingling or new weaknesses Behavioral/Psych: Mood is stable, no new changes  Extremities: No lower extremity edema Breast:  denies any pain or lumps or nodules in either breasts All other systems were reviewed with the patient and are negative.  I have reviewed the past medical history, past surgical history, social history and family history with the patient and they are unchanged from previous note.  ALLERGIES:  is allergic to no known allergies.  MEDICATIONS:  Current Outpatient Prescriptions  Medication Sig Dispense Refill  . azithromycin (ZITHROMAX Z-PAK) 250 MG tablet Use as directed on package 6 each 0  . HYDROcodone-acetaminophen (NORCO/VICODIN) 5-325 MG tablet Take 1-2 tablets by mouth every 6 (six) hours as needed. 20 tablet 0  . ibuprofen (ADVIL,MOTRIN) 200 MG tablet Take 400-600 mg by mouth every 6 (six) hours as needed for headache or mild pain (depends on pain if takes 2-3 tablets).     Marland Kitchen ipratropium-albuterol (DUONEB) 0.5-2.5 (3) MG/3ML SOLN Inhale 3 mLs into the lungs every 4 (four) hours as needed (shortness of breath).     . lidocaine-prilocaine (EMLA) cream Apply to affected area once 30 g 3  . lidocaine-prilocaine (EMLA) cream Apply to affected area once 30 g 3  . LORazepam (ATIVAN) 0.5 MG tablet Take 1 tablet (0.5 mg total) by mouth every 6 (six) hours as needed (Nausea or vomiting). [place]  tablet 0  . ondansetron (ZOFRAN) 8 MG tablet Take 1 tablet (8 mg total) by mouth 2 (two) times daily as needed (Nausea or vomiting). 30 tablet 1  . PROAIR HFA 108 (90 Base) MCG/ACT inhaler Inhale 2 puffs into the lungs every 6 (six) hours as needed for wheezing or shortness of breath.      . prochlorperazine (COMPAZINE) 10 MG tablet Take 1 tablet (10 mg total) by mouth every 6 (six) hours as needed (Nausea or vomiting). 30 tablet 1  . sertraline (ZOLOFT) 50 MG tablet Take 1 tablet (50 mg total) by mouth daily. (Patient taking differently: Take 50 mg by mouth daily as needed. ) 90 tablet 3   No current facility-administered medications for this visit.     PHYSICAL EXAMINATION: ECOG PERFORMANCE STATUS: 1 - Symptomatic but completely ambulatory  Vitals:   11/09/16 1111  BP: 137/68  Pulse: 86  Resp: 18  Temp: 98.6 F (37 C)   Filed Weights   11/09/16 1111  Weight: (!) 329 lb 3.2 oz (149.3 kg)    GENERAL:alert, no distress and comfortable SKIN: skin color, texture, turgor are normal, no rashes or significant lesions EYES: normal, Conjunctiva are pink and non-injected, sclera clear OROPHARYNX:no exudate, no erythema and lips, buccal mucosa, and tongue normal  NECK: supple, thyroid normal size, non-tender, without nodularity LYMPH:  no palpable lymphadenopathy in the cervical, axillary or inguinal LUNGS: clear to auscultation and percussion with normal breathing effort HEART: regular rate & rhythm and no murmurs and no lower extremity edema ABDOMEN:abdomen soft, non-tender and normal bowel sounds MUSCULOSKELETAL:no cyanosis of digits and no clubbing  NEURO: alert & oriented x 3 with fluent speech, no focal motor/sensory deficits EXTREMITIES: No lower extremity edema  LABORATORY DATA:  I have reviewed the data as listed   Chemistry      Component Value Date/Time   NA 142 11/09/2016 1001   K 3.7 11/09/2016 1001   CL 103 09/15/2016 1255   CO2 25 11/09/2016 1001   BUN 12.5 11/09/2016 1001   CREATININE 0.7 11/09/2016 1001      Component Value Date/Time   CALCIUM 9.1 11/09/2016 1001   ALKPHOS 133 11/09/2016 1001   AST 14 11/09/2016 1001   ALT 26 11/09/2016 1001   BILITOT 0.43 11/09/2016 1001       Lab Results  Component Value Date   WBC 6.6 11/09/2016     HGB 13.2 11/09/2016   HCT 40.2 11/09/2016   MCV 83.7 11/09/2016   PLT 403 (H) 11/09/2016   NEUTROABS 4.1 11/09/2016    ASSESSMENT & PLAN:  Malignant neoplasm of lower-inner quadrant of left breast in female, estrogen receptor positive ([facility]) Left breast biopsy02/20/2018:8:00 retroareolar position: IDC, high-grade with DCIS high-grade, ER 90%, PR 95%, Ki-67 20%, HER-2 positive ratio 3.4; screening detected left breast distortion LIQ 8 8:00 retroareolar 0.8 cm, axilla negative, T1b N0 stage IA clinical stage Left lumpectomy 09/22/2016: IDC grade 3, 1.3 cm, DCIS high-grade, margins clear, 0/1 lymph node negative, T1 CN 0 stage IA, ER 90%, PR 95%, Ki-67 20%, HER-2 positive ratio 3.4  Recommendation: 1. Adjuvant chemotherapy and Herceptin with weekly Taxol and Herceptin 12 followed by Herceptin maintenance for 1 year 3. Followed by adjuvant radiation 4. Followed by adjuvant antiestrogen therapy with letrozole 2.5 mg daily 5 years ----------------------------------------------------------------------------- Current Treatment: Taxol Herceptin Cycle 4 Chemotherapy toxicities:  1. Bone and muscle pain 2. Fatigue 3. Maculopapular skin rash in the chest arms and face: I discussed with her that Abraxane can  cause these rashes. Instructed her to apply hydrocortisone cream. We will keep a close watch on this.   Diabetes with elevated blood sugars: We are carefully monitoring them. Labs reviewed Monitoring closely for chemotherapy toxicities  RTC in 2 week for tox check and for cycle 6.   I spent 25 minutes talking to the patient of which more than half was spent in counseling and coordination of care.  No orders of the defined types were placed in this encounter.  The patient has a good understanding of the overall plan. she agrees with it. she will call with any problems that may develop before the next visit here.   Rulon Eisenmenger, MD 11/09/16

## 2016-11-09 NOTE — Patient Instructions (Signed)

## 2016-11-16 ENCOUNTER — Other Ambulatory Visit (HOSPITAL_BASED_OUTPATIENT_CLINIC_OR_DEPARTMENT_OTHER): Payer: No Typology Code available for payment source

## 2016-11-16 ENCOUNTER — Ambulatory Visit: Payer: No Typology Code available for payment source

## 2016-11-16 ENCOUNTER — Ambulatory Visit (HOSPITAL_BASED_OUTPATIENT_CLINIC_OR_DEPARTMENT_OTHER): Payer: No Typology Code available for payment source

## 2016-11-16 VITALS — BP 144/64 | HR 85 | Temp 98.5°F | Resp 18

## 2016-11-16 DIAGNOSIS — Z5111 Encounter for antineoplastic chemotherapy: Secondary | ICD-10-CM

## 2016-11-16 DIAGNOSIS — C50312 Malignant neoplasm of lower-inner quadrant of left female breast: Secondary | ICD-10-CM

## 2016-11-16 DIAGNOSIS — Z170 Estrogen receptor positive status [ER+]: Principal | ICD-10-CM

## 2016-11-16 DIAGNOSIS — Z95828 Presence of other vascular implants and grafts: Secondary | ICD-10-CM

## 2016-11-16 LAB — COMPREHENSIVE METABOLIC PANEL
ALBUMIN: 3.1 g/dL — AB (ref 3.5–5.0)
ALK PHOS: 124 U/L (ref 40–150)
ALT: 22 U/L (ref 0–55)
ANION GAP: 11 meq/L (ref 3–11)
AST: 10 U/L (ref 5–34)
BILIRUBIN TOTAL: 0.48 mg/dL (ref 0.20–1.20)
BUN: 11.2 mg/dL (ref 7.0–26.0)
CALCIUM: 8.9 mg/dL (ref 8.4–10.4)
CO2: 25 mEq/L (ref 22–29)
Chloride: 108 mEq/L (ref 98–109)
Creatinine: 0.7 mg/dL (ref 0.6–1.1)
EGFR: 90 mL/min/{1.73_m2} — AB (ref >90)
GLUCOSE: 190 mg/dL — AB (ref 70–140)
POTASSIUM: 3.9 meq/L (ref 3.5–5.1)
SODIUM: 145 meq/L (ref 136–145)
TOTAL PROTEIN: 6.8 g/dL (ref 6.4–8.3)

## 2016-11-16 LAB — CBC WITH DIFFERENTIAL/PLATELET
BASO%: 1 % (ref 0.0–2.0)
BASOS ABS: 0.1 10*3/uL (ref 0.0–0.1)
EOS%: 1.7 % (ref 0.0–7.0)
Eosinophils Absolute: 0.1 10*3/uL (ref 0.0–0.5)
HEMATOCRIT: 40 % (ref 34.8–46.6)
HEMOGLOBIN: 13.2 g/dL (ref 11.6–15.9)
LYMPH#: 2.1 10*3/uL (ref 0.9–3.3)
LYMPH%: 26 % (ref 14.0–49.7)
MCH: 27.8 pg (ref 25.1–34.0)
MCHC: 33 g/dL (ref 31.5–36.0)
MCV: 84.3 fL (ref 79.5–101.0)
MONO#: 0.6 10*3/uL (ref 0.1–0.9)
MONO%: 7.3 % (ref 0.0–14.0)
NEUT#: 5.3 10*3/uL (ref 1.5–6.5)
NEUT%: 64 % (ref 38.4–76.8)
Platelets: 368 10*3/uL (ref 145–400)
RBC: 4.74 10*6/uL (ref 3.70–5.45)
RDW: 15.9 % — AB (ref 11.2–14.5)
WBC: 8.3 10*3/uL (ref 3.9–10.3)

## 2016-11-16 LAB — TECHNOLOGIST REVIEW

## 2016-11-16 MED ORDER — ACETAMINOPHEN 325 MG PO TABS
650.0000 mg | ORAL_TABLET | Freq: Once | ORAL | Status: AC
  Administered 2016-11-16: 650 mg via ORAL

## 2016-11-16 MED ORDER — ACETAMINOPHEN 325 MG PO TABS
ORAL_TABLET | ORAL | Status: AC
  Filled 2016-11-16: qty 2

## 2016-11-16 MED ORDER — HEPARIN SOD (PORK) LOCK FLUSH 100 UNIT/ML IV SOLN
500.0000 [IU] | Freq: Once | INTRAVENOUS | Status: AC | PRN
  Administered 2016-11-16: 500 [IU]
  Filled 2016-11-16: qty 5

## 2016-11-16 MED ORDER — ONDANSETRON HCL 4 MG/2ML IJ SOLN
INTRAMUSCULAR | Status: AC
  Filled 2016-11-16: qty 4

## 2016-11-16 MED ORDER — PACLITAXEL CHEMO INJECTION 300 MG/50ML
80.0000 mg/m2 | Freq: Once | INTRAVENOUS | Status: AC
  Administered 2016-11-16: 204 mg via INTRAVENOUS
  Filled 2016-11-16: qty 34

## 2016-11-16 MED ORDER — SODIUM CHLORIDE 0.9 % IV SOLN
2.0000 mg/kg | Freq: Once | INTRAVENOUS | Status: AC
  Administered 2016-11-16: 294 mg via INTRAVENOUS
  Filled 2016-11-16: qty 14

## 2016-11-16 MED ORDER — FAMOTIDINE IN NACL 20-0.9 MG/50ML-% IV SOLN
20.0000 mg | Freq: Once | INTRAVENOUS | Status: AC
  Administered 2016-11-16: 20 mg via INTRAVENOUS

## 2016-11-16 MED ORDER — DIPHENHYDRAMINE HCL 50 MG/ML IJ SOLN
50.0000 mg | Freq: Once | INTRAMUSCULAR | Status: AC
  Administered 2016-11-16: 50 mg via INTRAVENOUS

## 2016-11-16 MED ORDER — SODIUM CHLORIDE 0.9% FLUSH
10.0000 mL | INTRAVENOUS | Status: DC | PRN
  Administered 2016-11-16: 10 mL
  Filled 2016-11-16: qty 10

## 2016-11-16 MED ORDER — SODIUM CHLORIDE 0.9% FLUSH
10.0000 mL | INTRAVENOUS | Status: DC | PRN
  Administered 2016-11-16: 10 mL via INTRAVENOUS
  Filled 2016-11-16: qty 10

## 2016-11-16 MED ORDER — DIPHENHYDRAMINE HCL 25 MG PO CAPS: 50.0000 mg | ORAL_CAPSULE | Freq: Once | ORAL | Status: DC

## 2016-11-16 MED ORDER — DEXAMETHASONE SODIUM PHOSPHATE 10 MG/ML IJ SOLN
10.0000 mg | Freq: Once | INTRAMUSCULAR | Status: AC
  Administered 2016-11-16: 10 mg via INTRAVENOUS

## 2016-11-16 MED ORDER — SODIUM CHLORIDE 0.9 % IV SOLN
Freq: Once | INTRAVENOUS | Status: AC
  Administered 2016-11-16: 11:00:00 via INTRAVENOUS

## 2016-11-16 MED ORDER — DEXAMETHASONE SODIUM PHOSPHATE 10 MG/ML IJ SOLN
INTRAMUSCULAR | Status: AC
  Filled 2016-11-16: qty 1

## 2016-11-16 MED ORDER — ONDANSETRON HCL 4 MG/2ML IJ SOLN
8.0000 mg | Freq: Once | INTRAMUSCULAR | Status: AC
  Administered 2016-11-16: 8 mg via INTRAVENOUS

## 2016-11-16 MED ORDER — DIPHENHYDRAMINE HCL 50 MG/ML IJ SOLN
INTRAMUSCULAR | Status: AC
  Filled 2016-11-16: qty 1

## 2016-11-16 MED ORDER — FAMOTIDINE IN NACL 20-0.9 MG/50ML-% IV SOLN
INTRAVENOUS | Status: AC
  Filled 2016-11-16: qty 50

## 2016-11-16 NOTE — Patient Instructions (Signed)
[facility] Discharge Instructions for Patients Receiving Chemotherapy  Today you received the following chemotherapy agents Herceptin/Taxol To help prevent nausea and vomiting after your treatment, we encourage you to take your nausea medication as prescribed.  If you develop nausea and vomiting that is not controlled by your nausea medication, call the clinic.   BELOW ARE SYMPTOMS THAT SHOULD BE REPORTED IMMEDIATELY:  *FEVER GREATER THAN 100.5 F  *CHILLS WITH OR WITHOUT FEVER  NAUSEA AND VOMITING THAT IS NOT CONTROLLED WITH YOUR NAUSEA MEDICATION  *UNUSUAL SHORTNESS OF BREATH  *UNUSUAL BRUISING OR BLEEDING  TENDERNESS IN MOUTH AND THROAT WITH OR WITHOUT PRESENCE OF ULCERS  *URINARY PROBLEMS  *BOWEL PROBLEMS  UNUSUAL RASH Items with * indicate a potential emergency and should be followed up as soon as possible.  Feel free to call the clinic you have any questions or concerns. The clinic phone number is [phone].  Please show the [place] at check-in to the Emergency Department and triage nurse.

## 2016-11-23 NOTE — Assessment & Plan Note (Signed)
Left breast biopsy02/20/2018:8:00 retroareolar position: IDC, high-grade with DCIS high-grade, ER 90%, PR 95%, Ki-67 20%, HER-2 positive ratio 3.4; screening detected left breast distortion LIQ 8 8:00 retroareolar 0.8 cm, axilla negative, T1b N0 stage IA clinical stage Left lumpectomy 09/22/2016: IDC grade 3, 1.3 cm, DCIS high-grade, margins clear, 0/1 lymph node negative, T1 CN 0 stage IA, ER 90%, PR 95%, Ki-67 20%, HER-2 positive ratio 3.4  Recommendation: 1. Adjuvant chemotherapy and Herceptin with weekly Taxol and Herceptin 12 followed by Herceptin maintenance for 1 year 3. Followed by adjuvant radiation 4. Followed by adjuvant antiestrogen therapy with letrozole 2.5 mg daily 5 years ----------------------------------------------------------------------------- Current Treatment: Taxol Herceptin Cycle 8 Chemotherapy toxicities:  1. Bone and muscle pain 2. Fatigue 3. Maculopapular skin rash in the chest arms and face: I discussed with her that Abraxane can cause these rashes. Instructed her to apply hydrocortisone cream. We will keep a close watch on this.   Diabetes with elevated blood sugars: We are carefully monitoring them. Labs reviewed Monitoring closely for chemotherapy toxicities  RTC in 2week for tox check and for cycle 8.

## 2016-11-24 ENCOUNTER — Other Ambulatory Visit (HOSPITAL_BASED_OUTPATIENT_CLINIC_OR_DEPARTMENT_OTHER): Payer: No Typology Code available for payment source

## 2016-11-24 ENCOUNTER — Encounter: Payer: Self-pay | Admitting: *Deleted

## 2016-11-24 ENCOUNTER — Ambulatory Visit (HOSPITAL_BASED_OUTPATIENT_CLINIC_OR_DEPARTMENT_OTHER): Payer: No Typology Code available for payment source

## 2016-11-24 ENCOUNTER — Encounter: Payer: Self-pay | Admitting: Hematology and Oncology

## 2016-11-24 ENCOUNTER — Ambulatory Visit (HOSPITAL_BASED_OUTPATIENT_CLINIC_OR_DEPARTMENT_OTHER): Payer: No Typology Code available for payment source | Admitting: Hematology and Oncology

## 2016-11-24 ENCOUNTER — Ambulatory Visit: Payer: No Typology Code available for payment source

## 2016-11-24 DIAGNOSIS — E119 Type 2 diabetes mellitus without complications: Secondary | ICD-10-CM

## 2016-11-24 DIAGNOSIS — Z170 Estrogen receptor positive status [ER+]: Principal | ICD-10-CM

## 2016-11-24 DIAGNOSIS — C50312 Malignant neoplasm of lower-inner quadrant of left female breast: Secondary | ICD-10-CM

## 2016-11-24 DIAGNOSIS — Z5111 Encounter for antineoplastic chemotherapy: Secondary | ICD-10-CM

## 2016-11-24 DIAGNOSIS — Z5112 Encounter for antineoplastic immunotherapy: Secondary | ICD-10-CM | POA: Diagnosis not present

## 2016-11-24 DIAGNOSIS — Z95828 Presence of other vascular implants and grafts: Secondary | ICD-10-CM

## 2016-11-24 LAB — COMPREHENSIVE METABOLIC PANEL
ALT: 22 U/L (ref 0–55)
ANION GAP: 11 meq/L (ref 3–11)
AST: 13 U/L (ref 5–34)
Albumin: 3.2 g/dL — ABNORMAL LOW (ref 3.5–5.0)
Alkaline Phosphatase: 129 U/L (ref 40–150)
BUN: 13.2 mg/dL (ref 7.0–26.0)
CALCIUM: 9.2 mg/dL (ref 8.4–10.4)
CHLORIDE: 108 meq/L (ref 98–109)
CO2: 24 meq/L (ref 22–29)
Creatinine: 0.8 mg/dL (ref 0.6–1.1)
EGFR: 85 mL/min/{1.73_m2} — AB (ref >90)
Glucose: 162 mg/dl — ABNORMAL HIGH (ref 70–140)
POTASSIUM: 3.6 meq/L (ref 3.5–5.1)
Sodium: 143 mEq/L (ref 136–145)
Total Bilirubin: 0.52 mg/dL (ref 0.20–1.20)
Total Protein: 7 g/dL (ref 6.4–8.3)

## 2016-11-24 LAB — CBC WITH DIFFERENTIAL/PLATELET
BASO%: 0.8 % (ref 0.0–2.0)
BASOS ABS: 0.1 10*3/uL (ref 0.0–0.1)
EOS%: 1.4 % (ref 0.0–7.0)
Eosinophils Absolute: 0.1 10*3/uL (ref 0.0–0.5)
HEMATOCRIT: 39.6 % (ref 34.8–46.6)
HGB: 13 g/dL (ref 11.6–15.9)
LYMPH%: 23.4 % (ref 14.0–49.7)
MCH: 27.6 pg (ref 25.1–34.0)
MCHC: 32.8 g/dL (ref 31.5–36.0)
MCV: 84.2 fL (ref 79.5–101.0)
MONO#: 0.7 10*3/uL (ref 0.1–0.9)
MONO%: 8.9 % (ref 0.0–14.0)
NEUT#: 5.2 10*3/uL (ref 1.5–6.5)
NEUT%: 65.5 % (ref 38.4–76.8)
PLATELETS: 401 10*3/uL — AB (ref 145–400)
RBC: 4.71 10*6/uL (ref 3.70–5.45)
RDW: 17 % — ABNORMAL HIGH (ref 11.2–14.5)
WBC: 7.9 10*3/uL (ref 3.9–10.3)
lymph#: 1.8 10*3/uL (ref 0.9–3.3)

## 2016-11-24 MED ORDER — HEPARIN SOD (PORK) LOCK FLUSH 100 UNIT/ML IV SOLN
500.0000 [IU] | Freq: Once | INTRAVENOUS | Status: AC | PRN
  Administered 2016-11-24: 500 [IU]
  Filled 2016-11-24: qty 5

## 2016-11-24 MED ORDER — ONDANSETRON HCL 4 MG/2ML IJ SOLN
INTRAMUSCULAR | Status: AC
  Filled 2016-11-24: qty 4

## 2016-11-24 MED ORDER — DIPHENHYDRAMINE HCL 50 MG/ML IJ SOLN
INTRAMUSCULAR | Status: AC
  Filled 2016-11-24: qty 1

## 2016-11-24 MED ORDER — TRASTUZUMAB CHEMO 150 MG IV SOLR
300.0000 mg | Freq: Once | INTRAVENOUS | Status: AC
  Administered 2016-11-24: 300 mg via INTRAVENOUS
  Filled 2016-11-24: qty 14.29

## 2016-11-24 MED ORDER — DEXAMETHASONE SODIUM PHOSPHATE 10 MG/ML IJ SOLN
10.0000 mg | Freq: Once | INTRAMUSCULAR | Status: AC
  Administered 2016-11-24: 10 mg via INTRAVENOUS

## 2016-11-24 MED ORDER — FAMOTIDINE IN NACL 20-0.9 MG/50ML-% IV SOLN
20.0000 mg | Freq: Once | INTRAVENOUS | Status: AC
  Administered 2016-11-24: 20 mg via INTRAVENOUS

## 2016-11-24 MED ORDER — FAMOTIDINE IN NACL 20-0.9 MG/50ML-% IV SOLN
INTRAVENOUS | Status: AC
  Filled 2016-11-24: qty 50

## 2016-11-24 MED ORDER — DEXAMETHASONE SODIUM PHOSPHATE 10 MG/ML IJ SOLN
INTRAMUSCULAR | Status: AC
  Filled 2016-11-24: qty 1

## 2016-11-24 MED ORDER — PACLITAXEL CHEMO INJECTION 300 MG/50ML
80.0000 mg/m2 | Freq: Once | INTRAVENOUS | Status: AC
  Administered 2016-11-24: 204 mg via INTRAVENOUS
  Filled 2016-11-24: qty 34

## 2016-11-24 MED ORDER — DIPHENHYDRAMINE HCL 50 MG/ML IJ SOLN
50.0000 mg | Freq: Once | INTRAMUSCULAR | Status: AC
  Administered 2016-11-24: 50 mg via INTRAVENOUS

## 2016-11-24 MED ORDER — SODIUM CHLORIDE 0.9% FLUSH
10.0000 mL | INTRAVENOUS | Status: DC | PRN
  Administered 2016-11-24: 10 mL
  Filled 2016-11-24: qty 10

## 2016-11-24 MED ORDER — SODIUM CHLORIDE 0.9 % IV SOLN
Freq: Once | INTRAVENOUS | Status: AC
  Administered 2016-11-24: 13:00:00 via INTRAVENOUS

## 2016-11-24 MED ORDER — SODIUM CHLORIDE 0.9% FLUSH
10.0000 mL | INTRAVENOUS | Status: DC | PRN
  Administered 2016-11-24: 10 mL via INTRAVENOUS
  Filled 2016-11-24: qty 10

## 2016-11-24 MED ORDER — DIPHENHYDRAMINE HCL 25 MG PO CAPS: 50.0000 mg | ORAL_CAPSULE | Freq: Once | ORAL | Status: DC

## 2016-11-24 MED ORDER — ONDANSETRON HCL 4 MG/2ML IJ SOLN
8.0000 mg | Freq: Once | INTRAMUSCULAR | Status: AC
  Administered 2016-11-24: 8 mg via INTRAVENOUS

## 2016-11-24 MED ORDER — ACETAMINOPHEN 325 MG PO TABS
ORAL_TABLET | ORAL | Status: AC
  Filled 2016-11-24: qty 2

## 2016-11-24 MED ORDER — ACETAMINOPHEN 325 MG PO TABS
650.0000 mg | ORAL_TABLET | Freq: Once | ORAL | Status: AC
  Administered 2016-11-24: 650 mg via ORAL

## 2016-11-24 NOTE — Patient Instructions (Signed)
[facility] Discharge Instructions for Patients Receiving Chemotherapy  Today you received the following chemotherapy agents Herceptin/Taxol To help prevent nausea and vomiting after your treatment, we encourage you to take your nausea medication as prescribed.  If you develop nausea and vomiting that is not controlled by your nausea medication, call the clinic.   BELOW ARE SYMPTOMS THAT SHOULD BE REPORTED IMMEDIATELY:  *FEVER GREATER THAN 100.5 F  *CHILLS WITH OR WITHOUT FEVER  NAUSEA AND VOMITING THAT IS NOT CONTROLLED WITH YOUR NAUSEA MEDICATION  *UNUSUAL SHORTNESS OF BREATH  *UNUSUAL BRUISING OR BLEEDING  TENDERNESS IN MOUTH AND THROAT WITH OR WITHOUT PRESENCE OF ULCERS  *URINARY PROBLEMS  *BOWEL PROBLEMS  UNUSUAL RASH Items with * indicate a potential emergency and should be followed up as soon as possible.  Feel free to call the clinic you have any questions or concerns. The clinic phone number is [phone].  Please show the [place] at check-in to the Emergency Department and triage nurse.

## 2016-11-24 NOTE — Progress Notes (Signed)
Patient Care Team: Janith Lima, MD as PCP - General (Internal Medicine) Alphonsa Overall, MD as Consulting Physician (General Surgery) Nicholas Lose, MD as Consulting Physician (Hematology and Oncology) Eppie Gibson, MD as Attending Physician (Radiation Oncology)  DIAGNOSIS:  Encounter Diagnosis  Name Primary?  . Malignant neoplasm of lower-inner quadrant of left breast in female, estrogen receptor positive ([place])     SUMMARY OF ONCOLOGIC HISTORY:   Malignant neoplasm of lower-inner quadrant of left breast in female, estrogen receptor positive ([place])   08/18/2016 Initial Diagnosis    Left breast biopsy 8:00 retroareolar position: IDC, high-grade with DCIS high-grade, ER 90%, PR 95%, Ki-67 20%, HER-2 positive ratio 3.4; screening detected left breast distortion LIQ 8 8:00 retroareolar 0.8 cm, axilla negative, T1b N0 stage IA clinical stage      09/22/2016 Surgery    Left lumpectomy: IDC grade 3, 1.3 cm, DCIS high-grade, margins clear, 0/1 lymph node negative, T1CN 0 stage IA, ER 90%, PR 95%, Ki-67 20%, HER-2 positive ratio 3.4      10/19/2016 -  Chemotherapy    Taxol Herceptin weekly 12 followed by Herceptin maintenance every 3 weeks for 1 year        CHIEF COMPLIANT: Cycle 6 Taxol Herceptin  INTERVAL HISTORY: MALAY FANTROY is a 61 year old with above-mentioned history of left breast cancer currently on adjuvant chemotherapy with Taxol Herceptin weekly. Today is cycle 6 of treatment. Overall the skin rashes. Is stable. Denies any nausea vomiting. She has had mild neuropathy tips of the fingers. She does not report any  neuropathy. Denies any nausea vomiting.  REVIEW OF SYSTEMS:   Constitutional: Denies fevers, chills or abnormal weight loss Eyes: Denies blurriness of vision Ears, nose, mouth, throat, and face: Denies mucositis or sore throat Respiratory: Denies cough, dyspnea or wheezes Cardiovascular: Denies palpitation, chest discomfort Gastrointestinal:  Denies  nausea, heartburn or change in bowel habits Skin: Maculopapular skin rash on the arms and the neck Lymphatics: Denies new lymphadenopathy or easy bruising Neurological:Denies numbness, tingling or new weaknesses Behavioral/Psych: Mood is stable, no new changes  Extremities: No lower extremity edema Breast:  denies any pain or lumps or nodules in either breasts All other systems were reviewed with the patient and are negative.  I have reviewed the past medical history, past surgical history, social history and family history with the patient and they are unchanged from previous note.  ALLERGIES:  is allergic to no known allergies.  MEDICATIONS:  Current Outpatient Prescriptions  Medication Sig Dispense Refill  . azithromycin (ZITHROMAX Z-PAK) 250 MG tablet Use as directed on package 6 each 0  . HYDROcodone-acetaminophen (NORCO/VICODIN) 5-325 MG tablet Take 1-2 tablets by mouth every 6 (six) hours as needed. 20 tablet 0  . ibuprofen (ADVIL,MOTRIN) 200 MG tablet Take 400-600 mg by mouth every 6 (six) hours as needed for headache or mild pain (depends on pain if takes 2-3 tablets).     Marland Kitchen ipratropium-albuterol (DUONEB) 0.5-2.5 (3) MG/3ML SOLN Inhale 3 mLs into the lungs every 4 (four) hours as needed (shortness of breath).     . lidocaine-prilocaine (EMLA) cream Apply to affected area once 30 g 3  . lidocaine-prilocaine (EMLA) cream Apply to affected area once 30 g 3  . LORazepam (ATIVAN) 0.5 MG tablet Take 1 tablet (0.5 mg total) by mouth every 6 (six) hours as needed (Nausea or vomiting). 30 tablet 0  . ondansetron (ZOFRAN) 8 MG tablet Take 1 tablet (8 mg total) by mouth 2 (two) times daily as needed (  Nausea or vomiting). 30 tablet 1  . PROAIR HFA 108 (90 Base) MCG/ACT inhaler Inhale 2 puffs into the lungs every 6 (six) hours as needed for wheezing or shortness of breath.     . prochlorperazine (COMPAZINE) 10 MG tablet Take 1 tablet (10 mg total) by mouth every 6 (six) hours as needed (Nausea  or vomiting). 30 tablet 1  . sertraline (ZOLOFT) 50 MG tablet Take 1 tablet (50 mg total) by mouth daily. (Patient taking differently: Take 50 mg by mouth daily as needed. ) 90 tablet 3   No current facility-administered medications for this visit.     PHYSICAL EXAMINATION: ECOG PERFORMANCE STATUS: 1 - Symptomatic but completely ambulatory  Vitals:   11/24/16 1126  BP: (!) 156/71  Pulse: 85  Resp: 19  Temp: 98.2 F (36.8 C)   Filed Weights   11/24/16 1126  Weight: (!) 331 lb 12.8 oz (150.5 kg)    GENERAL:alert, no distress and comfortable SKIN: skin color, texture, turgor are normal, no rashes or significant lesions EYES: normal, Conjunctiva are pink and non-injected, sclera clear OROPHARYNX:no exudate, no erythema and lips, buccal mucosa, and tongue normal  NECK: supple, thyroid normal size, non-tender, without nodularity LYMPH:  no palpable lymphadenopathy in the cervical, axillary or inguinal LUNGS: clear to auscultation and percussion with normal breathing effort HEART: regular rate & rhythm and no murmurs and no lower extremity edema ABDOMEN:abdomen soft, non-tender and normal bowel sounds MUSCULOSKELETAL:no cyanosis of digits and no clubbing  NEURO: alert & oriented x 3 with fluent speech, no focal motor/sensory deficits EXTREMITIES: No lower extremity edema BREAST: No palpable masses or nodules in either right or left breasts. No palpable axillary supraclavicular or infraclavicular adenopathy no breast tenderness or nipple discharge. (exam performed in the presence of a chaperone)  LABORATORY DATA:  I have reviewed the data as listed   Chemistry      Component Value Date/Time   NA 143 11/24/2016 1012   K 3.6 11/24/2016 1012   CL 103 09/15/2016 1255   CO2 24 11/24/2016 1012   BUN 13.2 11/24/2016 1012   CREATININE 0.8 11/24/2016 1012      Component Value Date/Time   CALCIUM 9.2 11/24/2016 1012   ALKPHOS 129 11/24/2016 1012   AST 13 11/24/2016 1012   ALT 22  11/24/2016 1012   BILITOT 0.52 11/24/2016 1012       Lab Results  Component Value Date   WBC 7.9 11/24/2016   HGB 13.0 11/24/2016   HCT 39.6 11/24/2016   MCV 84.2 11/24/2016   PLT 401 (H) 11/24/2016   NEUTROABS 5.2 11/24/2016    ASSESSMENT & PLAN:  Malignant neoplasm of lower-inner quadrant of left breast in female, estrogen receptor positive ([facility]) Left breast biopsy02/20/2018:8:00 retroareolar position: IDC, high-grade with DCIS high-grade, ER 90%, PR 95%, Ki-67 20%, HER-2 positive ratio 3.4; screening detected left breast distortion LIQ 8 8:00 retroareolar 0.8 cm, axilla negative, T1b N0 stage IA clinical stage Left lumpectomy 09/22/2016: IDC grade 3, 1.3 cm, DCIS high-grade, margins clear, 0/1 lymph node negative, T1 CN 0 stage IA, ER 90%, PR 95%, Ki-67 20%, HER-2 positive ratio 3.4  Recommendation: 1. Adjuvant chemotherapy and Herceptin with weekly Taxol and Herceptin 12 followed by Herceptin maintenance for 1 year 3. Followed by adjuvant radiation 4. Followed by adjuvant antiestrogen therapy with letrozole 2.5 mg daily 5 years ----------------------------------------------------------------------------- Current Treatment: Taxol Herceptin Cycle 8 Chemotherapy toxicities:  1. Bone and muscle pain 2. Fatigue 3. Maculopapular skin rash in the chest  arms and face: She is using hydrocortisone cream. We will keep a close watch on this.   Diabetes with elevated blood sugars: We are carefully monitoring them. Labs reviewed Monitoring closely for chemotherapy toxicities  RTC in 2week for tox check and for cycle 8.   I spent 25 minutes talking to the patient of which more than half was spent in counseling and coordination of care.  No orders of the defined types were placed in this encounter.  The patient has a good understanding of the overall plan. she agrees with it. she will call with any problems that may develop before the next visit here.   Rulon Eisenmenger,  MD 11/24/16

## 2016-11-24 NOTE — Progress Notes (Signed)
QI encounter 

## 2016-11-30 ENCOUNTER — Ambulatory Visit (HOSPITAL_BASED_OUTPATIENT_CLINIC_OR_DEPARTMENT_OTHER): Payer: No Typology Code available for payment source

## 2016-11-30 ENCOUNTER — Other Ambulatory Visit (HOSPITAL_BASED_OUTPATIENT_CLINIC_OR_DEPARTMENT_OTHER): Payer: No Typology Code available for payment source

## 2016-11-30 VITALS — BP 140/67 | HR 82 | Temp 98.4°F | Resp 18

## 2016-11-30 DIAGNOSIS — Z5111 Encounter for antineoplastic chemotherapy: Secondary | ICD-10-CM

## 2016-11-30 DIAGNOSIS — Z170 Estrogen receptor positive status [ER+]: Principal | ICD-10-CM

## 2016-11-30 DIAGNOSIS — Z5112 Encounter for antineoplastic immunotherapy: Secondary | ICD-10-CM

## 2016-11-30 DIAGNOSIS — Z452 Encounter for adjustment and management of vascular access device: Secondary | ICD-10-CM

## 2016-11-30 DIAGNOSIS — C50312 Malignant neoplasm of lower-inner quadrant of left female breast: Secondary | ICD-10-CM

## 2016-11-30 DIAGNOSIS — Z95828 Presence of other vascular implants and grafts: Secondary | ICD-10-CM

## 2016-11-30 LAB — CBC WITH DIFFERENTIAL/PLATELET
BASO%: 0.7 % (ref 0.0–2.0)
Basophils Absolute: 0 10*3/uL (ref 0.0–0.1)
EOS%: 1.7 % (ref 0.0–7.0)
Eosinophils Absolute: 0.1 10*3/uL (ref 0.0–0.5)
HCT: 41.2 % (ref 34.8–46.6)
HGB: 13.1 g/dL (ref 11.6–15.9)
LYMPH%: 31.7 % (ref 14.0–49.7)
MCH: 27.8 pg (ref 25.1–34.0)
MCHC: 31.8 g/dL (ref 31.5–36.0)
MCV: 87.3 fL (ref 79.5–101.0)
MONO#: 0.5 10*3/uL (ref 0.1–0.9)
MONO%: 8.6 % (ref 0.0–14.0)
NEUT#: 3.5 10*3/uL (ref 1.5–6.5)
NEUT%: 57.3 % (ref 38.4–76.8)
Platelets: 312 10*3/uL (ref 145–400)
RBC: 4.72 10*6/uL (ref 3.70–5.45)
RDW: 16.6 % — ABNORMAL HIGH (ref 11.2–14.5)
WBC: 6.1 10*3/uL (ref 3.9–10.3)
lymph#: 1.9 10*3/uL (ref 0.9–3.3)

## 2016-11-30 LAB — COMPREHENSIVE METABOLIC PANEL
ALT: 24 U/L (ref 0–55)
AST: 12 U/L (ref 5–34)
Albumin: 3.3 g/dL — ABNORMAL LOW (ref 3.5–5.0)
Alkaline Phosphatase: 127 U/L (ref 40–150)
Anion Gap: 10 mEq/L (ref 3–11)
BUN: 12.6 mg/dL (ref 7.0–26.0)
CHLORIDE: 108 meq/L (ref 98–109)
CO2: 23 meq/L (ref 22–29)
CREATININE: 0.8 mg/dL (ref 0.6–1.1)
Calcium: 9.1 mg/dL (ref 8.4–10.4)
EGFR: 81 mL/min/{1.73_m2} — ABNORMAL LOW (ref >90)
Glucose: 208 mg/dl — ABNORMAL HIGH (ref 70–140)
Potassium: 4.2 mEq/L (ref 3.5–5.1)
Sodium: 140 mEq/L (ref 136–145)
Total Bilirubin: 0.47 mg/dL (ref 0.20–1.20)
Total Protein: 6.9 g/dL (ref 6.4–8.3)

## 2016-11-30 MED ORDER — DEXAMETHASONE SODIUM PHOSPHATE 10 MG/ML IJ SOLN
INTRAMUSCULAR | Status: AC
  Filled 2016-11-30: qty 1

## 2016-11-30 MED ORDER — ONDANSETRON HCL 4 MG/2ML IJ SOLN
8.0000 mg | Freq: Once | INTRAMUSCULAR | Status: AC
  Administered 2016-11-30: 8 mg via INTRAVENOUS

## 2016-11-30 MED ORDER — SODIUM CHLORIDE 0.9% FLUSH
10.0000 mL | INTRAVENOUS | Status: DC | PRN
  Administered 2016-11-30: 10 mL
  Filled 2016-11-30: qty 10

## 2016-11-30 MED ORDER — SODIUM CHLORIDE 0.9 % IV SOLN
Freq: Once | INTRAVENOUS | Status: AC
  Administered 2016-11-30: 11:00:00 via INTRAVENOUS

## 2016-11-30 MED ORDER — ACETAMINOPHEN 325 MG PO TABS
ORAL_TABLET | ORAL | Status: AC
  Filled 2016-11-30: qty 2

## 2016-11-30 MED ORDER — ALTEPLASE 2 MG IJ SOLR
2.0000 mg | Freq: Once | INTRAMUSCULAR | Status: AC | PRN
  Administered 2016-11-30: 2 mg
  Filled 2016-11-30: qty 2

## 2016-11-30 MED ORDER — HEPARIN SOD (PORK) LOCK FLUSH 100 UNIT/ML IV SOLN
500.0000 [IU] | Freq: Once | INTRAVENOUS | Status: AC | PRN
  Administered 2016-11-30: 500 [IU]
  Filled 2016-11-30: qty 5

## 2016-11-30 MED ORDER — PACLITAXEL CHEMO INJECTION 300 MG/50ML
80.0000 mg/m2 | Freq: Once | INTRAVENOUS | Status: AC
  Administered 2016-11-30: 204 mg via INTRAVENOUS
  Filled 2016-11-30: qty 34

## 2016-11-30 MED ORDER — DIPHENHYDRAMINE HCL 50 MG/ML IJ SOLN
INTRAMUSCULAR | Status: AC
Start: 2016-11-30 — End: 2016-11-30
  Filled 2016-11-30: qty 1

## 2016-11-30 MED ORDER — ONDANSETRON HCL 4 MG/2ML IJ SOLN
INTRAMUSCULAR | Status: AC
  Filled 2016-11-30: qty 4

## 2016-11-30 MED ORDER — DIPHENHYDRAMINE HCL 25 MG PO CAPS: 50.0000 mg | ORAL_CAPSULE | Freq: Once | ORAL | Status: AC

## 2016-11-30 MED ORDER — DEXAMETHASONE SODIUM PHOSPHATE 10 MG/ML IJ SOLN
10.0000 mg | Freq: Once | INTRAMUSCULAR | Status: AC
  Administered 2016-11-30: 10 mg via INTRAVENOUS

## 2016-11-30 MED ORDER — TRASTUZUMAB CHEMO 150 MG IV SOLR
300.0000 mg | Freq: Once | INTRAVENOUS | Status: AC
  Administered 2016-11-30: 300 mg via INTRAVENOUS
  Filled 2016-11-30: qty 14.29

## 2016-11-30 MED ORDER — FAMOTIDINE IN NACL 20-0.9 MG/50ML-% IV SOLN
20.0000 mg | Freq: Once | INTRAVENOUS | Status: AC
  Administered 2016-11-30: 20 mg via INTRAVENOUS

## 2016-11-30 MED ORDER — FAMOTIDINE IN NACL 20-0.9 MG/50ML-% IV SOLN
INTRAVENOUS | Status: AC
  Filled 2016-11-30: qty 50

## 2016-11-30 MED ORDER — SODIUM CHLORIDE 0.9% FLUSH
10.0000 mL | INTRAVENOUS | Status: DC | PRN
  Administered 2016-11-30: 10 mL via INTRAVENOUS
  Filled 2016-11-30: qty 10

## 2016-11-30 MED ORDER — DIPHENHYDRAMINE HCL 50 MG/ML IJ SOLN
50.0000 mg | Freq: Once | INTRAMUSCULAR | Status: AC
Start: 2016-11-30 — End: 2016-11-30
  Administered 2016-11-30: 50 mg via INTRAVENOUS

## 2016-11-30 MED ORDER — ACETAMINOPHEN 325 MG PO TABS
650.0000 mg | ORAL_TABLET | Freq: Once | ORAL | Status: AC
  Administered 2016-11-30: 650 mg via ORAL

## 2016-11-30 NOTE — Progress Notes (Signed)
Port flushed x4 repositioned x2 with no blood return. Blood drawn peripherally. Cathflow was released and  Administered by Lavella Lemons RN @ [phone]. Porsche Cates LPN

## 2016-11-30 NOTE — Patient Instructions (Signed)
[facility] Discharge Instructions for Patients Receiving Chemotherapy  Today you received the following chemotherapy agents: Herceptin and Taxol   To help prevent nausea and vomiting after your treatment, we encourage you to take your nausea medication as directed.    If you develop nausea and vomiting that is not controlled by your nausea medication, call the clinic.   BELOW ARE SYMPTOMS THAT SHOULD BE REPORTED IMMEDIATELY:  *FEVER GREATER THAN 100.5 F  *CHILLS WITH OR WITHOUT FEVER  NAUSEA AND VOMITING THAT IS NOT CONTROLLED WITH YOUR NAUSEA MEDICATION  *UNUSUAL SHORTNESS OF BREATH  *UNUSUAL BRUISING OR BLEEDING  TENDERNESS IN MOUTH AND THROAT WITH OR WITHOUT PRESENCE OF ULCERS  *URINARY PROBLEMS  *BOWEL PROBLEMS  UNUSUAL RASH Items with * indicate a potential emergency and should be followed up as soon as possible.  Feel free to call the clinic you have any questions or concerns. The clinic phone number is [phone].  Please show the [place] at check-in to the Emergency Department and triage nurse.

## 2016-12-07 ENCOUNTER — Ambulatory Visit: Payer: No Typology Code available for payment source

## 2016-12-07 ENCOUNTER — Encounter: Payer: Self-pay | Admitting: Hematology and Oncology

## 2016-12-07 ENCOUNTER — Ambulatory Visit (HOSPITAL_BASED_OUTPATIENT_CLINIC_OR_DEPARTMENT_OTHER): Payer: No Typology Code available for payment source

## 2016-12-07 ENCOUNTER — Encounter: Payer: Self-pay | Admitting: *Deleted

## 2016-12-07 ENCOUNTER — Ambulatory Visit (HOSPITAL_BASED_OUTPATIENT_CLINIC_OR_DEPARTMENT_OTHER): Payer: No Typology Code available for payment source | Admitting: Hematology and Oncology

## 2016-12-07 ENCOUNTER — Other Ambulatory Visit (HOSPITAL_BASED_OUTPATIENT_CLINIC_OR_DEPARTMENT_OTHER): Payer: No Typology Code available for payment source

## 2016-12-07 DIAGNOSIS — Z170 Estrogen receptor positive status [ER+]: Principal | ICD-10-CM

## 2016-12-07 DIAGNOSIS — Z5112 Encounter for antineoplastic immunotherapy: Secondary | ICD-10-CM

## 2016-12-07 DIAGNOSIS — C50312 Malignant neoplasm of lower-inner quadrant of left female breast: Secondary | ICD-10-CM

## 2016-12-07 DIAGNOSIS — Z95828 Presence of other vascular implants and grafts: Secondary | ICD-10-CM

## 2016-12-07 DIAGNOSIS — Z5111 Encounter for antineoplastic chemotherapy: Secondary | ICD-10-CM | POA: Diagnosis not present

## 2016-12-07 LAB — CBC WITH DIFFERENTIAL/PLATELET
BASO%: 0.7 % (ref 0.0–2.0)
Basophils Absolute: 0.1 10*3/uL (ref 0.0–0.1)
EOS%: 2.8 % (ref 0.0–7.0)
Eosinophils Absolute: 0.2 10*3/uL (ref 0.0–0.5)
HCT: 41.4 % (ref 34.8–46.6)
HGB: 13 g/dL (ref 11.6–15.9)
LYMPH%: 28.3 % (ref 14.0–49.7)
MCH: 27.7 pg (ref 25.1–34.0)
MCHC: 31.4 g/dL — AB (ref 31.5–36.0)
MCV: 88.1 fL (ref 79.5–101.0)
MONO#: 0.5 10*3/uL (ref 0.1–0.9)
MONO%: 5.9 % (ref 0.0–14.0)
NEUT#: 5 10*3/uL (ref 1.5–6.5)
NEUT%: 62.3 % (ref 38.4–76.8)
PLATELETS: 319 10*3/uL (ref 145–400)
RBC: 4.7 10*6/uL (ref 3.70–5.45)
RDW: 16.9 % — ABNORMAL HIGH (ref 11.2–14.5)
WBC: 8.1 10*3/uL (ref 3.9–10.3)
lymph#: 2.3 10*3/uL (ref 0.9–3.3)

## 2016-12-07 LAB — COMPREHENSIVE METABOLIC PANEL
ALT: 24 U/L (ref 0–55)
ANION GAP: 9 meq/L (ref 3–11)
AST: 13 U/L (ref 5–34)
Albumin: 3.3 g/dL — ABNORMAL LOW (ref 3.5–5.0)
Alkaline Phosphatase: 129 U/L (ref 40–150)
BUN: 16.7 mg/dL (ref 7.0–26.0)
CHLORIDE: 106 meq/L (ref 98–109)
CO2: 26 meq/L (ref 22–29)
CREATININE: 0.8 mg/dL (ref 0.6–1.1)
Calcium: 9.6 mg/dL (ref 8.4–10.4)
EGFR: 75 mL/min/{1.73_m2} — ABNORMAL LOW (ref >90)
Glucose: 246 mg/dl — ABNORMAL HIGH (ref 70–140)
POTASSIUM: 3.7 meq/L (ref 3.5–5.1)
Sodium: 141 mEq/L (ref 136–145)
Total Bilirubin: 0.48 mg/dL (ref 0.20–1.20)
Total Protein: 7 g/dL (ref 6.4–8.3)

## 2016-12-07 LAB — TECHNOLOGIST REVIEW

## 2016-12-07 MED ORDER — SODIUM CHLORIDE 0.9 % IV SOLN
300.0000 mg | Freq: Once | INTRAVENOUS | Status: AC
  Administered 2016-12-07: 300 mg via INTRAVENOUS
  Filled 2016-12-07: qty 14.29

## 2016-12-07 MED ORDER — SODIUM CHLORIDE 0.9% FLUSH
10.0000 mL | INTRAVENOUS | Status: DC | PRN
  Administered 2016-12-07: 10 mL
  Filled 2016-12-07: qty 10

## 2016-12-07 MED ORDER — PACLITAXEL CHEMO INJECTION 300 MG/50ML
65.0000 mg/m2 | Freq: Once | INTRAVENOUS | Status: AC
  Administered 2016-12-07: 168 mg via INTRAVENOUS
  Filled 2016-12-07: qty 28

## 2016-12-07 MED ORDER — FAMOTIDINE IN NACL 20-0.9 MG/50ML-% IV SOLN
INTRAVENOUS | Status: AC
  Filled 2016-12-07: qty 50

## 2016-12-07 MED ORDER — HEPARIN SOD (PORK) LOCK FLUSH 100 UNIT/ML IV SOLN
500.0000 [IU] | Freq: Once | INTRAVENOUS | Status: AC | PRN
  Administered 2016-12-07: 500 [IU]
  Filled 2016-12-07: qty 5

## 2016-12-07 MED ORDER — ONDANSETRON HCL 4 MG/2ML IJ SOLN
8.0000 mg | Freq: Once | INTRAMUSCULAR | Status: AC
  Administered 2016-12-07: 8 mg via INTRAVENOUS

## 2016-12-07 MED ORDER — DEXAMETHASONE SODIUM PHOSPHATE 10 MG/ML IJ SOLN
INTRAMUSCULAR | Status: AC
  Filled 2016-12-07: qty 1

## 2016-12-07 MED ORDER — DEXAMETHASONE SODIUM PHOSPHATE 10 MG/ML IJ SOLN
10.0000 mg | Freq: Once | INTRAMUSCULAR | Status: AC
  Administered 2016-12-07: 10 mg via INTRAVENOUS

## 2016-12-07 MED ORDER — DIPHENHYDRAMINE HCL 50 MG/ML IJ SOLN
INTRAMUSCULAR | Status: AC
  Filled 2016-12-07: qty 1

## 2016-12-07 MED ORDER — FAMOTIDINE IN NACL 20-0.9 MG/50ML-% IV SOLN
20.0000 mg | Freq: Once | INTRAVENOUS | Status: AC
  Administered 2016-12-07: 20 mg via INTRAVENOUS

## 2016-12-07 MED ORDER — DIPHENHYDRAMINE HCL 50 MG/ML IJ SOLN
50.0000 mg | Freq: Once | INTRAMUSCULAR | Status: AC
  Administered 2016-12-07: 50 mg via INTRAVENOUS

## 2016-12-07 MED ORDER — SODIUM CHLORIDE 0.9% FLUSH
10.0000 mL | INTRAVENOUS | Status: DC | PRN
  Administered 2016-12-07: 10 mL via INTRAVENOUS
  Filled 2016-12-07: qty 10

## 2016-12-07 MED ORDER — ACETAMINOPHEN 325 MG PO TABS
650.0000 mg | ORAL_TABLET | Freq: Once | ORAL | Status: AC
  Administered 2016-12-07: 650 mg via ORAL

## 2016-12-07 MED ORDER — ONDANSETRON HCL 4 MG/2ML IJ SOLN
INTRAMUSCULAR | Status: AC
  Filled 2016-12-07: qty 2

## 2016-12-07 MED ORDER — SODIUM CHLORIDE 0.9 % IV SOLN
Freq: Once | INTRAVENOUS | Status: AC
  Administered 2016-12-07: 12:00:00 via INTRAVENOUS

## 2016-12-07 MED ORDER — ACETAMINOPHEN 325 MG PO TABS
ORAL_TABLET | ORAL | Status: AC
  Filled 2016-12-07: qty 2

## 2016-12-07 NOTE — Progress Notes (Signed)
Patient Care Team: Janith Lima, MD as PCP - General (Internal Medicine) Alphonsa Overall, MD as Consulting Physician (General Surgery) Nicholas Lose, MD as Consulting Physician (Hematology and Oncology) Eppie Gibson, MD as Attending Physician (Radiation Oncology)  DIAGNOSIS:  Encounter Diagnosis  Name Primary?  . Malignant neoplasm of lower-inner quadrant of left breast in female, estrogen receptor positive ([place])     SUMMARY OF ONCOLOGIC HISTORY:   Malignant neoplasm of lower-inner quadrant of left breast in female, estrogen receptor positive ([place])   08/18/2016 Initial Diagnosis    Left breast biopsy 8:00 retroareolar position: IDC, high-grade with DCIS high-grade, ER 90%, PR 95%, Ki-67 20%, HER-2 positive ratio 3.4; screening detected left breast distortion LIQ 8 8:00 retroareolar 0.8 cm, axilla negative, T1b N0 stage IA clinical stage      09/22/2016 Surgery    Left lumpectomy: IDC grade 3, 1.3 cm, DCIS high-grade, margins clear, 0/1 lymph node negative, T1CN 0 stage IA, ER 90%, PR 95%, Ki-67 20%, HER-2 positive ratio 3.4      10/19/2016 -  Chemotherapy    Taxol Herceptin weekly 12 followed by Herceptin maintenance every 3 weeks for 1 year        CHIEF COMPLIANT: Cycle 8 Taxol and Herceptin  INTERVAL HISTORY: Sandra Wood is a 61 year old with above-mentioned history of adjuvant therapy for breast cancer. She is currently on Taxol Herceptin weekly. She is tolerating it should be well. She had no issues with nausea vomiting. She denies any lumps or nodules in the breast. She reports very occasional neuropathy .  REVIEW OF SYSTEMS:   Constitutional: Denies fevers, chills or abnormal weight loss Eyes: Denies blurriness of vision Ears, nose, mouth, throat, and face: Denies mucositis or sore throat Respiratory: Denies cough, dyspnea or wheezes Cardiovascular: Denies palpitation, chest discomfort Gastrointestinal:  Denies nausea, heartburn or change in bowel  habits Skin: Denies abnormal skin rashes Lymphatics: Denies new lymphadenopathy or easy bruising Neurological:Denies numbness, tingling or new weaknesses Behavioral/Psych: Mood is stable, no new changes  Extremities: No lower extremity edema  All other systems were reviewed with the patient and are negative.  I have reviewed the past medical history, past surgical history, social history and family history with the patient and they are unchanged from previous note.  ALLERGIES:  is allergic to no known allergies.  MEDICATIONS:  Current Outpatient Prescriptions  Medication Sig Dispense Refill  . azithromycin (ZITHROMAX Z-PAK) 250 MG tablet Use as directed on package 6 each 0  . HYDROcodone-acetaminophen (NORCO/VICODIN) 5-325 MG tablet Take 1-2 tablets by mouth every 6 (six) hours as needed. 20 tablet 0  . ibuprofen (ADVIL,MOTRIN) 200 MG tablet Take 400-600 mg by mouth every 6 (six) hours as needed for headache or mild pain (depends on pain if takes 2-3 tablets).     Marland Kitchen ipratropium-albuterol (DUONEB) 0.5-2.5 (3) MG/3ML SOLN Inhale 3 mLs into the lungs every 4 (four) hours as needed (shortness of breath).     . lidocaine-prilocaine (EMLA) cream Apply to affected area once 30 g 3  . lidocaine-prilocaine (EMLA) cream Apply to affected area once 30 g 3  . LORazepam (ATIVAN) 0.5 MG tablet Take 1 tablet (0.5 mg total) by mouth every 6 (six) hours as needed (Nausea or vomiting). 30 tablet 0  . ondansetron (ZOFRAN) 8 MG tablet Take 1 tablet (8 mg total) by mouth 2 (two) times daily as needed (Nausea or vomiting). 30 tablet 1  . PROAIR HFA 108 (90 Base) MCG/ACT inhaler Inhale 2 puffs into the lungs  every 6 (six) hours as needed for wheezing or shortness of breath.     . prochlorperazine (COMPAZINE) 10 MG tablet Take 1 tablet (10 mg total) by mouth every 6 (six) hours as needed (Nausea or vomiting). 30 tablet 1  . sertraline (ZOLOFT) 50 MG tablet Take 1 tablet (50 mg total) by mouth daily. (Patient  taking differently: Take 50 mg by mouth daily as needed. ) 90 tablet 3   No current facility-administered medications for this visit.     PHYSICAL EXAMINATION: ECOG PERFORMANCE STATUS: 1 - Symptomatic but completely ambulatory  Vitals:   12/07/16 1054  BP: (!) 154/68  Pulse: 95  Resp: (!) 21  Temp: 98.4 F (36.9 C)   Filed Weights   12/07/16 1054  Weight: (!) 339 lb 3.2 oz (153.9 kg)    GENERAL:alert, no distress and comfortable SKIN: skin color, texture, turgor are normal, no rashes or significant lesions EYES: normal, Conjunctiva are pink and non-injected, sclera clear OROPHARYNX:no exudate, no erythema and lips, buccal mucosa, and tongue normal  NECK: supple, thyroid normal size, non-tender, without nodularity LYMPH:  no palpable lymphadenopathy in the cervical, axillary or inguinal LUNGS: clear to auscultation and percussion with normal breathing effort HEART: regular rate & rhythm and no murmurs and no lower extremity edema ABDOMEN:abdomen soft, non-tender and normal bowel sounds MUSCULOSKELETAL:no cyanosis of digits and no clubbing  NEURO: alert & oriented x 3 with fluent speech, no focal motor/sensory deficits EXTREMITIES: No lower extremity edema   LABORATORY DATA:  I have reviewed the data as listed   Chemistry      Component Value Date/Time   NA 141 12/07/2016 1020   K 3.7 12/07/2016 1020   CL 103 09/15/2016 1255   CO2 26 12/07/2016 1020   BUN 16.7 12/07/2016 1020   CREATININE 0.8 12/07/2016 1020      Component Value Date/Time   CALCIUM 9.6 12/07/2016 1020   ALKPHOS 129 12/07/2016 1020   AST 13 12/07/2016 1020   ALT 24 12/07/2016 1020   BILITOT 0.48 12/07/2016 1020       Lab Results  Component Value Date   WBC 8.1 12/07/2016   HGB 13.0 12/07/2016   HCT 41.4 12/07/2016   MCV 88.1 12/07/2016   PLT 319 12/07/2016   NEUTROABS 5.0 12/07/2016    ASSESSMENT & PLAN:  Malignant neoplasm of lower-inner quadrant of left breast in female, estrogen  receptor positive ([facility]) Left breast biopsy02/20/2018:8:00 retroareolar position: IDC, high-grade with DCIS high-grade, ER 90%, PR 95%, Ki-67 20%, HER-2 positive ratio 3.4; screening detected left breast distortion LIQ 8 8:00 retroareolar 0.8 cm, axilla negative, T1b N0 stage IA clinical stage Left lumpectomy 09/22/2016: IDC grade 3, 1.3 cm, DCIS high-grade, margins clear, 0/1 lymph node negative, T1 CN 0 stage IA, ER 90%, PR 95%, Ki-67 20%, HER-2 positive ratio 3.4  Recommendation: 1. Adjuvant chemotherapy and Herceptin with weekly Taxol and Herceptin 12 followed by Herceptin maintenance for 1 year 3. Followed by adjuvant radiation 4. Followed by adjuvant antiestrogen therapy with letrozole 2.5 mg daily 5 years ----------------------------------------------------------------------------- Current Treatment: Taxol Herceptin Cycle 8 Chemotherapy toxicities:  1. Bone and muscle pain 2. Fatigue 3.Maculopapular skin rash in the chest arms and face: She is using hydrocortisone cream. We will keep a close watch on this.  Diabetes with elevated blood sugars: We are carefully monitoring them. Labs reviewed Monitoring closely for chemotherapy toxicities  RTC in 2week for tox check and for cycle 10.  I spent 25 minutes talking to the patient  of which more than half was spent in counseling and coordination of care.  No orders of the defined types were placed in this encounter.  The patient has a good understanding of the overall plan. she agrees with it. she will call with any problems that may develop before the next visit here.   Rulon Eisenmenger, MD 12/07/16

## 2016-12-07 NOTE — Patient Instructions (Signed)
[facility] Discharge Instructions for Patients Receiving Chemotherapy  Today you received the following chemotherapy agents Taxol and Herceptin.  To help prevent nausea and vomiting after your treatment, we encourage you to take your nausea medication.   If you develop nausea and vomiting that is not controlled by your nausea medication, call the clinic.   BELOW ARE SYMPTOMS THAT SHOULD BE REPORTED IMMEDIATELY:  *FEVER GREATER THAN 100.5 F  *CHILLS WITH OR WITHOUT FEVER  NAUSEA AND VOMITING THAT IS NOT CONTROLLED WITH YOUR NAUSEA MEDICATION  *UNUSUAL SHORTNESS OF BREATH  *UNUSUAL BRUISING OR BLEEDING  TENDERNESS IN MOUTH AND THROAT WITH OR WITHOUT PRESENCE OF ULCERS  *URINARY PROBLEMS  *BOWEL PROBLEMS  UNUSUAL RASH Items with * indicate a potential emergency and should be followed up as soon as possible.  Feel free to call the clinic you have any questions or concerns. The clinic phone number is [phone].  Please show the [place] at check-in to the Emergency Department and triage nurse.

## 2016-12-07 NOTE — Assessment & Plan Note (Signed)
Left breast biopsy02/20/2018:8:00 retroareolar position: IDC, high-grade with DCIS high-grade, ER 90%, PR 95%, Ki-67 20%, HER-2 positive ratio 3.4; screening detected left breast distortion LIQ 8 8:00 retroareolar 0.8 cm, axilla negative, T1b N0 stage IA clinical stage Left lumpectomy 09/22/2016: IDC grade 3, 1.3 cm, DCIS high-grade, margins clear, 0/1 lymph node negative, T1 CN 0 stage IA, ER 90%, PR 95%, Ki-67 20%, HER-2 positive ratio 3.4  Recommendation: 1. Adjuvant chemotherapy and Herceptin with weekly Taxol and Herceptin 12 followed by Herceptin maintenance for 1 year 3. Followed by adjuvant radiation 4. Followed by adjuvant antiestrogen therapy with letrozole 2.5 mg daily 5 years ----------------------------------------------------------------------------- Current Treatment: Taxol Herceptin Cycle 10 Chemotherapy toxicities:  1. Bone and muscle pain 2. Fatigue 3.Maculopapular skin rash in the chest arms and face: She is using hydrocortisone cream. We will keep a close watch on this.  Diabetes with elevated blood sugars: We are carefully monitoring them. Labs reviewed Monitoring closely for chemotherapy toxicities  RTC in 2week for tox check and for cycle 12. I will set her up for adjuvant radiation therapy after the cycle.

## 2016-12-14 ENCOUNTER — Ambulatory Visit (HOSPITAL_BASED_OUTPATIENT_CLINIC_OR_DEPARTMENT_OTHER): Payer: No Typology Code available for payment source

## 2016-12-14 ENCOUNTER — Other Ambulatory Visit (HOSPITAL_BASED_OUTPATIENT_CLINIC_OR_DEPARTMENT_OTHER): Payer: No Typology Code available for payment source

## 2016-12-14 ENCOUNTER — Ambulatory Visit: Payer: No Typology Code available for payment source

## 2016-12-14 VITALS — BP 153/66 | HR 95 | Temp 98.2°F | Resp 20

## 2016-12-14 DIAGNOSIS — Z5111 Encounter for antineoplastic chemotherapy: Secondary | ICD-10-CM

## 2016-12-14 DIAGNOSIS — Z5112 Encounter for antineoplastic immunotherapy: Secondary | ICD-10-CM | POA: Diagnosis not present

## 2016-12-14 DIAGNOSIS — Z170 Estrogen receptor positive status [ER+]: Principal | ICD-10-CM

## 2016-12-14 DIAGNOSIS — Z95828 Presence of other vascular implants and grafts: Secondary | ICD-10-CM

## 2016-12-14 DIAGNOSIS — C50312 Malignant neoplasm of lower-inner quadrant of left female breast: Secondary | ICD-10-CM

## 2016-12-14 LAB — CBC WITH DIFFERENTIAL/PLATELET
BASO%: 0.7 % (ref 0.0–2.0)
Basophils Absolute: 0.1 10*3/uL (ref 0.0–0.1)
EOS ABS: 0.1 10*3/uL (ref 0.0–0.5)
EOS%: 1.9 % (ref 0.0–7.0)
HCT: 39.1 % (ref 34.8–46.6)
HEMOGLOBIN: 12.3 g/dL (ref 11.6–15.9)
LYMPH#: 2.2 10*3/uL (ref 0.9–3.3)
LYMPH%: 32.1 % (ref 14.0–49.7)
MCH: 28.2 pg (ref 25.1–34.0)
MCHC: 31.5 g/dL (ref 31.5–36.0)
MCV: 89.7 fL (ref 79.5–101.0)
MONO#: 0.5 10*3/uL (ref 0.1–0.9)
MONO%: 7.1 % (ref 0.0–14.0)
NEUT%: 58.2 % (ref 38.4–76.8)
NEUTROS ABS: 4 10*3/uL (ref 1.5–6.5)
Platelets: 273 10*3/uL (ref 145–400)
RBC: 4.36 10*6/uL (ref 3.70–5.45)
RDW: 18 % — AB (ref 11.2–14.5)
WBC: 6.8 10*3/uL (ref 3.9–10.3)

## 2016-12-14 LAB — COMPREHENSIVE METABOLIC PANEL
ALBUMIN: 3.1 g/dL — AB (ref 3.5–5.0)
ALK PHOS: 117 U/L (ref 40–150)
ALT: 27 U/L (ref 0–55)
AST: 15 U/L (ref 5–34)
Anion Gap: 9 mEq/L (ref 3–11)
BILIRUBIN TOTAL: 0.52 mg/dL (ref 0.20–1.20)
BUN: 12.5 mg/dL (ref 7.0–26.0)
CO2: 25 mEq/L (ref 22–29)
CREATININE: 0.8 mg/dL (ref 0.6–1.1)
Calcium: 9.2 mg/dL (ref 8.4–10.4)
Chloride: 108 mEq/L (ref 98–109)
EGFR: 81 mL/min/{1.73_m2} — ABNORMAL LOW (ref >90)
GLUCOSE: 229 mg/dL — AB (ref 70–140)
Potassium: 3.7 mEq/L (ref 3.5–5.1)
Sodium: 142 mEq/L (ref 136–145)
TOTAL PROTEIN: 6.5 g/dL (ref 6.4–8.3)

## 2016-12-14 LAB — TECHNOLOGIST REVIEW: Technologist Review: 1

## 2016-12-14 MED ORDER — FAMOTIDINE IN NACL 20-0.9 MG/50ML-% IV SOLN
INTRAVENOUS | Status: AC
  Filled 2016-12-14: qty 50

## 2016-12-14 MED ORDER — PALONOSETRON HCL INJECTION 0.25 MG/5ML
INTRAVENOUS | Status: AC
  Filled 2016-12-14: qty 5

## 2016-12-14 MED ORDER — SODIUM CHLORIDE 0.9 % IV SOLN
Freq: Once | INTRAVENOUS | Status: AC
  Administered 2016-12-14: 13:00:00 via INTRAVENOUS

## 2016-12-14 MED ORDER — ACETAMINOPHEN 325 MG PO TABS
ORAL_TABLET | ORAL | Status: AC
  Filled 2016-12-14: qty 2

## 2016-12-14 MED ORDER — FAMOTIDINE IN NACL 20-0.9 MG/50ML-% IV SOLN
20.0000 mg | Freq: Once | INTRAVENOUS | Status: AC
  Administered 2016-12-14: 20 mg via INTRAVENOUS

## 2016-12-14 MED ORDER — DEXAMETHASONE SODIUM PHOSPHATE 10 MG/ML IJ SOLN
INTRAMUSCULAR | Status: AC
  Filled 2016-12-14: qty 1

## 2016-12-14 MED ORDER — TRASTUZUMAB CHEMO 150 MG IV SOLR
300.0000 mg | Freq: Once | INTRAVENOUS | Status: AC
  Administered 2016-12-14: 300 mg via INTRAVENOUS
  Filled 2016-12-14: qty 14.29

## 2016-12-14 MED ORDER — DIPHENHYDRAMINE HCL 25 MG PO CAPS: 50.0000 mg | ORAL_CAPSULE | Freq: Once | ORAL | Status: DC

## 2016-12-14 MED ORDER — SODIUM CHLORIDE 0.9 % IV SOLN: Freq: Once | INTRAVENOUS | Status: DC

## 2016-12-14 MED ORDER — ACETAMINOPHEN 325 MG PO TABS
650.0000 mg | ORAL_TABLET | Freq: Once | ORAL | Status: AC
  Administered 2016-12-14: 650 mg via ORAL

## 2016-12-14 MED ORDER — ONDANSETRON HCL 4 MG/2ML IJ SOLN
8.0000 mg | Freq: Once | INTRAMUSCULAR | Status: AC
  Administered 2016-12-14: 8 mg via INTRAVENOUS

## 2016-12-14 MED ORDER — ONDANSETRON HCL 4 MG/2ML IJ SOLN
INTRAMUSCULAR | Status: AC
  Filled 2016-12-14: qty 4

## 2016-12-14 MED ORDER — SODIUM CHLORIDE 0.9% FLUSH
10.0000 mL | INTRAVENOUS | Status: DC | PRN
  Administered 2016-12-14: 10 mL via INTRAVENOUS
  Filled 2016-12-14: qty 10

## 2016-12-14 MED ORDER — DEXTROSE 5 % IV SOLN
65.0000 mg/m2 | Freq: Once | INTRAVENOUS | Status: AC
  Administered 2016-12-14: 168 mg via INTRAVENOUS
  Filled 2016-12-14: qty 28

## 2016-12-14 MED ORDER — DEXAMETHASONE SODIUM PHOSPHATE 10 MG/ML IJ SOLN
10.0000 mg | Freq: Once | INTRAMUSCULAR | Status: AC
  Administered 2016-12-14: 10 mg via INTRAVENOUS

## 2016-12-14 MED ORDER — DIPHENHYDRAMINE HCL 50 MG/ML IJ SOLN
50.0000 mg | Freq: Once | INTRAMUSCULAR | Status: AC
  Administered 2016-12-14: 50 mg via INTRAVENOUS

## 2016-12-14 MED ORDER — DIPHENHYDRAMINE HCL 50 MG/ML IJ SOLN
INTRAMUSCULAR | Status: AC
Start: 2016-12-14 — End: 2016-12-14
  Filled 2016-12-14: qty 1

## 2016-12-14 MED ORDER — SODIUM CHLORIDE 0.9% FLUSH
10.0000 mL | INTRAVENOUS | Status: DC | PRN
  Administered 2016-12-14: 10 mL
  Filled 2016-12-14: qty 10

## 2016-12-14 MED ORDER — HEPARIN SOD (PORK) LOCK FLUSH 100 UNIT/ML IV SOLN
500.0000 [IU] | Freq: Once | INTRAVENOUS | Status: AC | PRN
  Administered 2016-12-14: 500 [IU]
  Filled 2016-12-14: qty 5

## 2016-12-14 NOTE — Patient Instructions (Signed)
[facility] Discharge Instructions for Patients Receiving Chemotherapy  Today you received the following chemotherapy agents Taxol and Herceptin.  To help prevent nausea and vomiting after your treatment, we encourage you to take your nausea medication.   If you develop nausea and vomiting that is not controlled by your nausea medication, call the clinic.   BELOW ARE SYMPTOMS THAT SHOULD BE REPORTED IMMEDIATELY:  *FEVER GREATER THAN 100.5 F  *CHILLS WITH OR WITHOUT FEVER  NAUSEA AND VOMITING THAT IS NOT CONTROLLED WITH YOUR NAUSEA MEDICATION  *UNUSUAL SHORTNESS OF BREATH  *UNUSUAL BRUISING OR BLEEDING  TENDERNESS IN MOUTH AND THROAT WITH OR WITHOUT PRESENCE OF ULCERS  *URINARY PROBLEMS  *BOWEL PROBLEMS  UNUSUAL RASH Items with * indicate a potential emergency and should be followed up as soon as possible.  Feel free to call the clinic you have any questions or concerns. The clinic phone number is [phone].  Please show the [place] at check-in to the Emergency Department and triage nurse.

## 2016-12-14 NOTE — Patient Instructions (Signed)

## 2016-12-16 ENCOUNTER — Ambulatory Visit (HOSPITAL_COMMUNITY)
Admission: RE | Admit: 2016-12-16 | Discharge: 2016-12-16 | Disposition: A | Discharge status: Home / Self Care | Payer: No Typology Code available for payment source | Source: Ambulatory Visit | Attending: Internal Medicine | Admitting: Internal Medicine

## 2016-12-16 ENCOUNTER — Ambulatory Visit (HOSPITAL_BASED_OUTPATIENT_CLINIC_OR_DEPARTMENT_OTHER)
Admission: RE | Admit: 2016-12-16 | Discharge: 2016-12-16 | Disposition: A | Discharge status: Home / Self Care | Payer: No Typology Code available for payment source | Source: Ambulatory Visit | Attending: Cardiology | Admitting: Cardiology

## 2016-12-16 ENCOUNTER — Other Ambulatory Visit (HOSPITAL_COMMUNITY): Payer: No Typology Code available for payment source

## 2016-12-16 ENCOUNTER — Encounter (HOSPITAL_COMMUNITY): Payer: Self-pay

## 2016-12-16 VITALS — BP 150/68 | HR 73 | Wt 334.0 lb

## 2016-12-16 DIAGNOSIS — E119 Type 2 diabetes mellitus without complications: Secondary | ICD-10-CM | POA: Diagnosis not present

## 2016-12-16 DIAGNOSIS — Z87891 Personal history of nicotine dependence: Secondary | ICD-10-CM | POA: Diagnosis not present

## 2016-12-16 DIAGNOSIS — C50312 Malignant neoplasm of lower-inner quadrant of left female breast: Secondary | ICD-10-CM | POA: Insufficient documentation

## 2016-12-16 DIAGNOSIS — Z170 Estrogen receptor positive status [ER+]: Secondary | ICD-10-CM | POA: Diagnosis present

## 2016-12-16 DIAGNOSIS — J45909 Unspecified asthma, uncomplicated: Secondary | ICD-10-CM | POA: Diagnosis not present

## 2016-12-16 DIAGNOSIS — Z79899 Other long term (current) drug therapy: Secondary | ICD-10-CM | POA: Diagnosis not present

## 2016-12-16 NOTE — Progress Notes (Signed)
  Echocardiogram 2D Echocardiogram has been performed.  Ark Agrusa L Androw 12/16/2016, 9:51 AM

## 2016-12-16 NOTE — Progress Notes (Signed)
Oncology: Dr. Lindi Adie  61 yo with history of DM2 and asthma has developed breast cancer.  She presents for cardio-oncology clinic evaluation.  Breast cancer is on left, ER+/PR+/HER2+.  She had left lumpectomy in 3/18 followed by Taxol + Herceptin to extend for 12 cycles starting 4/18.  She will then continue Herceptin to complete a full year.   No known cardiac problems. No chest pain.  No exertional dyspnea.  Mild fatigue.  BP high today, says it has been high at some of her prior appointments.   PMH: 1. Asthma: Mild persistent.  2. Type II diabetes. 3. GERD 4. Breast cancer: On left.  ER+/PR+/HER2+.  She had left lumpectomy in 4/18 followed by Taxol + Herceptin x 12 cycles then Herceptin to complete a full year.  - Echo (3/18): EF 60-65%, normal RV size and systolic function, strain imaging not done.  - Echo (6/18): EF 68-37%, normal diastolic function, GLS -29.0% but images were technically difficult.   Social History   Social History  . Marital status: Divorced    Spouse name: N/A  . Number of children: N/A  . Years of education: N/A   Occupational History  . Not on file.   Social History Main Topics  . Smoking status: Former Smoker    Years: 5.00    Types: Cigarettes    Quit date: 08/13/1986  . Smokeless tobacco: Never Used  . Alcohol use Yes     Comment: OCC.  . Drug use: No  . Sexual activity: Not Currently    Birth control/ protection: Post-menopausal, Abstinence   Other Topics Concern  . Not on file   Social History Narrative  . No narrative on file    Family History  Problem Relation Age of Onset  . Diabetes Mother   . Heart disease Father   . Cancer Brother 34       prostate  . Breast cancer Maternal Grandmother 88   ROS: All systems reviewed and negative except as per HPI.   Current Outpatient Prescriptions  Medication Sig Dispense Refill  . HYDROcodone-acetaminophen (NORCO/VICODIN) 5-325 MG tablet Take 1-2 tablets by mouth every 6 (six) hours as  needed. 20 tablet 0  . ibuprofen (ADVIL,MOTRIN) 200 MG tablet Take 400-600 mg by mouth every 6 (six) hours as needed for headache or mild pain (depends on pain if takes 2-3 tablets).     Marland Kitchen ipratropium-albuterol (DUONEB) 0.5-2.5 (3) MG/3ML SOLN Inhale 3 mLs into the lungs every 4 (four) hours as needed (shortness of breath).     . lidocaine-prilocaine (EMLA) cream Apply to affected area once 30 g 3  . LORazepam (ATIVAN) 0.5 MG tablet Take 1 tablet (0.5 mg total) by mouth every 6 (six) hours as needed (Nausea or vomiting). 30 tablet 0  . ondansetron (ZOFRAN) 8 MG tablet Take 1 tablet (8 mg total) by mouth 2 (two) times daily as needed (Nausea or vomiting). 30 tablet 1  . PROAIR HFA 108 (90 Base) MCG/ACT inhaler Inhale 2 puffs into the lungs every 6 (six) hours as needed for wheezing or shortness of breath.     . prochlorperazine (COMPAZINE) 10 MG tablet Take 1 tablet (10 mg total) by mouth every 6 (six) hours as needed (Nausea or vomiting). 30 tablet 1  . sertraline (ZOLOFT) 50 MG tablet Take 1 tablet (50 mg total) by mouth daily. (Patient taking differently: Take 50 mg by mouth daily as needed. ) 90 tablet 3   No current facility-administered medications for this encounter.  BP (!) 150/68   Pulse 73   Wt (!) 334 lb (151.5 kg)   LMP 06/30/2003   SpO2 94%   BMI 61.09 kg/m  General: NAD, obese.  Neck: No JVD, no thyromegaly or thyroid nodule.  Lungs: Clear bilaterally.  CV: Nondisplaced PMI.  Heart regular S1/S2, no S3/S4, no murmur.  No peripheral edema.  No carotid bruit.  Normal pedal pulses.  Abdomen: Soft, nontender, no hepatosplenomegaly, no distention.  Skin: Intact without lesions or rashes.  Neurologic: Alert and oriented x 3.  Psych: Normal affect. Extremities: No clubbing or cyanosis.  HEENT: Normal.   Assessment/Plan: 1. Breast cancer: She started Herceptin-based therapy in 4/18, this will extend for a year.  I reviewed today's echo, it shows normal EF.  Strain images were  technically difficult so I would not make clinical decisions on this.  - Continue Herceptin, repeat echo in 3 months with office visit.  2. Elevated BP: BP high today.  If this persists, she will need treatment.  Will reassess at followup.   Loralie Champagne 12/16/2016

## 2016-12-16 NOTE — Patient Instructions (Signed)
Follow up and echo in 3 months with Dr.McLean  

## 2016-12-21 ENCOUNTER — Ambulatory Visit: Payer: No Typology Code available for payment source

## 2016-12-21 ENCOUNTER — Ambulatory Visit (HOSPITAL_BASED_OUTPATIENT_CLINIC_OR_DEPARTMENT_OTHER): Payer: No Typology Code available for payment source

## 2016-12-21 ENCOUNTER — Ambulatory Visit (HOSPITAL_BASED_OUTPATIENT_CLINIC_OR_DEPARTMENT_OTHER): Payer: No Typology Code available for payment source | Admitting: Adult Health

## 2016-12-21 ENCOUNTER — Other Ambulatory Visit (HOSPITAL_BASED_OUTPATIENT_CLINIC_OR_DEPARTMENT_OTHER): Payer: No Typology Code available for payment source

## 2016-12-21 ENCOUNTER — Encounter: Payer: Self-pay | Admitting: Adult Health

## 2016-12-21 VITALS — BP 148/63 | HR 94 | Temp 98.2°F | Resp 18 | Ht 62.0 in | Wt 330.0 lb

## 2016-12-21 DIAGNOSIS — C50312 Malignant neoplasm of lower-inner quadrant of left female breast: Secondary | ICD-10-CM

## 2016-12-21 DIAGNOSIS — Z170 Estrogen receptor positive status [ER+]: Secondary | ICD-10-CM | POA: Diagnosis not present

## 2016-12-21 DIAGNOSIS — Z5111 Encounter for antineoplastic chemotherapy: Secondary | ICD-10-CM | POA: Diagnosis not present

## 2016-12-21 DIAGNOSIS — Z95828 Presence of other vascular implants and grafts: Secondary | ICD-10-CM

## 2016-12-21 DIAGNOSIS — Z5112 Encounter for antineoplastic immunotherapy: Secondary | ICD-10-CM

## 2016-12-21 DIAGNOSIS — G620 Drug-induced polyneuropathy: Secondary | ICD-10-CM | POA: Diagnosis not present

## 2016-12-21 DIAGNOSIS — E119 Type 2 diabetes mellitus without complications: Secondary | ICD-10-CM | POA: Diagnosis not present

## 2016-12-21 LAB — COMPREHENSIVE METABOLIC PANEL
ALBUMIN: 3.1 g/dL — AB (ref 3.5–5.0)
ALK PHOS: 126 U/L (ref 40–150)
ALT: 25 U/L (ref 0–55)
AST: 13 U/L (ref 5–34)
Anion Gap: 9 mEq/L (ref 3–11)
BILIRUBIN TOTAL: 0.59 mg/dL (ref 0.20–1.20)
BUN: 13.9 mg/dL (ref 7.0–26.0)
CO2: 24 mEq/L (ref 22–29)
CREATININE: 0.8 mg/dL (ref 0.6–1.1)
Calcium: 9.1 mg/dL (ref 8.4–10.4)
Chloride: 106 mEq/L (ref 98–109)
EGFR: 78 mL/min/{1.73_m2} — AB (ref >90)
Glucose: 233 mg/dl — ABNORMAL HIGH (ref 70–140)
Potassium: 3.8 mEq/L (ref 3.5–5.1)
SODIUM: 139 meq/L (ref 136–145)
TOTAL PROTEIN: 6.6 g/dL (ref 6.4–8.3)

## 2016-12-21 LAB — CBC WITH DIFFERENTIAL/PLATELET
BASO%: 0.8 % (ref 0.0–2.0)
Basophils Absolute: 0.1 10*3/uL (ref 0.0–0.1)
EOS%: 1.6 % (ref 0.0–7.0)
Eosinophils Absolute: 0.2 10*3/uL (ref 0.0–0.5)
HCT: 38.7 % (ref 34.8–46.6)
HEMOGLOBIN: 12.8 g/dL (ref 11.6–15.9)
LYMPH%: 21.1 % (ref 14.0–49.7)
MCH: 28.8 pg (ref 25.1–34.0)
MCHC: 33.1 g/dL (ref 31.5–36.0)
MCV: 86.9 fL (ref 79.5–101.0)
MONO#: 0.7 10*3/uL (ref 0.1–0.9)
MONO%: 7 % (ref 0.0–14.0)
NEUT%: 69.5 % (ref 38.4–76.8)
NEUTROS ABS: 6.6 10*3/uL — AB (ref 1.5–6.5)
PLATELETS: 348 10*3/uL (ref 145–400)
RBC: 4.45 10*6/uL (ref 3.70–5.45)
RDW: 18.7 % — AB (ref 11.2–14.5)
WBC: 9.5 10*3/uL (ref 3.9–10.3)
lymph#: 2 10*3/uL (ref 0.9–3.3)

## 2016-12-21 LAB — TECHNOLOGIST REVIEW

## 2016-12-21 MED ORDER — ONDANSETRON HCL 4 MG/2ML IJ SOLN
INTRAMUSCULAR | Status: AC
  Filled 2016-12-21: qty 4

## 2016-12-21 MED ORDER — SODIUM CHLORIDE 0.9 % IV SOLN
Freq: Once | INTRAVENOUS | Status: AC
  Administered 2016-12-21: 14:00:00 via INTRAVENOUS
  Filled 2016-12-21: qty 4

## 2016-12-21 MED ORDER — DIPHENHYDRAMINE HCL 50 MG/ML IJ SOLN
INTRAMUSCULAR | Status: AC
  Filled 2016-12-21: qty 1

## 2016-12-21 MED ORDER — ACETAMINOPHEN 325 MG PO TABS
ORAL_TABLET | ORAL | Status: AC
  Filled 2016-12-21: qty 2

## 2016-12-21 MED ORDER — SODIUM CHLORIDE 0.9 % IV SOLN
Freq: Once | INTRAVENOUS | Status: AC
  Administered 2016-12-21: 13:00:00 via INTRAVENOUS

## 2016-12-21 MED ORDER — ADJUSTABLE LANCING DEVICE MISC: 1.0000 | 0 refills | Status: DC

## 2016-12-21 MED ORDER — FAMOTIDINE IN NACL 20-0.9 MG/50ML-% IV SOLN
INTRAVENOUS | Status: AC
  Filled 2016-12-21: qty 50

## 2016-12-21 MED ORDER — DEXAMETHASONE SODIUM PHOSPHATE 10 MG/ML IJ SOLN
INTRAMUSCULAR | Status: AC
  Filled 2016-12-21: qty 1

## 2016-12-21 MED ORDER — ACETAMINOPHEN 325 MG PO TABS
650.0000 mg | ORAL_TABLET | Freq: Once | ORAL | Status: AC
  Administered 2016-12-21: 650 mg via ORAL

## 2016-12-21 MED ORDER — LANCETS MISC: 1.0000 | Freq: Two times a day (BID) | 0 refills | Status: DC

## 2016-12-21 MED ORDER — DIPHENHYDRAMINE HCL 50 MG/ML IJ SOLN
50.0000 mg | Freq: Once | INTRAMUSCULAR | Status: AC
  Administered 2016-12-21: 50 mg via INTRAVENOUS

## 2016-12-21 MED ORDER — GLUCOSE BLOOD VI STRP: ORAL_STRIP | 12 refills | Status: DC

## 2016-12-21 MED ORDER — BLOOD GLUCOSE MONITOR KIT: PACK | 0 refills | Status: AC

## 2016-12-21 MED ORDER — HEPARIN SOD (PORK) LOCK FLUSH 100 UNIT/ML IV SOLN
500.0000 [IU] | Freq: Once | INTRAVENOUS | Status: AC | PRN
  Administered 2016-12-21: 500 [IU]
  Filled 2016-12-21: qty 5

## 2016-12-21 MED ORDER — SODIUM CHLORIDE 0.9% FLUSH
10.0000 mL | INTRAVENOUS | Status: DC | PRN
  Administered 2016-12-21: 10 mL
  Filled 2016-12-21: qty 10

## 2016-12-21 MED ORDER — DEXTROSE 5 % IV SOLN
65.0000 mg/m2 | Freq: Once | INTRAVENOUS | Status: AC
  Administered 2016-12-21: 168 mg via INTRAVENOUS
  Filled 2016-12-21: qty 28

## 2016-12-21 MED ORDER — SODIUM CHLORIDE 0.9% FLUSH
10.0000 mL | INTRAVENOUS | Status: DC | PRN
  Administered 2016-12-21: 10 mL via INTRAVENOUS
  Filled 2016-12-21: qty 10

## 2016-12-21 MED ORDER — ACETAMINOPHEN 325 MG PO TABS
ORAL_TABLET | ORAL | Status: AC
  Filled 2016-12-21: qty 1

## 2016-12-21 MED ORDER — FAMOTIDINE IN NACL 20-0.9 MG/50ML-% IV SOLN
20.0000 mg | Freq: Once | INTRAVENOUS | Status: AC
  Administered 2016-12-21: 20 mg via INTRAVENOUS

## 2016-12-21 MED ORDER — TRASTUZUMAB CHEMO 150 MG IV SOLR
300.0000 mg | Freq: Once | INTRAVENOUS | Status: AC
  Administered 2016-12-21: 300 mg via INTRAVENOUS
  Filled 2016-12-21: qty 14.29

## 2016-12-21 MED ORDER — DEXAMETHASONE SODIUM PHOSPHATE 10 MG/ML IJ SOLN: 10.0000 mg | Freq: Once | INTRAMUSCULAR | Status: DC

## 2016-12-21 MED ORDER — ONDANSETRON HCL 4 MG/2ML IJ SOLN: 8.0000 mg | Freq: Once | INTRAMUSCULAR | Status: DC

## 2016-12-21 NOTE — Assessment & Plan Note (Addendum)
Left breast biopsy02/20/2018:8:00 retroareolar position: IDC, high-grade with DCIS high-grade, ER 90%, PR 95%, Ki-67 20%, HER-2 positive ratio 3.4; screening detected left breast distortion LIQ 8 8:00 retroareolar 0.8 cm, axilla negative, T1b N0 stage IA clinical stage Left lumpectomy 09/22/2016: IDC grade 3, 1.3 cm, DCIS high-grade, margins clear, 0/1 lymph node negative, T1 CN 0 stage IA, ER 90%, PR 95%, Ki-67 20%, HER-2 positive ratio 3.4  Recommendation: 1. Adjuvant chemotherapy and Herceptin with weekly Taxol and Herceptin 12 followed by Herceptin maintenance for 1 year 3. Followed by adjuvant radiation 4. Followed by adjuvant antiestrogen therapy with letrozole 2.5 mg daily 5 years ----------------------------------------------------------------------------- Current Treatment: Taxol Herceptin Cycle 10 Chemotherapy toxicities:  1. Bone and muscle pain 2. Fatigue 3.Maculopapular skin rash in the chest arms and face: this is improving, will continue to monitor.   4. Peripheral neuropathy: intermittent and very mild.  Will closely monitor, may need to stop taxol early if it worsens  Diabetes with elevated blood sugars: We are carefully monitoring them, they are elevated, and after review of concern with [place], she wants to wait until she finished chemotherapy to discuss an intervention of her sugars.  I sent in supplies today so she could at least start checking them.   Labs reviewed Monitoring closely for chemotherapy toxicities  RTC in 2week for tox check and for cycle 12. I will set her up for adjuvant radiation therapy after the cycle.

## 2016-12-21 NOTE — Patient Instructions (Signed)
[facility] Discharge Instructions for Patients Receiving Chemotherapy  Today you received the following chemotherapy agents Taxol and Herceptin.  To help prevent nausea and vomiting after your treatment, we encourage you to take your nausea medication.   If you develop nausea and vomiting that is not controlled by your nausea medication, call the clinic.   BELOW ARE SYMPTOMS THAT SHOULD BE REPORTED IMMEDIATELY:  *FEVER GREATER THAN 100.5 F  *CHILLS WITH OR WITHOUT FEVER  NAUSEA AND VOMITING THAT IS NOT CONTROLLED WITH YOUR NAUSEA MEDICATION  *UNUSUAL SHORTNESS OF BREATH  *UNUSUAL BRUISING OR BLEEDING  TENDERNESS IN MOUTH AND THROAT WITH OR WITHOUT PRESENCE OF ULCERS  *URINARY PROBLEMS  *BOWEL PROBLEMS  UNUSUAL RASH Items with * indicate a potential emergency and should be followed up as soon as possible.  Feel free to call the clinic you have any questions or concerns. The clinic phone number is [phone].  Please show the [facility] at check-in to the Emergency Department and triage nurse.

## 2016-12-21 NOTE — Progress Notes (Signed)
[facility] Follow up:    Sandra Lima, MD [address] [place] [address]   DIAGNOSIS: Cancer Staging Malignant neoplasm of lower-inner quadrant of left breast in female, estrogen receptor positive ([place] [facility]) Staging form: Breast, AJCC 8th Edition - Clinical stage from 08/26/2016: Stage IA (cT1b, cN0, cM0, G3, ER: Positive, PR: Positive, HER2: Positive) - Unsigned   SUMMARY OF ONCOLOGIC HISTORY:   Malignant neoplasm of lower-inner quadrant of left breast in female, estrogen receptor positive ([facility])   08/18/2016 Initial Diagnosis    Left breast biopsy 8:00 retroareolar position: IDC, high-grade with DCIS high-grade, ER 90%, PR 95%, Ki-67 20%, HER-2 positive ratio 3.4; screening detected left breast distortion LIQ 8 8:00 retroareolar 0.8 cm, axilla negative, T1b N0 stage IA clinical stage      09/22/2016 Surgery    Left lumpectomy: IDC grade 3, 1.3 cm, DCIS high-grade, margins clear, 0/1 lymph node negative, T1CN 0 stage IA, ER 90%, PR 95%, Ki-67 20%, HER-2 positive ratio 3.4      10/19/2016 -  Chemotherapy    Taxol Herceptin weekly 12 followed by Herceptin maintenance every 3 weeks for 1 year        CURRENT THERAPY: Taxol and Herceptin week 10  INTERVAL HISTORY: Sandra Wood 61 y.o. female returns for evaluation prior to receiving chemotherapy.  She is having intermittent neuropathy in her fingertips and toes.  It is not bothering her.  She has not lost any motor abilities in her fingers or toes.  Last echo 12/16/2016 demonstrated LVEF 60-65%.  She is having slightly elevated blood sugars, and she is not checking her blood sugars at home.     Patient Active Problem List   Diagnosis Date Noted  . Port catheter in place 11/02/2016  . Malignant neoplasm of lower-inner quadrant of left breast in female, estrogen receptor positive ([facility]) 08/20/2016  . Depression, recurrent ([facility]) 07/28/2016  . Routine general medical examination at a health  care facility 06/02/2012  . Abnormal Pap smear of cervix 06/02/2012  . Other screening mammogram 06/02/2012  . Allergic rhinitis, cause unspecified 05/04/2012  . Type II or unspecified type diabetes mellitus without mention of complication, uncontrolled 04/03/2012  . Obesity, morbid ([facility]) 03/31/2012  . Asthma, mild persistent 01/27/2012  . GERD (gastroesophageal reflux disease) 12/30/2011  . Preventative health care 12/29/2011    is allergic to no known allergies.  MEDICAL HISTORY: Past Medical History:  Diagnosis Date  . Abnormal Pap smear of cervix 2009  . Allergic rhinitis, cause unspecified   . Arthritis   . Arthritis of ankle BILATERAL / HX FX'S  . Asthma, mild persistent   . Dyspnea   . Fibroids 2008  . GERD (gastroesophageal reflux disease)   . HSV-2 (herpes simplex virus 2) infection    at age 62  . Malignant neoplasm of lower-inner quadrant of left breast in female, estrogen receptor positive ([facility]) 08/20/2016  . Obesity, morbid ([facility])   . Pneumonia   . Tear of medial meniscus of knee LEFT  . Type II or unspecified type diabetes mellitus without mention of complication, uncontrolled     SURGICAL HISTORY: Past Surgical History:  Procedure Laterality Date  . BREAST LUMPECTOMY WITH RADIOACTIVE SEED AND SENTINEL LYMPH NODE BIOPSY Left 09/22/2016   Procedure: BREAST LUMPECTOMY WITH RADIOACTIVE SEED AND LEFT AXILLARY SENTINEL LYMPH NODE BIOPSY;  Surgeon: Alphonsa Overall, MD;  Location: [facility];  Service: General;  Laterality: Left;  . CHONDROPLASTY  08/19/2011   Procedure:  CHONDROPLASTY;  Surgeon: Magnus Sinning, MD;  Location: [facility];  Service: Orthopedics;;  shaving of medial femeral chondral  . DILATION AND CURETTAGE OF UTERUS    . HYSTEROSCOPY W/D&C  2008   benign endometrial polyp - Dr. Maryelizabeth Rowan  . KNEE ARTHROSCOPY W/ MENISCECTOMY  07-07-2010  . MENISECTOMY  08/19/2011   Procedure: MENISECTOMY;  Surgeon: Magnus Sinning, MD;  Location: [facility];  Service: Orthopedics;;  partial lateral menisectomy  . PORTACATH PLACEMENT N/A 09/22/2016   Procedure: INSERTION PORT-A-CATH;  Surgeon: Alphonsa Overall, MD;  Location: [facility];  Service: General;  Laterality: N/A;  . TONSILLECTOMY    . TUBAL LIGATION  AGE 41    SOCIAL HISTORY: Social History   Social History  . Marital status: Divorced    Spouse name: N/A  . Number of children: N/A  . Years of education: N/A   Occupational History  . Not on file.   Social History Main Topics  . Smoking status: Former Smoker    Years: 5.00    Types: Cigarettes    Quit date: 08/13/1986  . Smokeless tobacco: Never Used  . Alcohol use Yes     Comment: OCC.  . Drug use: No  . Sexual activity: Not Currently    Birth control/ protection: Post-menopausal, Abstinence   Other Topics Concern  . Not on file   Social History Narrative  . No narrative on file    FAMILY HISTORY: Family History  Problem Relation Age of Onset  . Diabetes Mother   . Heart disease Father   . Cancer Brother 73       prostate  . Breast cancer Maternal Grandmother 88    Review of Systems  Constitutional: Negative for appetite change, chills, fatigue, fever and unexpected weight change.  HENT:   Negative for hearing loss and lump/mass.   Eyes: Negative for eye problems and icterus.  Respiratory: Negative for chest tightness, cough and shortness of breath.   Cardiovascular: Negative for chest pain, leg swelling and palpitations.  Gastrointestinal: Negative for abdominal distention and abdominal pain.  Endocrine: Negative for hot flashes.  Genitourinary: Negative for difficulty urinating and frequency.   Musculoskeletal: Negative for arthralgias.  Skin: Negative for itching.  Neurological: Negative for dizziness, extremity weakness and numbness.  Hematological: Negative for adenopathy. Does not bruise/bleed easily.  Psychiatric/Behavioral: Negative for depression. The patient is not nervous/anxious.        PHYSICAL EXAMINATION  ECOG PERFORMANCE STATUS: 1 - Symptomatic but completely ambulatory  Vitals:   12/21/16 1045 12/21/16 1049  BP: 129/68 (!) 148/63  Pulse: 69 94  Resp: 18 18  Temp: 98.1 F (36.7 C) 98.2 F (36.8 C)    Physical Exam  Constitutional: She is oriented to person, place, and time and well-developed, well-nourished, and in no distress.  HENT:  Head: Normocephalic and atraumatic.  Mouth/Throat: No oropharyngeal exudate.  Eyes: Pupils are equal, round, and reactive to light. No scleral icterus.  Neck: Neck supple.  Cardiovascular: Normal rate, regular rhythm and normal heart sounds.   Pulmonary/Chest: Effort normal and breath sounds normal. No respiratory distress. She has no wheezes.  Abdominal: Soft. Bowel sounds are normal.  Musculoskeletal: She exhibits no edema.  Lymphadenopathy:    She has no cervical adenopathy.  Neurological: She is alert and oriented to person, place, and time.  Skin: Skin is warm and dry. No rash noted.  Psychiatric: Mood and affect normal.    LABORATORY DATA:  CBC  Component Value Date/Time   WBC 9.5 12/21/2016 1001   WBC 14.2 (H) 09/15/2016 1255   RBC 4.45 12/21/2016 1001   RBC 5.28 (H) 09/15/2016 1255   HGB 12.8 12/21/2016 1001   HCT 38.7 12/21/2016 1001   PLT 348 12/21/2016 1001   MCV 86.9 12/21/2016 1001   MCH 28.8 12/21/2016 1001   MCH 27.3 09/15/2016 1255   MCHC 33.1 12/21/2016 1001   MCHC 31.9 09/15/2016 1255   RDW 18.7 (H) 12/21/2016 1001   LYMPHSABS 2.0 12/21/2016 1001   MONOABS 0.7 12/21/2016 1001   EOSABS 0.2 12/21/2016 1001   BASOSABS 0.1 12/21/2016 1001    CMP     Component Value Date/Time   NA 139 12/21/2016 1001   K 3.8 12/21/2016 1001   CL 103 09/15/2016 1255   CO2 24 12/21/2016 1001   GLUCOSE 233 (H) 12/21/2016 1001   BUN 13.9 12/21/2016 1001   CREATININE 0.8 12/21/2016 1001   CALCIUM 9.1 12/21/2016 1001   PROT 6.6 12/21/2016 1001   ALBUMIN 3.1 (L) 12/21/2016 1001   AST 13 12/21/2016  1001   ALT 25 12/21/2016 1001   ALKPHOS 126 12/21/2016 1001   BILITOT 0.59 12/21/2016 1001   GFRNONAA >60 09/15/2016 1255   GFRAA >60 09/15/2016 1255           ASSESSMENT and PLAN:   Malignant neoplasm of lower-inner quadrant of left breast in female, estrogen receptor positive ([facility]) Left breast biopsy02/20/2018:8:00 retroareolar position: IDC, high-grade with DCIS high-grade, ER 90%, PR 95%, Ki-67 20%, HER-2 positive ratio 3.4; screening detected left breast distortion LIQ 8 8:00 retroareolar 0.8 cm, axilla negative, T1b N0 stage IA clinical stage Left lumpectomy 09/22/2016: IDC grade 3, 1.3 cm, DCIS high-grade, margins clear, 0/1 lymph node negative, T1 CN 0 stage IA, ER 90%, PR 95%, Ki-67 20%, HER-2 positive ratio 3.4  Recommendation: 1. Adjuvant chemotherapy and Herceptin with weekly Taxol and Herceptin 12 followed by Herceptin maintenance for 1 year 3. Followed by adjuvant radiation 4. Followed by adjuvant antiestrogen therapy with letrozole 2.5 mg daily 5 years ----------------------------------------------------------------------------- Current Treatment: Taxol Herceptin Cycle 10 Chemotherapy toxicities:  1. Bone and muscle pain 2. Fatigue 3.Maculopapular skin rash in the chest arms and face: this is improving, will continue to monitor.   4. Peripheral neuropathy: intermittent and very mild.  Will closely monitor, may need to stop taxol early if it worsens  Diabetes with elevated blood sugars: We are carefully monitoring them, they are elevated, and after review of concern with [place], she wants to wait until she finished chemotherapy to discuss an intervention of her sugars.  I sent in supplies today so she could at least start checking them.   Labs reviewed Monitoring closely for chemotherapy toxicities  RTC in 2week for tox check and for cycle 12. I will set her up for adjuvant radiation therapy after the cycle.     All questions were answered. The  patient knows to call the clinic with any problems, questions or concerns. We can certainly see the patient much sooner if necessary.  A total of (30) minutes of face-to-face time was spent with this patient with greater than 50% of that time in counseling and care-coordination.  This note was electronically signed. Scot Dock, NP 12/21/2016

## 2016-12-21 NOTE — Patient Instructions (Signed)

## 2016-12-28 ENCOUNTER — Other Ambulatory Visit (HOSPITAL_BASED_OUTPATIENT_CLINIC_OR_DEPARTMENT_OTHER): Payer: No Typology Code available for payment source

## 2016-12-28 ENCOUNTER — Encounter: Payer: Self-pay | Admitting: *Deleted

## 2016-12-28 ENCOUNTER — Ambulatory Visit: Payer: No Typology Code available for payment source

## 2016-12-28 ENCOUNTER — Encounter: Payer: Self-pay | Admitting: Radiation Oncology

## 2016-12-28 ENCOUNTER — Other Ambulatory Visit: Payer: Self-pay | Admitting: *Deleted

## 2016-12-28 ENCOUNTER — Ambulatory Visit (HOSPITAL_BASED_OUTPATIENT_CLINIC_OR_DEPARTMENT_OTHER): Payer: No Typology Code available for payment source

## 2016-12-28 VITALS — BP 112/62 | HR 83 | Temp 98.5°F | Resp 18

## 2016-12-28 DIAGNOSIS — Z170 Estrogen receptor positive status [ER+]: Principal | ICD-10-CM

## 2016-12-28 DIAGNOSIS — Z5111 Encounter for antineoplastic chemotherapy: Secondary | ICD-10-CM | POA: Diagnosis not present

## 2016-12-28 DIAGNOSIS — C50312 Malignant neoplasm of lower-inner quadrant of left female breast: Secondary | ICD-10-CM

## 2016-12-28 DIAGNOSIS — Z95828 Presence of other vascular implants and grafts: Secondary | ICD-10-CM

## 2016-12-28 DIAGNOSIS — Z5112 Encounter for antineoplastic immunotherapy: Secondary | ICD-10-CM

## 2016-12-28 DIAGNOSIS — R350 Frequency of micturition: Secondary | ICD-10-CM

## 2016-12-28 LAB — CBC WITH DIFFERENTIAL/PLATELET
BASO%: 0.6 % (ref 0.0–2.0)
BASOS ABS: 0.1 10*3/uL (ref 0.0–0.1)
EOS ABS: 0.1 10*3/uL (ref 0.0–0.5)
EOS%: 1.2 % (ref 0.0–7.0)
HCT: 38.8 % (ref 34.8–46.6)
HEMOGLOBIN: 12.8 g/dL (ref 11.6–15.9)
LYMPH%: 20.8 % (ref 14.0–49.7)
MCH: 28.6 pg (ref 25.1–34.0)
MCHC: 33 g/dL (ref 31.5–36.0)
MCV: 86.9 fL (ref 79.5–101.0)
MONO#: 0.8 10*3/uL (ref 0.1–0.9)
MONO%: 7.1 % (ref 0.0–14.0)
NEUT#: 7.9 10*3/uL — ABNORMAL HIGH (ref 1.5–6.5)
NEUT%: 70.3 % (ref 38.4–76.8)
Platelets: 348 10*3/uL (ref 145–400)
RBC: 4.47 10*6/uL (ref 3.70–5.45)
RDW: 19.5 % — ABNORMAL HIGH (ref 11.2–14.5)
WBC: 11.2 10*3/uL — ABNORMAL HIGH (ref 3.9–10.3)
lymph#: 2.3 10*3/uL (ref 0.9–3.3)

## 2016-12-28 LAB — COMPREHENSIVE METABOLIC PANEL
ALBUMIN: 3.3 g/dL — AB (ref 3.5–5.0)
ALK PHOS: 131 U/L (ref 40–150)
ALT: 27 U/L (ref 0–55)
AST: 14 U/L (ref 5–34)
Anion Gap: 11 mEq/L (ref 3–11)
BUN: 11.9 mg/dL (ref 7.0–26.0)
CO2: 24 mEq/L (ref 22–29)
Calcium: 9.4 mg/dL (ref 8.4–10.4)
Chloride: 107 mEq/L (ref 98–109)
Creatinine: 0.7 mg/dL (ref 0.6–1.1)
EGFR: 90 mL/min/{1.73_m2} — AB (ref >90)
GLUCOSE: 161 mg/dL — AB (ref 70–140)
POTASSIUM: 4 meq/L (ref 3.5–5.1)
SODIUM: 143 meq/L (ref 136–145)
Total Bilirubin: 0.58 mg/dL (ref 0.20–1.20)
Total Protein: 6.8 g/dL (ref 6.4–8.3)

## 2016-12-28 LAB — URINALYSIS, MICROSCOPIC - CHCC
BILIRUBIN (URINE): NEGATIVE
GLUCOSE UR CHCC: NEGATIVE mg/dL
Ketones: NEGATIVE mg/dL
NITRITE: NEGATIVE
PH: 5 (ref 4.6–8.0)
Specific Gravity, Urine: 1.025 (ref 1.003–1.035)
UROBILINOGEN UR: 0.2 mg/dL (ref 0.2–1)

## 2016-12-28 LAB — TECHNOLOGIST REVIEW

## 2016-12-28 MED ORDER — TRASTUZUMAB CHEMO 150 MG IV SOLR
300.0000 mg | Freq: Once | INTRAVENOUS | Status: AC
  Administered 2016-12-28: 300 mg via INTRAVENOUS
  Filled 2016-12-28: qty 14.29

## 2016-12-28 MED ORDER — FAMOTIDINE IN NACL 20-0.9 MG/50ML-% IV SOLN
20.0000 mg | Freq: Once | INTRAVENOUS | Status: AC
  Administered 2016-12-28: 20 mg via INTRAVENOUS

## 2016-12-28 MED ORDER — SODIUM CHLORIDE 0.9 % IV SOLN
Freq: Once | INTRAVENOUS | Status: AC
  Administered 2016-12-28: 12:00:00 via INTRAVENOUS

## 2016-12-28 MED ORDER — SODIUM CHLORIDE 0.9% FLUSH
10.0000 mL | INTRAVENOUS | Status: DC | PRN
  Administered 2016-12-28: 10 mL via INTRAVENOUS
  Filled 2016-12-28: qty 10

## 2016-12-28 MED ORDER — ACETAMINOPHEN 325 MG PO TABS
ORAL_TABLET | ORAL | Status: AC
  Filled 2016-12-28: qty 2

## 2016-12-28 MED ORDER — DIPHENHYDRAMINE HCL 50 MG/ML IJ SOLN
50.0000 mg | Freq: Once | INTRAMUSCULAR | Status: AC
  Administered 2016-12-28: 50 mg via INTRAVENOUS

## 2016-12-28 MED ORDER — ACETAMINOPHEN 325 MG PO TABS
650.0000 mg | ORAL_TABLET | Freq: Once | ORAL | Status: AC
  Administered 2016-12-28: 650 mg via ORAL

## 2016-12-28 MED ORDER — FAMOTIDINE IN NACL 20-0.9 MG/50ML-% IV SOLN
INTRAVENOUS | Status: AC
  Filled 2016-12-28: qty 50

## 2016-12-28 MED ORDER — SODIUM CHLORIDE 0.9 % IV SOLN
65.0000 mg/m2 | Freq: Once | INTRAVENOUS | Status: AC
  Administered 2016-12-28: 168 mg via INTRAVENOUS
  Filled 2016-12-28: qty 28

## 2016-12-28 MED ORDER — SODIUM CHLORIDE 0.9% FLUSH
10.0000 mL | INTRAVENOUS | Status: DC | PRN
  Administered 2016-12-28: 10 mL
  Filled 2016-12-28: qty 10

## 2016-12-28 MED ORDER — DEXAMETHASONE SODIUM PHOSPHATE 100 MG/10ML IJ SOLN
Freq: Once | INTRAMUSCULAR | Status: AC
  Administered 2016-12-28: 13:00:00 via INTRAVENOUS
  Filled 2016-12-28: qty 4

## 2016-12-28 MED ORDER — TRASTUZUMAB CHEMO 150 MG IV SOLR: 2.0000 mg/kg | Freq: Once | INTRAVENOUS | Status: DC

## 2016-12-28 MED ORDER — DIPHENHYDRAMINE HCL 50 MG/ML IJ SOLN
INTRAMUSCULAR | Status: AC
  Filled 2016-12-28: qty 1

## 2016-12-28 MED ORDER — HEPARIN SOD (PORK) LOCK FLUSH 100 UNIT/ML IV SOLN
500.0000 [IU] | Freq: Once | INTRAVENOUS | Status: AC | PRN
  Administered 2016-12-28: 500 [IU]
  Filled 2016-12-28: qty 5

## 2016-12-28 NOTE — Progress Notes (Signed)
Patient reported some "pain and pressure and a little burning" with urination. UA had no abnormal values.  Alerted desk RN to alert MD, instructed patient to take AZO OTC and cranberry supplements and hydrate well.   Wylene Simmer, BSN, RN 12/28/2016 12:03 PM

## 2016-12-28 NOTE — Patient Instructions (Signed)
[facility] Discharge Instructions for Patients Receiving Chemotherapy  Today you received the following chemotherapy agents Taxol and Herceptin.  To help prevent nausea and vomiting after your treatment, we encourage you to take your nausea medication.   If you develop nausea and vomiting that is not controlled by your nausea medication, call the clinic.   BELOW ARE SYMPTOMS THAT SHOULD BE REPORTED IMMEDIATELY:  *FEVER GREATER THAN 100.5 F  *CHILLS WITH OR WITHOUT FEVER  NAUSEA AND VOMITING THAT IS NOT CONTROLLED WITH YOUR NAUSEA MEDICATION  *UNUSUAL SHORTNESS OF BREATH  *UNUSUAL BRUISING OR BLEEDING  TENDERNESS IN MOUTH AND THROAT WITH OR WITHOUT PRESENCE OF ULCERS  *URINARY PROBLEMS  *BOWEL PROBLEMS  UNUSUAL RASH Items with * indicate a potential emergency and should be followed up as soon as possible.  Feel free to call the clinic you have any questions or concerns. The clinic phone number is [phone].  Please show the [facility] at check-in to the Emergency Department and triage nurse.

## 2016-12-31 ENCOUNTER — Other Ambulatory Visit: Payer: Self-pay

## 2016-12-31 LAB — URINE CULTURE

## 2016-12-31 MED ORDER — LEVOFLOXACIN 500 MG PO TABS: 500.0000 mg | ORAL_TABLET | Freq: Every day | ORAL | 0 refills | Status: DC

## 2016-12-31 NOTE — Progress Notes (Signed)
Called pt to notify her of her final urine culture. Per Dr.Gudena, ordered pt Levaquin 500mg  daily x7days. Advised pt to drink with plenty of water and take with food. Pt verbalized understanding and reports feeling much improved compared to her symptoms a few days ago. No further questions or concerns at this time. Pt appreciates the call. Sent to [facility].

## 2017-01-04 ENCOUNTER — Other Ambulatory Visit (HOSPITAL_BASED_OUTPATIENT_CLINIC_OR_DEPARTMENT_OTHER): Payer: No Typology Code available for payment source

## 2017-01-04 ENCOUNTER — Other Ambulatory Visit: Payer: Self-pay | Admitting: Hematology and Oncology

## 2017-01-04 ENCOUNTER — Ambulatory Visit (HOSPITAL_BASED_OUTPATIENT_CLINIC_OR_DEPARTMENT_OTHER): Payer: No Typology Code available for payment source

## 2017-01-04 ENCOUNTER — Encounter: Payer: Self-pay | Admitting: *Deleted

## 2017-01-04 ENCOUNTER — Ambulatory Visit: Payer: No Typology Code available for payment source

## 2017-01-04 ENCOUNTER — Ambulatory Visit (HOSPITAL_BASED_OUTPATIENT_CLINIC_OR_DEPARTMENT_OTHER): Payer: No Typology Code available for payment source | Admitting: Hematology and Oncology

## 2017-01-04 DIAGNOSIS — Z5112 Encounter for antineoplastic immunotherapy: Secondary | ICD-10-CM | POA: Diagnosis not present

## 2017-01-04 DIAGNOSIS — Z5111 Encounter for antineoplastic chemotherapy: Secondary | ICD-10-CM

## 2017-01-04 DIAGNOSIS — G620 Drug-induced polyneuropathy: Secondary | ICD-10-CM

## 2017-01-04 DIAGNOSIS — C50312 Malignant neoplasm of lower-inner quadrant of left female breast: Secondary | ICD-10-CM

## 2017-01-04 DIAGNOSIS — Z170 Estrogen receptor positive status [ER+]: Principal | ICD-10-CM

## 2017-01-04 DIAGNOSIS — Z95828 Presence of other vascular implants and grafts: Secondary | ICD-10-CM

## 2017-01-04 LAB — TECHNOLOGIST REVIEW: Technologist Review: 2

## 2017-01-04 LAB — COMPREHENSIVE METABOLIC PANEL
ALK PHOS: 122 U/L (ref 40–150)
ALT: 23 U/L (ref 0–55)
ANION GAP: 9 meq/L (ref 3–11)
AST: 12 U/L (ref 5–34)
Albumin: 3 g/dL — ABNORMAL LOW (ref 3.5–5.0)
BILIRUBIN TOTAL: 0.52 mg/dL (ref 0.20–1.20)
BUN: 15.7 mg/dL (ref 7.0–26.0)
CALCIUM: 9 mg/dL (ref 8.4–10.4)
CO2: 24 meq/L (ref 22–29)
CREATININE: 0.8 mg/dL (ref 0.6–1.1)
Chloride: 107 mEq/L (ref 98–109)
EGFR: 83 mL/min/{1.73_m2} — AB (ref >90)
Glucose: 236 mg/dl — ABNORMAL HIGH (ref 70–140)
Potassium: 4.1 mEq/L (ref 3.5–5.1)
Sodium: 140 mEq/L (ref 136–145)
TOTAL PROTEIN: 6.4 g/dL (ref 6.4–8.3)

## 2017-01-04 LAB — CBC WITH DIFFERENTIAL/PLATELET
BASO%: 0.6 % (ref 0.0–2.0)
Basophils Absolute: 0.1 10*3/uL (ref 0.0–0.1)
EOS ABS: 0.2 10*3/uL (ref 0.0–0.5)
EOS%: 2.1 % (ref 0.0–7.0)
HEMATOCRIT: 39.1 % (ref 34.8–46.6)
HGB: 12.5 g/dL (ref 11.6–15.9)
LYMPH#: 2.4 10*3/uL (ref 0.9–3.3)
LYMPH%: 25.1 % (ref 14.0–49.7)
MCH: 28.7 pg (ref 25.1–34.0)
MCHC: 32 g/dL (ref 31.5–36.0)
MCV: 89.7 fL (ref 79.5–101.0)
MONO#: 0.8 10*3/uL (ref 0.1–0.9)
MONO%: 7.9 % (ref 0.0–14.0)
NEUT%: 64.3 % (ref 38.4–76.8)
NEUTROS ABS: 6.2 10*3/uL (ref 1.5–6.5)
PLATELETS: 309 10*3/uL (ref 145–400)
RBC: 4.36 10*6/uL (ref 3.70–5.45)
RDW: 18.5 % — ABNORMAL HIGH (ref 11.2–14.5)
WBC: 9.6 10*3/uL (ref 3.9–10.3)

## 2017-01-04 MED ORDER — DIPHENHYDRAMINE HCL 50 MG/ML IJ SOLN
INTRAMUSCULAR | Status: AC
  Filled 2017-01-04: qty 1

## 2017-01-04 MED ORDER — DIPHENHYDRAMINE HCL 50 MG/ML IJ SOLN
50.0000 mg | Freq: Once | INTRAMUSCULAR | Status: AC
Start: 2017-01-04 — End: 2017-01-04
  Administered 2017-01-04: 50 mg via INTRAVENOUS

## 2017-01-04 MED ORDER — SODIUM CHLORIDE 0.9% FLUSH
10.0000 mL | INTRAVENOUS | Status: DC | PRN
  Administered 2017-01-04: 10 mL via INTRAVENOUS
  Filled 2017-01-04: qty 10

## 2017-01-04 MED ORDER — SODIUM CHLORIDE 0.9 % IV SOLN
900.0000 mg | Freq: Once | INTRAVENOUS | Status: AC
  Administered 2017-01-04: 900 mg via INTRAVENOUS
  Filled 2017-01-04: qty 42.86

## 2017-01-04 MED ORDER — HEPARIN SOD (PORK) LOCK FLUSH 100 UNIT/ML IV SOLN
500.0000 [IU] | Freq: Once | INTRAVENOUS | Status: AC | PRN
  Administered 2017-01-04: 500 [IU]
  Filled 2017-01-04: qty 5

## 2017-01-04 MED ORDER — FAMOTIDINE IN NACL 20-0.9 MG/50ML-% IV SOLN
20.0000 mg | Freq: Once | INTRAVENOUS | Status: AC
  Administered 2017-01-04: 20 mg via INTRAVENOUS

## 2017-01-04 MED ORDER — SODIUM CHLORIDE 0.9 % IV SOLN
Freq: Once | INTRAVENOUS | Status: AC
  Administered 2017-01-04: 11:00:00 via INTRAVENOUS

## 2017-01-04 MED ORDER — TRASTUZUMAB CHEMO 150 MG IV SOLR: 6.0000 mg/kg | Freq: Once | INTRAVENOUS | Status: DC

## 2017-01-04 MED ORDER — SODIUM CHLORIDE 0.9 % IV SOLN
Freq: Once | INTRAVENOUS | Status: AC
  Administered 2017-01-04: 12:00:00 via INTRAVENOUS
  Filled 2017-01-04: qty 4

## 2017-01-04 MED ORDER — SODIUM CHLORIDE 0.9% FLUSH
10.0000 mL | INTRAVENOUS | Status: DC | PRN
  Administered 2017-01-04: 10 mL
  Filled 2017-01-04: qty 10

## 2017-01-04 MED ORDER — FAMOTIDINE IN NACL 20-0.9 MG/50ML-% IV SOLN
INTRAVENOUS | Status: AC
  Filled 2017-01-04: qty 50

## 2017-01-04 MED ORDER — ACETAMINOPHEN 325 MG PO TABS
ORAL_TABLET | ORAL | Status: AC
  Filled 2017-01-04: qty 2

## 2017-01-04 MED ORDER — DIPHENHYDRAMINE HCL 25 MG PO CAPS: 50.0000 mg | ORAL_CAPSULE | Freq: Once | ORAL | Status: DC

## 2017-01-04 MED ORDER — SODIUM CHLORIDE 0.9 % IV SOLN
65.0000 mg/m2 | Freq: Once | INTRAVENOUS | Status: AC
  Administered 2017-01-04: 168 mg via INTRAVENOUS
  Filled 2017-01-04: qty 28

## 2017-01-04 MED ORDER — ACETAMINOPHEN 325 MG PO TABS
650.0000 mg | ORAL_TABLET | Freq: Once | ORAL | Status: AC
  Administered 2017-01-04: 650 mg via ORAL

## 2017-01-04 NOTE — Assessment & Plan Note (Signed)
Left breast biopsy02/20/2018:8:00 retroareolar position: IDC, high-grade with DCIS high-grade, ER 90%, PR 95%, Ki-67 20%, HER-2 positive ratio 3.4; screening detected left breast distortion LIQ 8 8:00 retroareolar 0.8 cm, axilla negative, T1b N0 stage IA clinical stage Left lumpectomy 09/22/2016: IDC grade 3, 1.3 cm, DCIS high-grade, margins clear, 0/1 lymph node negative, T1 CN 0 stage IA, ER 90%, PR 95%, Ki-67 20%, HER-2 positive ratio 3.4  Recommendation: 1. Adjuvant chemotherapy and Herceptin with weekly Taxol and Herceptin 12 completed 01/04/2017 followed by Herceptin maintenance for 1 year 3. Followed by adjuvant radiation  4. Followed by adjuvant antiestrogen therapy with letrozole 2.5 mg daily 5 years ----------------------------------------------------------------------------- Current Treatment: Taxol Herceptin Cycle 10 Chemotherapy toxicities:  1. Bone and muscle pain 2. Fatigue 3.Maculopapular skin rash in the chest arms and face: this is improving, will continue to monitor.   4. Peripheral neuropathy: intermittent and very mild.  Will closely monitor, may need to stop taxol early if it worsens  Diabetes with elevated blood sugars:

## 2017-01-04 NOTE — Patient Instructions (Signed)
[facility] Discharge Instructions for Patients Receiving Chemotherapy  Today you received the following chemotherapy agents Herceptin and Taxol  To help prevent nausea and vomiting after your treatment, we encourage you to take your nausea medication as directed  If you develop nausea and vomiting that is not controlled by your nausea medication, call the clinic.   BELOW ARE SYMPTOMS THAT SHOULD BE REPORTED IMMEDIATELY:  *FEVER GREATER THAN 100.5 F  *CHILLS WITH OR WITHOUT FEVER  NAUSEA AND VOMITING THAT IS NOT CONTROLLED WITH YOUR NAUSEA MEDICATION  *UNUSUAL SHORTNESS OF BREATH  *UNUSUAL BRUISING OR BLEEDING  TENDERNESS IN MOUTH AND THROAT WITH OR WITHOUT PRESENCE OF ULCERS  *URINARY PROBLEMS  *BOWEL PROBLEMS  UNUSUAL RASH Items with * indicate a potential emergency and should be followed up as soon as possible.  Feel free to call the clinic you have any questions or concerns. The clinic phone number is [phone].  Please show the [facility] at check-in to the Emergency Department and triage nurse.

## 2017-01-04 NOTE — Progress Notes (Signed)
Patient Care Team: Janith Lima, MD as PCP - General (Internal Medicine) Alphonsa Overall, MD as Consulting Physician (General Surgery) Nicholas Lose, MD as Consulting Physician (Hematology and Oncology) Eppie Gibson, MD as Attending Physician (Radiation Oncology)  DIAGNOSIS:  Encounter Diagnosis  Name Primary?  . Malignant neoplasm of lower-inner quadrant of left breast in female, estrogen receptor positive ([place])     SUMMARY OF ONCOLOGIC HISTORY:   Malignant neoplasm of lower-inner quadrant of left breast in female, estrogen receptor positive ([place])   08/18/2016 Initial Diagnosis    Left breast biopsy 8:00 retroareolar position: IDC, high-grade with DCIS high-grade, ER 90%, PR 95%, Ki-67 20%, HER-2 positive ratio 3.4; screening detected left breast distortion LIQ 8 8:00 retroareolar 0.8 cm, axilla negative, T1b N0 stage IA clinical stage      09/22/2016 Surgery    Left lumpectomy: IDC grade 3, 1.3 cm, DCIS high-grade, margins clear, 0/1 lymph node negative, T1CN 0 stage IA, ER 90%, PR 95%, Ki-67 20%, HER-2 positive ratio 3.4      10/19/2016 - 01/04/2017 Chemotherapy    Taxol Herceptin weekly 12 followed by Herceptin maintenance every 3 weeks for 1 year        CHIEF COMPLIANT: Cycle 12 Taxol Herceptin  INTERVAL HISTORY: Sandra Wood is a 61 year old with above-mentioned history left breast cancer treated with lumpectomy and today is her last cycle of chemotherapy with Taxol Herceptin. Overall she tolerated the treatment extremely well. She does not have neuropathy. Does not have nausea vomiting.  REVIEW OF SYSTEMS:   Constitutional: Denies fevers, chills or abnormal weight loss Eyes: Denies blurriness of vision Ears, nose, mouth, throat, and face: Denies mucositis or sore throat Respiratory: Denies cough, dyspnea or wheezes Cardiovascular: Denies palpitation, chest discomfort Gastrointestinal:  Denies nausea, heartburn or change in bowel habits Skin: Denies abnormal  skin rashes Lymphatics: Denies new lymphadenopathy or easy bruising Neurological:Denies numbness, tingling or new weaknesses Behavioral/Psych: Mood is stable, no new changes  Extremities: No lower extremity edema Breast:  denies any pain or lumps or nodules in either breasts All other systems were reviewed with the patient and are negative.  I have reviewed the past medical history, past surgical history, social history and family history with the patient and they are unchanged from previous note.  ALLERGIES:  is allergic to no known allergies.  MEDICATIONS:  Current Outpatient Prescriptions  Medication Sig Dispense Refill  . blood glucose meter kit and supplies KIT Dispense based on patient and insurance preference. Use up to four times daily as directed. (FOR ICD-9 250.00, 250.01). Check blood sugar BID. 1 each 0  . glucose blood test strip Use as instructed 100 each 12  . HYDROcodone-acetaminophen (NORCO/VICODIN) 5-325 MG tablet Take 1-2 tablets by mouth every 6 (six) hours as needed. 20 tablet 0  . ibuprofen (ADVIL,MOTRIN) 200 MG tablet Take 400-600 mg by mouth every 6 (six) hours as needed for headache or mild pain (depends on pain if takes 2-3 tablets).     Marland Kitchen ipratropium-albuterol (DUONEB) 0.5-2.5 (3) MG/3ML SOLN Inhale 3 mLs into the lungs every 4 (four) hours as needed (shortness of breath).     Elmore Guise Devices (ADJUSTABLE LANCING DEVICE) MISC 1 each by Does not apply route continuous. 1 each 0  . Lancets MISC 1 applicator by Does not apply route 2 (two) times daily. 60 each 0  . levofloxacin (LEVAQUIN) 500 MG tablet Take 1 tablet (500 mg total) by mouth daily. 7 tablet 0  . lidocaine-prilocaine (EMLA) cream Apply  to affected area once 30 g 3  . LORazepam (ATIVAN) 0.5 MG tablet Take 1 tablet (0.5 mg total) by mouth every 6 (six) hours as needed (Nausea or vomiting). 30 tablet 0  . ondansetron (ZOFRAN) 8 MG tablet Take 1 tablet (8 mg total) by mouth 2 (two) times daily as needed  (Nausea or vomiting). 30 tablet 1  . PROAIR HFA 108 (90 Base) MCG/ACT inhaler Inhale 2 puffs into the lungs every 6 (six) hours as needed for wheezing or shortness of breath.     . prochlorperazine (COMPAZINE) 10 MG tablet Take 1 tablet (10 mg total) by mouth every 6 (six) hours as needed (Nausea or vomiting). 30 tablet 1  . sertraline (ZOLOFT) 50 MG tablet Take 1 tablet (50 mg total) by mouth daily. (Patient taking differently: Take 50 mg by mouth daily as needed. ) 90 tablet 3   No current facility-administered medications for this visit.     PHYSICAL EXAMINATION: ECOG PERFORMANCE STATUS: 1 - Symptomatic but completely ambulatory  Vitals:   01/04/17 1042  BP: (!) 158/53  Pulse: 97  Resp: 19  Temp: 98.1 F (36.7 C)   Filed Weights   01/04/17 1042  Weight: (!) 330 lb 4.8 oz (149.8 kg)    GENERAL:alert, no distress and comfortable SKIN: skin color, texture, turgor are normal, no rashes or significant lesions EYES: normal, Conjunctiva are pink and non-injected, sclera clear OROPHARYNX:no exudate, no erythema and lips, buccal mucosa, and tongue normal  NECK: supple, thyroid normal size, non-tender, without nodularity LYMPH:  no palpable lymphadenopathy in the cervical, axillary or inguinal LUNGS: clear to auscultation and percussion with normal breathing effort HEART: regular rate & rhythm and no murmurs and no lower extremity edema ABDOMEN:abdomen soft, non-tender and normal bowel sounds MUSCULOSKELETAL:no cyanosis of digits and no clubbing  NEURO: alert & oriented x 3 with fluent speech, no focal motor/sensory deficits EXTREMITIES: No lower extremity edema BREAST: No palpable masses or nodules in either right or left breasts. No palpable axillary supraclavicular or infraclavicular adenopathy no breast tenderness or nipple discharge. (exam performed in the presence of a chaperone)  LABORATORY DATA:  I have reviewed the data as listed   Chemistry      Component Value Date/Time     NA 140 01/04/2017 0956   K 4.1 01/04/2017 0956   CL 103 09/15/2016 1255   CO2 24 01/04/2017 0956   BUN 15.7 01/04/2017 0956   CREATININE 0.8 01/04/2017 0956      Component Value Date/Time   CALCIUM 9.0 01/04/2017 0956   ALKPHOS 122 01/04/2017 0956   AST 12 01/04/2017 0956   ALT 23 01/04/2017 0956   BILITOT 0.52 01/04/2017 0956       Lab Results  Component Value Date   WBC 9.6 01/04/2017   HGB 12.5 01/04/2017   HCT 39.1 01/04/2017   MCV 89.7 01/04/2017   PLT 309 01/04/2017   NEUTROABS 6.2 01/04/2017    ASSESSMENT & PLAN:  Malignant neoplasm of lower-inner quadrant of left breast in female, estrogen receptor positive ([facility]) Left breast biopsy02/20/2018:8:00 retroareolar position: IDC, high-grade with DCIS high-grade, ER 90%, PR 95%, Ki-67 20%, HER-2 positive ratio 3.4; screening detected left breast distortion LIQ 8 8:00 retroareolar 0.8 cm, axilla negative, T1b N0 stage IA clinical stage Left lumpectomy 09/22/2016: IDC grade 3, 1.3 cm, DCIS high-grade, margins clear, 0/1 lymph node negative, T1 CN 0 stage IA, ER 90%, PR 95%, Ki-67 20%, HER-2 positive ratio 3.4  Recommendation: 1. Adjuvant chemotherapy and Herceptin  with weekly Taxol and Herceptin 12  completed 01/04/2017 followed by Herceptin maintenance for 1 year 3. Followed by adjuvant radiation  4. Followed by adjuvant antiestrogen therapy with letrozole 2.5 mg daily 5 years ----------------------------------------------------------------------------- Current Treatment: Taxol Herceptin Cycle 10 Chemotherapy toxicities:  1. Bone and muscle pain 2. Fatigue 3.Maculopapular skin rash in the chest arms and face: this is improving, will continue to monitor.   4. Peripheral neuropathy: intermittent and very mild.  Will closely monitor, may need to stop taxol early if it worsens  Diabetes with elevated blood sugars: Patient was referred to glucometer and she is checking her blood sugars more frequently.  I spent 25  minutes talking to the patient of which more than half was spent in counseling and coordination of care.  No orders of the defined types were placed in this encounter.  The patient has a good understanding of the overall plan. she agrees with it. she will call with any problems that may develop before the next visit here.   Rulon Eisenmenger, MD 01/04/17

## 2017-01-08 NOTE — Progress Notes (Signed)
Radiation Oncology         [phone] ________________________________  Name: Sandra Wood MRN: [id number]  Date: 01/11/2017  DOB: 02-20-1956  Reevaluation Visit Note  CC: Sandra Lima, MD  Sandra Lose, MD  Diagnosis:   Sandra Wood is a 61 y.o. female with pathologic stage pT1c,pN0 high grade invasive ductal carcinoma of the left breast (triple positive).  Narrative:  The patient returns today for a reevaluation.  She had a screening bilateral mammogram on 08/11/16. This showed a possible distortion in the left breast. The patient had a diagnostic mammogram of the left breast on 08/17/16. This showed a mass with an associated distortion measuring approximately 0.9 cm. Physical exam revealed an area of nodularity in the slightly inner/retroareolar left breast. Ultrasound showed an irregular mass at the 8:00 position measuring 0.8 x 0.9 x 0.8 cm corresponding to the mass seen on mammography and additional areas of fibrocystic change/clusters of cyst in the retreoareolar left breast. No lymphadenopathy was seen in the left axilla and the right breast was negative. Biopsy of the LIQ left breast 8:00 retroareolar position revealed high grade IDC and high grade DCIS (ER 90% positive, PR 95% positive, HER2 positive, Ki67 20%).   The patient underwent left breast lumpectomy with radioactive seed and left axillary sentinel lymph node biopsy by Dr. Alphonsa Overall on 09/22/2016.  Surgical pathology revealed invasive ductal carcinoma (Grade III/III, spanning 1.3 cm), ductal carcinoma in situ (high Grade), and invasive carcinoma less than 0.1 cm to the anterior margin.  It also showed no evidence of malignancy in the left inferior margin and left medial margin of the left breast or in one left axillary lymph node.  The patient began Taxol per medical oncology on 10/19/2016, taken weekly x12, followed by Herceptin maintenance every 3 weeks for 6 months .  She finished Taxol last week and reports  having numbness and tingling in her hands and fingers over the last few weeks. She reports the chronic dry skin condition and reports chronic decreased sweat gland production.  The patient presents today to discuss radiation treatment options for the management of her disease.  She is accompanied today by her mother.                    ALLERGIES:  is allergic to no known allergies.  Meds: Current Outpatient Prescriptions  Medication Sig Dispense Refill  . ibuprofen (ADVIL,MOTRIN) 200 MG tablet Take 400-600 mg by mouth every 6 (six) hours as needed for headache or mild pain (depends on pain if takes 2-3 tablets).     . Lancets MISC 1 applicator by Does not apply route 2 (two) times daily. 60 each 0  . lidocaine-prilocaine (EMLA) cream Apply to affected area once 30 g 3  . blood glucose meter kit and supplies KIT Dispense based on patient and insurance preference. Use up to four times daily as directed. (FOR ICD-9 250.00, 250.01). Check blood sugar BID. (Patient not taking: Reported on 01/11/2017) 1 each 0  . glucose blood test strip Use as instructed (Patient not taking: Reported on 01/11/2017) 100 each 12  . HYDROcodone-acetaminophen (NORCO/VICODIN) 5-325 MG tablet Take 1-2 tablets by mouth every 6 (six) hours as needed. (Patient not taking: Reported on 01/11/2017) 20 tablet 0  . ipratropium-albuterol (DUONEB) 0.5-2.5 (3) MG/3ML SOLN Inhale 3 mLs into the lungs every 4 (four) hours as needed (shortness of breath).     Elmore Guise Devices (ADJUSTABLE LANCING DEVICE) MISC 1 each by Does  not apply route continuous. (Patient not taking: Reported on 01/11/2017) 1 each 0  . LORazepam (ATIVAN) 0.5 MG tablet Take 1 tablet (0.5 mg total) by mouth every 6 (six) hours as needed (Nausea or vomiting). (Patient not taking: Reported on 01/11/2017) 30 tablet 0  . ondansetron (ZOFRAN) 8 MG tablet Take 1 tablet (8 mg total) by mouth 2 (two) times daily as needed (Nausea or vomiting). (Patient not taking: Reported on  01/11/2017) 30 tablet 1  . PROAIR HFA 108 (90 Base) MCG/ACT inhaler Inhale 2 puffs into the lungs every 6 (six) hours as needed for wheezing or shortness of breath.     . prochlorperazine (COMPAZINE) 10 MG tablet Take 1 tablet (10 mg total) by mouth every 6 (six) hours as needed (Nausea or vomiting). (Patient not taking: Reported on 01/11/2017) 30 tablet 1  . sertraline (ZOLOFT) 50 MG tablet Take 1 tablet (50 mg total) by mouth daily. (Patient not taking: Reported on 01/11/2017) 90 tablet 3   No current facility-administered medications for this encounter.    REVIEW OF SYSTEMS: A 10+ POINT REVIEW OF SYSTEMS WAS OBTAINED including neurology, dermatology, psychiatry, cardiac, respiratory, lymph, extremities, GI, GU, musculoskeletal, constitutional, reproductive, HEENT. All pertinent positives are noted in the HPI. All others are negative.  Physical Findings: The patient is in no acute distress. Patient is alert and oriented.   height is _0  (1.575 m) and weight is 329 lb 9.6 oz (149.5 kg) (abnormal). Her temperature is 98.8 F (37.1 C). Her blood pressure is 130/71 and her pulse is 91. Her oxygen saturation is 92% Lungs are clear to auscultation bilaterally. Heart has regular rate and rhythm. No palpable cervical, supraclavicular, or axillary adenopathy. Abdomen soft, non-tender, normal bowel sounds. The right breast had no palpable mass or nipple discharge. The patient has a port a cath in place in the right upper chest. The left breast has a scar in the upper outer quadrant from her sentinel node procedure.  There is a second scar in the superior areolar border. There was no palpable mass or nipple discharge.  The patient was noted to have dry skin throughout both breast areas.  Lab Findings: Lab Results  Component Value Date   WBC 9.6 01/04/2017   HGB 12.5 01/04/2017   HCT 39.1 01/04/2017   MCV 89.7 01/04/2017   PLT 309 01/04/2017    Radiographic Findings: No results found.  Impression:   The patient is a 61 y.o. female with with pathologic stage pT1c,pN0 high grade invasive ductal carcinoma of the left breast (triple positive), status post left breast lumpectomy And adjuvant chemotherapy.  The patient would be a good candidate for radiation therapy directed at the left breast, followed by antiestrogen therapy. We discussed the risks, benefits, short, and long term effects of radiotherapy, and the patient is interested in proceeding. I discussed the delivery and logistics of radiotherapy.  The patient signed the radiation consent form and a copy was retained for our records.  Plan:  1) CT simulation next week  2) Treatment will start in two weeks- approximately 6.5 weeks of radiation therapy.  ____________________________________ -----------------------------------  Blair Promise, PhD, MD  This document serves as a record of services personally performed by Gery Pray, MD. It was created on his behalf by Rae Lips, a trained medical scribe. The creation of this record is based on the scribe's personal observations and the provider's statements to them. This document has been checked and approved by the attending provider.

## 2017-01-08 NOTE — Progress Notes (Signed)
Location of Breast Cancer: lower-inner quadrant of left breast  Histology per Pathology Report:   09/22/16 Diagnosis 1. Breast, lumpectomy, Left - INVASIVE DUCTAL CARCINOMA, GRADE III/III, SPANNING 1.3 CM. - DUCTAL CARCINOMA IN SITU, HIGH GRADE. - INVASIVE CARCINOMA IS BROADLY LESS THAN 0.1 CM TO THE ANTERIOR MARGIN OF SPECIMEN #1. - SEE ONCOLOGY TABLE BELOW. 2. Breast, excision, Left Inferior Margin - FIBROCYSTIC CHANGES WITH ADENOSIS AND CALCIFICATIONS. - USUAL DUCTAL HYPERPLASIA. - THERE IS NO EVIDENCE OF MALIGNANCY. - SEE COMMENT. 3. Breast, excision, Left Medial Margin - FIBROCYSTIC CHANGES WITH ADENOSIS AND CALCIFICATIONS. - THERE IS NO EVIDENCE OF MALIGNANCY. - SEE COMMENT. 4. Lymph nodes, regional resection, Left Axillary Contents - THERE IS NO EVIDENCE OF CARCINOMA IN 1 OF 1 LYMPH NODE (0/1).  08/18/16 Diagnosis Breast, left, needle core biopsy, lower inner quadrant, 8:00 o'clock retroareolar - INVASIVE DUCTAL CARCINOMA, HIGH GRADE. - DUCTAL CARCINOMA IN SITU, HIGH GRADE  Receptor Status: ER(90%), PR (95%), Her2-neu (positive), Ki-(20%)  Did patient present with symptoms (if so, please note symptoms) or was this found on screening mammography?: It was found by her GYN on annual exam, and she was referred to the Breast center.   Past/Anticipated interventions by surgeon, if any: 09/22/16 - Procedure: BREAST LUMPECTOMY WITH RADIOACTIVE SEED AND LEFT AXILLARY SENTINEL LYMPH NODE BIOPSY;  Surgeon: Alphonsa Overall, MD  Past/Anticipated interventions by medical oncology, if any: 10/19/16-01/04/17 - Taxol Herceptin weekly 12 followed by Herceptin maintenance every 3 weeks for 1 year  Lymphedema issues, if any: She denies. She has good arm mobility  Pain issues, if any:  She has chronic knee pain which she rates a 2/10 today  SAFETY ISSUES:  Prior radiation? No  Pacemaker/ICD? No  Possible current pregnancy?no  Is the patient on methotrexate? No  Current Complaints / other  details:    BP 130/71   Pulse 91   Temp 98.8 F (37.1 C)   Ht _0  (1.575 m)   Wt (!) 329 lb 9.6 oz (149.5 kg)   LMP 06/30/2003   SpO2 92% Comment: room air  BMI 60.28 kg/m    Wt Readings from Last 3 Encounters:  01/11/17 (!) 329 lb 9.6 oz (149.5 kg)  01/04/17 (!) 330 lb 4.8 oz (149.8 kg)  12/21/16 (!) 330 lb (149.7 kg)      Jacqulyn Liner, RN 01/08/2017,8:18 AM

## 2017-01-11 ENCOUNTER — Encounter: Payer: Self-pay | Admitting: Radiation Oncology

## 2017-01-11 ENCOUNTER — Ambulatory Visit
Admission: RE | Admit: 2017-01-11 | Discharge: 2017-01-11 | Disposition: A | Discharge status: Home / Self Care | Payer: No Typology Code available for payment source | Source: Ambulatory Visit | Attending: Radiation Oncology | Admitting: Radiation Oncology

## 2017-01-11 VITALS — BP 130/71 | HR 91 | Temp 98.8°F | Ht 62.0 in | Wt 329.6 lb

## 2017-01-11 DIAGNOSIS — Z9221 Personal history of antineoplastic chemotherapy: Secondary | ICD-10-CM | POA: Diagnosis not present

## 2017-01-11 DIAGNOSIS — Z170 Estrogen receptor positive status [ER+]: Principal | ICD-10-CM

## 2017-01-11 DIAGNOSIS — C50312 Malignant neoplasm of lower-inner quadrant of left female breast: Secondary | ICD-10-CM

## 2017-01-11 DIAGNOSIS — Z79899 Other long term (current) drug therapy: Secondary | ICD-10-CM | POA: Insufficient documentation

## 2017-01-11 DIAGNOSIS — Z510 Encounter for antineoplastic radiation therapy: Secondary | ICD-10-CM | POA: Insufficient documentation

## 2017-01-11 NOTE — Progress Notes (Signed)
Please see the Nurse Progress Note in the MD Initial Consult Encounter for this patient. 

## 2017-01-19 ENCOUNTER — Ambulatory Visit
Admission: RE | Admit: 2017-01-19 | Discharge: 2017-01-19 | Disposition: A | Discharge status: Home / Self Care | Payer: No Typology Code available for payment source | Source: Ambulatory Visit | Attending: Radiation Oncology | Admitting: Radiation Oncology

## 2017-01-19 DIAGNOSIS — Z170 Estrogen receptor positive status [ER+]: Principal | ICD-10-CM

## 2017-01-19 DIAGNOSIS — C50312 Malignant neoplasm of lower-inner quadrant of left female breast: Secondary | ICD-10-CM

## 2017-01-19 DIAGNOSIS — Z510 Encounter for antineoplastic radiation therapy: Secondary | ICD-10-CM | POA: Diagnosis not present

## 2017-01-19 NOTE — Progress Notes (Signed)
  Radiation Oncology         [phone] ________________________________  Name: Sandra Wood MRN: [id number]  Date: 01/19/2017  DOB: [birth year]  SIMULATION AND TREATMENT PLANNING NOTE    ICD-10-CM   1. Malignant neoplasm of lower-inner quadrant of left breast in female, estrogen receptor positive ([place]) C50.312    Z17.0     DIAGNOSIS:  Sandra Wood is a 61 y.o. female with pathologic stage pT1c,pN0 high grade invasive ductal carcinoma of the left breast (triple positive).  NARRATIVE:  The patient was brought to the [facility].  Identity was confirmed.  All relevant records and images related to the planned course of therapy were reviewed.  The patient freely provided informed written consent to proceed with treatment after reviewing the details related to the planned course of therapy. The consent form was witnessed and verified by the simulation staff.  Then, the patient was set-up in a stable reproducible  supine position for radiation therapy.  CT images were obtained.  Surface markings were placed.  The CT images were loaded into the planning software.  Then the target and avoidance structures were contoured.  Treatment planning then occurred.  The radiation prescription was entered and confirmed.  Then, I designed and supervised the construction of a total of 3 medically necessary complex treatment devices.  I have requested : 3D Simulation  I have requested a DVH of the following structures: Heart, lungs, lumpectomy cavity.  I have ordered:dose calc.  PLAN:  The patient will receive 50.4 Gy in 28 fractions followed by a boost to the lumpectomy cavity of 12 gray for a cumulative dose of 62.4 gray  -----------------------------------  Blair Promise, PhD, MD

## 2017-01-20 DIAGNOSIS — Z510 Encounter for antineoplastic radiation therapy: Secondary | ICD-10-CM | POA: Diagnosis not present

## 2017-01-25 ENCOUNTER — Other Ambulatory Visit: Payer: Self-pay | Admitting: Hematology and Oncology

## 2017-01-25 ENCOUNTER — Other Ambulatory Visit (HOSPITAL_BASED_OUTPATIENT_CLINIC_OR_DEPARTMENT_OTHER): Payer: No Typology Code available for payment source

## 2017-01-25 ENCOUNTER — Other Ambulatory Visit: Payer: Self-pay

## 2017-01-25 ENCOUNTER — Ambulatory Visit (HOSPITAL_BASED_OUTPATIENT_CLINIC_OR_DEPARTMENT_OTHER): Payer: No Typology Code available for payment source

## 2017-01-25 ENCOUNTER — Ambulatory Visit: Payer: No Typology Code available for payment source

## 2017-01-25 VITALS — BP 130/64 | HR 89 | Temp 99.3°F | Resp 20

## 2017-01-25 DIAGNOSIS — C50312 Malignant neoplasm of lower-inner quadrant of left female breast: Secondary | ICD-10-CM | POA: Diagnosis not present

## 2017-01-25 DIAGNOSIS — Z5112 Encounter for antineoplastic immunotherapy: Secondary | ICD-10-CM | POA: Diagnosis not present

## 2017-01-25 DIAGNOSIS — Z170 Estrogen receptor positive status [ER+]: Principal | ICD-10-CM

## 2017-01-25 DIAGNOSIS — Z95828 Presence of other vascular implants and grafts: Secondary | ICD-10-CM

## 2017-01-25 LAB — COMPREHENSIVE METABOLIC PANEL
ALK PHOS: 127 U/L (ref 40–150)
ALT: 19 U/L (ref 0–55)
ANION GAP: 10 meq/L (ref 3–11)
AST: 14 U/L (ref 5–34)
Albumin: 3.2 g/dL — ABNORMAL LOW (ref 3.5–5.0)
BILIRUBIN TOTAL: 0.71 mg/dL (ref 0.20–1.20)
BUN: 14.6 mg/dL (ref 7.0–26.0)
CALCIUM: 9.2 mg/dL (ref 8.4–10.4)
CHLORIDE: 106 meq/L (ref 98–109)
CO2: 24 mEq/L (ref 22–29)
CREATININE: 0.8 mg/dL (ref 0.6–1.1)
EGFR: 83 mL/min/{1.73_m2} — ABNORMAL LOW (ref >90)
Glucose: 166 mg/dl — ABNORMAL HIGH (ref 70–140)
POTASSIUM: 3.6 meq/L (ref 3.5–5.1)
Sodium: 140 mEq/L (ref 136–145)
Total Protein: 7.1 g/dL (ref 6.4–8.3)

## 2017-01-25 LAB — CBC WITH DIFFERENTIAL/PLATELET
BASO%: 0.6 % (ref 0.0–2.0)
BASOS ABS: 0.1 10*3/uL (ref 0.0–0.1)
EOS%: 3.2 % (ref 0.0–7.0)
Eosinophils Absolute: 0.4 10*3/uL (ref 0.0–0.5)
HEMATOCRIT: 39.2 % (ref 34.8–46.6)
HGB: 12.8 g/dL (ref 11.6–15.9)
LYMPH#: 2.8 10*3/uL (ref 0.9–3.3)
LYMPH%: 23.1 % (ref 14.0–49.7)
MCH: 28.6 pg (ref 25.1–34.0)
MCHC: 32.6 g/dL (ref 31.5–36.0)
MCV: 87.8 fL (ref 79.5–101.0)
MONO#: 0.9 10*3/uL (ref 0.1–0.9)
MONO%: 7.7 % (ref 0.0–14.0)
NEUT#: 7.8 10*3/uL — ABNORMAL HIGH (ref 1.5–6.5)
NEUT%: 65.4 % (ref 38.4–76.8)
Platelets: 360 10*3/uL (ref 145–400)
RBC: 4.47 10*6/uL (ref 3.70–5.45)
RDW: 18.5 % — ABNORMAL HIGH (ref 11.2–14.5)
WBC: 12 10*3/uL — ABNORMAL HIGH (ref 3.9–10.3)

## 2017-01-25 LAB — TECHNOLOGIST REVIEW

## 2017-01-25 MED ORDER — TRASTUZUMAB CHEMO 150 MG IV SOLR
900.0000 mg | Freq: Once | INTRAVENOUS | Status: AC
  Administered 2017-01-25: 900 mg via INTRAVENOUS
  Filled 2017-01-25: qty 42.86

## 2017-01-25 MED ORDER — HEPARIN SOD (PORK) LOCK FLUSH 100 UNIT/ML IV SOLN
500.0000 [IU] | Freq: Once | INTRAVENOUS | Status: AC | PRN
  Administered 2017-01-25: 500 [IU]
  Filled 2017-01-25: qty 5

## 2017-01-25 MED ORDER — ACETAMINOPHEN 325 MG PO TABS
650.0000 mg | ORAL_TABLET | Freq: Once | ORAL | Status: AC
  Administered 2017-01-25: 650 mg via ORAL

## 2017-01-25 MED ORDER — ACETAMINOPHEN 325 MG PO TABS
ORAL_TABLET | ORAL | Status: AC
  Filled 2017-01-25: qty 2

## 2017-01-25 MED ORDER — SODIUM CHLORIDE 0.9 % IV SOLN
Freq: Once | INTRAVENOUS | Status: AC
  Administered 2017-01-25: 10:00:00 via INTRAVENOUS

## 2017-01-25 MED ORDER — DIPHENHYDRAMINE HCL 25 MG PO CAPS
50.0000 mg | ORAL_CAPSULE | Freq: Once | ORAL | Status: AC
  Administered 2017-01-25: 50 mg via ORAL

## 2017-01-25 MED ORDER — SODIUM CHLORIDE 0.9% FLUSH
10.0000 mL | INTRAVENOUS | Status: DC | PRN
  Administered 2017-01-25: 10 mL
  Filled 2017-01-25: qty 10

## 2017-01-25 MED ORDER — DIPHENHYDRAMINE HCL 25 MG PO CAPS
ORAL_CAPSULE | ORAL | Status: AC
  Filled 2017-01-25: qty 2

## 2017-01-25 MED ORDER — SODIUM CHLORIDE 0.9% FLUSH
10.0000 mL | INTRAVENOUS | Status: DC | PRN
  Administered 2017-01-25: 10 mL via INTRAVENOUS
  Filled 2017-01-25: qty 10

## 2017-01-25 NOTE — Patient Instructions (Signed)
[facility] Discharge Instructions for Patients Receiving Chemotherapy  Today you received the following chemotherapy agents: Herceptin   To help prevent nausea and vomiting after your treatment, we encourage you to take your nausea medication as directed.    If you develop nausea and vomiting that is not controlled by your nausea medication, call the clinic.   BELOW ARE SYMPTOMS THAT SHOULD BE REPORTED IMMEDIATELY:  *FEVER GREATER THAN 100.5 F  *CHILLS WITH OR WITHOUT FEVER  NAUSEA AND VOMITING THAT IS NOT CONTROLLED WITH YOUR NAUSEA MEDICATION  *UNUSUAL SHORTNESS OF BREATH  *UNUSUAL BRUISING OR BLEEDING  TENDERNESS IN MOUTH AND THROAT WITH OR WITHOUT PRESENCE OF ULCERS  *URINARY PROBLEMS  *BOWEL PROBLEMS  UNUSUAL RASH Items with * indicate a potential emergency and should be followed up as soon as possible.  Feel free to call the clinic you have any questions or concerns. The clinic phone number is [phone].  Please show the CHEMO ALERT CARD at check-in to the Emergency Department and triage nurse.   

## 2017-01-25 NOTE — Patient Instructions (Signed)

## 2017-01-26 ENCOUNTER — Ambulatory Visit
Admission: RE | Admit: 2017-01-26 | Discharge: 2017-01-26 | Disposition: A | Discharge status: Home / Self Care | Payer: No Typology Code available for payment source | Source: Ambulatory Visit | Attending: Radiation Oncology | Admitting: Radiation Oncology

## 2017-01-26 DIAGNOSIS — Z510 Encounter for antineoplastic radiation therapy: Secondary | ICD-10-CM | POA: Diagnosis not present

## 2017-01-26 DIAGNOSIS — C50312 Malignant neoplasm of lower-inner quadrant of left female breast: Secondary | ICD-10-CM

## 2017-01-26 DIAGNOSIS — Z170 Estrogen receptor positive status [ER+]: Principal | ICD-10-CM

## 2017-01-26 NOTE — Progress Notes (Signed)
  Radiation Oncology         [phone]) [phone] ________________________________  Name: Sandra Wood MRN: [id number]  Date: 01/26/2017  DOB: [birth year]  Simulation Verification Note    ICD-10-CM   1. Malignant neoplasm of lower-inner quadrant of left breast in female, estrogen receptor positive ([place]) C50.312    Z17.0     Status: outpatient  NARRATIVE: The patient was brought to the treatment unit and placed in the planned treatment position. The clinical setup was verified. Then port films were obtained and uploaded to the radiation oncology medical record software.  The treatment beams were carefully compared against the planned radiation fields. The position location and shape of the radiation fields was reviewed. They targeted volume of tissue appears to be appropriately covered by the radiation beams. Organs at risk appear to be excluded as planned.  Based on my personal review, I approved the simulation verification. The patient's treatment will proceed as planned.  -----------------------------------  Blair Promise, PhD, MD

## 2017-01-27 ENCOUNTER — Ambulatory Visit
Admission: RE | Admit: 2017-01-27 | Discharge: 2017-01-27 | Disposition: A | Discharge status: Home / Self Care | Payer: No Typology Code available for payment source | Source: Ambulatory Visit | Attending: Radiation Oncology | Admitting: Radiation Oncology

## 2017-01-27 DIAGNOSIS — Z510 Encounter for antineoplastic radiation therapy: Secondary | ICD-10-CM | POA: Diagnosis not present

## 2017-01-28 ENCOUNTER — Ambulatory Visit
Admission: RE | Admit: 2017-01-28 | Discharge: 2017-01-28 | Disposition: A | Discharge status: Home / Self Care | Payer: No Typology Code available for payment source | Source: Ambulatory Visit | Attending: Radiation Oncology | Admitting: Radiation Oncology

## 2017-01-28 ENCOUNTER — Inpatient Hospital Stay
Admission: RE | Admit: 2017-01-28 | Discharge: 2017-01-28 | Disposition: A | Discharge status: Home / Self Care | Payer: Self-pay | Source: Ambulatory Visit | Attending: Radiation Oncology | Admitting: Radiation Oncology

## 2017-01-28 DIAGNOSIS — Z170 Estrogen receptor positive status [ER+]: Principal | ICD-10-CM

## 2017-01-28 DIAGNOSIS — Z510 Encounter for antineoplastic radiation therapy: Secondary | ICD-10-CM | POA: Diagnosis not present

## 2017-01-28 DIAGNOSIS — C50312 Malignant neoplasm of lower-inner quadrant of left female breast: Secondary | ICD-10-CM

## 2017-01-28 MED ORDER — RADIAPLEXRX EX GEL
Freq: Once | CUTANEOUS | Status: AC
  Administered 2017-01-28: 12:00:00 via TOPICAL

## 2017-01-28 MED ORDER — ALRA NON-METALLIC DEODORANT (RAD-ONC)
1.0000 "application " | Freq: Once | TOPICAL | Status: AC
  Administered 2017-01-28: 1 via TOPICAL

## 2017-01-28 NOTE — Progress Notes (Signed)
Pt here for patient teaching.  Pt given Radiation and You booklet, skin care instructions, Alra deodorant and Radiaplex gel.  Reviewed areas of pertinence such as fatigue, skin changes, breast tenderness and breast swelling . Pt able to give teach back of to pat skin and use unscented/gentle soap,apply Radiaplex bid, avoid applying anything to skin within 4 hours of treatment, avoid wearing an under wire bra and to use an electric razor if they must shave. Pt demonstrated understanding and verbalizes understanding of information given and will contact nursing with any questions or concerns.          

## 2017-01-29 ENCOUNTER — Ambulatory Visit
Admission: RE | Admit: 2017-01-29 | Discharge: 2017-01-29 | Disposition: A | Discharge status: Home / Self Care | Payer: No Typology Code available for payment source | Source: Ambulatory Visit | Attending: Radiation Oncology | Admitting: Radiation Oncology

## 2017-01-29 DIAGNOSIS — Z510 Encounter for antineoplastic radiation therapy: Secondary | ICD-10-CM | POA: Diagnosis not present

## 2017-02-01 ENCOUNTER — Ambulatory Visit
Admission: RE | Admit: 2017-02-01 | Discharge: 2017-02-01 | Disposition: A | Discharge status: Home / Self Care | Payer: No Typology Code available for payment source | Source: Ambulatory Visit | Attending: Radiation Oncology | Admitting: Radiation Oncology

## 2017-02-01 DIAGNOSIS — Z510 Encounter for antineoplastic radiation therapy: Secondary | ICD-10-CM | POA: Diagnosis not present

## 2017-02-02 ENCOUNTER — Ambulatory Visit
Admission: RE | Admit: 2017-02-02 | Discharge: 2017-02-02 | Disposition: A | Discharge status: Home / Self Care | Payer: No Typology Code available for payment source | Source: Ambulatory Visit | Attending: Radiation Oncology | Admitting: Radiation Oncology

## 2017-02-02 DIAGNOSIS — Z510 Encounter for antineoplastic radiation therapy: Secondary | ICD-10-CM | POA: Diagnosis not present

## 2017-02-03 ENCOUNTER — Ambulatory Visit
Admission: RE | Admit: 2017-02-03 | Discharge: 2017-02-03 | Disposition: A | Discharge status: Home / Self Care | Payer: No Typology Code available for payment source | Source: Ambulatory Visit | Attending: Radiation Oncology | Admitting: Radiation Oncology

## 2017-02-03 DIAGNOSIS — Z510 Encounter for antineoplastic radiation therapy: Secondary | ICD-10-CM | POA: Diagnosis not present

## 2017-02-04 ENCOUNTER — Ambulatory Visit
Admission: RE | Admit: 2017-02-04 | Discharge: 2017-02-04 | Disposition: A | Discharge status: Home / Self Care | Payer: No Typology Code available for payment source | Source: Ambulatory Visit | Attending: Radiation Oncology | Admitting: Radiation Oncology

## 2017-02-04 DIAGNOSIS — Z510 Encounter for antineoplastic radiation therapy: Secondary | ICD-10-CM | POA: Diagnosis not present

## 2017-02-05 ENCOUNTER — Ambulatory Visit
Admission: RE | Admit: 2017-02-05 | Discharge: 2017-02-05 | Disposition: A | Discharge status: Home / Self Care | Payer: No Typology Code available for payment source | Source: Ambulatory Visit | Attending: Radiation Oncology | Admitting: Radiation Oncology

## 2017-02-05 DIAGNOSIS — Z510 Encounter for antineoplastic radiation therapy: Secondary | ICD-10-CM | POA: Diagnosis not present

## 2017-02-08 ENCOUNTER — Ambulatory Visit
Admission: RE | Admit: 2017-02-08 | Discharge: 2017-02-08 | Disposition: A | Discharge status: Home / Self Care | Payer: No Typology Code available for payment source | Source: Ambulatory Visit | Attending: Radiation Oncology | Admitting: Radiation Oncology

## 2017-02-08 DIAGNOSIS — Z510 Encounter for antineoplastic radiation therapy: Secondary | ICD-10-CM | POA: Diagnosis not present

## 2017-02-09 ENCOUNTER — Ambulatory Visit
Admission: RE | Admit: 2017-02-09 | Discharge: 2017-02-09 | Disposition: A | Discharge status: Home / Self Care | Payer: No Typology Code available for payment source | Source: Ambulatory Visit | Attending: Radiation Oncology | Admitting: Radiation Oncology

## 2017-02-09 DIAGNOSIS — Z510 Encounter for antineoplastic radiation therapy: Secondary | ICD-10-CM | POA: Diagnosis not present

## 2017-02-10 ENCOUNTER — Ambulatory Visit
Admission: RE | Admit: 2017-02-10 | Discharge: 2017-02-10 | Disposition: A | Discharge status: Home / Self Care | Payer: No Typology Code available for payment source | Source: Ambulatory Visit | Attending: Radiation Oncology | Admitting: Radiation Oncology

## 2017-02-10 DIAGNOSIS — Z510 Encounter for antineoplastic radiation therapy: Secondary | ICD-10-CM | POA: Diagnosis not present

## 2017-02-11 ENCOUNTER — Ambulatory Visit
Admission: RE | Admit: 2017-02-11 | Discharge: 2017-02-11 | Disposition: A | Discharge status: Home / Self Care | Payer: No Typology Code available for payment source | Source: Ambulatory Visit | Attending: Radiation Oncology | Admitting: Radiation Oncology

## 2017-02-11 DIAGNOSIS — Z510 Encounter for antineoplastic radiation therapy: Secondary | ICD-10-CM | POA: Diagnosis not present

## 2017-02-12 ENCOUNTER — Ambulatory Visit
Admission: RE | Admit: 2017-02-12 | Discharge: 2017-02-12 | Disposition: A | Discharge status: Home / Self Care | Payer: No Typology Code available for payment source | Source: Ambulatory Visit | Attending: Radiation Oncology | Admitting: Radiation Oncology

## 2017-02-12 DIAGNOSIS — Z510 Encounter for antineoplastic radiation therapy: Secondary | ICD-10-CM | POA: Diagnosis not present

## 2017-02-15 ENCOUNTER — Ambulatory Visit: Payer: No Typology Code available for payment source

## 2017-02-15 ENCOUNTER — Ambulatory Visit (HOSPITAL_BASED_OUTPATIENT_CLINIC_OR_DEPARTMENT_OTHER): Payer: No Typology Code available for payment source

## 2017-02-15 ENCOUNTER — Encounter: Payer: Self-pay | Admitting: *Deleted

## 2017-02-15 ENCOUNTER — Encounter: Payer: Self-pay | Admitting: Hematology and Oncology

## 2017-02-15 ENCOUNTER — Ambulatory Visit
Admission: RE | Admit: 2017-02-15 | Discharge: 2017-02-15 | Disposition: A | Discharge status: Home / Self Care | Payer: No Typology Code available for payment source | Source: Ambulatory Visit | Attending: Radiation Oncology | Admitting: Radiation Oncology

## 2017-02-15 ENCOUNTER — Other Ambulatory Visit (HOSPITAL_BASED_OUTPATIENT_CLINIC_OR_DEPARTMENT_OTHER): Payer: No Typology Code available for payment source

## 2017-02-15 ENCOUNTER — Ambulatory Visit (HOSPITAL_BASED_OUTPATIENT_CLINIC_OR_DEPARTMENT_OTHER): Payer: No Typology Code available for payment source | Admitting: Hematology and Oncology

## 2017-02-15 DIAGNOSIS — Z170 Estrogen receptor positive status [ER+]: Principal | ICD-10-CM

## 2017-02-15 DIAGNOSIS — Z5112 Encounter for antineoplastic immunotherapy: Secondary | ICD-10-CM | POA: Diagnosis not present

## 2017-02-15 DIAGNOSIS — Z510 Encounter for antineoplastic radiation therapy: Secondary | ICD-10-CM | POA: Diagnosis not present

## 2017-02-15 DIAGNOSIS — C50312 Malignant neoplasm of lower-inner quadrant of left female breast: Secondary | ICD-10-CM

## 2017-02-15 DIAGNOSIS — Z95828 Presence of other vascular implants and grafts: Secondary | ICD-10-CM

## 2017-02-15 LAB — CBC WITH DIFFERENTIAL/PLATELET
BASO%: 0.3 % (ref 0.0–2.0)
Basophils Absolute: 0 10*3/uL (ref 0.0–0.1)
EOS%: 3.5 % (ref 0.0–7.0)
Eosinophils Absolute: 0.4 10*3/uL (ref 0.0–0.5)
HCT: 41.2 % (ref 34.8–46.6)
HEMOGLOBIN: 12.9 g/dL (ref 11.6–15.9)
LYMPH%: 18.1 % (ref 14.0–49.7)
MCH: 28.9 pg (ref 25.1–34.0)
MCHC: 31.3 g/dL — ABNORMAL LOW (ref 31.5–36.0)
MCV: 92.2 fL (ref 79.5–101.0)
MONO#: 1.2 10*3/uL — ABNORMAL HIGH (ref 0.1–0.9)
MONO%: 9.7 % (ref 0.0–14.0)
NEUT%: 68.4 % (ref 38.4–76.8)
NEUTROS ABS: 8.1 10*3/uL — AB (ref 1.5–6.5)
Platelets: 268 10*3/uL (ref 145–400)
RBC: 4.47 10*6/uL (ref 3.70–5.45)
RDW: 15.7 % — AB (ref 11.2–14.5)
WBC: 11.8 10*3/uL — AB (ref 3.9–10.3)
lymph#: 2.1 10*3/uL (ref 0.9–3.3)

## 2017-02-15 LAB — COMPREHENSIVE METABOLIC PANEL
ALBUMIN: 3.2 g/dL — AB (ref 3.5–5.0)
ALK PHOS: 127 U/L (ref 40–150)
ALT: 14 U/L (ref 0–55)
AST: 12 U/L (ref 5–34)
Anion Gap: 7 mEq/L (ref 3–11)
BUN: 12.1 mg/dL (ref 7.0–26.0)
CO2: 26 mEq/L (ref 22–29)
Calcium: 9.3 mg/dL (ref 8.4–10.4)
Chloride: 107 mEq/L (ref 98–109)
Creatinine: 0.7 mg/dL (ref 0.6–1.1)
EGFR: 87 mL/min/{1.73_m2} — ABNORMAL LOW (ref >90)
GLUCOSE: 138 mg/dL (ref 70–140)
POTASSIUM: 4.1 meq/L (ref 3.5–5.1)
SODIUM: 140 meq/L (ref 136–145)
TOTAL PROTEIN: 7.3 g/dL (ref 6.4–8.3)
Total Bilirubin: 0.59 mg/dL (ref 0.20–1.20)

## 2017-02-15 MED ORDER — SODIUM CHLORIDE 0.9% FLUSH
10.0000 mL | INTRAVENOUS | Status: DC | PRN
  Administered 2017-02-15: 10 mL
  Filled 2017-02-15: qty 10

## 2017-02-15 MED ORDER — DIPHENHYDRAMINE HCL 25 MG PO CAPS
ORAL_CAPSULE | ORAL | Status: AC
  Filled 2017-02-15: qty 1

## 2017-02-15 MED ORDER — ACETAMINOPHEN 325 MG PO TABS
650.0000 mg | ORAL_TABLET | Freq: Once | ORAL | Status: AC
  Administered 2017-02-15: 650 mg via ORAL

## 2017-02-15 MED ORDER — DIPHENHYDRAMINE HCL 25 MG PO CAPS
25.0000 mg | ORAL_CAPSULE | Freq: Once | ORAL | Status: AC
  Administered 2017-02-15: 25 mg via ORAL

## 2017-02-15 MED ORDER — TRASTUZUMAB CHEMO 150 MG IV SOLR
900.0000 mg | Freq: Once | INTRAVENOUS | Status: AC
  Administered 2017-02-15: 900 mg via INTRAVENOUS
  Filled 2017-02-15: qty 42.86

## 2017-02-15 MED ORDER — HEPARIN SOD (PORK) LOCK FLUSH 100 UNIT/ML IV SOLN
500.0000 [IU] | Freq: Once | INTRAVENOUS | Status: AC | PRN
  Administered 2017-02-15: 500 [IU]
  Filled 2017-02-15: qty 5

## 2017-02-15 MED ORDER — ACETAMINOPHEN 325 MG PO TABS
ORAL_TABLET | ORAL | Status: AC
  Filled 2017-02-15: qty 2

## 2017-02-15 MED ORDER — AMOXICILLIN 500 MG PO TABS: 500.0000 mg | ORAL_TABLET | Freq: Two times a day (BID) | ORAL | 0 refills | Status: DC

## 2017-02-15 MED ORDER — SODIUM CHLORIDE 0.9 % IV SOLN
Freq: Once | INTRAVENOUS | Status: AC
  Administered 2017-02-15: 11:00:00 via INTRAVENOUS

## 2017-02-15 MED ORDER — SODIUM CHLORIDE 0.9% FLUSH
10.0000 mL | INTRAVENOUS | Status: DC | PRN
  Administered 2017-02-15: 10 mL via INTRAVENOUS
  Filled 2017-02-15: qty 10

## 2017-02-15 NOTE — Patient Instructions (Signed)

## 2017-02-15 NOTE — Patient Instructions (Signed)
[facility] Discharge Instructions for Patients Receiving Chemotherapy  Today you received the following chemotherapy agents: Herceptin   To help prevent nausea and vomiting after your treatment, we encourage you to take your nausea medication as directed.    If you develop nausea and vomiting that is not controlled by your nausea medication, call the clinic.   BELOW ARE SYMPTOMS THAT SHOULD BE REPORTED IMMEDIATELY:  *FEVER GREATER THAN 100.5 F  *CHILLS WITH OR WITHOUT FEVER  NAUSEA AND VOMITING THAT IS NOT CONTROLLED WITH YOUR NAUSEA MEDICATION  *UNUSUAL SHORTNESS OF BREATH  *UNUSUAL BRUISING OR BLEEDING  TENDERNESS IN MOUTH AND THROAT WITH OR WITHOUT PRESENCE OF ULCERS  *URINARY PROBLEMS  *BOWEL PROBLEMS  UNUSUAL RASH Items with * indicate a potential emergency and should be followed up as soon as possible.  Feel free to call the clinic you have any questions or concerns. The clinic phone number is [phone].  Please show the CHEMO ALERT CARD at check-in to the Emergency Department and triage nurse.   

## 2017-02-15 NOTE — Progress Notes (Signed)
Patient Care Team: Janith Lima, MD as PCP - General (Internal Medicine) Alphonsa Overall, MD as Consulting Physician (General Surgery) Nicholas Lose, MD as Consulting Physician (Hematology and Oncology) Eppie Gibson, MD as Attending Physician (Radiation Oncology)  DIAGNOSIS:  Encounter Diagnosis  Name Primary?  . Malignant neoplasm of lower-inner quadrant of left breast in female, estrogen receptor positive ([facility])     SUMMARY OF ONCOLOGIC HISTORY:   Malignant neoplasm of lower-inner quadrant of left breast in female, estrogen receptor positive ([facility])   08/18/2016 Initial Diagnosis    Left breast biopsy 8:00 retroareolar position: IDC, high-grade with DCIS high-grade, ER 90%, PR 95%, Ki-67 20%, HER-2 positive ratio 3.4; screening detected left breast distortion LIQ 8 8:00 retroareolar 0.8 cm, axilla negative, T1b N0 stage IA clinical stage      09/22/2016 Surgery    Left lumpectomy: IDC grade 3, 1.3 cm, DCIS high-grade, margins clear, 0/1 lymph node negative, T1CN 0 stage IA, ER 90%, PR 95%, Ki-67 20%, HER-2 positive ratio 3.4      10/19/2016 - 01/04/2017 Chemotherapy    Taxol Herceptin weekly 12 followed by Herceptin maintenance every 3 weeks for 1 year        CHIEF COMPLIANT: Herceptin maintenance therapy  INTERVAL HISTORY: Sandra Wood is a 61 year old with above-mentioned history of left breast cancer treated with lumpectomy followed by adjuvant chemotherapy. She completed chemotherapy and is now on adjuvant radiation. She is also getting Herceptin maintenance every 3 weeks. She reports some no major problems tolerating Herceptin. She is also tolerating radiation fairly well although it makes her tired and sleepy. She tells me that with 2 tablets of Benadryl she is getting very loopy and she does not want to take that much Benadryl. Severe reduced today to 1 tablet.  REVIEW OF SYSTEMS:   Constitutional: Denies fevers, chills or abnormal weight loss Eyes: Denies blurriness  of vision Ears, nose, mouth, throat, and face: Denies mucositis or sore throat Respiratory: Denies cough, dyspnea or wheezes Cardiovascular: Denies palpitation, chest discomfort Gastrointestinal:  Denies nausea, heartburn or change in bowel habits Skin: Denies abnormal skin rashes Lymphatics: Denies new lymphadenopathy or easy bruising Neurological:Denies numbness, tingling or new weaknesses Behavioral/Psych: Mood is stable, no new changes  Extremities: No lower extremity edema Breast:  denies any pain or lumps or nodules in either breasts All other systems were reviewed with the patient and are negative.  I have reviewed the past medical history, past surgical history, social history and family history with the patient and they are unchanged from previous note.  ALLERGIES:  is allergic to no known allergies.  MEDICATIONS:  Current Outpatient Prescriptions  Medication Sig Dispense Refill  . blood glucose meter kit and supplies KIT Dispense based on patient and insurance preference. Use up to four times daily as directed. (FOR ICD-9 250.00, 250.01). Check blood sugar BID. (Patient not taking: Reported on 01/11/2017) 1 each 0  . glucose blood test strip Use as instructed (Patient not taking: Reported on 01/11/2017) 100 each 12  . hyaluronate sodium (RADIAPLEXRX) GEL Apply 1 application topically once.    Marland Kitchen HYDROcodone-acetaminophen (NORCO/VICODIN) 5-325 MG tablet Take 1-2 tablets by mouth every 6 (six) hours as needed. (Patient not taking: Reported on 01/11/2017) 20 tablet 0  . ibuprofen (ADVIL,MOTRIN) 200 MG tablet Take 400-600 mg by mouth every 6 (six) hours as needed for headache or mild pain (depends on pain if takes 2-3 tablets).     Marland Kitchen ipratropium-albuterol (DUONEB) 0.5-2.5 (3) MG/3ML SOLN Inhale 3 mLs  into the lungs every 4 (four) hours as needed (shortness of breath).     Demetra Shiner Devices (ADJUSTABLE LANCING DEVICE) MISC 1 each by Does not apply route continuous. (Patient not taking:  Reported on 01/11/2017) 1 each 0  . Lancets MISC 1 applicator by Does not apply route 2 (two) times daily. 60 each 0  . lidocaine-prilocaine (EMLA) cream Apply to affected area once 30 g 3  . LORazepam (ATIVAN) 0.5 MG tablet Take 1 tablet (0.5 mg total) by mouth every 6 (six) hours as needed (Nausea or vomiting). (Patient not taking: Reported on 01/11/2017) 30 tablet 0  . non-metallic deodorant (ALRA) MISC Apply 1 application topically daily as needed.    . ondansetron (ZOFRAN) 8 MG tablet Take 1 tablet (8 mg total) by mouth 2 (two) times daily as needed (Nausea or vomiting). (Patient not taking: Reported on 01/11/2017) 30 tablet 1  . PROAIR HFA 108 (90 Base) MCG/ACT inhaler Inhale 2 puffs into the lungs every 6 (six) hours as needed for wheezing or shortness of breath.     . prochlorperazine (COMPAZINE) 10 MG tablet Take 1 tablet (10 mg total) by mouth every 6 (six) hours as needed (Nausea or vomiting). (Patient not taking: Reported on 01/11/2017) 30 tablet 1  . sertraline (ZOLOFT) 50 MG tablet Take 1 tablet (50 mg total) by mouth daily. (Patient not taking: Reported on 01/11/2017) 90 tablet 3   No current facility-administered medications for this visit.     PHYSICAL EXAMINATION: ECOG PERFORMANCE STATUS: 1 - Symptomatic but completely ambulatory  Vitals:   02/15/17 1029  BP: 138/63  Pulse: 96  Resp: 18  Temp: 98.6 F (37 C)  SpO2: 93%   Filed Weights   02/15/17 1029  Weight: (!) 325 lb 11.2 oz (147.7 kg)    GENERAL:alert, no distress and comfortable SKIN: skin color, texture, turgor are normal, no rashes or significant lesions EYES: normal, Conjunctiva are pink and non-injected, sclera clear OROPHARYNX:no exudate, no erythema and lips, buccal mucosa, and tongue normal  NECK: supple, thyroid normal size, non-tender, without nodularity LYMPH:  no palpable lymphadenopathy in the cervical, axillary or inguinal LUNGS: clear to auscultation and percussion with normal breathing  effort HEART: regular rate & rhythm and no murmurs and no lower extremity edema ABDOMEN:abdomen soft, non-tender and normal bowel sounds MUSCULOSKELETAL:no cyanosis of digits and no clubbing  NEURO: alert & oriented x 3 with fluent speech, no focal motor/sensory deficits EXTREMITIES: No lower extremity edema  LABORATORY DATA:  I have reviewed the data as listed   Chemistry      Component Value Date/Time   NA 140 01/25/2017 0924   K 3.6 01/25/2017 0924   CL 103 09/15/2016 1255   CO2 24 01/25/2017 0924   BUN 14.6 01/25/2017 0924   CREATININE 0.8 01/25/2017 0924      Component Value Date/Time   CALCIUM 9.2 01/25/2017 0924   ALKPHOS 127 01/25/2017 0924   AST 14 01/25/2017 0924   ALT 19 01/25/2017 0924   BILITOT 0.71 01/25/2017 0924       Lab Results  Component Value Date   WBC 11.8 (H) 02/15/2017   HGB 12.9 02/15/2017   HCT 41.2 02/15/2017   MCV 92.2 02/15/2017   PLT 268 02/15/2017   NEUTROABS 8.1 (H) 02/15/2017    ASSESSMENT & PLAN:  Malignant neoplasm of lower-inner quadrant of left breast in female, estrogen receptor positive ([facility]) Left breast biopsy02/20/2018:8:00 retroareolar position: IDC, high-grade with DCIS high-grade, ER 90%, PR 95%, Ki-67 20%,  HER-2 positive ratio 3.4; screening detected left breast distortion LIQ 8 8:00 retroareolar 0.8 cm, axilla negative, T1b N0 stage IA clinical stage Left lumpectomy 09/22/2016: IDC grade 3, 1.3 cm, DCIS high-grade, margins clear, 0/1 lymph node negative, T1 CN 0 stage IA, ER 90%, PR 95%, Ki-67 20%, HER-2 positive ratio 3.4  Recommendation: 1. Adjuvant chemotherapy and Herceptin with weekly Taxol and Herceptin 12  completed 01/04/2017 followed by Herceptin maintenance for 1 year 3. Followed by adjuvant radiation  4. Followed by adjuvant antiestrogen therapy with letrozole 2.5 mg daily 5 years ----------------------------------------------------------------------------- Current treatment: Herceptin  maintenance Diabetes: Patient being more careful about her diet  Return to clinic every 3 weeks for Herceptin every 6 weeks for follow-up with me We discussed with her about 6 months versus one year of Herceptin. All I would lean towards discontinuing anti-HER-2 therapy at the end of 6 months. We will make a final decision at the end of October.  I spent 25 minutes talking to the patient of which more than half was spent in counseling and coordination of care.  No orders of the defined types were placed in this encounter.  The patient has a good understanding of the overall plan. she agrees with it. she will call with any problems that may develop before the next visit here.   Rulon Eisenmenger, MD 02/15/17

## 2017-02-15 NOTE — Assessment & Plan Note (Signed)
Left breast biopsy02/20/2018:8:00 retroareolar position: IDC, high-grade with DCIS high-grade, ER 90%, PR 95%, Ki-67 20%, HER-2 positive ratio 3.4; screening detected left breast distortion LIQ 8 8:00 retroareolar 0.8 cm, axilla negative, T1b N0 stage IA clinical stage Left lumpectomy 09/22/2016: IDC grade 3, 1.3 cm, DCIS high-grade, margins clear, 0/1 lymph node negative, T1 CN 0 stage IA, ER 90%, PR 95%, Ki-67 20%, HER-2 positive ratio 3.4  Recommendation: 1. Adjuvant chemotherapy and Herceptin with weekly Taxol and Herceptin 12  completed 01/04/2017 followed by Herceptin maintenance for 1 year 3. Followed by adjuvant radiation  4. Followed by adjuvant antiestrogen therapy with letrozole 2.5 mg daily 5 years ----------------------------------------------------------------------------- Current treatment: Herceptin maintenance Diabetes: Patient being more careful about her diet  Return to clinic every 3 weeks for Herceptin every 6 weeks for follow-up with me

## 2017-02-16 ENCOUNTER — Ambulatory Visit
Admission: RE | Admit: 2017-02-16 | Discharge: 2017-02-16 | Disposition: A | Discharge status: Home / Self Care | Payer: No Typology Code available for payment source | Source: Ambulatory Visit | Attending: Radiation Oncology | Admitting: Radiation Oncology

## 2017-02-16 DIAGNOSIS — Z510 Encounter for antineoplastic radiation therapy: Secondary | ICD-10-CM | POA: Diagnosis not present

## 2017-02-16 NOTE — Progress Notes (Signed)
Sandra Wood was given antifungal power with miconazole Nitrate for a rash that she had under her breast.

## 2017-02-17 ENCOUNTER — Ambulatory Visit
Admission: RE | Admit: 2017-02-17 | Discharge: 2017-02-17 | Disposition: A | Discharge status: Home / Self Care | Payer: No Typology Code available for payment source | Source: Ambulatory Visit | Attending: Radiation Oncology | Admitting: Radiation Oncology

## 2017-02-17 DIAGNOSIS — Z510 Encounter for antineoplastic radiation therapy: Secondary | ICD-10-CM | POA: Diagnosis not present

## 2017-02-18 ENCOUNTER — Ambulatory Visit
Admission: RE | Admit: 2017-02-18 | Discharge: 2017-02-18 | Disposition: A | Discharge status: Home / Self Care | Payer: No Typology Code available for payment source | Source: Ambulatory Visit | Attending: Radiation Oncology | Admitting: Radiation Oncology

## 2017-02-18 DIAGNOSIS — Z510 Encounter for antineoplastic radiation therapy: Secondary | ICD-10-CM | POA: Diagnosis not present

## 2017-02-19 ENCOUNTER — Ambulatory Visit
Admission: RE | Admit: 2017-02-19 | Discharge: 2017-02-19 | Disposition: A | Discharge status: Home / Self Care | Payer: No Typology Code available for payment source | Source: Ambulatory Visit | Attending: Radiation Oncology | Admitting: Radiation Oncology

## 2017-02-19 DIAGNOSIS — Z510 Encounter for antineoplastic radiation therapy: Secondary | ICD-10-CM | POA: Diagnosis not present

## 2017-02-22 ENCOUNTER — Ambulatory Visit
Admission: RE | Admit: 2017-02-22 | Discharge: 2017-02-22 | Disposition: A | Discharge status: Home / Self Care | Payer: No Typology Code available for payment source | Source: Ambulatory Visit | Attending: Radiation Oncology | Admitting: Radiation Oncology

## 2017-02-22 DIAGNOSIS — Z510 Encounter for antineoplastic radiation therapy: Secondary | ICD-10-CM | POA: Diagnosis not present

## 2017-02-23 ENCOUNTER — Ambulatory Visit
Admission: RE | Admit: 2017-02-23 | Discharge: 2017-02-23 | Disposition: A | Discharge status: Home / Self Care | Payer: No Typology Code available for payment source | Source: Ambulatory Visit | Attending: Radiation Oncology | Admitting: Radiation Oncology

## 2017-02-23 DIAGNOSIS — Z510 Encounter for antineoplastic radiation therapy: Secondary | ICD-10-CM | POA: Diagnosis not present

## 2017-02-24 ENCOUNTER — Ambulatory Visit
Admission: RE | Admit: 2017-02-24 | Discharge: 2017-02-24 | Disposition: A | Discharge status: Home / Self Care | Payer: No Typology Code available for payment source | Source: Ambulatory Visit | Attending: Radiation Oncology | Admitting: Radiation Oncology

## 2017-02-24 DIAGNOSIS — Z510 Encounter for antineoplastic radiation therapy: Secondary | ICD-10-CM | POA: Diagnosis not present

## 2017-02-25 ENCOUNTER — Ambulatory Visit
Admission: RE | Admit: 2017-02-25 | Discharge: 2017-02-25 | Disposition: A | Discharge status: Home / Self Care | Payer: No Typology Code available for payment source | Source: Ambulatory Visit | Attending: Radiation Oncology | Admitting: Radiation Oncology

## 2017-02-25 DIAGNOSIS — Z510 Encounter for antineoplastic radiation therapy: Secondary | ICD-10-CM | POA: Diagnosis not present

## 2017-02-26 ENCOUNTER — Ambulatory Visit
Admission: RE | Admit: 2017-02-26 | Discharge: 2017-02-26 | Disposition: A | Discharge status: Home / Self Care | Payer: No Typology Code available for payment source | Source: Ambulatory Visit | Attending: Radiation Oncology | Admitting: Radiation Oncology

## 2017-02-26 DIAGNOSIS — Z510 Encounter for antineoplastic radiation therapy: Secondary | ICD-10-CM | POA: Diagnosis not present

## 2017-03-02 ENCOUNTER — Ambulatory Visit
Admission: RE | Admit: 2017-03-02 | Discharge: 2017-03-02 | Disposition: A | Discharge status: Home / Self Care | Payer: No Typology Code available for payment source | Source: Ambulatory Visit | Attending: Radiation Oncology | Admitting: Radiation Oncology

## 2017-03-02 DIAGNOSIS — Z510 Encounter for antineoplastic radiation therapy: Secondary | ICD-10-CM | POA: Diagnosis not present

## 2017-03-02 DIAGNOSIS — C50312 Malignant neoplasm of lower-inner quadrant of left female breast: Secondary | ICD-10-CM

## 2017-03-02 DIAGNOSIS — Z170 Estrogen receptor positive status [ER+]: Principal | ICD-10-CM

## 2017-03-02 MED ORDER — RADIAPLEXRX EX GEL
Freq: Once | CUTANEOUS | Status: AC
  Administered 2017-03-02: 10:00:00 via TOPICAL

## 2017-03-03 ENCOUNTER — Ambulatory Visit
Admission: RE | Admit: 2017-03-03 | Discharge: 2017-03-03 | Disposition: A | Discharge status: Home / Self Care | Payer: No Typology Code available for payment source | Source: Ambulatory Visit | Attending: Radiation Oncology | Admitting: Radiation Oncology

## 2017-03-03 DIAGNOSIS — Z510 Encounter for antineoplastic radiation therapy: Secondary | ICD-10-CM | POA: Diagnosis not present

## 2017-03-04 ENCOUNTER — Ambulatory Visit
Admission: RE | Admit: 2017-03-04 | Discharge: 2017-03-04 | Disposition: A | Discharge status: Home / Self Care | Payer: No Typology Code available for payment source | Source: Ambulatory Visit | Attending: Radiation Oncology | Admitting: Radiation Oncology

## 2017-03-04 DIAGNOSIS — Z510 Encounter for antineoplastic radiation therapy: Secondary | ICD-10-CM | POA: Diagnosis not present

## 2017-03-05 ENCOUNTER — Other Ambulatory Visit: Payer: Self-pay

## 2017-03-05 ENCOUNTER — Ambulatory Visit
Admission: RE | Admit: 2017-03-05 | Discharge: 2017-03-05 | Disposition: A | Discharge status: Home / Self Care | Payer: No Typology Code available for payment source | Source: Ambulatory Visit | Attending: Radiation Oncology | Admitting: Radiation Oncology

## 2017-03-05 DIAGNOSIS — Z510 Encounter for antineoplastic radiation therapy: Secondary | ICD-10-CM | POA: Diagnosis not present

## 2017-03-05 DIAGNOSIS — Z170 Estrogen receptor positive status [ER+]: Principal | ICD-10-CM

## 2017-03-05 DIAGNOSIS — C50312 Malignant neoplasm of lower-inner quadrant of left female breast: Secondary | ICD-10-CM

## 2017-03-08 ENCOUNTER — Ambulatory Visit (HOSPITAL_BASED_OUTPATIENT_CLINIC_OR_DEPARTMENT_OTHER): Payer: No Typology Code available for payment source

## 2017-03-08 ENCOUNTER — Other Ambulatory Visit (HOSPITAL_BASED_OUTPATIENT_CLINIC_OR_DEPARTMENT_OTHER): Payer: No Typology Code available for payment source

## 2017-03-08 ENCOUNTER — Ambulatory Visit: Payer: No Typology Code available for payment source

## 2017-03-08 ENCOUNTER — Ambulatory Visit
Admission: RE | Admit: 2017-03-08 | Discharge: 2017-03-08 | Disposition: A | Discharge status: Home / Self Care | Payer: No Typology Code available for payment source | Source: Ambulatory Visit | Attending: Radiation Oncology | Admitting: Radiation Oncology

## 2017-03-08 VITALS — BP 137/66 | HR 81 | Temp 98.7°F | Resp 19

## 2017-03-08 DIAGNOSIS — Z170 Estrogen receptor positive status [ER+]: Principal | ICD-10-CM

## 2017-03-08 DIAGNOSIS — Z5112 Encounter for antineoplastic immunotherapy: Secondary | ICD-10-CM | POA: Diagnosis not present

## 2017-03-08 DIAGNOSIS — Z95828 Presence of other vascular implants and grafts: Secondary | ICD-10-CM

## 2017-03-08 DIAGNOSIS — C50312 Malignant neoplasm of lower-inner quadrant of left female breast: Secondary | ICD-10-CM | POA: Diagnosis not present

## 2017-03-08 DIAGNOSIS — Z510 Encounter for antineoplastic radiation therapy: Secondary | ICD-10-CM | POA: Diagnosis not present

## 2017-03-08 LAB — CBC WITH DIFFERENTIAL/PLATELET
BASO%: 0.3 % (ref 0.0–2.0)
BASOS ABS: 0 10*3/uL (ref 0.0–0.1)
EOS ABS: 0.3 10*3/uL (ref 0.0–0.5)
EOS%: 3.2 % (ref 0.0–7.0)
HCT: 43.1 % (ref 34.8–46.6)
HEMOGLOBIN: 13.4 g/dL (ref 11.6–15.9)
LYMPH#: 1.7 10*3/uL (ref 0.9–3.3)
LYMPH%: 19.4 % (ref 14.0–49.7)
MCH: 28.2 pg (ref 25.1–34.0)
MCHC: 31.1 g/dL — ABNORMAL LOW (ref 31.5–36.0)
MCV: 90.5 fL (ref 79.5–101.0)
MONO#: 0.8 10*3/uL (ref 0.1–0.9)
MONO%: 9.4 % (ref 0.0–14.0)
NEUT%: 67.7 % (ref 38.4–76.8)
NEUTROS ABS: 5.9 10*3/uL (ref 1.5–6.5)
NRBC: 0 % (ref 0–0)
PLATELETS: 299 10*3/uL (ref 145–400)
RBC: 4.76 10*6/uL (ref 3.70–5.45)
RDW: 14.5 % (ref 11.2–14.5)
WBC: 8.7 10*3/uL (ref 3.9–10.3)

## 2017-03-08 LAB — COMPREHENSIVE METABOLIC PANEL
ALBUMIN: 3.2 g/dL — AB (ref 3.5–5.0)
ALK PHOS: 128 U/L (ref 40–150)
ALT: 13 U/L (ref 0–55)
ANION GAP: 9 meq/L (ref 3–11)
AST: 13 U/L (ref 5–34)
BILIRUBIN TOTAL: 0.57 mg/dL (ref 0.20–1.20)
BUN: 9.6 mg/dL (ref 7.0–26.0)
CO2: 26 meq/L (ref 22–29)
CREATININE: 0.7 mg/dL (ref 0.6–1.1)
Calcium: 9.6 mg/dL (ref 8.4–10.4)
Chloride: 105 mEq/L (ref 98–109)
EGFR: >90 mL/min/{1.73_m2} (ref >90)
GLUCOSE: 138 mg/dL (ref 70–140)
Potassium: 4 mEq/L (ref 3.5–5.1)
Sodium: 140 mEq/L (ref 136–145)
TOTAL PROTEIN: 7.6 g/dL (ref 6.4–8.3)

## 2017-03-08 MED ORDER — SODIUM CHLORIDE 0.9% FLUSH
10.0000 mL | INTRAVENOUS | Status: DC | PRN
  Administered 2017-03-08: 10 mL via INTRAVENOUS
  Filled 2017-03-08: qty 10

## 2017-03-08 MED ORDER — DIPHENHYDRAMINE HCL 25 MG PO CAPS
ORAL_CAPSULE | ORAL | Status: AC
  Filled 2017-03-08: qty 1

## 2017-03-08 MED ORDER — ACETAMINOPHEN 325 MG PO TABS
650.0000 mg | ORAL_TABLET | Freq: Once | ORAL | Status: AC
  Administered 2017-03-08: 650 mg via ORAL

## 2017-03-08 MED ORDER — ACETAMINOPHEN 325 MG PO TABS
ORAL_TABLET | ORAL | Status: AC
  Filled 2017-03-08: qty 2

## 2017-03-08 MED ORDER — SODIUM CHLORIDE 0.9 % IV SOLN
900.0000 mg | Freq: Once | INTRAVENOUS | Status: AC
  Administered 2017-03-08: 900 mg via INTRAVENOUS
  Filled 2017-03-08: qty 42.86

## 2017-03-08 MED ORDER — DIPHENHYDRAMINE HCL 25 MG PO CAPS
25.0000 mg | ORAL_CAPSULE | Freq: Once | ORAL | Status: AC
  Administered 2017-03-08: 25 mg via ORAL

## 2017-03-08 MED ORDER — SODIUM CHLORIDE 0.9 % IV SOLN
Freq: Once | INTRAVENOUS | Status: AC
  Administered 2017-03-08: 11:00:00 via INTRAVENOUS

## 2017-03-08 MED ORDER — SODIUM CHLORIDE 0.9% FLUSH
10.0000 mL | INTRAVENOUS | Status: DC | PRN
  Administered 2017-03-08: 10 mL
  Filled 2017-03-08: qty 10

## 2017-03-08 MED ORDER — HEPARIN SOD (PORK) LOCK FLUSH 100 UNIT/ML IV SOLN
500.0000 [IU] | Freq: Once | INTRAVENOUS | Status: AC | PRN
  Administered 2017-03-08: 500 [IU]
  Filled 2017-03-08: qty 5

## 2017-03-08 NOTE — Patient Instructions (Signed)
[facility] Discharge Instructions for Patients Receiving Chemotherapy  Today you received the following chemotherapy agents: Herceptin   To help prevent nausea and vomiting after your treatment, we encourage you to take your nausea medication as directed.    If you develop nausea and vomiting that is not controlled by your nausea medication, call the clinic.   BELOW ARE SYMPTOMS THAT SHOULD BE REPORTED IMMEDIATELY:  *FEVER GREATER THAN 100.5 F  *CHILLS WITH OR WITHOUT FEVER  NAUSEA AND VOMITING THAT IS NOT CONTROLLED WITH YOUR NAUSEA MEDICATION  *UNUSUAL SHORTNESS OF BREATH  *UNUSUAL BRUISING OR BLEEDING  TENDERNESS IN MOUTH AND THROAT WITH OR WITHOUT PRESENCE OF ULCERS  *URINARY PROBLEMS  *BOWEL PROBLEMS  UNUSUAL RASH Items with * indicate a potential emergency and should be followed up as soon as possible.  Feel free to call the clinic you have any questions or concerns. The clinic phone number is [phone].  Please show the CHEMO ALERT CARD at check-in to the Emergency Department and triage nurse.   

## 2017-03-09 ENCOUNTER — Ambulatory Visit
Admission: RE | Admit: 2017-03-09 | Discharge: 2017-03-09 | Disposition: A | Discharge status: Home / Self Care | Payer: No Typology Code available for payment source | Source: Ambulatory Visit | Attending: Radiation Oncology | Admitting: Radiation Oncology

## 2017-03-09 DIAGNOSIS — Z510 Encounter for antineoplastic radiation therapy: Secondary | ICD-10-CM | POA: Diagnosis not present

## 2017-03-10 ENCOUNTER — Encounter (HOSPITAL_COMMUNITY): Payer: Self-pay | Admitting: Cardiology

## 2017-03-10 ENCOUNTER — Ambulatory Visit (HOSPITAL_BASED_OUTPATIENT_CLINIC_OR_DEPARTMENT_OTHER)
Admission: RE | Admit: 2017-03-10 | Discharge: 2017-03-10 | Disposition: A | Discharge status: Home / Self Care | Payer: No Typology Code available for payment source | Source: Ambulatory Visit | Attending: Cardiology | Admitting: Cardiology

## 2017-03-10 ENCOUNTER — Ambulatory Visit
Admission: RE | Admit: 2017-03-10 | Discharge: 2017-03-10 | Disposition: A | Discharge status: Home / Self Care | Payer: No Typology Code available for payment source | Source: Ambulatory Visit | Attending: Radiation Oncology | Admitting: Radiation Oncology

## 2017-03-10 ENCOUNTER — Ambulatory Visit (HOSPITAL_COMMUNITY)
Admission: RE | Admit: 2017-03-10 | Discharge: 2017-03-10 | Disposition: A | Discharge status: Home / Self Care | Payer: No Typology Code available for payment source | Source: Ambulatory Visit | Attending: Cardiology | Admitting: Cardiology

## 2017-03-10 VITALS — BP 158/86 | HR 87 | Wt 324.1 lb

## 2017-03-10 DIAGNOSIS — Z6841 Body Mass Index (BMI) 40.0 and over, adult: Secondary | ICD-10-CM | POA: Insufficient documentation

## 2017-03-10 DIAGNOSIS — Z79899 Other long term (current) drug therapy: Secondary | ICD-10-CM | POA: Insufficient documentation

## 2017-03-10 DIAGNOSIS — C50312 Malignant neoplasm of lower-inner quadrant of left female breast: Secondary | ICD-10-CM | POA: Insufficient documentation

## 2017-03-10 DIAGNOSIS — I119 Hypertensive heart disease without heart failure: Secondary | ICD-10-CM | POA: Diagnosis not present

## 2017-03-10 DIAGNOSIS — E669 Obesity, unspecified: Secondary | ICD-10-CM | POA: Insufficient documentation

## 2017-03-10 DIAGNOSIS — E119 Type 2 diabetes mellitus without complications: Secondary | ICD-10-CM | POA: Insufficient documentation

## 2017-03-10 DIAGNOSIS — Z170 Estrogen receptor positive status [ER+]: Secondary | ICD-10-CM | POA: Insufficient documentation

## 2017-03-10 DIAGNOSIS — I10 Essential (primary) hypertension: Secondary | ICD-10-CM

## 2017-03-10 DIAGNOSIS — J45909 Unspecified asthma, uncomplicated: Secondary | ICD-10-CM | POA: Insufficient documentation

## 2017-03-10 DIAGNOSIS — Z09 Encounter for follow-up examination after completed treatment for conditions other than malignant neoplasm: Secondary | ICD-10-CM | POA: Insufficient documentation

## 2017-03-10 DIAGNOSIS — K219 Gastro-esophageal reflux disease without esophagitis: Secondary | ICD-10-CM | POA: Diagnosis not present

## 2017-03-10 DIAGNOSIS — Z510 Encounter for antineoplastic radiation therapy: Secondary | ICD-10-CM | POA: Diagnosis not present

## 2017-03-10 DIAGNOSIS — Z87891 Personal history of nicotine dependence: Secondary | ICD-10-CM | POA: Diagnosis not present

## 2017-03-10 MED ORDER — LISINOPRIL 10 MG PO TABS
10.0000 mg | ORAL_TABLET | Freq: Every day | ORAL | 3 refills | Status: DC
Start: 2017-03-10 — End: 2017-06-10

## 2017-03-10 NOTE — Patient Instructions (Signed)
START Lisinopril 10 mg tablet once daily.  Follow up 3 months with echocardiogram and Dr. Aundra Dubin. Take all medication as prescribed the day of your appointment. Bring all medications with you to your appointment.  Do the following things EVERYDAY: 1) Weigh yourself in the morning before breakfast. Write it down and keep it in a log. 2) Take your medicines as prescribed 3) Eat low salt foods-Limit salt (sodium) to 2000 mg per day.  4) Stay as active as you can everyday 5) Limit all fluids for the day to less than 2 liters

## 2017-03-10 NOTE — Progress Notes (Signed)
Oncology: Dr. Lindi Adie  61 yo with history of DM2 and asthma has developed breast cancer.  Breast cancer is on left, ER+/PR+/HER2+.  She had left lumpectomy in 3/18 followed by Taxol + Herceptin to extend for 12 cycles starting 4/18.  She will then continue Herceptin to complete a full year.  She has completed XRT.   She returns for cardiomyopathy screening.  She has had some fatigue with XRT. No chest pain.  No exertional dyspnea. BP is high today, runs in the 130s-140s at home when she checks.    Labs (9/18): K 4, creatinine 0.7  PMH: 1. Asthma: Mild persistent.  2. Type II diabetes. 3. GERD 4. Breast cancer: On left.  ER+/PR+/HER2+.  She had left lumpectomy in 4/18 followed by Taxol + Herceptin x 12 cycles then Herceptin to complete a full year.  - Echo (3/18): EF 60-65%, normal RV size and systolic function, strain imaging not done.  - Echo (6/18): EF 27-03%, normal diastolic function, GLS -50.0% but images were technically difficult.  - Echo (9/18): EF 55-60%, GLS -17.2% (strain images difficult), normal RV size and systolic function, mild LVH.   Social History   Social History  . Marital status: Divorced    Spouse name: N/A  . Number of children: N/A  . Years of education: N/A   Occupational History  . Not on file.   Social History Main Topics  . Smoking status: Former Smoker    Years: 5.00    Types: Cigarettes    Quit date: 08/13/1986  . Smokeless tobacco: Never Used  . Alcohol use Yes     Comment: OCC.  . Drug use: No  . Sexual activity: Not Currently    Birth control/ protection: Post-menopausal, Abstinence   Other Topics Concern  . Not on file   Social History Narrative  . No narrative on file    Family History  Problem Relation Age of Onset  . Diabetes Mother   . Heart disease Father   . Cancer Brother 23       prostate  . Breast cancer Maternal Grandmother 88   ROS: All systems reviewed and negative except as per HPI.   Current Outpatient  Prescriptions  Medication Sig Dispense Refill  . amoxicillin (AMOXIL) 500 MG tablet Take 1 tablet (500 mg total) by mouth 2 (two) times daily. 14 tablet 0  . blood glucose meter kit and supplies KIT Dispense based on patient and insurance preference. Use up to four times daily as directed. (FOR ICD-9 250.00, 250.01). Check blood sugar BID. 1 each 0  . glucose blood test strip Use as instructed 100 each 12  . hyaluronate sodium (RADIAPLEXRX) GEL Apply 1 application topically once.    Marland Kitchen HYDROcodone-acetaminophen (NORCO/VICODIN) 5-325 MG tablet Take 1-2 tablets by mouth every 6 (six) hours as needed. 20 tablet 0  . ibuprofen (ADVIL,MOTRIN) 200 MG tablet Take 400-600 mg by mouth every 6 (six) hours as needed for headache or mild pain (depends on pain if takes 2-3 tablets).     Marland Kitchen ipratropium-albuterol (DUONEB) 0.5-2.5 (3) MG/3ML SOLN Inhale 3 mLs into the lungs every 4 (four) hours as needed (shortness of breath).     Elmore Guise Devices (ADJUSTABLE LANCING DEVICE) MISC 1 each by Does not apply route continuous. 1 each 0  . Lancets MISC 1 applicator by Does not apply route 2 (two) times daily. 60 each 0  . non-metallic deodorant (ALRA) MISC Apply 1 application topically daily as needed.    Marland Kitchen  PROAIR HFA 108 (90 Base) MCG/ACT inhaler Inhale 2 puffs into the lungs every 6 (six) hours as needed for wheezing or shortness of breath.     . sertraline (ZOLOFT) 50 MG tablet Take 1 tablet (50 mg total) by mouth daily. 90 tablet 3  . lisinopril (PRINIVIL,ZESTRIL) 10 MG tablet Take 1 tablet (10 mg total) by mouth daily. 90 tablet 3   No current facility-administered medications for this encounter.    BP (!) 158/86 (BP Location: Right Wrist, Patient Position: Sitting, Cuff Size: Normal)   Pulse 87   Wt (!) 324 lb 1.9 oz (147 kg)   LMP 06/30/2003   SpO2 93%   BMI 59.28 kg/m  General: NAD Neck: No JVD, no thyromegaly or thyroid nodule.  Lungs: Clear to auscultation bilaterally with normal respiratory  effort. CV: Nondisplaced PMI.  Heart regular S1/S2, no S3/S4, no murmur.  No peripheral edema.  No carotid bruit.  Normal pedal pulses.  Abdomen: Soft, nontender, no hepatosplenomegaly, no distention.  Skin: Intact without lesions or rashes.  Neurologic: Alert and oriented x 3.  Psych: Normal affect. Extremities: No clubbing or cyanosis.  HEENT: Normal.   Assessment/Plan: 1. Breast cancer: She started Herceptin-based therapy in 4/18, this will extend for a year.  I reviewed today's echo.  EF remains normal.  Strain images were technically difficult so I would not make clinical decisions on this.  - Continue Herceptin, repeat echo in 3 months with office visit.  2. HTN: BP high again today and has been high at home.   - Start lisinopril 10 mg daily, repeat BMET in 2 wks.    Loralie Champagne 03/10/2017

## 2017-03-10 NOTE — Progress Notes (Signed)
  Echocardiogram 2D Echocardiogram has been performed.  Sandra Wood 03/10/2017, 9:34 AM

## 2017-03-11 ENCOUNTER — Ambulatory Visit
Admission: RE | Admit: 2017-03-11 | Discharge: 2017-03-11 | Disposition: A | Discharge status: Home / Self Care | Payer: No Typology Code available for payment source | Source: Ambulatory Visit | Attending: Radiation Oncology | Admitting: Radiation Oncology

## 2017-03-11 DIAGNOSIS — Z510 Encounter for antineoplastic radiation therapy: Secondary | ICD-10-CM | POA: Diagnosis not present

## 2017-03-12 ENCOUNTER — Ambulatory Visit
Admission: RE | Admit: 2017-03-12 | Discharge: 2017-03-12 | Disposition: A | Discharge status: Home / Self Care | Payer: No Typology Code available for payment source | Source: Ambulatory Visit | Attending: Radiation Oncology | Admitting: Radiation Oncology

## 2017-03-12 DIAGNOSIS — Z510 Encounter for antineoplastic radiation therapy: Secondary | ICD-10-CM | POA: Diagnosis not present

## 2017-03-15 ENCOUNTER — Ambulatory Visit: Payer: No Typology Code available for payment source

## 2017-03-15 ENCOUNTER — Ambulatory Visit
Admission: RE | Admit: 2017-03-15 | Discharge: 2017-03-15 | Disposition: A | Discharge status: Home / Self Care | Payer: No Typology Code available for payment source | Source: Ambulatory Visit | Attending: Radiation Oncology | Admitting: Radiation Oncology

## 2017-03-15 DIAGNOSIS — Z510 Encounter for antineoplastic radiation therapy: Secondary | ICD-10-CM | POA: Diagnosis not present

## 2017-03-16 ENCOUNTER — Ambulatory Visit
Admission: RE | Admit: 2017-03-16 | Discharge: 2017-03-16 | Disposition: A | Discharge status: Home / Self Care | Payer: No Typology Code available for payment source | Source: Ambulatory Visit | Attending: Radiation Oncology | Admitting: Radiation Oncology

## 2017-03-16 DIAGNOSIS — Z510 Encounter for antineoplastic radiation therapy: Secondary | ICD-10-CM | POA: Diagnosis not present

## 2017-03-17 ENCOUNTER — Encounter: Payer: Self-pay | Admitting: Radiation Oncology

## 2017-03-17 NOTE — Progress Notes (Signed)
  Radiation Oncology         [phone] ________________________________  Name: Sandra Wood MRN: [id number]  Date: 03/17/2017  DOB: [birth year]  End of Treatment Note  Diagnosis:  Pathologic stage pT1c,pN0 high grade invasive ductal carcinoma of the left breast (triple positive).     Indication for treatment:  Curative       Radiation treatment dates:   01/27/2017-03/16/2017  Site/dose:   1. Left breast, 1.8 Gy in 28 fractions           2. Boost, 2 Gy in 6 fractions    Beams/energy:   1. 3D, 15X and 10X         2. Electron, 18E  Narrative: The patient tolerated radiation treatment relatively well. At the start of her radiation treatment, she noted fatigue and a dry area localized to her left nipple. Pt reported that she was using radiaplex at that time. At the end of her treatment she noted a dry cough and that the skin on her left breast was dry and red. Pt moist desquamation from previous visits was healed up with erythema in the boost region noted. Pt was advised to continue use of radiaplex and antifungal powder.     Plan: The patient has completed radiation treatment. The patient will return to radiation oncology clinic for routine followup in one month. I advised them to call or return sooner if they have any questions or concerns related to their recovery or treatment.  -----------------------------------  Blair Promise, PhD, MD  This document serves as a record of services personally performed by Gery Pray, MD. It was created on her behalf by Steva Colder, a trained medical scribe. The creation of this record is based on the scribe's personal observations and the provider's statements to them. This document has been checked and approved by the attending provider.

## 2017-03-26 ENCOUNTER — Other Ambulatory Visit: Payer: Self-pay

## 2017-03-26 DIAGNOSIS — Z170 Estrogen receptor positive status [ER+]: Principal | ICD-10-CM

## 2017-03-26 DIAGNOSIS — C50312 Malignant neoplasm of lower-inner quadrant of left female breast: Secondary | ICD-10-CM

## 2017-03-29 ENCOUNTER — Encounter: Payer: Self-pay | Admitting: *Deleted

## 2017-03-29 ENCOUNTER — Other Ambulatory Visit (HOSPITAL_BASED_OUTPATIENT_CLINIC_OR_DEPARTMENT_OTHER): Payer: No Typology Code available for payment source

## 2017-03-29 ENCOUNTER — Telehealth: Payer: Self-pay | Admitting: Hematology and Oncology

## 2017-03-29 ENCOUNTER — Ambulatory Visit: Payer: No Typology Code available for payment source

## 2017-03-29 ENCOUNTER — Ambulatory Visit (HOSPITAL_BASED_OUTPATIENT_CLINIC_OR_DEPARTMENT_OTHER): Payer: No Typology Code available for payment source | Admitting: Hematology and Oncology

## 2017-03-29 ENCOUNTER — Ambulatory Visit (HOSPITAL_BASED_OUTPATIENT_CLINIC_OR_DEPARTMENT_OTHER): Payer: No Typology Code available for payment source

## 2017-03-29 DIAGNOSIS — Z170 Estrogen receptor positive status [ER+]: Secondary | ICD-10-CM

## 2017-03-29 DIAGNOSIS — C50312 Malignant neoplasm of lower-inner quadrant of left female breast: Secondary | ICD-10-CM

## 2017-03-29 DIAGNOSIS — Z23 Encounter for immunization: Secondary | ICD-10-CM | POA: Diagnosis not present

## 2017-03-29 DIAGNOSIS — Z95828 Presence of other vascular implants and grafts: Secondary | ICD-10-CM

## 2017-03-29 DIAGNOSIS — Z5112 Encounter for antineoplastic immunotherapy: Secondary | ICD-10-CM

## 2017-03-29 DIAGNOSIS — Z0000 Encounter for general adult medical examination without abnormal findings: Secondary | ICD-10-CM

## 2017-03-29 LAB — CBC WITH DIFFERENTIAL/PLATELET
BASO%: 0.2 % (ref 0.0–2.0)
BASOS ABS: 0 10*3/uL (ref 0.0–0.1)
EOS%: 4 % (ref 0.0–7.0)
Eosinophils Absolute: 0.4 10*3/uL (ref 0.0–0.5)
HCT: 44.4 % (ref 34.8–46.6)
HGB: 13.8 g/dL (ref 11.6–15.9)
LYMPH%: 22.6 % (ref 14.0–49.7)
MCH: 27.9 pg (ref 25.1–34.0)
MCHC: 31.1 g/dL — AB (ref 31.5–36.0)
MCV: 89.9 fL (ref 79.5–101.0)
MONO#: 0.7 10*3/uL (ref 0.1–0.9)
MONO%: 7.7 % (ref 0.0–14.0)
NEUT#: 5.9 10*3/uL (ref 1.5–6.5)
NEUT%: 65.5 % (ref 38.4–76.8)
Platelets: 273 10*3/uL (ref 145–400)
RBC: 4.94 10*6/uL (ref 3.70–5.45)
RDW: 14.3 % (ref 11.2–14.5)
WBC: 8.9 10*3/uL (ref 3.9–10.3)
lymph#: 2 10*3/uL (ref 0.9–3.3)

## 2017-03-29 LAB — COMPREHENSIVE METABOLIC PANEL
ALT: 14 U/L (ref 0–55)
AST: 13 U/L (ref 5–34)
Albumin: 3.2 g/dL — ABNORMAL LOW (ref 3.5–5.0)
Alkaline Phosphatase: 130 U/L (ref 40–150)
Anion Gap: 10 mEq/L (ref 3–11)
BUN: 11.5 mg/dL (ref 7.0–26.0)
CHLORIDE: 104 meq/L (ref 98–109)
CO2: 25 mEq/L (ref 22–29)
Calcium: 9.7 mg/dL (ref 8.4–10.4)
Creatinine: 0.8 mg/dL (ref 0.6–1.1)
EGFR: 82 mL/min/{1.73_m2} — ABNORMAL LOW (ref >90)
GLUCOSE: 146 mg/dL — AB (ref 70–140)
POTASSIUM: 4 meq/L (ref 3.5–5.1)
SODIUM: 140 meq/L (ref 136–145)
Total Bilirubin: 0.52 mg/dL (ref 0.20–1.20)
Total Protein: 7.7 g/dL (ref 6.4–8.3)

## 2017-03-29 MED ORDER — ACETAMINOPHEN 325 MG PO TABS
ORAL_TABLET | ORAL | Status: AC
  Filled 2017-03-29: qty 2

## 2017-03-29 MED ORDER — SODIUM CHLORIDE 0.9% FLUSH
10.0000 mL | INTRAVENOUS | Status: DC | PRN
  Administered 2017-03-29: 10 mL via INTRAVENOUS
  Filled 2017-03-29: qty 10

## 2017-03-29 MED ORDER — ACETAMINOPHEN 325 MG PO TABS
650.0000 mg | ORAL_TABLET | Freq: Once | ORAL | Status: AC
  Administered 2017-03-29: 650 mg via ORAL

## 2017-03-29 MED ORDER — INFLUENZA VAC SPLIT QUAD 0.5 ML IM SUSY
0.5000 mL | PREFILLED_SYRINGE | Freq: Once | INTRAMUSCULAR | Status: AC
  Administered 2017-03-29: 0.5 mL via INTRAMUSCULAR
  Filled 2017-03-29: qty 0.5

## 2017-03-29 MED ORDER — DIPHENHYDRAMINE HCL 25 MG PO CAPS
25.0000 mg | ORAL_CAPSULE | Freq: Once | ORAL | Status: AC
  Administered 2017-03-29: 25 mg via ORAL

## 2017-03-29 MED ORDER — DIPHENHYDRAMINE HCL 25 MG PO CAPS
ORAL_CAPSULE | ORAL | Status: AC
  Filled 2017-03-29: qty 1

## 2017-03-29 MED ORDER — SODIUM CHLORIDE 0.9 % IV SOLN
Freq: Once | INTRAVENOUS | Status: AC
  Administered 2017-03-29: 11:00:00 via INTRAVENOUS

## 2017-03-29 MED ORDER — HEPARIN SOD (PORK) LOCK FLUSH 100 UNIT/ML IV SOLN
500.0000 [IU] | Freq: Once | INTRAVENOUS | Status: AC | PRN
  Administered 2017-03-29: 500 [IU]
  Filled 2017-03-29: qty 5

## 2017-03-29 MED ORDER — SODIUM CHLORIDE 0.9 % IV SOLN
900.0000 mg | Freq: Once | INTRAVENOUS | Status: AC
  Administered 2017-03-29: 900 mg via INTRAVENOUS
  Filled 2017-03-29: qty 42.86

## 2017-03-29 MED ORDER — SODIUM CHLORIDE 0.9% FLUSH
10.0000 mL | INTRAVENOUS | Status: DC | PRN
  Administered 2017-03-29: 10 mL
  Filled 2017-03-29: qty 10

## 2017-03-29 NOTE — Patient Instructions (Signed)
[facility] Discharge Instructions for Patients Receiving Chemotherapy  Today you received the following chemotherapy agents: Herceptin   To help prevent nausea and vomiting after your treatment, we encourage you to take your nausea medication as directed.    If you develop nausea and vomiting that is not controlled by your nausea medication, call the clinic.   BELOW ARE SYMPTOMS THAT SHOULD BE REPORTED IMMEDIATELY:  *FEVER GREATER THAN 100.5 F  *CHILLS WITH OR WITHOUT FEVER  NAUSEA AND VOMITING THAT IS NOT CONTROLLED WITH YOUR NAUSEA MEDICATION  *UNUSUAL SHORTNESS OF BREATH  *UNUSUAL BRUISING OR BLEEDING  TENDERNESS IN MOUTH AND THROAT WITH OR WITHOUT PRESENCE OF ULCERS  *URINARY PROBLEMS  *BOWEL PROBLEMS  UNUSUAL RASH Items with * indicate a potential emergency and should be followed up as soon as possible.  Feel free to call the clinic you have any questions or concerns. The clinic phone number is [phone].  Please show the CHEMO ALERT CARD at check-in to the Emergency Department and triage nurse.   

## 2017-03-29 NOTE — Progress Notes (Signed)
Patient Care Team: Janith Lima, MD as PCP - General (Internal Medicine) Alphonsa Overall, MD as Consulting Physician (General Surgery) Nicholas Lose, MD as Consulting Physician (Hematology and Oncology) Eppie Gibson, MD as Attending Physician (Radiation Oncology)  DIAGNOSIS:  Encounter Diagnosis  Name Primary?  . Malignant neoplasm of lower-inner quadrant of left breast in female, estrogen receptor positive ([place])     SUMMARY OF ONCOLOGIC HISTORY:   Malignant neoplasm of lower-inner quadrant of left breast in female, estrogen receptor positive ([place])   08/18/2016 Initial Diagnosis    Left breast biopsy 8:00 retroareolar position: IDC, high-grade with DCIS high-grade, ER 90%, PR 95%, Ki-67 20%, HER-2 positive ratio 3.4; screening detected left breast distortion LIQ 8 8:00 retroareolar 0.8 cm, axilla negative, T1b N0 stage IA clinical stage      09/22/2016 Surgery    Left lumpectomy: IDC grade 3, 1.3 cm, DCIS high-grade, margins clear, 0/1 lymph node negative, T1CN 0 stage IA, ER 90%, PR 95%, Ki-67 20%, HER-2 positive ratio 3.4      10/19/2016 - 01/04/2017 Chemotherapy    Taxol Herceptin weekly 12 followed by Herceptin maintenance every 3 weeks for 1 year        CHIEF COMPLIANT: Herceptin maintenance therapy  INTERVAL HISTORY: Sandra Wood is a 61 year old with above-mentioned history of left breast cancer treated with lumpectomy followed by adjuvant chemotherapy. She finished radiation therapy recently and is here to receive maintenance Herceptin. She is tolerating Herceptin extremely well. Recent echocardiograms were normal. She was noted to have mild hypertension and was started on lisinopril.  REVIEW OF SYSTEMS:   Constitutional: Denies fevers, chills or abnormal weight loss Eyes: Denies blurriness of vision Ears, nose, mouth, throat, and face: Denies mucositis or sore throat Respiratory: Denies cough, dyspnea or wheezes Cardiovascular: Denies palpitation, chest  discomfort Gastrointestinal:  Denies nausea, heartburn or change in bowel habits Skin: Denies abnormal skin rashes Lymphatics: Denies new lymphadenopathy or easy bruising Neurological:Denies numbness, tingling or new weaknesses Behavioral/Psych: Mood is stable, no new changes  Extremities: No lower extremity edema All other systems were reviewed with the patient and are negative.  I have reviewed the past medical history, past surgical history, social history and family history with the patient and they are unchanged from previous note.  ALLERGIES:  is allergic to no known allergies.  MEDICATIONS:  Current Outpatient Prescriptions  Medication Sig Dispense Refill  . blood glucose meter kit and supplies KIT Dispense based on patient and insurance preference. Use up to four times daily as directed. (FOR ICD-9 250.00, 250.01). Check blood sugar BID. 1 each 0  . glucose blood test strip Use as instructed 100 each 12  . ibuprofen (ADVIL,MOTRIN) 200 MG tablet Take 400-600 mg by mouth every 6 (six) hours as needed for headache or mild pain (depends on pain if takes 2-3 tablets).     Marland Kitchen ipratropium-albuterol (DUONEB) 0.5-2.5 (3) MG/3ML SOLN Inhale 3 mLs into the lungs every 4 (four) hours as needed (shortness of breath).     [employer] Guise Devices (ADJUSTABLE LANCING DEVICE) MISC 1 each by Does not apply route continuous. 1 each 0  . Lancets MISC 1 applicator by Does not apply route 2 (two) times daily. 60 each 0  . lisinopril (PRINIVIL,ZESTRIL) 10 MG tablet Take 1 tablet (10 mg total) by mouth daily. 90 tablet 3  . PROAIR HFA 108 (90 Base) MCG/ACT inhaler Inhale 2 puffs into the lungs every 6 (six) hours as needed for wheezing or shortness of breath.  No current facility-administered medications for this visit.     PHYSICAL EXAMINATION: ECOG PERFORMANCE STATUS: 1 - Symptomatic but completely ambulatory  Vitals:   03/29/17 0946  BP: (!) 143/73  Pulse: 84  Resp: 18  Temp: 98.4 F (36.9 C)    SpO2: 93%   Filed Weights   03/29/17 0946  Weight: (!) 321 lb 8 oz (145.8 kg)    GENERAL:alert, no distress and comfortable SKIN: skin color, texture, turgor are normal, no rashes or significant lesions EYES: normal, Conjunctiva are pink and non-injected, sclera clear OROPHARYNX:no exudate, no erythema and lips, buccal mucosa, and tongue normal  NECK: supple, thyroid normal size, non-tender, without nodularity LYMPH:  no palpable lymphadenopathy in the cervical, axillary or inguinal LUNGS: clear to auscultation and percussion with normal breathing effort HEART: regular rate & rhythm and no murmurs and no lower extremity edema ABDOMEN:abdomen soft, non-tender and normal bowel sounds MUSCULOSKELETAL:no cyanosis of digits and no clubbing  NEURO: alert & oriented x 3 with fluent speech, no focal motor/sensory deficits EXTREMITIES: No lower extremity edema  LABORATORY DATA:  I have reviewed the data as listed   Chemistry      Component Value Date/Time   NA 140 03/08/2017 1040   K 4.0 03/08/2017 1040   CL 103 09/15/2016 1255   CO2 26 03/08/2017 1040   BUN 9.6 03/08/2017 1040   CREATININE 0.7 03/08/2017 1040      Component Value Date/Time   CALCIUM 9.6 03/08/2017 1040   ALKPHOS 128 03/08/2017 1040   AST 13 03/08/2017 1040   ALT 13 03/08/2017 1040   BILITOT 0.57 03/08/2017 1040       Lab Results  Component Value Date   WBC 8.9 03/29/2017   HGB 13.8 03/29/2017   HCT 44.4 03/29/2017   MCV 89.9 03/29/2017   PLT 273 03/29/2017   NEUTROABS 5.9 03/29/2017    ASSESSMENT & PLAN:  Malignant neoplasm of lower-inner quadrant of left breast in female, estrogen receptor positive ([facility]) Left breast biopsy02/20/2018:8:00 retroareolar position: IDC, high-grade with DCIS high-grade, ER 90%, PR 95%, Ki-67 20%, HER-2 positive ratio 3.4; screening detected left breast distortion LIQ 8 8:00 retroareolar 0.8 cm, axilla negative, T1b N0 stage IA clinical stage Left lumpectomy 09/22/2016:  IDC grade 3, 1.3 cm, DCIS high-grade, margins clear, 0/1 lymph node negative, T1 CN 0 stage IA, ER 90%, PR 95%, Ki-67 20%, HER-2 positive ratio 3.4  Recommendation: 1. Adjuvant chemotherapy and Herceptin with weekly Taxol and Herceptin 12 completed 01/04/2017 followed by Herceptin maintenance for 1 year 3. Followed by adjuvant radiation  4. Followed by adjuvant antiestrogen therapy with letrozole 2.5 mg daily 5 years ----------------------------------------------------------------------------- Current treatment: Herceptin maintenance Diabetes: Patient being more careful about her diet We had lengthy discussion about the optimal duration of Herceptin. After thorough consideration, I recommended 1 year of anti-HER-2 therapy.  We need to discuss antiestrogen therapy when she returns back to see me in 6 weeks. Return to clinic every 3 weeks for Herceptin every 6 weeks for follow-up with me  I spent 25 minutes talking to the patient of which more than half was spent in counseling and coordination of care.  No orders of the defined types were placed in this encounter.  The patient has a good understanding of the overall plan. she agrees with it. she will call with any problems that may develop before the next visit here.   Rulon Eisenmenger, MD 03/29/17

## 2017-03-29 NOTE — Telephone Encounter (Signed)
Gave patient avs and calendar per 10/1 los

## 2017-03-29 NOTE — Assessment & Plan Note (Signed)
Left breast biopsy02/20/2018:8:00 retroareolar position: IDC, high-grade with DCIS high-grade, ER 90%, PR 95%, Ki-67 20%, HER-2 positive ratio 3.4; screening detected left breast distortion LIQ 8 8:00 retroareolar 0.8 cm, axilla negative, T1b N0 stage IA clinical stage Left lumpectomy 09/22/2016: IDC grade 3, 1.3 cm, DCIS high-grade, margins clear, 0/1 lymph node negative, T1 CN 0 stage IA, ER 90%, PR 95%, Ki-67 20%, HER-2 positive ratio 3.4  Recommendation: 1. Adjuvant chemotherapy and Herceptin with weekly Taxol and Herceptin 12 completed 01/04/2017 followed by Herceptin maintenance for 1 year 3. Followed by adjuvant radiation  4. Followed by adjuvant antiestrogen therapy with letrozole 2.5 mg daily 5 years ----------------------------------------------------------------------------- Current treatment: Herceptin maintenance Diabetes: Patient being more careful about her diet We had lengthy discussion about the optimal duration of Herceptin. After thorough consideration, I recommended 1 year of anti-HER-2 therapy. Return to clinic every 3 weeks for Herceptin every 6 weeks for follow-up with me

## 2017-04-09 ENCOUNTER — Encounter: Payer: Self-pay | Admitting: Oncology

## 2017-04-12 ENCOUNTER — Encounter: Payer: Self-pay | Admitting: Radiation Oncology

## 2017-04-12 ENCOUNTER — Ambulatory Visit
Admission: RE | Admit: 2017-04-12 | Discharge: 2017-04-12 | Disposition: A | Discharge status: Home / Self Care | Payer: No Typology Code available for payment source | Source: Ambulatory Visit | Attending: Radiation Oncology | Admitting: Radiation Oncology

## 2017-04-12 DIAGNOSIS — Z510 Encounter for antineoplastic radiation therapy: Secondary | ICD-10-CM | POA: Insufficient documentation

## 2017-04-12 DIAGNOSIS — Z9221 Personal history of antineoplastic chemotherapy: Secondary | ICD-10-CM | POA: Insufficient documentation

## 2017-04-12 DIAGNOSIS — C50312 Malignant neoplasm of lower-inner quadrant of left female breast: Secondary | ICD-10-CM | POA: Diagnosis not present

## 2017-04-12 DIAGNOSIS — Z170 Estrogen receptor positive status [ER+]: Secondary | ICD-10-CM | POA: Insufficient documentation

## 2017-04-12 DIAGNOSIS — Z79899 Other long term (current) drug therapy: Secondary | ICD-10-CM | POA: Diagnosis not present

## 2017-04-12 NOTE — Progress Notes (Signed)
Radiation Oncology         [phone] ________________________________  Name: Sandra Wood MRN: [id number]  Date: 04/12/2017  DOB: January 25, 1956  Follow-Up Visit Note  CC: Janith Lima, MD  Janith Lima, MD    ICD-10-CM   1. Malignant neoplasm of lower-inner quadrant of left breast in female, estrogen receptor positive ([place]) C50.312    Z17.0     Diagnosis: pathologicstage pT1c,pN0high grade invasive ductal carcinoma of the left breast (triple positive).  Interval Since Last Radiation:  1 months  Narrative:  The patient returns today for routine follow-up. Overall she is doing well. She reports that she continues to have occasional sharp pain within the left breast. She is still taking Herceptin; she was originally going to stop this on Oct 1st, but Dr Lindi Adie has informed her to continue to take this until the end of the year. She tolerates this well without complication. She has not been started on other anti-estrogen therapies as of yet. She denies nipple discharge, bleeding, or any other associated symptoms.                          ALLERGIES:  is allergic to no known allergies.  Meds: Current Outpatient Prescriptions  Medication Sig Dispense Refill  . blood glucose meter kit and supplies KIT Dispense based on patient and insurance preference. Use up to four times daily as directed. (FOR ICD-9 250.00, 250.01). Check blood sugar BID. 1 each 0  . glucose blood test strip Use as instructed 100 each 12  . lisinopril (PRINIVIL,ZESTRIL) 10 MG tablet Take 1 tablet (10 mg total) by mouth daily. 90 tablet 3  . ibuprofen (ADVIL,MOTRIN) 200 MG tablet Take 400-600 mg by mouth every 6 (six) hours as needed for headache or mild pain (depends on pain if takes 2-3 tablets).     Marland Kitchen ipratropium-albuterol (DUONEB) 0.5-2.5 (3) MG/3ML SOLN Inhale 3 mLs into the lungs every 4 (four) hours as needed (shortness of breath).     Marland Kitchen PROAIR HFA 108 (90 Base) MCG/ACT inhaler Inhale 2 puffs into the  lungs every 6 (six) hours as needed for wheezing or shortness of breath.      No current facility-administered medications for this encounter.     Physical Findings: The patient is in no acute distress. Patient is alert and oriented.  height is '5\' 2"'$  (1.575 m) and weight is 320 lb 6.4 oz (145.3 kg) (abnormal). Her oral temperature is 98.7 F (37.1 C). Her blood pressure is 138/81 and her pulse is 75. Her oxygen saturation is 96%. .  No significant changes. Lungs are clear to auscultation bilaterally. Heart has regular rate and rhythm. No palpable cervical, supraclavicular, or axillary adenopathy. Abdomen soft, non-tender, normal bowel sounds. Port-o-cath is in place within the right chest wall without signs of infection. Breast: Left breast pt's skin is well healed. There is some slight erythema to the breast. No palpable mass, nipple discharge, or bleeding.   Lab Findings: Lab Results  Component Value Date   WBC 8.9 03/29/2017   HGB 13.8 03/29/2017   HCT 44.4 03/29/2017   MCV 89.9 03/29/2017   PLT 273 03/29/2017    Radiographic Findings: No results found.  Impression:  The patient is recovering from the effects of radiation.  No clinical evidence of recurrence. Skin healed well  Plan: The pt will continue on Herceptin and will begin Letrozole following her next appt w/ Gudena in 4 weeks. PRN f/u  in radiation oncology.    ____________________________________ -----------------------------------  Blair Promise, PhD, MD  This document serves as a record of services personally performed by Gery Pray, MD. It was created on his behalf by Reola Mosher, a trained medical scribe. The creation of this record is based on the scribe's personal observations and the provider's statements to them. This document has been checked and approved by the attending provider.

## 2017-04-12 NOTE — Progress Notes (Signed)
Sandra Wood is here for follow up after treatment to her left breast.  She reports having some discomfort in her left breast.  She also has had a cough and has had some soreness of her right porta cath when she coughs.  She does have fatigue.  The skin on left breast is pink with some dry peeling on the side of the breast.  BP 138/81 (BP Location: Right Wrist, Patient Position: Sitting)   Pulse 75   Temp 98.7 F (37.1 C) (Oral)   Ht 5\' 2"  (1.575 m)   Wt (!) 320 lb 6.4 oz (145.3 kg)   LMP 06/30/2003   SpO2 96%   BMI 58.60 kg/m    Wt Readings from Last 3 Encounters:  04/12/17 (!) 320 lb 6.4 oz (145.3 kg)  03/29/17 (!) 321 lb 8 oz (145.8 kg)  03/10/17 (!) 324 lb 1.9 oz (147 kg)

## 2017-04-16 ENCOUNTER — Other Ambulatory Visit: Payer: Self-pay

## 2017-04-16 DIAGNOSIS — Z170 Estrogen receptor positive status [ER+]: Principal | ICD-10-CM

## 2017-04-16 DIAGNOSIS — C50312 Malignant neoplasm of lower-inner quadrant of left female breast: Secondary | ICD-10-CM

## 2017-04-19 ENCOUNTER — Ambulatory Visit (HOSPITAL_BASED_OUTPATIENT_CLINIC_OR_DEPARTMENT_OTHER): Payer: No Typology Code available for payment source

## 2017-04-19 ENCOUNTER — Ambulatory Visit: Payer: No Typology Code available for payment source

## 2017-04-19 ENCOUNTER — Other Ambulatory Visit (HOSPITAL_BASED_OUTPATIENT_CLINIC_OR_DEPARTMENT_OTHER): Payer: No Typology Code available for payment source

## 2017-04-19 ENCOUNTER — Ambulatory Visit: Payer: No Typology Code available for payment source | Admitting: Hematology and Oncology

## 2017-04-19 VITALS — BP 114/51 | HR 76 | Resp 18

## 2017-04-19 DIAGNOSIS — Z170 Estrogen receptor positive status [ER+]: Principal | ICD-10-CM

## 2017-04-19 DIAGNOSIS — C50312 Malignant neoplasm of lower-inner quadrant of left female breast: Secondary | ICD-10-CM | POA: Diagnosis not present

## 2017-04-19 DIAGNOSIS — Z5112 Encounter for antineoplastic immunotherapy: Secondary | ICD-10-CM

## 2017-04-19 LAB — COMPREHENSIVE METABOLIC PANEL
ALT: 15 U/L (ref 0–55)
ANION GAP: 11 meq/L (ref 3–11)
AST: 13 U/L (ref 5–34)
Albumin: 3.2 g/dL — ABNORMAL LOW (ref 3.5–5.0)
Alkaline Phosphatase: 116 U/L (ref 40–150)
BUN: 11.5 mg/dL (ref 7.0–26.0)
CHLORIDE: 105 meq/L (ref 98–109)
CO2: 24 meq/L (ref 22–29)
Calcium: 9.2 mg/dL (ref 8.4–10.4)
Creatinine: 0.7 mg/dL (ref 0.6–1.1)
Glucose: 143 mg/dl — ABNORMAL HIGH (ref 70–140)
Potassium: 3.7 mEq/L (ref 3.5–5.1)
Sodium: 140 mEq/L (ref 136–145)
Total Bilirubin: 0.51 mg/dL (ref 0.20–1.20)
Total Protein: 7.6 g/dL (ref 6.4–8.3)

## 2017-04-19 LAB — CBC WITH DIFFERENTIAL/PLATELET
BASO%: 0.5 % (ref 0.0–2.0)
Basophils Absolute: 0.1 10*3/uL (ref 0.0–0.1)
EOS%: 2.7 % (ref 0.0–7.0)
Eosinophils Absolute: 0.3 10*3/uL (ref 0.0–0.5)
HCT: 43.6 % (ref 34.8–46.6)
HGB: 14.1 g/dL (ref 11.6–15.9)
LYMPH%: 20 % (ref 14.0–49.7)
MCH: 27.3 pg (ref 25.1–34.0)
MCHC: 32.4 g/dL (ref 31.5–36.0)
MCV: 84.1 fL (ref 79.5–101.0)
MONO#: 0.9 10*3/uL (ref 0.1–0.9)
MONO%: 8 % (ref 0.0–14.0)
NEUT#: 7.5 10*3/uL — ABNORMAL HIGH (ref 1.5–6.5)
NEUT%: 68.8 % (ref 38.4–76.8)
PLATELETS: 302 10*3/uL (ref 145–400)
RBC: 5.18 10*6/uL (ref 3.70–5.45)
RDW: 15 % — ABNORMAL HIGH (ref 11.2–14.5)
WBC: 10.8 10*3/uL — ABNORMAL HIGH (ref 3.9–10.3)
lymph#: 2.2 10*3/uL (ref 0.9–3.3)

## 2017-04-19 MED ORDER — ACETAMINOPHEN 325 MG PO TABS
ORAL_TABLET | ORAL | Status: AC
  Filled 2017-04-19: qty 2

## 2017-04-19 MED ORDER — HEPARIN SOD (PORK) LOCK FLUSH 100 UNIT/ML IV SOLN
500.0000 [IU] | Freq: Once | INTRAVENOUS | Status: AC | PRN
  Administered 2017-04-19: 500 [IU]
  Filled 2017-04-19: qty 5

## 2017-04-19 MED ORDER — SODIUM CHLORIDE 0.9% FLUSH
10.0000 mL | INTRAVENOUS | Status: DC | PRN
  Administered 2017-04-19: 10 mL
  Filled 2017-04-19: qty 10

## 2017-04-19 MED ORDER — SODIUM CHLORIDE 0.9 % IV SOLN
Freq: Once | INTRAVENOUS | Status: AC
  Administered 2017-04-19: 09:00:00 via INTRAVENOUS

## 2017-04-19 MED ORDER — TRASTUZUMAB CHEMO 150 MG IV SOLR
900.0000 mg | Freq: Once | INTRAVENOUS | Status: AC
  Administered 2017-04-19: 900 mg via INTRAVENOUS
  Filled 2017-04-19: qty 42.86

## 2017-04-19 MED ORDER — DIPHENHYDRAMINE HCL 25 MG PO CAPS
ORAL_CAPSULE | ORAL | Status: AC
  Filled 2017-04-19: qty 1

## 2017-04-19 MED ORDER — ACETAMINOPHEN 325 MG PO TABS
650.0000 mg | ORAL_TABLET | Freq: Once | ORAL | Status: AC
  Administered 2017-04-19: 650 mg via ORAL

## 2017-04-19 MED ORDER — DIPHENHYDRAMINE HCL 25 MG PO CAPS
25.0000 mg | ORAL_CAPSULE | Freq: Once | ORAL | Status: AC
  Administered 2017-04-19: 25 mg via ORAL

## 2017-04-19 NOTE — Patient Instructions (Signed)
[facility] Discharge Instructions for Patients Receiving Chemotherapy  Today you received the following chemotherapy agents: Herceptin   To help prevent nausea and vomiting after your treatment, we encourage you to take your nausea medication as directed.    If you develop nausea and vomiting that is not controlled by your nausea medication, call the clinic.   BELOW ARE SYMPTOMS THAT SHOULD BE REPORTED IMMEDIATELY:  *FEVER GREATER THAN 100.5 F  *CHILLS WITH OR WITHOUT FEVER  NAUSEA AND VOMITING THAT IS NOT CONTROLLED WITH YOUR NAUSEA MEDICATION  *UNUSUAL SHORTNESS OF BREATH  *UNUSUAL BRUISING OR BLEEDING  TENDERNESS IN MOUTH AND THROAT WITH OR WITHOUT PRESENCE OF ULCERS  *URINARY PROBLEMS  *BOWEL PROBLEMS  UNUSUAL RASH Items with * indicate a potential emergency and should be followed up as soon as possible.  Feel free to call the clinic you have any questions or concerns. The clinic phone number is [phone].  Please show the CHEMO ALERT CARD at check-in to the Emergency Department and triage nurse.   

## 2017-05-07 ENCOUNTER — Other Ambulatory Visit: Payer: Self-pay

## 2017-05-07 DIAGNOSIS — C50312 Malignant neoplasm of lower-inner quadrant of left female breast: Secondary | ICD-10-CM

## 2017-05-07 DIAGNOSIS — Z170 Estrogen receptor positive status [ER+]: Principal | ICD-10-CM

## 2017-05-10 ENCOUNTER — Telehealth: Payer: Self-pay | Admitting: Hematology and Oncology

## 2017-05-10 ENCOUNTER — Ambulatory Visit: Payer: No Typology Code available for payment source

## 2017-05-10 ENCOUNTER — Ambulatory Visit (HOSPITAL_BASED_OUTPATIENT_CLINIC_OR_DEPARTMENT_OTHER): Payer: No Typology Code available for payment source

## 2017-05-10 ENCOUNTER — Encounter: Payer: Self-pay | Admitting: *Deleted

## 2017-05-10 ENCOUNTER — Encounter: Payer: Self-pay | Admitting: Hematology and Oncology

## 2017-05-10 ENCOUNTER — Ambulatory Visit (HOSPITAL_BASED_OUTPATIENT_CLINIC_OR_DEPARTMENT_OTHER): Payer: No Typology Code available for payment source | Admitting: Hematology and Oncology

## 2017-05-10 ENCOUNTER — Other Ambulatory Visit (HOSPITAL_BASED_OUTPATIENT_CLINIC_OR_DEPARTMENT_OTHER): Payer: No Typology Code available for payment source

## 2017-05-10 VITALS — BP 141/61 | HR 85 | Temp 98.5°F | Resp 20 | Ht 62.0 in | Wt 320.5 lb

## 2017-05-10 DIAGNOSIS — Z170 Estrogen receptor positive status [ER+]: Principal | ICD-10-CM

## 2017-05-10 DIAGNOSIS — Z5112 Encounter for antineoplastic immunotherapy: Secondary | ICD-10-CM | POA: Diagnosis not present

## 2017-05-10 DIAGNOSIS — C50312 Malignant neoplasm of lower-inner quadrant of left female breast: Secondary | ICD-10-CM | POA: Diagnosis not present

## 2017-05-10 DIAGNOSIS — Z95828 Presence of other vascular implants and grafts: Secondary | ICD-10-CM

## 2017-05-10 DIAGNOSIS — Z780 Asymptomatic menopausal state: Secondary | ICD-10-CM

## 2017-05-10 LAB — CBC WITH DIFFERENTIAL/PLATELET
BASO%: 1.1 % (ref 0.0–2.0)
BASOS ABS: 0.1 10*3/uL (ref 0.0–0.1)
EOS ABS: 0.3 10*3/uL (ref 0.0–0.5)
EOS%: 2.8 % (ref 0.0–7.0)
HCT: 45 % (ref 34.8–46.6)
HEMOGLOBIN: 14.6 g/dL (ref 11.6–15.9)
LYMPH%: 18.9 % (ref 14.0–49.7)
MCH: 26.6 pg (ref 25.1–34.0)
MCHC: 32.4 g/dL (ref 31.5–36.0)
MCV: 82.2 fL (ref 79.5–101.0)
MONO#: 0.7 10*3/uL (ref 0.1–0.9)
MONO%: 6.9 % (ref 0.0–14.0)
NEUT%: 70.3 % (ref 38.4–76.8)
NEUTROS ABS: 7.4 10*3/uL — AB (ref 1.5–6.5)
PLATELETS: 326 10*3/uL (ref 145–400)
RBC: 5.48 10*6/uL — ABNORMAL HIGH (ref 3.70–5.45)
RDW: 15.4 % — AB (ref 11.2–14.5)
WBC: 10.6 10*3/uL — AB (ref 3.9–10.3)
lymph#: 2 10*3/uL (ref 0.9–3.3)

## 2017-05-10 LAB — COMPREHENSIVE METABOLIC PANEL
ALT: 17 U/L (ref 0–55)
ANION GAP: 9 meq/L (ref 3–11)
AST: 13 U/L (ref 5–34)
Albumin: 3.2 g/dL — ABNORMAL LOW (ref 3.5–5.0)
Alkaline Phosphatase: 128 U/L (ref 40–150)
BUN: 14.4 mg/dL (ref 7.0–26.0)
CALCIUM: 9.5 mg/dL (ref 8.4–10.4)
CHLORIDE: 103 meq/L (ref 98–109)
CO2: 24 meq/L (ref 22–29)
CREATININE: 0.8 mg/dL (ref 0.6–1.1)
EGFR: >60 mL/min/{1.73_m2} (ref >60)
Glucose: 141 mg/dl — ABNORMAL HIGH (ref 70–140)
POTASSIUM: 4.3 meq/L (ref 3.5–5.1)
Sodium: 137 mEq/L (ref 136–145)
Total Bilirubin: 0.54 mg/dL (ref 0.20–1.20)
Total Protein: 7.9 g/dL (ref 6.4–8.3)

## 2017-05-10 MED ORDER — HEPARIN SOD (PORK) LOCK FLUSH 100 UNIT/ML IV SOLN
500.0000 [IU] | Freq: Once | INTRAVENOUS | Status: AC | PRN
  Administered 2017-05-10: 500 [IU]
  Filled 2017-05-10: qty 5

## 2017-05-10 MED ORDER — TRASTUZUMAB CHEMO 150 MG IV SOLR
900.0000 mg | Freq: Once | INTRAVENOUS | Status: AC
  Administered 2017-05-10: 900 mg via INTRAVENOUS
  Filled 2017-05-10: qty 42.86

## 2017-05-10 MED ORDER — ANASTROZOLE 1 MG PO TABS: 1.0000 mg | ORAL_TABLET | Freq: Every day | ORAL | 3 refills | Status: DC

## 2017-05-10 MED ORDER — SODIUM CHLORIDE 0.9 % IV SOLN
Freq: Once | INTRAVENOUS | Status: AC
  Administered 2017-05-10: 10:00:00 via INTRAVENOUS

## 2017-05-10 MED ORDER — SODIUM CHLORIDE 0.9% FLUSH
10.0000 mL | INTRAVENOUS | Status: DC | PRN
  Administered 2017-05-10: 10 mL via INTRAVENOUS
  Filled 2017-05-10: qty 10

## 2017-05-10 MED ORDER — ACETAMINOPHEN 325 MG PO TABS
ORAL_TABLET | ORAL | Status: AC
  Filled 2017-05-10: qty 2

## 2017-05-10 MED ORDER — LIDOCAINE-PRILOCAINE 2.5-2.5 % EX CREA: 1.0000 "application " | TOPICAL_CREAM | CUTANEOUS | 2 refills | Status: DC | PRN

## 2017-05-10 MED ORDER — ACETAMINOPHEN 325 MG PO TABS
650.0000 mg | ORAL_TABLET | Freq: Once | ORAL | Status: AC
  Administered 2017-05-10: 650 mg via ORAL

## 2017-05-10 MED ORDER — DIPHENHYDRAMINE HCL 25 MG PO CAPS
ORAL_CAPSULE | ORAL | Status: AC
  Filled 2017-05-10: qty 1

## 2017-05-10 MED ORDER — SODIUM CHLORIDE 0.9% FLUSH
10.0000 mL | INTRAVENOUS | Status: DC | PRN
  Administered 2017-05-10: 10 mL
  Filled 2017-05-10: qty 10

## 2017-05-10 MED ORDER — DIPHENHYDRAMINE HCL 25 MG PO CAPS
25.0000 mg | ORAL_CAPSULE | Freq: Once | ORAL | Status: AC
  Administered 2017-05-10: 25 mg via ORAL

## 2017-05-10 NOTE — Assessment & Plan Note (Signed)
Left breast biopsy02/20/2018:8:00 retroareolar position: IDC, high-grade with DCIS high-grade, ER 90%, PR 95%, Ki-67 20%, HER-2 positive ratio 3.4; screening detected left breast distortion LIQ 8 8:00 retroareolar 0.8 cm, axilla negative, T1b N0 stage IA clinical stage Left lumpectomy 09/22/2016: IDC grade 3, 1.3 cm, DCIS high-grade, margins clear, 0/1 lymph node negative, T1 CN 0 stage IA, ER 90%, PR 95%, Ki-67 20%, HER-2 positive ratio 3.4  Recommendation: 1. Adjuvant chemotherapy and Herceptin with weekly Taxol and Herceptin 12 completed 01/04/2017 followed by Herceptin maintenance for 1 year 3. Adjuvant radiation 01/27/2017-03/16/2017 4. Followed by adjuvant antiestrogen therapy with letrozole 2.5 mg daily 5 years started 05/10/2017 ----------------------------------------------------------------------------- Current treatment: Herceptin maintenance Diabetes: Patient being more careful about her diet  Anastrozole counseling: We discussed the risks and benefits of anti-estrogen therapy with aromatase inhibitors. These include but not limited to insomnia, hot flashes, mood changes, vaginal dryness, bone density loss, and weight gain. We strongly believe that the benefits far outweigh the risks. Patient understands these risks and consented to starting treatment. Planned treatment duration is 5-7 years.  Return to clinic every 3 weeks for Herceptin every 6 weeks for follow-up with me

## 2017-05-10 NOTE — Telephone Encounter (Signed)
Gave patient avs and calendar with appt per 11/12 los.  °

## 2017-05-10 NOTE — Patient Instructions (Signed)
[facility] Discharge Instructions for Patients Receiving Chemotherapy  Today you received the following chemotherapy agents Herceptin  To help prevent nausea and vomiting after your treatment, we encourage you to take your nausea medication as directed   If you develop nausea and vomiting that is not controlled by your nausea medication, call the clinic.   BELOW ARE SYMPTOMS THAT SHOULD BE REPORTED IMMEDIATELY:  *FEVER GREATER THAN 100.5 F  *CHILLS WITH OR WITHOUT FEVER  NAUSEA AND VOMITING THAT IS NOT CONTROLLED WITH YOUR NAUSEA MEDICATION  *UNUSUAL SHORTNESS OF BREATH  *UNUSUAL BRUISING OR BLEEDING  TENDERNESS IN MOUTH AND THROAT WITH OR WITHOUT PRESENCE OF ULCERS  *URINARY PROBLEMS  *BOWEL PROBLEMS  UNUSUAL RASH Items with * indicate a potential emergency and should be followed up as soon as possible.  Feel free to call the clinic should you have any questions or concerns. The clinic phone number is [phone].  Please show the CHEMO ALERT CARD at check-in to the Emergency Department and triage nurse.   

## 2017-05-10 NOTE — Progress Notes (Signed)
Patient Care Team: Janith Lima, MD as PCP - General (Internal Medicine) Alphonsa Overall, MD as Consulting Physician (General Surgery) Nicholas Lose, MD as Consulting Physician (Hematology and Oncology) Eppie Gibson, MD as Attending Physician (Radiation Oncology)  DIAGNOSIS:  Encounter Diagnoses  Name Primary?  . Malignant neoplasm of lower-inner quadrant of left breast in female, estrogen receptor positive ([place])   . Post-menopausal Yes    SUMMARY OF ONCOLOGIC HISTORY:   Malignant neoplasm of lower-inner quadrant of left breast in female, estrogen receptor positive ([place])   08/18/2016 Initial Diagnosis    Left breast biopsy 8:00 retroareolar position: IDC, high-grade with DCIS high-grade, ER 90%, PR 95%, Ki-67 20%, HER-2 positive ratio 3.4; screening detected left breast distortion LIQ 8 8:00 retroareolar 0.8 cm, axilla negative, T1b N0 stage IA clinical stage      09/22/2016 Surgery    Left lumpectomy: IDC grade 3, 1.3 cm, DCIS high-grade, margins clear, 0/1 lymph node negative, T1CN 0 stage IA, ER 90%, PR 95%, Ki-67 20%, HER-2 positive ratio 3.4      10/19/2016 - 01/04/2017 Chemotherapy    Taxol Herceptin weekly 12 followed by Herceptin maintenance every 3 weeks for 1 year       01/27/2017 - 03/16/2017 Radiation Therapy    Adjuvant radiation therapy      05/10/2017 -  Anti-estrogen oral therapy    Anastrozole 1 mg p.o. daily       CHIEF COMPLIANT: Follow-up on Herceptin  INTERVAL HISTORY: Sandra Wood is a 61 year old with above-mentioned history of left breast cancer currently on Herceptin maintenance.  She finished adjuvant radiation therapy and is here today to discuss starting on antiestrogen therapy.  Her energy levels are improving.  She continues to have some fatigue.  She has healed very well from the prior effects of radiation.  REVIEW OF SYSTEMS:   Constitutional: Denies fevers, chills or abnormal weight loss, obesity Eyes: Denies blurriness of  vision Ears, nose, mouth, throat, and face: Denies mucositis or sore throat Respiratory: Denies cough, dyspnea or wheezes Cardiovascular: Denies palpitation, chest discomfort Gastrointestinal:  Denies nausea, heartburn or change in bowel habits Skin: Denies abnormal skin rashes Lymphatics: Denies new lymphadenopathy or easy bruising Neurological:Denies numbness, tingling or new weaknesses Behavioral/Psych: Mood is stable, no new changes  Extremities: No lower extremity edema Breast:  denies any pain or lumps or nodules in either breasts All other systems were reviewed with the patient and are negative.  I have reviewed the past medical history, past surgical history, social history and family history with the patient and they are unchanged from previous note.  ALLERGIES:  is allergic to no known allergies.  MEDICATIONS:  Current Outpatient Medications  Medication Sig Dispense Refill  . anastrozole (ARIMIDEX) 1 MG tablet Take 1 tablet (1 mg total) daily by mouth. 90 tablet 3  . blood glucose meter kit and supplies KIT Dispense based on patient and insurance preference. Use up to four times daily as directed. (FOR ICD-9 250.00, 250.01). Check blood sugar BID. 1 each 0  . glucose blood test strip Use as instructed 100 each 12  . ibuprofen (ADVIL,MOTRIN) 200 MG tablet Take 400-600 mg by mouth every 6 (six) hours as needed for headache or mild pain (depends on pain if takes 2-3 tablets).     Marland Kitchen ipratropium-albuterol (DUONEB) 0.5-2.5 (3) MG/3ML SOLN Inhale 3 mLs into the lungs every 4 (four) hours as needed (shortness of breath).     . lidocaine-prilocaine (EMLA) cream Apply 1 application as needed  topically. 30 g 2  . lisinopril (PRINIVIL,ZESTRIL) 10 MG tablet Take 1 tablet (10 mg total) by mouth daily. 90 tablet 3  . PROAIR HFA 108 (90 Base) MCG/ACT inhaler Inhale 2 puffs into the lungs every 6 (six) hours as needed for wheezing or shortness of breath.      No current facility-administered  medications for this visit.     PHYSICAL EXAMINATION: ECOG PERFORMANCE STATUS: 1 - Symptomatic but completely ambulatory  Vitals:   05/10/17 0909  BP: (!) 141/61  Pulse: 85  Resp: 20  Temp: 98.5 F (36.9 C)  SpO2: 93%   Filed Weights   05/10/17 0909  Weight: (!) 320 lb 8 oz (145.4 kg)    GENERAL:alert, no distress and comfortable SKIN: skin color, texture, turgor are normal, no rashes or significant lesions EYES: normal, Conjunctiva are pink and non-injected, sclera clear OROPHARYNX:no exudate, no erythema and lips, buccal mucosa, and tongue normal  NECK: supple, thyroid normal size, non-tender, without nodularity LYMPH:  no palpable lymphadenopathy in the cervical, axillary or inguinal LUNGS: clear to auscultation and percussion with normal breathing effort HEART: regular rate & rhythm and no murmurs and no lower extremity edema ABDOMEN:abdomen soft, non-tender and normal bowel sounds MUSCULOSKELETAL:no cyanosis of digits and no clubbing  NEURO: alert & oriented x 3 with fluent speech, no focal motor/sensory deficits EXTREMITIES: No lower extremity edema  LABORATORY DATA:  I have reviewed the data as listed   Chemistry      Component Value Date/Time   NA 137 05/10/2017 0815   K 4.3 05/10/2017 0815   CL 103 09/15/2016 1255   CO2 24 05/10/2017 0815   BUN 14.4 05/10/2017 0815   CREATININE 0.8 05/10/2017 0815      Component Value Date/Time   CALCIUM 9.5 05/10/2017 0815   ALKPHOS 128 05/10/2017 0815   AST 13 05/10/2017 0815   ALT 17 05/10/2017 0815   BILITOT 0.54 05/10/2017 0815       Lab Results  Component Value Date   WBC 10.6 (H) 05/10/2017   HGB 14.6 05/10/2017   HCT 45.0 05/10/2017   MCV 82.2 05/10/2017   PLT 326 05/10/2017   NEUTROABS 7.4 (H) 05/10/2017    ASSESSMENT & PLAN:  Malignant neoplasm of lower-inner quadrant of left breast in female, estrogen receptor positive ([facility]) Left breast biopsy02/20/2018:8:00 retroareolar position: IDC,  high-grade with DCIS high-grade, ER 90%, PR 95%, Ki-67 20%, HER-2 positive ratio 3.4; screening detected left breast distortion LIQ 8 8:00 retroareolar 0.8 cm, axilla negative, T1b N0 stage IA clinical stage Left lumpectomy 09/22/2016: IDC grade 3, 1.3 cm, DCIS high-grade, margins clear, 0/1 lymph node negative, T1 CN 0 stage IA, ER 90%, PR 95%, Ki-67 20%, HER-2 positive ratio 3.4  Recommendation: 1. Adjuvant chemotherapy and Herceptin with weekly Taxol and Herceptin 12 completed 01/04/2017 followed by Herceptin maintenance for 1 year 3. Adjuvant radiation 01/27/2017-03/16/2017 4. Followed by adjuvant antiestrogen therapy with letrozole 2.5 mg daily 5 years started 05/10/2017 ----------------------------------------------------------------------------- Current treatment: Herceptin maintenance until end of April 2019 Diabetes: Patient being more careful about her diet  Anastrozole counseling: We discussed the risks and benefits of anti-estrogen therapy with aromatase inhibitors. These include but not limited to insomnia, hot flashes, mood changes, vaginal dryness, bone density loss, and weight gain. We strongly believe that the benefits far outweigh the risks. Patient understands these risks and consented to starting treatment. Planned treatment duration is 5-7 years. I sent a prescription for anastrozole today.  Return to clinic every 3 weeks  for Herceptin.  I spent 25 minutes talking to the patient of which more than half was spent in counseling and coordination of care.  Orders Placed This Encounter  Procedures  . DG Bone Density    Standing Status:   Future    Standing Expiration Date:   06/14/2018    Order Specific Question:   Reason for exam:    Answer:   post menopausal    Order Specific Question:   Preferred imaging location?    Answer:   [facility]   The patient has a good understanding of the overall plan. she agrees with it. she will call with any problems that may  develop before the next visit here.   Rulon Eisenmenger, MD 05/10/17

## 2017-05-18 ENCOUNTER — Ambulatory Visit
Admission: RE | Admit: 2017-05-18 | Discharge: 2017-05-18 | Disposition: A | Discharge status: Home / Self Care | Payer: No Typology Code available for payment source | Source: Ambulatory Visit | Attending: Hematology and Oncology | Admitting: Hematology and Oncology

## 2017-05-18 DIAGNOSIS — Z780 Asymptomatic menopausal state: Secondary | ICD-10-CM

## 2017-05-31 ENCOUNTER — Ambulatory Visit (HOSPITAL_BASED_OUTPATIENT_CLINIC_OR_DEPARTMENT_OTHER): Payer: No Typology Code available for payment source

## 2017-05-31 ENCOUNTER — Telehealth: Payer: Self-pay

## 2017-05-31 ENCOUNTER — Other Ambulatory Visit: Payer: Self-pay

## 2017-05-31 VITALS — BP 135/80 | HR 91 | Temp 99.3°F | Resp 18 | Wt 321.6 lb

## 2017-05-31 DIAGNOSIS — C50312 Malignant neoplasm of lower-inner quadrant of left female breast: Secondary | ICD-10-CM | POA: Diagnosis not present

## 2017-05-31 DIAGNOSIS — Z5112 Encounter for antineoplastic immunotherapy: Secondary | ICD-10-CM | POA: Diagnosis not present

## 2017-05-31 DIAGNOSIS — Z170 Estrogen receptor positive status [ER+]: Principal | ICD-10-CM

## 2017-05-31 DIAGNOSIS — Z452 Encounter for adjustment and management of vascular access device: Secondary | ICD-10-CM

## 2017-05-31 MED ORDER — SODIUM CHLORIDE 0.9% FLUSH
10.0000 mL | INTRAVENOUS | Status: DC | PRN
  Administered 2017-05-31: 10 mL
  Filled 2017-05-31: qty 10

## 2017-05-31 MED ORDER — ALTEPLASE 2 MG IJ SOLR
2.0000 mg | Freq: Once | INTRAMUSCULAR | Status: AC | PRN
  Administered 2017-05-31: 2 mg
  Filled 2017-05-31: qty 2

## 2017-05-31 MED ORDER — SODIUM CHLORIDE 0.9 % IV SOLN
Freq: Once | INTRAVENOUS | Status: AC
  Administered 2017-05-31: 12:00:00 via INTRAVENOUS

## 2017-05-31 MED ORDER — HEPARIN SOD (PORK) LOCK FLUSH 100 UNIT/ML IV SOLN
500.0000 [IU] | Freq: Once | INTRAVENOUS | Status: AC | PRN
  Administered 2017-05-31: 500 [IU]
  Filled 2017-05-31: qty 5

## 2017-05-31 MED ORDER — TRASTUZUMAB CHEMO 150 MG IV SOLR
900.0000 mg | Freq: Once | INTRAVENOUS | Status: AC
  Administered 2017-05-31: 900 mg via INTRAVENOUS
  Filled 2017-05-31: qty 42.86

## 2017-05-31 MED ORDER — DIPHENHYDRAMINE HCL 25 MG PO CAPS
25.0000 mg | ORAL_CAPSULE | Freq: Once | ORAL | Status: AC
  Administered 2017-05-31: 25 mg via ORAL

## 2017-05-31 MED ORDER — AMOXICILLIN 500 MG PO TABS: 500.0000 mg | ORAL_TABLET | Freq: Two times a day (BID) | ORAL | 0 refills | Status: AC

## 2017-05-31 MED ORDER — DIPHENHYDRAMINE HCL 25 MG PO CAPS
ORAL_CAPSULE | ORAL | Status: AC
  Filled 2017-05-31: qty 1

## 2017-05-31 MED ORDER — ACETAMINOPHEN 325 MG PO TABS
650.0000 mg | ORAL_TABLET | Freq: Once | ORAL | Status: AC
  Administered 2017-05-31: 650 mg via ORAL

## 2017-05-31 NOTE — Telephone Encounter (Signed)
Pt is in infusion today and is complaining of congested cough, discolored sputum, and low grade fever of 99.4. Informed pt called in a prescription for antibiotic. No further needs at this time.  Cyndia Bent RN

## 2017-05-31 NOTE — Progress Notes (Signed)
P/C to Tiffany,RN, Dr Geralyn Flash nurse to make aware of pt's low grade temp of cough with productive yellow/green sputum  And nasal congestion reportedly lasting 6 days. Antibiotic being faxed to [facility]. Pt informed and verbalized understanding. Cathflow administered at 0955 after no blood return. 1025 no blood return. Pt instructed to cough, turn and hug herself with no results. No edema noted or discomfort reported. 1210 No blood return noted. Pt states at this time she has some "stinging" and asks if the port can be re-accessed. Port accessed with blood return noted and no c/o of discomfort.

## 2017-05-31 NOTE — Patient Instructions (Addendum)
[facility] Discharge Instructions for Patients Receiving Chemotherapy               Pick [facility] at [facility]  Today you received the following chemotherapy agents Herceptin  To help prevent nausea and vomiting after your treatment, we encourage you to take your nausea medication as directed   If you develop nausea and vomiting that is not controlled by your nausea medication, call the clinic.   BELOW ARE SYMPTOMS THAT SHOULD BE REPORTED IMMEDIATELY:  *FEVER GREATER THAN 100.5 F  *CHILLS WITH OR WITHOUT FEVER  NAUSEA AND VOMITING THAT IS NOT CONTROLLED WITH YOUR NAUSEA MEDICATION  *UNUSUAL SHORTNESS OF BREATH  *UNUSUAL BRUISING OR BLEEDING  TENDERNESS IN MOUTH AND THROAT WITH OR WITHOUT PRESENCE OF ULCERS  *URINARY PROBLEMS  *BOWEL PROBLEMS  UNUSUAL RASH Items with * indicate a potential emergency and should be followed up as soon as possible.  Feel free to call the clinic should you have any questions or concerns. The clinic phone number is [phone].  Please show the [facility] at check-in to the Emergency Department and triage nurse.

## 2017-06-10 ENCOUNTER — Encounter (HOSPITAL_COMMUNITY): Payer: Self-pay | Admitting: Cardiology

## 2017-06-10 ENCOUNTER — Ambulatory Visit (HOSPITAL_BASED_OUTPATIENT_CLINIC_OR_DEPARTMENT_OTHER)
Admission: RE | Admit: 2017-06-10 | Discharge: 2017-06-10 | Disposition: A | Discharge status: Home / Self Care | Payer: No Typology Code available for payment source | Source: Ambulatory Visit | Attending: Cardiology | Admitting: Cardiology

## 2017-06-10 ENCOUNTER — Ambulatory Visit (HOSPITAL_COMMUNITY)
Admission: RE | Admit: 2017-06-10 | Discharge: 2017-06-10 | Disposition: A | Discharge status: Home / Self Care | Payer: No Typology Code available for payment source | Source: Ambulatory Visit | Attending: Cardiology | Admitting: Cardiology

## 2017-06-10 VITALS — BP 169/82 | HR 87 | Wt 319.8 lb

## 2017-06-10 DIAGNOSIS — E119 Type 2 diabetes mellitus without complications: Secondary | ICD-10-CM | POA: Insufficient documentation

## 2017-06-10 DIAGNOSIS — Z791 Long term (current) use of non-steroidal anti-inflammatories (NSAID): Secondary | ICD-10-CM | POA: Insufficient documentation

## 2017-06-10 DIAGNOSIS — I10 Essential (primary) hypertension: Secondary | ICD-10-CM

## 2017-06-10 DIAGNOSIS — K219 Gastro-esophageal reflux disease without esophagitis: Secondary | ICD-10-CM | POA: Diagnosis not present

## 2017-06-10 DIAGNOSIS — Z853 Personal history of malignant neoplasm of breast: Secondary | ICD-10-CM | POA: Diagnosis not present

## 2017-06-10 DIAGNOSIS — Z170 Estrogen receptor positive status [ER+]: Secondary | ICD-10-CM

## 2017-06-10 DIAGNOSIS — Z803 Family history of malignant neoplasm of breast: Secondary | ICD-10-CM | POA: Insufficient documentation

## 2017-06-10 DIAGNOSIS — C50312 Malignant neoplasm of lower-inner quadrant of left female breast: Secondary | ICD-10-CM

## 2017-06-10 DIAGNOSIS — Z8249 Family history of ischemic heart disease and other diseases of the circulatory system: Secondary | ICD-10-CM | POA: Insufficient documentation

## 2017-06-10 DIAGNOSIS — Z8042 Family history of malignant neoplasm of prostate: Secondary | ICD-10-CM | POA: Insufficient documentation

## 2017-06-10 DIAGNOSIS — Z87891 Personal history of nicotine dependence: Secondary | ICD-10-CM | POA: Insufficient documentation

## 2017-06-10 DIAGNOSIS — Z79899 Other long term (current) drug therapy: Secondary | ICD-10-CM | POA: Insufficient documentation

## 2017-06-10 DIAGNOSIS — J45909 Unspecified asthma, uncomplicated: Secondary | ICD-10-CM | POA: Diagnosis not present

## 2017-06-10 DIAGNOSIS — Z833 Family history of diabetes mellitus: Secondary | ICD-10-CM | POA: Insufficient documentation

## 2017-06-10 LAB — ECHOCARDIOGRAM COMPLETE
E decel time: 229 msec
EERAT: 6.49
FS: 23 % — AB (ref 28–44)
LA diam end sys: 35 mm
LA diam index: 1.5 cm/m2
LA vol: 50.1 mL
LASIZE: 35 mm
LAVOLA4C: 46 mL
LAVOLIN: 21.4 mL/m2
LV E/e'average: 6.49
LV TDI E'MEDIAL: 6.64
LV e' LATERAL: 9.57 cm/s
LVEEMED: 6.49
LVOT VTI: 18.7 cm
LVOT area: 3.8 cm2
LVOT peak grad rest: 4 mmHg
LVOTD: 22 mm
LVOTPV: 102 cm/s
LVOTSV: 71 mL
MV Dec: 229
MV pk A vel: 86.5 m/s
MV pk E vel: 62.1 m/s
RV LATERAL S' VELOCITY: 10.8 cm/s
RV TAPSE: 20.9 mm
TDI e' lateral: 9.57

## 2017-06-10 MED ORDER — PERFLUTREN LIPID MICROSPHERE
1.0000 mL | INTRAVENOUS | Status: AC | PRN
  Administered 2017-06-10: 2 mL via INTRAVENOUS

## 2017-06-10 MED ORDER — LOSARTAN POTASSIUM 50 MG PO TABS: 50.0000 mg | ORAL_TABLET | Freq: Every day | ORAL | 3 refills | Status: DC

## 2017-06-10 MED ORDER — HEPARIN SOD (PORK) LOCK FLUSH 100 UNIT/ML IV SOLN
500.0000 [IU] | INTRAVENOUS | Status: AC | PRN
  Administered 2017-06-10: 500 [IU]

## 2017-06-10 NOTE — Progress Notes (Signed)
Oncology: Dr. Lindi Adie  61 yo with history of DM2 and asthma has developed breast cancer.  Breast cancer is on left, ER+/PR+/HER2+.  She had left lumpectomy in 3/18 followed by Taxol + Herceptin to extend for 12 cycles starting 4/18.  She will continue Herceptin to complete a full year.  She has completed XRT.   She returns for cardio-oncology followup.  She has been doing well, no dyspnea or chest pain.  She took lisinopril but developed a hacking dry cough so stopped it.  BP is high today.   Labs (9/18): K 4, creatinine 0.7 Labs (11/18): creatinine 0.8  PMH: 1. Asthma: Mild persistent.  2. Type II diabetes. 3. GERD 4. Breast cancer: On left.  ER+/PR+/HER2+.  She had left lumpectomy in 4/18 followed by Taxol + Herceptin x 12 cycles then Herceptin to complete a full year.  - Echo (3/18): EF 60-65%, normal RV size and systolic function, strain imaging not done.  - Echo (6/18): EF 73-41%, normal diastolic function, GLS -93.7% but images were technically difficult.  - Echo (9/18): EF 55-60%, GLS -17.2% (strain images difficult), normal RV size and systolic function, mild LVH.  - Echo (12/18): EF 60-65%, strain images not done (difficult study).  5. HTN: Cough with lisinopril.   Social History   Socioeconomic History  . Marital status: Divorced    Spouse name: Not on file  . Number of children: Not on file  . Years of education: Not on file  . Highest education level: Not on file  Social Needs  . Financial resource strain: Not on file  . Food insecurity - worry: Not on file  . Food insecurity - inability: Not on file  . Transportation needs - medical: Not on file  . Transportation needs - non-medical: Not on file  Occupational History  . Not on file  Tobacco Use  . Smoking status: Former Smoker    Years: 5.00    Types: Cigarettes    Last attempt to quit: 08/13/1986    Years since quitting: 30.8  . Smokeless tobacco: Never Used  Substance and Sexual Activity  . Alcohol use: Yes    Comment: OCC.  . Drug use: No  . Sexual activity: Not Currently    Birth control/protection: Post-menopausal, Abstinence  Other Topics Concern  . Not on file  Social History Narrative  . Not on file    Family History  Problem Relation Age of Onset  . Diabetes Mother   . Heart disease Father   . Cancer Brother 26       prostate  . Breast cancer Maternal Grandmother 88   ROS: All systems reviewed and negative except as per HPI.   Current Outpatient Medications  Medication Sig Dispense Refill  . anastrozole (ARIMIDEX) 1 MG tablet Take 1 tablet (1 mg total) daily by mouth. 90 tablet 3  . blood glucose meter kit and supplies KIT Dispense based on patient and insurance preference. Use up to four times daily as directed. (FOR ICD-9 250.00, 250.01). Check blood sugar BID. 1 each 0  . glucose blood test strip Use as instructed 100 each 12  . ibuprofen (ADVIL,MOTRIN) 200 MG tablet Take 400-600 mg by mouth every 6 (six) hours as needed for headache or mild pain (depends on pain if takes 2-3 tablets).     Marland Kitchen ipratropium-albuterol (DUONEB) 0.5-2.5 (3) MG/3ML SOLN Inhale 3 mLs into the lungs every 4 (four) hours as needed (shortness of breath).     . lidocaine-prilocaine (EMLA) cream  Apply 1 application as needed topically. 30 g 2  . PROAIR HFA 108 (90 Base) MCG/ACT inhaler Inhale 2 puffs into the lungs every 6 (six) hours as needed for wheezing or shortness of breath.     . losartan (COZAAR) 50 MG tablet Take 1 tablet (50 mg total) by mouth daily. 90 tablet 3   No current facility-administered medications for this encounter.    BP (!) 169/82   Pulse 87   Wt (!) 319 lb 12.8 oz (145.1 kg)   LMP 06/30/2003   SpO2 94%   BMI 58.49 kg/m  General: NAD Neck: No JVD, no thyromegaly or thyroid nodule.  Lungs: Clear to auscultation bilaterally with normal respiratory effort. CV: Nondisplaced PMI.  Heart regular S1/S2, no S3/S4, no murmur.  No peripheral edema.  No carotid bruit.  Normal pedal  pulses.  Abdomen: Soft, nontender, no hepatosplenomegaly, no distention.  Skin: Intact without lesions or rashes.  Neurologic: Alert and oriented x 3.  Psych: Normal affect. Extremities: No clubbing or cyanosis.  HEENT: Normal.   Assessment/Plan: 1. Breast cancer: She started Herceptin-based therapy in 4/18, this will extend for a year.  I reviewed today's echo.  EF remains normal but strain could not be done (technically difficult study).  - Continue Herceptin, repeat echo in 3 months with office visit.  2. HTN: BP remains high, had cough with lisinopril so stopped it.  - Start losartan 50 mg daily with BMET in 2 wks.    Loralie Champagne 06/10/2017

## 2017-06-10 NOTE — Progress Notes (Signed)
  Echocardiogram 2D Echocardiogram has been performed.  Johny Chess 06/10/2017, 10:47 AM

## 2017-06-10 NOTE — Patient Instructions (Signed)
STOP Lisinopril.  Start Losartan.  Follow up and echo with Dr.McLean in 3 months

## 2017-06-21 ENCOUNTER — Ambulatory Visit (HOSPITAL_BASED_OUTPATIENT_CLINIC_OR_DEPARTMENT_OTHER): Payer: No Typology Code available for payment source

## 2017-06-21 VITALS — BP 142/49 | HR 81 | Temp 98.8°F | Resp 18

## 2017-06-21 DIAGNOSIS — Z5112 Encounter for antineoplastic immunotherapy: Secondary | ICD-10-CM

## 2017-06-21 DIAGNOSIS — C50312 Malignant neoplasm of lower-inner quadrant of left female breast: Secondary | ICD-10-CM

## 2017-06-21 DIAGNOSIS — Z170 Estrogen receptor positive status [ER+]: Principal | ICD-10-CM

## 2017-06-21 DIAGNOSIS — Z452 Encounter for adjustment and management of vascular access device: Secondary | ICD-10-CM | POA: Diagnosis not present

## 2017-06-21 MED ORDER — HEPARIN SOD (PORK) LOCK FLUSH 100 UNIT/ML IV SOLN
500.0000 [IU] | Freq: Once | INTRAVENOUS | Status: AC | PRN
  Administered 2017-06-21: 500 [IU]
  Filled 2017-06-21: qty 5

## 2017-06-21 MED ORDER — SODIUM CHLORIDE 0.9% FLUSH
10.0000 mL | INTRAVENOUS | Status: DC | PRN
  Administered 2017-06-21: 10 mL
  Filled 2017-06-21: qty 10

## 2017-06-21 MED ORDER — ALTEPLASE 2 MG IJ SOLR
INTRAMUSCULAR | Status: AC
  Filled 2017-06-21: qty 2

## 2017-06-21 MED ORDER — ALTEPLASE 2 MG IJ SOLR
2.0000 mg | Freq: Once | INTRAMUSCULAR | Status: AC | PRN
  Administered 2017-06-21: 2 mg
  Filled 2017-06-21: qty 2

## 2017-06-21 MED ORDER — ACETAMINOPHEN 325 MG PO TABS
ORAL_TABLET | ORAL | Status: AC
  Filled 2017-06-21: qty 2

## 2017-06-21 MED ORDER — DIPHENHYDRAMINE HCL 25 MG PO CAPS
ORAL_CAPSULE | ORAL | Status: AC
  Filled 2017-06-21: qty 1

## 2017-06-21 MED ORDER — SODIUM CHLORIDE 0.9 % IV SOLN
Freq: Once | INTRAVENOUS | Status: AC
  Administered 2017-06-21: 10:00:00 via INTRAVENOUS

## 2017-06-21 MED ORDER — TRASTUZUMAB CHEMO 150 MG IV SOLR
900.0000 mg | Freq: Once | INTRAVENOUS | Status: AC
  Administered 2017-06-21: 900 mg via INTRAVENOUS
  Filled 2017-06-21: qty 42.86

## 2017-06-21 MED ORDER — DIPHENHYDRAMINE HCL 25 MG PO CAPS
25.0000 mg | ORAL_CAPSULE | Freq: Once | ORAL | Status: AC
  Administered 2017-06-21: 25 mg via ORAL

## 2017-06-21 MED ORDER — ACETAMINOPHEN 325 MG PO TABS
650.0000 mg | ORAL_TABLET | Freq: Once | ORAL | Status: AC
  Administered 2017-06-21: 650 mg via ORAL

## 2017-06-21 NOTE — Patient Instructions (Signed)
[facility] Discharge Instructions for Patients Receiving Chemotherapy               [facility]  Today you received the following chemotherapy agents: Herceptin.  To help prevent nausea and vomiting after your treatment, we encourage you to take your nausea medication as directed   If you develop nausea and vomiting that is not controlled by your nausea medication, call the clinic.   BELOW ARE SYMPTOMS THAT SHOULD BE REPORTED IMMEDIATELY:  *FEVER GREATER THAN 100.5 F  *CHILLS WITH OR WITHOUT FEVER  NAUSEA AND VOMITING THAT IS NOT CONTROLLED WITH YOUR NAUSEA MEDICATION  *UNUSUAL SHORTNESS OF BREATH  *UNUSUAL BRUISING OR BLEEDING  TENDERNESS IN MOUTH AND THROAT WITH OR WITHOUT PRESENCE OF ULCERS  *URINARY PROBLEMS  *BOWEL PROBLEMS  UNUSUAL RASH Items with * indicate a potential emergency and should be followed up as soon as possible.  Feel free to call the clinic should you have any questions or concerns. The clinic phone number is [phone].  Please show the [place] at check-in to the Emergency Department and triage nurse.

## 2017-07-08 ENCOUNTER — Telehealth: Payer: Self-pay | Admitting: Hematology and Oncology

## 2017-07-08 NOTE — Telephone Encounter (Signed)
Pt called in and requested appt be rescheduled to either today or tomorrow due to possible weather issues . - Called patient back and left a message unable to get r/s to today or tomorrow due to availability in the treatment area.

## 2017-07-09 ENCOUNTER — Other Ambulatory Visit: Payer: Self-pay

## 2017-07-12 ENCOUNTER — Inpatient Hospital Stay: Payer: No Typology Code available for payment source

## 2017-07-12 ENCOUNTER — Other Ambulatory Visit: Payer: Self-pay

## 2017-07-12 ENCOUNTER — Encounter: Payer: Self-pay | Admitting: *Deleted

## 2017-07-12 ENCOUNTER — Inpatient Hospital Stay
Payer: No Typology Code available for payment source | Attending: Hematology and Oncology | Admitting: Hematology and Oncology

## 2017-07-12 DIAGNOSIS — D0512 Intraductal carcinoma in situ of left breast: Secondary | ICD-10-CM

## 2017-07-12 DIAGNOSIS — Z79899 Other long term (current) drug therapy: Secondary | ICD-10-CM | POA: Diagnosis not present

## 2017-07-12 DIAGNOSIS — Z923 Personal history of irradiation: Secondary | ICD-10-CM

## 2017-07-12 DIAGNOSIS — C50312 Malignant neoplasm of lower-inner quadrant of left female breast: Secondary | ICD-10-CM

## 2017-07-12 DIAGNOSIS — Z170 Estrogen receptor positive status [ER+]: Secondary | ICD-10-CM

## 2017-07-12 DIAGNOSIS — Z9221 Personal history of antineoplastic chemotherapy: Secondary | ICD-10-CM

## 2017-07-12 DIAGNOSIS — Z5112 Encounter for antineoplastic immunotherapy: Secondary | ICD-10-CM | POA: Diagnosis not present

## 2017-07-12 DIAGNOSIS — Z95828 Presence of other vascular implants and grafts: Secondary | ICD-10-CM

## 2017-07-12 LAB — CMP (CANCER CENTER ONLY)
ALT: 16 U/L (ref 0–55)
AST: 12 U/L (ref 5–34)
Albumin: 3.3 g/dL — ABNORMAL LOW (ref 3.5–5.0)
Alkaline Phosphatase: 136 U/L (ref 40–150)
Anion gap: 11 (ref 3–11)
BUN: 11 mg/dL (ref 7–26)
CHLORIDE: 103 mmol/L (ref 98–109)
CO2: 25 mmol/L (ref 22–29)
CREATININE: 0.77 mg/dL (ref 0.60–1.10)
Calcium: 9.5 mg/dL (ref 8.4–10.4)
GFR, Estimated: >60 mL/min (ref >60)
Glucose, Bld: 159 mg/dL — ABNORMAL HIGH (ref 70–140)
Potassium: 3.9 mmol/L (ref 3.3–4.7)
Sodium: 139 mmol/L (ref 136–145)
TOTAL PROTEIN: 7.5 g/dL (ref 6.4–8.3)
Total Bilirubin: 0.7 mg/dL (ref 0.2–1.2)

## 2017-07-12 LAB — CBC WITH DIFFERENTIAL (CANCER CENTER ONLY)
BASOS ABS: 0.1 10*3/uL (ref 0.0–0.1)
BASOS PCT: 1 %
EOS ABS: 0.4 10*3/uL (ref 0.0–0.5)
Eosinophils Relative: 4 %
HCT: 41.9 % (ref 34.8–46.6)
HEMOGLOBIN: 13.6 g/dL (ref 11.6–15.9)
LYMPHS ABS: 2.3 10*3/uL (ref 0.9–3.3)
Lymphocytes Relative: 22 %
MCH: 26.4 pg (ref 25.1–34.0)
MCHC: 32.5 g/dL (ref 31.5–36.0)
MCV: 81.3 fL (ref 79.5–101.0)
Monocytes Absolute: 0.9 10*3/uL (ref 0.1–0.9)
Monocytes Relative: 8 %
NEUTROS PCT: 65 %
Neutro Abs: 6.7 10*3/uL — ABNORMAL HIGH (ref 1.5–6.5)
Platelet Count: 322 10*3/uL (ref 145–400)
RBC: 5.15 MIL/uL (ref 3.70–5.45)
RDW: 15.9 % (ref 11.2–16.1)
WBC: 10.4 10*3/uL — AB (ref 3.9–10.3)

## 2017-07-12 MED ORDER — SODIUM CHLORIDE 0.9% FLUSH
10.0000 mL | INTRAVENOUS | Status: DC | PRN
  Filled 2017-07-12: qty 10

## 2017-07-12 MED ORDER — ACETAMINOPHEN 325 MG PO TABS
650.0000 mg | ORAL_TABLET | Freq: Once | ORAL | Status: AC
  Administered 2017-07-12: 650 mg via ORAL

## 2017-07-12 MED ORDER — HEPARIN SOD (PORK) LOCK FLUSH 100 UNIT/ML IV SOLN
500.0000 [IU] | Freq: Once | INTRAVENOUS | Status: DC | PRN
  Filled 2017-07-12: qty 5

## 2017-07-12 MED ORDER — DIPHENHYDRAMINE HCL 25 MG PO CAPS
ORAL_CAPSULE | ORAL | Status: AC
  Filled 2017-07-12: qty 1

## 2017-07-12 MED ORDER — ACETAMINOPHEN 325 MG PO TABS
ORAL_TABLET | ORAL | Status: AC
  Filled 2017-07-12: qty 2

## 2017-07-12 MED ORDER — SODIUM CHLORIDE 0.9 % IV SOLN
Freq: Once | INTRAVENOUS | Status: AC
  Administered 2017-07-12: 11:00:00 via INTRAVENOUS

## 2017-07-12 MED ORDER — DIPHENHYDRAMINE HCL 25 MG PO CAPS
25.0000 mg | ORAL_CAPSULE | Freq: Once | ORAL | Status: AC
  Administered 2017-07-12: 25 mg via ORAL

## 2017-07-12 MED ORDER — SODIUM CHLORIDE 0.9% FLUSH
10.0000 mL | INTRAVENOUS | Status: DC | PRN
  Administered 2017-07-12: 10 mL via INTRAVENOUS
  Filled 2017-07-12: qty 10

## 2017-07-12 MED ORDER — TRASTUZUMAB CHEMO 150 MG IV SOLR
900.0000 mg | Freq: Once | INTRAVENOUS | Status: AC
  Administered 2017-07-12: 900 mg via INTRAVENOUS
  Filled 2017-07-12: qty 42.86

## 2017-07-12 NOTE — Patient Instructions (Signed)
[facility] Discharge Instructions for Patients Receiving Chemotherapy               [facility]  Today you received the following chemotherapy agents: Herceptin.  To help prevent nausea and vomiting after your treatment, we encourage you to take your nausea medication as directed   If you develop nausea and vomiting that is not controlled by your nausea medication, call the clinic.   BELOW ARE SYMPTOMS THAT SHOULD BE REPORTED IMMEDIATELY:  *FEVER GREATER THAN 100.5 F  *CHILLS WITH OR WITHOUT FEVER  NAUSEA AND VOMITING THAT IS NOT CONTROLLED WITH YOUR NAUSEA MEDICATION  *UNUSUAL SHORTNESS OF BREATH  *UNUSUAL BRUISING OR BLEEDING  TENDERNESS IN MOUTH AND THROAT WITH OR WITHOUT PRESENCE OF ULCERS  *URINARY PROBLEMS  *BOWEL PROBLEMS  UNUSUAL RASH Items with * indicate a potential emergency and should be followed up as soon as possible.  Feel free to call the clinic should you have any questions or concerns. The clinic phone number is [phone].  Please show the [facility] at check-in to the Emergency Department and triage nurse.

## 2017-07-12 NOTE — Progress Notes (Signed)
Patient Care Team: Janith Lima, MD as PCP - General (Internal Medicine) Alphonsa Overall, MD as Consulting Physician (General Surgery) Nicholas Lose, MD as Consulting Physician (Hematology and Oncology) Eppie Gibson, MD as Attending Physician (Radiation Oncology)  DIAGNOSIS:  Encounter Diagnosis  Name Primary?  . Malignant neoplasm of lower-inner quadrant of left breast in female, estrogen receptor positive ([place])     1 SUMMARY OF ONCOLOGIC HISTORY:   Malignant neoplasm of lower-inner quadrant of left breast in female, estrogen receptor positive ([place])   08/18/2016 Initial Diagnosis    Left breast biopsy 8:00 retroareolar position: IDC, high-grade with DCIS high-grade, ER 90%, PR 95%, Ki-67 20%, HER-2 positive ratio 3.4; screening detected left breast distortion LIQ 8 8:00 retroareolar 0.8 cm, axilla negative, T1b N0 stage IA clinical stage      09/22/2016 Surgery    Left lumpectomy: IDC grade 3, 1.3 cm, DCIS high-grade, margins clear, 0/1 lymph node negative, T1CN 0 stage IA, ER 90%, PR 95%, Ki-67 20%, HER-2 positive ratio 3.4      10/19/2016 - 01/04/2017 Chemotherapy    Taxol Herceptin weekly 12 followed by Herceptin maintenance every 3 weeks for 1 year       01/27/2017 - 03/16/2017 Radiation Therapy    Adjuvant radiation therapy      05/10/2017 -  Anti-estrogen oral therapy    Anastrozole 1 mg p.o. daily       CHIEF COMPLIANT: Follow-up on Herceptin and anastrozole  INTERVAL HISTORY: Sandra Wood is a 62 year old with above-mentioned left breast cancer treated with lumpectomy followed by adjuvant chemotherapy and she is currently on Herceptin maintenance therapy.  She has had no problems on Herceptin.  Her biggest issues have been lately related to nonfunctioning ports.  Today her port is functioning quite well.  She is also started on anastrozole and appears to be tolerating it fairly well except for occasional hot flashes.  REVIEW OF SYSTEMS:   Constitutional:  Denies fevers, chills or abnormal weight loss Eyes: Denies blurriness of vision Ears, nose, mouth, throat, and face: Denies mucositis or sore throat Respiratory: Denies cough, dyspnea or wheezes Cardiovascular: Denies palpitation, chest discomfort Gastrointestinal:  Denies nausea, heartburn or change in bowel habits Skin: Denies abnormal skin rashes Lymphatics: Denies new lymphadenopathy or easy bruising Neurological:Denies numbness, tingling or new weaknesses Behavioral/Psych: Mood is stable, no new changes  Extremities: No lower extremity edema Breast:  denies any pain or lumps or nodules in either breasts All other systems were reviewed with the patient and are negative.  I have reviewed the past medical history, past surgical history, social history and family history with the patient and they are unchanged from previous note.  ALLERGIES:  is allergic to no known allergies.  MEDICATIONS:  Current Outpatient Medications  Medication Sig Dispense Refill  . anastrozole (ARIMIDEX) 1 MG tablet Take 1 tablet (1 mg total) daily by mouth. 90 tablet 3  . blood glucose meter kit and supplies KIT Dispense based on patient and insurance preference. Use up to four times daily as directed. (FOR ICD-9 250.00, 250.01). Check blood sugar BID. 1 each 0  . glucose blood test strip Use as instructed 100 each 12  . ibuprofen (ADVIL,MOTRIN) 200 MG tablet Take 400-600 mg by mouth every 6 (six) hours as needed for headache or mild pain (depends on pain if takes 2-3 tablets).     Marland Kitchen ipratropium-albuterol (DUONEB) 0.5-2.5 (3) MG/3ML SOLN Inhale 3 mLs into the lungs every 4 (four) hours as needed (shortness of breath).     Marland Kitchen  lidocaine-prilocaine (EMLA) cream Apply 1 application as needed topically. 30 g 2  . losartan (COZAAR) 50 MG tablet Take 1 tablet (50 mg total) by mouth daily. 90 tablet 3  . PROAIR HFA 108 (90 Base) MCG/ACT inhaler Inhale 2 puffs into the lungs every 6 (six) hours as needed for wheezing or  shortness of breath.      No current facility-administered medications for this visit.    Facility-Administered Medications Ordered in Other Visits  Medication Dose Route Frequency Provider Last Rate Last Dose  . heparin lock flush 100 unit/mL  500 Units Intracatheter Once PRN Nicholas Lose, MD      . sodium chloride flush (NS) 0.9 % injection 10 mL  10 mL Intracatheter PRN Nicholas Lose, MD        PHYSICAL EXAMINATION: ECOG PERFORMANCE STATUS: 1 - Symptomatic but completely ambulatory  Vitals:   07/12/17 0951  BP: (!) 146/69  Pulse: 87  Resp: 16  Temp: 98.7 F (37.1 C)  SpO2: 94%   Filed Weights   07/12/17 0951  Weight: (!) 320 lb 4.8 oz (145.3 kg)    GENERAL:alert, no distress and comfortable SKIN: skin color, texture, turgor are normal, no rashes or significant lesions EYES: normal, Conjunctiva are pink and non-injected, sclera clear OROPHARYNX:no exudate, no erythema and lips, buccal mucosa, and tongue normal  NECK: supple, thyroid normal size, non-tender, without nodularity LYMPH:  no palpable lymphadenopathy in the cervical, axillary or inguinal LUNGS: clear to auscultation and percussion with normal breathing effort HEART: regular rate & rhythm and no murmurs and no lower extremity edema ABDOMEN:abdomen soft, non-tender and normal bowel sounds MUSCULOSKELETAL:no cyanosis of digits and no clubbing  NEURO: alert & oriented x 3 with fluent speech, no focal motor/sensory deficits EXTREMITIES: No lower extremity edema  LABORATORY DATA:  I have reviewed the data as listed CMP Latest Ref Rng & Units 07/12/2017 05/10/2017 04/19/2017  Glucose 70 - 140 mg/dL 159(H) 141(H) 143(H)  BUN 7 - 26 mg/dL 11 14.4 11.5  Creatinine 0.6 - 1.1 mg/dL - 0.8 0.7  Sodium 136 - 145 mmol/L 139 137 140  Potassium 3.3 - 4.7 mmol/L 3.9 4.3 3.7  Chloride 98 - 109 mmol/L 103 - -  CO2 22 - 29 mmol/L _0 Calcium 8.4 - 10.4 mg/dL 9.5 9.5 9.2  Total Protein 6.4 - 8.3 g/dL 7.5 7.9 7.6  Total  Bilirubin 0.2 - 1.2 mg/dL 0.7 0.54 0.51  Alkaline Phos 40 - 150 U/L 136 128 116  AST 5 - 34 U/L _1 ALT 0 - 55 U/L _2 Lab Results  Component Value Date   WBC 10.6 (H) 05/10/2017   HGB 14.6 05/10/2017   HCT 41.9 07/12/2017   MCV 81.3 07/12/2017   PLT 326 05/10/2017   NEUTROABS 6.7 (H) 07/12/2017    ASSESSMENT & PLAN:  Malignant neoplasm of lower-inner quadrant of left breast in female, estrogen receptor positive ([facility]) Left breast biopsy02/20/2018:8:00 retroareolar position: IDC, high-grade with DCIS high-grade, ER 90%, PR 95%, Ki-67 20%, HER-2 positive ratio 3.4; screening detected left breast distortion LIQ 8 8:00 retroareolar 0.8 cm, axilla negative, T1b N0 stage IA clinical stage Left lumpectomy 09/22/2016: IDC grade 3, 1.3 cm, DCIS high-grade, margins clear, 0/1 lymph node negative, T1 CN 0 stage IA, ER 90%, PR 95%, Ki-67 20%, HER-2 positive ratio 3.4  Recommendation: 1. Adjuvant chemotherapy and Herceptin with weekly Taxol and Herceptin 12 completed 01/04/2017 followed by Herceptin maintenance for 1 year  3. Adjuvant radiation 01/27/2017-03/16/2017 4. Followed by adjuvant antiestrogen therapy with letrozole 2.5 mg daily 5 years started 05/10/2017 ----------------------------------------------------------------------------- Current treatment: Herceptin maintenance until end of April 2019 Diabetes: Patient being more careful about her diet  Anastrozole toxicities: Intermittent hot flashes Denies any arthralgias or myalgias  Return to clinic every 3 weeks for Herceptin and every 6 weeks for follow-up with me  I spent 25 minutes talking to the patient of which more than half was spent in counseling and coordination of care.  No orders of the defined types were placed in this encounter.  The patient has a good understanding of the overall plan. she agrees with it. she will call with any problems that may develop before the next visit here.   Harriette Ohara,  MD 07/12/17

## 2017-07-12 NOTE — Assessment & Plan Note (Signed)
Left breast biopsy02/20/2018:8:00 retroareolar position: IDC, high-grade with DCIS high-grade, ER 90%, PR 95%, Ki-67 20%, HER-2 positive ratio 3.4; screening detected left breast distortion LIQ 8 8:00 retroareolar 0.8 cm, axilla negative, T1b N0 stage IA clinical stage Left lumpectomy 09/22/2016: IDC grade 3, 1.3 cm, DCIS high-grade, margins clear, 0/1 lymph node negative, T1 CN 0 stage IA, ER 90%, PR 95%, Ki-67 20%, HER-2 positive ratio 3.4  Recommendation: 1. Adjuvant chemotherapy and Herceptin with weekly Taxol and Herceptin 12 completed 01/04/2017 followed by Herceptin maintenance for 1 year 3. Adjuvant radiation 01/27/2017-03/16/2017 4. Followed by adjuvant antiestrogen therapy with letrozole 2.5 mg daily 5 years started 05/10/2017 ----------------------------------------------------------------------------- Current treatment: Herceptin maintenance until end of April 2019 Diabetes: Patient being more careful about her diet  Anastrozole toxicities:  Return to clinic every 3 weeks for Herceptin and every 6 weeks for follow-up with me

## 2017-07-23 ENCOUNTER — Other Ambulatory Visit: Payer: Self-pay | Admitting: Surgery

## 2017-07-23 DIAGNOSIS — N63 Unspecified lump in unspecified breast: Secondary | ICD-10-CM

## 2017-08-02 ENCOUNTER — Inpatient Hospital Stay: Payer: No Typology Code available for payment source | Attending: Hematology and Oncology

## 2017-08-02 VITALS — BP 130/64 | HR 83 | Temp 98.7°F | Resp 18

## 2017-08-02 DIAGNOSIS — Z5112 Encounter for antineoplastic immunotherapy: Secondary | ICD-10-CM | POA: Insufficient documentation

## 2017-08-02 DIAGNOSIS — C50312 Malignant neoplasm of lower-inner quadrant of left female breast: Secondary | ICD-10-CM

## 2017-08-02 DIAGNOSIS — Z170 Estrogen receptor positive status [ER+]: Secondary | ICD-10-CM | POA: Insufficient documentation

## 2017-08-02 DIAGNOSIS — Z79899 Other long term (current) drug therapy: Secondary | ICD-10-CM | POA: Insufficient documentation

## 2017-08-02 DIAGNOSIS — Z923 Personal history of irradiation: Secondary | ICD-10-CM | POA: Insufficient documentation

## 2017-08-02 DIAGNOSIS — Z79811 Long term (current) use of aromatase inhibitors: Secondary | ICD-10-CM | POA: Insufficient documentation

## 2017-08-02 DIAGNOSIS — Z9221 Personal history of antineoplastic chemotherapy: Secondary | ICD-10-CM | POA: Insufficient documentation

## 2017-08-02 MED ORDER — ACETAMINOPHEN 325 MG PO TABS
650.0000 mg | ORAL_TABLET | Freq: Once | ORAL | Status: AC
  Administered 2017-08-02: 650 mg via ORAL

## 2017-08-02 MED ORDER — SODIUM CHLORIDE 0.9 % IV SOLN
Freq: Once | INTRAVENOUS | Status: AC
  Administered 2017-08-02: 09:00:00 via INTRAVENOUS

## 2017-08-02 MED ORDER — ACETAMINOPHEN 325 MG PO TABS
ORAL_TABLET | ORAL | Status: AC
  Filled 2017-08-02: qty 2

## 2017-08-02 MED ORDER — TRASTUZUMAB CHEMO 150 MG IV SOLR
900.0000 mg | Freq: Once | INTRAVENOUS | Status: AC
  Administered 2017-08-02: 900 mg via INTRAVENOUS
  Filled 2017-08-02: qty 42.86

## 2017-08-02 MED ORDER — DIPHENHYDRAMINE HCL 25 MG PO CAPS
ORAL_CAPSULE | ORAL | Status: AC
  Filled 2017-08-02: qty 1

## 2017-08-02 MED ORDER — DIPHENHYDRAMINE HCL 25 MG PO CAPS
25.0000 mg | ORAL_CAPSULE | Freq: Once | ORAL | Status: AC
Start: 2017-08-02 — End: 2017-08-02
  Administered 2017-08-02: 25 mg via ORAL

## 2017-08-02 MED ORDER — HEPARIN SOD (PORK) LOCK FLUSH 100 UNIT/ML IV SOLN
500.0000 [IU] | Freq: Once | INTRAVENOUS | Status: AC | PRN
  Administered 2017-08-02: 500 [IU]
  Filled 2017-08-02: qty 5

## 2017-08-02 MED ORDER — SODIUM CHLORIDE 0.9% FLUSH
10.0000 mL | INTRAVENOUS | Status: DC | PRN
  Administered 2017-08-02: 10 mL
  Filled 2017-08-02: qty 10

## 2017-08-02 NOTE — Patient Instructions (Signed)
[facility] Discharge Instructions for Patients Receiving Chemotherapy               Pick Up Antibiotic at [facility]  Today you received the following chemotherapy agent: Herceptin.  To help prevent nausea and vomiting after your treatment, we encourage you to take your nausea medication as directed   If you develop nausea and vomiting that is not controlled by your nausea medication, call the clinic.   BELOW ARE SYMPTOMS THAT SHOULD BE REPORTED IMMEDIATELY:  *FEVER GREATER THAN 100.5 F  *CHILLS WITH OR WITHOUT FEVER  NAUSEA AND VOMITING THAT IS NOT CONTROLLED WITH YOUR NAUSEA MEDICATION  *UNUSUAL SHORTNESS OF BREATH  *UNUSUAL BRUISING OR BLEEDING  TENDERNESS IN MOUTH AND THROAT WITH OR WITHOUT PRESENCE OF ULCERS  *URINARY PROBLEMS  *BOWEL PROBLEMS  UNUSUAL RASH Items with * indicate a potential emergency and should be followed up as soon as possible.  Feel free to call the clinic should you have any questions or concerns. The clinic phone number is [phone].  Please show the [place] at check-in to the Emergency Department and triage nurse.

## 2017-08-20 ENCOUNTER — Other Ambulatory Visit: Payer: Self-pay

## 2017-08-20 DIAGNOSIS — Z170 Estrogen receptor positive status [ER+]: Principal | ICD-10-CM

## 2017-08-20 DIAGNOSIS — C50312 Malignant neoplasm of lower-inner quadrant of left female breast: Secondary | ICD-10-CM

## 2017-08-23 ENCOUNTER — Inpatient Hospital Stay: Payer: No Typology Code available for payment source

## 2017-08-23 ENCOUNTER — Inpatient Hospital Stay (HOSPITAL_BASED_OUTPATIENT_CLINIC_OR_DEPARTMENT_OTHER): Payer: No Typology Code available for payment source | Admitting: Hematology and Oncology

## 2017-08-23 ENCOUNTER — Telehealth: Payer: Self-pay | Admitting: Hematology and Oncology

## 2017-08-23 DIAGNOSIS — Z79899 Other long term (current) drug therapy: Secondary | ICD-10-CM

## 2017-08-23 DIAGNOSIS — C50312 Malignant neoplasm of lower-inner quadrant of left female breast: Secondary | ICD-10-CM

## 2017-08-23 DIAGNOSIS — Z923 Personal history of irradiation: Secondary | ICD-10-CM | POA: Diagnosis not present

## 2017-08-23 DIAGNOSIS — Z5112 Encounter for antineoplastic immunotherapy: Secondary | ICD-10-CM | POA: Diagnosis not present

## 2017-08-23 DIAGNOSIS — Z170 Estrogen receptor positive status [ER+]: Secondary | ICD-10-CM | POA: Diagnosis not present

## 2017-08-23 DIAGNOSIS — Z9221 Personal history of antineoplastic chemotherapy: Secondary | ICD-10-CM

## 2017-08-23 DIAGNOSIS — Z79811 Long term (current) use of aromatase inhibitors: Secondary | ICD-10-CM

## 2017-08-23 DIAGNOSIS — Z95828 Presence of other vascular implants and grafts: Secondary | ICD-10-CM

## 2017-08-23 LAB — CBC WITH DIFFERENTIAL (CANCER CENTER ONLY)
BASOS ABS: 0.1 10*3/uL (ref 0.0–0.1)
BASOS PCT: 1 %
EOS ABS: 0.4 10*3/uL (ref 0.0–0.5)
Eosinophils Relative: 3 %
HCT: 41.9 % (ref 34.8–46.6)
HEMOGLOBIN: 13.6 g/dL (ref 11.6–15.9)
Lymphocytes Relative: 19 %
Lymphs Abs: 2.2 10*3/uL (ref 0.9–3.3)
MCH: 26.8 pg (ref 25.1–34.0)
MCHC: 32.5 g/dL (ref 31.5–36.0)
MCV: 82.5 fL (ref 79.5–101.0)
Monocytes Absolute: 0.9 10*3/uL (ref 0.1–0.9)
Monocytes Relative: 8 %
NEUTROS PCT: 69 %
Neutro Abs: 8.1 10*3/uL — ABNORMAL HIGH (ref 1.5–6.5)
Platelet Count: 329 10*3/uL (ref 145–400)
RBC: 5.08 MIL/uL (ref 3.70–5.45)
RDW: 15.9 % — ABNORMAL HIGH (ref 11.2–14.5)
WBC: 11.7 10*3/uL — AB (ref 3.9–10.3)

## 2017-08-23 LAB — CMP (CANCER CENTER ONLY)
ALBUMIN: 3.2 g/dL — AB (ref 3.5–5.0)
ALK PHOS: 148 U/L (ref 40–150)
ALT: 17 U/L (ref 0–55)
AST: 12 U/L (ref 5–34)
Anion gap: 10 (ref 3–11)
BUN: 14 mg/dL (ref 7–26)
CALCIUM: 9.5 mg/dL (ref 8.4–10.4)
CO2: 25 mmol/L (ref 22–29)
CREATININE: 0.76 mg/dL (ref 0.60–1.10)
Chloride: 103 mmol/L (ref 98–109)
GFR, Est AFR Am: >60 mL/min (ref >60)
GFR, Estimated: >60 mL/min (ref >60)
GLUCOSE: 169 mg/dL — AB (ref 70–140)
Potassium: 4.1 mmol/L (ref 3.5–5.1)
SODIUM: 138 mmol/L (ref 136–145)
Total Bilirubin: 0.4 mg/dL (ref 0.2–1.2)
Total Protein: 7.6 g/dL (ref 6.4–8.3)

## 2017-08-23 MED ORDER — DIPHENHYDRAMINE HCL 25 MG PO CAPS
ORAL_CAPSULE | ORAL | Status: AC
  Filled 2017-08-23: qty 2

## 2017-08-23 MED ORDER — ACETAMINOPHEN 325 MG PO TABS
ORAL_TABLET | ORAL | Status: AC
  Filled 2017-08-23: qty 2

## 2017-08-23 MED ORDER — SODIUM CHLORIDE 0.9% FLUSH
10.0000 mL | INTRAVENOUS | Status: DC | PRN
  Administered 2017-08-23: 10 mL via INTRAVENOUS
  Filled 2017-08-23: qty 10

## 2017-08-23 MED ORDER — DIPHENHYDRAMINE HCL 25 MG PO CAPS
25.0000 mg | ORAL_CAPSULE | Freq: Once | ORAL | Status: AC
  Administered 2017-08-23: 25 mg via ORAL

## 2017-08-23 MED ORDER — ACETAMINOPHEN 325 MG PO TABS
650.0000 mg | ORAL_TABLET | Freq: Once | ORAL | Status: AC
  Administered 2017-08-23: 650 mg via ORAL

## 2017-08-23 MED ORDER — SODIUM CHLORIDE 0.9% FLUSH
10.0000 mL | INTRAVENOUS | Status: DC | PRN
  Administered 2017-08-23: 10 mL
  Filled 2017-08-23: qty 10

## 2017-08-23 MED ORDER — TRASTUZUMAB CHEMO 150 MG IV SOLR
900.0000 mg | Freq: Once | INTRAVENOUS | Status: AC
  Administered 2017-08-23: 900 mg via INTRAVENOUS
  Filled 2017-08-23: qty 42.86

## 2017-08-23 MED ORDER — HEPARIN SOD (PORK) LOCK FLUSH 100 UNIT/ML IV SOLN
500.0000 [IU] | Freq: Once | INTRAVENOUS | Status: AC | PRN
  Administered 2017-08-23: 500 [IU]
  Filled 2017-08-23: qty 5

## 2017-08-23 MED ORDER — SODIUM CHLORIDE 0.9 % IV SOLN
Freq: Once | INTRAVENOUS | Status: AC
  Administered 2017-08-23: 11:00:00 via INTRAVENOUS

## 2017-08-23 NOTE — Assessment & Plan Note (Signed)
Left breast biopsy02/20/2018:8:00 retroareolar position: IDC, high-grade with DCIS high-grade, ER 90%, PR 95%, Ki-67 20%, HER-2 positive ratio 3.4; screening detected left breast distortion LIQ 8 8:00 retroareolar 0.8 cm, axilla negative, T1b N0 stage IA clinical stage Left lumpectomy 09/22/2016: IDC grade 3, 1.3 cm, DCIS high-grade, margins clear, 0/1 lymph node negative, T1 CN 0 stage IA, ER 90%, PR 95%, Ki-67 20%, HER-2 positive ratio 3.4  Recommendation: 1. Adjuvant chemotherapy and Herceptin with weekly Taxol and Herceptin 12 completed 01/04/2017 followed by Herceptin maintenance for 1 year 3.Adjuvant radiation8/06/2016-03/16/2017 4. Followed by adjuvant antiestrogen therapy with letrozole 2.5 mg daily 5 yearsstarted 05/10/2017 ----------------------------------------------------------------------------- Current treatment: Herceptin maintenanceuntil end of April 2019 Diabetes: Patient being more careful about her diet  Anastrozole toxicities: Intermittent hot flashes Denies any arthralgias or myalgias  Return to clinic every 3 weeks for Herceptin and every 6 weeks for follow-up with me

## 2017-08-23 NOTE — Patient Instructions (Signed)
[facility] Discharge Instructions for Patients Receiving Chemotherapy  Today you received the following chemotherapy agents herceptin.  To help prevent nausea and vomiting after your treatment, we encourage you to take your nausea medication as directed.   If you develop nausea and vomiting that is not controlled by your nausea medication, call the clinic.   BELOW ARE SYMPTOMS THAT SHOULD BE REPORTED IMMEDIATELY:  *FEVER GREATER THAN 100.5 F  *CHILLS WITH OR WITHOUT FEVER  NAUSEA AND VOMITING THAT IS NOT CONTROLLED WITH YOUR NAUSEA MEDICATION  *UNUSUAL SHORTNESS OF BREATH  *UNUSUAL BRUISING OR BLEEDING  TENDERNESS IN MOUTH AND THROAT WITH OR WITHOUT PRESENCE OF ULCERS  *URINARY PROBLEMS  *BOWEL PROBLEMS  UNUSUAL RASH Items with * indicate a potential emergency and should be followed up as soon as possible.  Feel free to call the clinic should you have any questions or concerns. The clinic phone number is [phone].  Please show the CHEMO ALERT CARD at check-in to the Emergency Department and triage nurse.   

## 2017-08-23 NOTE — Telephone Encounter (Signed)
Gave avs and calendar for april °

## 2017-08-23 NOTE — Progress Notes (Signed)
 Patient Care Team: Jones, Thomas L, MD as PCP - General (Internal Medicine) Newman, David, MD as Consulting Physician (General Surgery) , , MD as Consulting Physician (Hematology and Oncology) Squire, Sarah, MD as Attending Physician (Radiation Oncology)  DIAGNOSIS:  Encounter Diagnosis  Name Primary?  . Malignant neoplasm of lower-inner quadrant of left breast in female, estrogen receptor positive ([facility])     SUMMARY OF ONCOLOGIC HISTORY:   Malignant neoplasm of lower-inner quadrant of left breast in female, estrogen receptor positive ([facility])   08/18/2016 Initial Diagnosis    Left breast biopsy 8:00 retroareolar position: IDC, high-grade with DCIS high-grade, ER 90%, PR 95%, Ki-67 20%, HER-2 positive ratio 3.4; screening detected left breast distortion LIQ 8 8:00 retroareolar 0.8 cm, axilla negative, T1b N0 stage IA clinical stage      09/22/2016 Surgery    Left lumpectomy: IDC grade 3, 1.3 cm, DCIS high-grade, margins clear, 0/1 lymph node negative, T1CN 0 stage IA, ER 90%, PR 95%, Ki-67 20%, HER-2 positive ratio 3.4      10/19/2016 - 01/04/2017 Chemotherapy    Taxol Herceptin weekly 12 followed by Herceptin maintenance every 3 weeks for 1 year       01/27/2017 - 03/16/2017 Radiation Therapy    Adjuvant radiation therapy      05/10/2017 -  Anti-estrogen oral therapy    Anastrozole 1 mg p.o. daily       CHIEF COMPLIANT: Follow-up on anastrozole and Herceptin  INTERVAL HISTORY: Sandra Wood is a 61-year-old with above-mentioned history of left breast cancer treated with lumpectomy and adjuvant chemotherapy with Herceptin.  She finished radiation therapy and is currently on Herceptin maintenance along with anastrozole.  She has no side effects on Herceptin.  With anastrozole initially she had a lot of emotional moodiness recently she has noticed that the symptoms have improved significantly.  She does not have any arthralgias or myalgias.  Denies any hot  flashes.  REVIEW OF SYSTEMS:   Constitutional: Denies fevers, chills or abnormal weight loss Eyes: Denies blurriness of vision Ears, nose, mouth, throat, and face: Denies mucositis or sore throat Respiratory: Denies cough, dyspnea or wheezes Cardiovascular: Denies palpitation, chest discomfort Gastrointestinal:  Denies nausea, heartburn or change in bowel habits Skin: Denies abnormal skin rashes Lymphatics: Denies new lymphadenopathy or easy bruising Neurological:Denies numbness, tingling or new weaknesses Behavioral/Psych: Mood is stable, no new changes  Extremities: No lower extremity edema  All other systems were reviewed with the patient and are negative.  I have reviewed the past medical history, past surgical history, social history and family history with the patient and they are unchanged from previous note.  ALLERGIES:  is allergic to no known allergies.  MEDICATIONS:  Current Outpatient Medications  Medication Sig Dispense Refill  . anastrozole (ARIMIDEX) 1 MG tablet Take 1 tablet (1 mg total) daily by mouth. 90 tablet 3  . blood glucose meter kit and supplies KIT Dispense based on patient and insurance preference. Use up to four times daily as directed. (FOR ICD-9 250.00, 250.01). Check blood sugar BID. 1 each 0  . glucose blood test strip Use as instructed 100 each 12  . ibuprofen (ADVIL,MOTRIN) 200 MG tablet Take 400-600 mg by mouth every 6 (six) hours as needed for headache or mild pain (depends on pain if takes 2-3 tablets).     . ipratropium-albuterol (DUONEB) 0.5-2.5 (3) MG/3ML SOLN Inhale 3 mLs into the lungs every 4 (four) hours as needed (shortness of breath).     . lidocaine-prilocaine (EMLA)   cream Apply 1 application as needed topically. 30 g 2  . losartan (COZAAR) 50 MG tablet Take 1 tablet (50 mg total) by mouth daily. 90 tablet 3  . PROAIR HFA 108 (90 Base) MCG/ACT inhaler Inhale 2 puffs into the lungs every 6 (six) hours as needed for wheezing or shortness of  breath.      No current facility-administered medications for this visit.     PHYSICAL EXAMINATION: ECOG PERFORMANCE STATUS: 1 - Symptomatic but completely ambulatory  Vitals:   08/23/17 0941  BP: 138/73  Pulse: 90  Resp: 17  Temp: 98.9 F (37.2 C)  SpO2: 91%   Filed Weights   08/23/17 0941  Weight: (!) 320 lb 8 oz (145.4 kg)    GENERAL:alert, no distress and comfortable SKIN: skin color, texture, turgor are normal, no rashes or significant lesions EYES: normal, Conjunctiva are pink and non-injected, sclera clear OROPHARYNX:no exudate, no erythema and lips, buccal mucosa, and tongue normal  NECK: supple, thyroid normal size, non-tender, without nodularity LYMPH:  no palpable lymphadenopathy in the cervical, axillary or inguinal LUNGS: clear to auscultation and percussion with normal breathing effort HEART: regular rate & rhythm and no murmurs and no lower extremity edema ABDOMEN:abdomen soft, non-tender and normal bowel sounds MUSCULOSKELETAL:no cyanosis of digits and no clubbing  NEURO: alert & oriented x 3 with fluent speech, no focal motor/sensory deficits EXTREMITIES: No lower extremity edema  LABORATORY DATA:  I have reviewed the data as listed CMP Latest Ref Rng & Units 07/12/2017 05/10/2017 04/19/2017  Glucose 70 - 140 mg/dL 159(H) 141(H) 143(H)  BUN 7 - 26 mg/dL 11 14.4 11.5  Creatinine 0.60 - 1.10 mg/dL 0.77 0.8 0.7  Sodium 136 - 145 mmol/L 139 137 140  Potassium 3.3 - 4.7 mmol/L 3.9 4.3 3.7  Chloride 98 - 109 mmol/L 103 - -  CO2 22 - 29 mmol/L 25 24 24  Calcium 8.4 - 10.4 mg/dL 9.5 9.5 9.2  Total Protein 6.4 - 8.3 g/dL 7.5 7.9 7.6  Total Bilirubin 0.2 - 1.2 mg/dL 0.7 0.54 0.51  Alkaline Phos 40 - 150 U/L 136 128 116  AST 5 - 34 U/L 12 13 13  ALT 0 - 55 U/L 16 17 15    Lab Results  Component Value Date   WBC 11.7 (H) 08/23/2017   HGB 14.6 05/10/2017   HCT 41.9 08/23/2017   MCV 82.5 08/23/2017   PLT 329 08/23/2017   NEUTROABS 8.1 (H) 08/23/2017     ASSESSMENT & PLAN:  Malignant neoplasm of lower-inner quadrant of left breast in female, estrogen receptor positive ([facility]) Left breast biopsy02/20/2018:8:00 retroareolar position: IDC, high-grade with DCIS high-grade, ER 90%, PR 95%, Ki-67 20%, HER-2 positive ratio 3.4; screening detected left breast distortion LIQ 8 8:00 retroareolar 0.8 cm, axilla negative, T1b N0 stage IA clinical stage Left lumpectomy 09/22/2016: IDC grade 3, 1.3 cm, DCIS high-grade, margins clear, 0/1 lymph node negative, T1 CN 0 stage IA, ER 90%, PR 95%, Ki-67 20%, HER-2 positive ratio 3.4  Recommendation: 1. Adjuvant chemotherapy and Herceptin with weekly Taxol and Herceptin 12 completed 01/04/2017 followed by Herceptin maintenance for 1 year 3.Adjuvant radiation8/06/2016-03/16/2017 4. Followed by adjuvant antiestrogen therapy with letrozole 2.5 mg daily 5 yearsstarted 05/10/2017 ----------------------------------------------------------------------------- Current treatment: Herceptin maintenanceuntil end of April 2019 Diabetes: Patient being more careful about her diet  Anastrozole toxicities: Intermittent hot flashes Denies any arthralgias or myalgias  Return to clinic every 3 weeks for Herceptin and every 6 weeks for follow-up with me She will finish   Herceptin on 10/04/2017. I sent a message to Dr. Lucia Gaskins to remove the port after that.   I spent 25 minutes talking to the patient of which more than half was spent in counseling and coordination of care.  No orders of the defined types were placed in this encounter.  The patient has a good understanding of the overall plan. she agrees with it. she will call with any problems that may develop before the next visit here.   Harriette Ohara, MD 08/23/17

## 2017-08-24 ENCOUNTER — Other Ambulatory Visit: Payer: Self-pay | Admitting: Surgery

## 2017-08-24 ENCOUNTER — Ambulatory Visit
Admission: RE | Admit: 2017-08-24 | Discharge: 2017-08-24 | Disposition: A | Discharge status: Home / Self Care | Payer: No Typology Code available for payment source | Source: Ambulatory Visit | Attending: Surgery | Admitting: Surgery

## 2017-08-24 DIAGNOSIS — R590 Localized enlarged lymph nodes: Secondary | ICD-10-CM

## 2017-08-24 DIAGNOSIS — N63 Unspecified lump in unspecified breast: Secondary | ICD-10-CM

## 2017-08-24 HISTORY — DX: Malignant neoplasm of unspecified site of unspecified female breast: C50.919

## 2017-08-24 HISTORY — DX: Personal history of irradiation: Z92.3

## 2017-08-24 HISTORY — DX: Personal history of antineoplastic chemotherapy: Z92.21

## 2017-09-08 ENCOUNTER — Encounter (HOSPITAL_COMMUNITY): Payer: Self-pay | Admitting: Cardiology

## 2017-09-08 ENCOUNTER — Ambulatory Visit (HOSPITAL_BASED_OUTPATIENT_CLINIC_OR_DEPARTMENT_OTHER)
Admission: RE | Admit: 2017-09-08 | Discharge: 2017-09-08 | Disposition: A | Discharge status: Home / Self Care | Payer: No Typology Code available for payment source | Source: Ambulatory Visit | Attending: Cardiology | Admitting: Cardiology

## 2017-09-08 ENCOUNTER — Ambulatory Visit (HOSPITAL_COMMUNITY)
Admission: RE | Admit: 2017-09-08 | Discharge: 2017-09-08 | Disposition: A | Discharge status: Home / Self Care | Payer: No Typology Code available for payment source | Source: Ambulatory Visit | Attending: Cardiology | Admitting: Cardiology

## 2017-09-08 VITALS — BP 160/76 | HR 86 | Wt 322.0 lb

## 2017-09-08 DIAGNOSIS — J4530 Mild persistent asthma, uncomplicated: Secondary | ICD-10-CM | POA: Diagnosis not present

## 2017-09-08 DIAGNOSIS — Z833 Family history of diabetes mellitus: Secondary | ICD-10-CM | POA: Diagnosis not present

## 2017-09-08 DIAGNOSIS — Z8249 Family history of ischemic heart disease and other diseases of the circulatory system: Secondary | ICD-10-CM | POA: Diagnosis not present

## 2017-09-08 DIAGNOSIS — I110 Hypertensive heart disease with heart failure: Secondary | ICD-10-CM | POA: Diagnosis not present

## 2017-09-08 DIAGNOSIS — Z170 Estrogen receptor positive status [ER+]: Secondary | ICD-10-CM

## 2017-09-08 DIAGNOSIS — E119 Type 2 diabetes mellitus without complications: Secondary | ICD-10-CM | POA: Diagnosis not present

## 2017-09-08 DIAGNOSIS — I10 Essential (primary) hypertension: Secondary | ICD-10-CM

## 2017-09-08 DIAGNOSIS — Z79899 Other long term (current) drug therapy: Secondary | ICD-10-CM | POA: Insufficient documentation

## 2017-09-08 DIAGNOSIS — C50312 Malignant neoplasm of lower-inner quadrant of left female breast: Secondary | ICD-10-CM

## 2017-09-08 DIAGNOSIS — I5030 Unspecified diastolic (congestive) heart failure: Secondary | ICD-10-CM | POA: Insufficient documentation

## 2017-09-08 DIAGNOSIS — Z803 Family history of malignant neoplasm of breast: Secondary | ICD-10-CM | POA: Diagnosis not present

## 2017-09-08 DIAGNOSIS — Z8042 Family history of malignant neoplasm of prostate: Secondary | ICD-10-CM | POA: Diagnosis not present

## 2017-09-08 DIAGNOSIS — Z87891 Personal history of nicotine dependence: Secondary | ICD-10-CM | POA: Insufficient documentation

## 2017-09-08 DIAGNOSIS — Z9889 Other specified postprocedural states: Secondary | ICD-10-CM | POA: Insufficient documentation

## 2017-09-08 DIAGNOSIS — Z853 Personal history of malignant neoplasm of breast: Secondary | ICD-10-CM | POA: Insufficient documentation

## 2017-09-08 DIAGNOSIS — K219 Gastro-esophageal reflux disease without esophagitis: Secondary | ICD-10-CM | POA: Diagnosis not present

## 2017-09-08 NOTE — Progress Notes (Signed)
  Echocardiogram 2D Echocardiogram has been performed.  Sandra Wood [name] 09/08/2017, 9:22 AM

## 2017-09-08 NOTE — Progress Notes (Signed)
Oncology: Dr. Lindi Adie  62 yo with history of DM2 and asthma has developed breast cancer.  Breast cancer is on left, ER+/PR+/HER2+.  She had left lumpectomy in 3/18 followed by Taxol + Herceptin to extend for 12 cycles starting 4/18.  She will continue Herceptin to complete a full year.  She has completed XRT.   She returns for cardio-oncology followup.  She has been doing well, no dyspnea or chest pain.  BP is high today but had a lot of back pain with echo, think this drove it up.  SBP 120s-130s when she checks at home.    Labs (9/18): K 4, creatinine 0.7 Labs (11/18): creatinine 0.8 Labs (2/19): K 4.1, creatinine 0.76  PMH: 1. Asthma: Mild persistent.  2. Type II diabetes. 3. GERD 4. Breast cancer: On left.  ER+/PR+/HER2+.  She had left lumpectomy in 4/18 followed by Taxol + Herceptin x 12 cycles then Herceptin to complete a full year.  - Echo (3/18): EF 60-65%, normal RV size and systolic function, strain imaging not done.  - Echo (6/18): EF 30-94%, normal diastolic function, GLS -07.6% but images were technically difficult.  - Echo (9/18): EF 55-60%, GLS -17.2% (strain images difficult), normal RV size and systolic function, mild LVH.  - Echo (12/18): EF 60-65%, strain images not done (difficult study).  - Echo (3/19): EF 55-60%, strain images not accurate.  5. HTN: Cough with lisinopril.   Social History   Socioeconomic History  . Marital status: Divorced    Spouse name: Not on file  . Number of children: Not on file  . Years of education: Not on file  . Highest education level: Not on file  Social Needs  . Financial resource strain: Not on file  . Food insecurity - worry: Not on file  . Food insecurity - inability: Not on file  . Transportation needs - medical: Not on file  . Transportation needs - non-medical: Not on file  Occupational History  . Not on file  Tobacco Use  . Smoking status: Former Smoker    Years: 5.00    Types: Cigarettes    Last attempt to quit:  08/13/1986    Years since quitting: 31.0  . Smokeless tobacco: Never Used  Substance and Sexual Activity  . Alcohol use: Yes    Comment: OCC.  . Drug use: No  . Sexual activity: Not Currently    Birth control/protection: Post-menopausal, Abstinence  Other Topics Concern  . Not on file  Social History Narrative  . Not on file    Family History  Problem Relation Age of Onset  . Diabetes Mother   . Heart disease Father   . Cancer Brother 33       prostate  . Breast cancer Maternal Grandmother 88   ROS: All systems reviewed and negative except as per HPI.   Current Outpatient Medications  Medication Sig Dispense Refill  . anastrozole (ARIMIDEX) 1 MG tablet Take 1 tablet (1 mg total) daily by mouth. 90 tablet 3  . blood glucose meter kit and supplies KIT Dispense based on patient and insurance preference. Use up to four times daily as directed. (FOR ICD-9 250.00, 250.01). Check blood sugar BID. 1 each 0  . glucose blood test strip Use as instructed 100 each 12  . ibuprofen (ADVIL,MOTRIN) 200 MG tablet Take 400-600 mg by mouth every 6 (six) hours as needed for headache or mild pain (depends on pain if takes 2-3 tablets).     Marland Kitchen ipratropium-albuterol (DUONEB)  0.5-2.5 (3) MG/3ML SOLN Inhale 3 mLs into the lungs every 4 (four) hours as needed (shortness of breath).     . lidocaine-prilocaine (EMLA) cream Apply 1 application as needed topically. 30 g 2  . losartan (COZAAR) 50 MG tablet Take 1 tablet (50 mg total) by mouth daily. 90 tablet 3  . PROAIR HFA 108 (90 Base) MCG/ACT inhaler Inhale 2 puffs into the lungs every 6 (six) hours as needed for wheezing or shortness of breath.      No current facility-administered medications for this encounter.    BP (!) 160/76   Pulse 86   Wt (!) 322 lb (146.1 kg)   LMP 06/30/2003   SpO2 94%   BMI 58.89 kg/m  General: NAD Neck: No JVD, no thyromegaly or thyroid nodule.  Lungs: Clear to auscultation bilaterally with normal respiratory  effort. CV: Nondisplaced PMI.  Heart regular S1/S2, no S3/S4, no murmur.  No peripheral edema.  No carotid bruit.  Normal pedal pulses.  Abdomen: Soft, nontender, no hepatosplenomegaly, no distention.  Skin: Intact without lesions or rashes.  Neurologic: Alert and oriented x 3.  Psych: Normal affect. Extremities: No clubbing or cyanosis.  HEENT: Normal.   Assessment/Plan: 1. Breast cancer: She started Herceptin-based therapy in 4/18, this will extend for a year.  I reviewed today's echo.  EF remains normal but strain again could not be done (technically difficult study).  - Continue Herceptin, will get last echo in 3 months (as she will be finishing Herceptin).  2. HTN: BP controlled at home, continue current losartan.    Sandra Wood 09/08/2017

## 2017-09-13 ENCOUNTER — Inpatient Hospital Stay: Payer: No Typology Code available for payment source | Attending: Hematology and Oncology

## 2017-09-13 VITALS — BP 144/71 | HR 78 | Temp 98.2°F | Resp 16

## 2017-09-13 DIAGNOSIS — Z5112 Encounter for antineoplastic immunotherapy: Secondary | ICD-10-CM | POA: Insufficient documentation

## 2017-09-13 DIAGNOSIS — Z79811 Long term (current) use of aromatase inhibitors: Secondary | ICD-10-CM | POA: Diagnosis not present

## 2017-09-13 DIAGNOSIS — Z9221 Personal history of antineoplastic chemotherapy: Secondary | ICD-10-CM | POA: Diagnosis not present

## 2017-09-13 DIAGNOSIS — Z79899 Other long term (current) drug therapy: Secondary | ICD-10-CM | POA: Diagnosis not present

## 2017-09-13 DIAGNOSIS — Z170 Estrogen receptor positive status [ER+]: Secondary | ICD-10-CM | POA: Insufficient documentation

## 2017-09-13 DIAGNOSIS — C50312 Malignant neoplasm of lower-inner quadrant of left female breast: Secondary | ICD-10-CM | POA: Diagnosis not present

## 2017-09-13 DIAGNOSIS — Z923 Personal history of irradiation: Secondary | ICD-10-CM | POA: Diagnosis not present

## 2017-09-13 MED ORDER — TRASTUZUMAB CHEMO 150 MG IV SOLR
900.0000 mg | Freq: Once | INTRAVENOUS | Status: AC
  Administered 2017-09-13: 900 mg via INTRAVENOUS
  Filled 2017-09-13: qty 42.86

## 2017-09-13 MED ORDER — HEPARIN SOD (PORK) LOCK FLUSH 100 UNIT/ML IV SOLN
500.0000 [IU] | Freq: Once | INTRAVENOUS | Status: AC | PRN
  Administered 2017-09-13: 500 [IU]
  Filled 2017-09-13: qty 5

## 2017-09-13 MED ORDER — ACETAMINOPHEN 325 MG PO TABS
650.0000 mg | ORAL_TABLET | Freq: Once | ORAL | Status: AC
  Administered 2017-09-13: 650 mg via ORAL

## 2017-09-13 MED ORDER — DIPHENHYDRAMINE HCL 25 MG PO CAPS
ORAL_CAPSULE | ORAL | Status: AC
  Filled 2017-09-13: qty 1

## 2017-09-13 MED ORDER — DIPHENHYDRAMINE HCL 25 MG PO CAPS
25.0000 mg | ORAL_CAPSULE | Freq: Once | ORAL | Status: AC
  Administered 2017-09-13: 25 mg via ORAL

## 2017-09-13 MED ORDER — ACETAMINOPHEN 325 MG PO TABS
ORAL_TABLET | ORAL | Status: AC
  Filled 2017-09-13: qty 2

## 2017-09-13 MED ORDER — SODIUM CHLORIDE 0.9% FLUSH
10.0000 mL | INTRAVENOUS | Status: DC | PRN
  Administered 2017-09-13: 10 mL
  Filled 2017-09-13: qty 10

## 2017-09-13 MED ORDER — SODIUM CHLORIDE 0.9 % IV SOLN
Freq: Once | INTRAVENOUS | Status: AC
  Administered 2017-09-13: 09:00:00 via INTRAVENOUS

## 2017-09-13 NOTE — Patient Instructions (Signed)
[facility] Discharge Instructions for Patients Receiving Chemotherapy  Today you received the following chemotherapy agents Herceptin  To help prevent nausea and vomiting after your treatment, we encourage you to take your nausea medication as directed   If you develop nausea and vomiting that is not controlled by your nausea medication, call the clinic.   BELOW ARE SYMPTOMS THAT SHOULD BE REPORTED IMMEDIATELY:  *FEVER GREATER THAN 100.5 F  *CHILLS WITH OR WITHOUT FEVER  NAUSEA AND VOMITING THAT IS NOT CONTROLLED WITH YOUR NAUSEA MEDICATION  *UNUSUAL SHORTNESS OF BREATH  *UNUSUAL BRUISING OR BLEEDING  TENDERNESS IN MOUTH AND THROAT WITH OR WITHOUT PRESENCE OF ULCERS  *URINARY PROBLEMS  *BOWEL PROBLEMS  UNUSUAL RASH Items with * indicate a potential emergency and should be followed up as soon as possible.  Feel free to call the clinic should you have any questions or concerns. The clinic phone number is [phone].  Please show the CHEMO ALERT CARD at check-in to the Emergency Department and triage nurse.   

## 2017-10-04 ENCOUNTER — Encounter: Payer: Self-pay | Admitting: *Deleted

## 2017-10-04 ENCOUNTER — Telehealth: Payer: Self-pay | Admitting: Hematology and Oncology

## 2017-10-04 ENCOUNTER — Inpatient Hospital Stay: Payer: No Typology Code available for payment source | Attending: Hematology and Oncology

## 2017-10-04 ENCOUNTER — Inpatient Hospital Stay (HOSPITAL_BASED_OUTPATIENT_CLINIC_OR_DEPARTMENT_OTHER): Payer: No Typology Code available for payment source | Admitting: Hematology and Oncology

## 2017-10-04 ENCOUNTER — Inpatient Hospital Stay: Payer: No Typology Code available for payment source

## 2017-10-04 DIAGNOSIS — C50312 Malignant neoplasm of lower-inner quadrant of left female breast: Secondary | ICD-10-CM | POA: Diagnosis not present

## 2017-10-04 DIAGNOSIS — Z5112 Encounter for antineoplastic immunotherapy: Secondary | ICD-10-CM | POA: Diagnosis not present

## 2017-10-04 DIAGNOSIS — Z79899 Other long term (current) drug therapy: Secondary | ICD-10-CM | POA: Insufficient documentation

## 2017-10-04 DIAGNOSIS — Z79811 Long term (current) use of aromatase inhibitors: Secondary | ICD-10-CM | POA: Diagnosis not present

## 2017-10-04 DIAGNOSIS — Z170 Estrogen receptor positive status [ER+]: Principal | ICD-10-CM

## 2017-10-04 DIAGNOSIS — E119 Type 2 diabetes mellitus without complications: Secondary | ICD-10-CM | POA: Insufficient documentation

## 2017-10-04 DIAGNOSIS — Z95828 Presence of other vascular implants and grafts: Secondary | ICD-10-CM

## 2017-10-04 LAB — CBC WITH DIFFERENTIAL (CANCER CENTER ONLY)
Basophils Absolute: 0.1 10*3/uL (ref 0.0–0.1)
Basophils Relative: 1 %
EOS PCT: 3 %
Eosinophils Absolute: 0.4 10*3/uL (ref 0.0–0.5)
HEMATOCRIT: 43.9 % (ref 34.8–46.6)
Hemoglobin: 14.1 g/dL (ref 11.6–15.9)
LYMPHS PCT: 20 %
Lymphs Abs: 2.1 10*3/uL (ref 0.9–3.3)
MCH: 26.8 pg (ref 25.1–34.0)
MCHC: 32.2 g/dL (ref 31.5–36.0)
MCV: 83.1 fL (ref 79.5–101.0)
MONO ABS: 0.8 10*3/uL (ref 0.1–0.9)
MONOS PCT: 8 %
Neutro Abs: 7.2 10*3/uL — ABNORMAL HIGH (ref 1.5–6.5)
Neutrophils Relative %: 68 %
PLATELETS: 339 10*3/uL (ref 145–400)
RBC: 5.28 MIL/uL (ref 3.70–5.45)
RDW: 15.4 % — AB (ref 11.2–14.5)
WBC Count: 10.4 10*3/uL — ABNORMAL HIGH (ref 3.9–10.3)

## 2017-10-04 LAB — CMP (CANCER CENTER ONLY)
ALBUMIN: 3.3 g/dL — AB (ref 3.5–5.0)
ALK PHOS: 150 U/L (ref 40–150)
ALT: 19 U/L (ref 0–55)
AST: 13 U/L (ref 5–34)
Anion gap: 12 — ABNORMAL HIGH (ref 3–11)
BILIRUBIN TOTAL: 0.5 mg/dL (ref 0.2–1.2)
BUN: 13 mg/dL (ref 7–26)
CALCIUM: 9.8 mg/dL (ref 8.4–10.4)
CO2: 25 mmol/L (ref 22–29)
Chloride: 103 mmol/L (ref 98–109)
Creatinine: 0.75 mg/dL (ref 0.60–1.10)
GFR, Est AFR Am: >60 mL/min (ref >60)
GFR, Estimated: >60 mL/min (ref >60)
GLUCOSE: 167 mg/dL — AB (ref 70–140)
Potassium: 3.9 mmol/L (ref 3.5–5.1)
SODIUM: 140 mmol/L (ref 136–145)
TOTAL PROTEIN: 7.6 g/dL (ref 6.4–8.3)

## 2017-10-04 MED ORDER — DIPHENHYDRAMINE HCL 25 MG PO CAPS
25.0000 mg | ORAL_CAPSULE | Freq: Once | ORAL | Status: AC
  Administered 2017-10-04: 25 mg via ORAL

## 2017-10-04 MED ORDER — ACETAMINOPHEN 325 MG PO TABS
650.0000 mg | ORAL_TABLET | Freq: Once | ORAL | Status: AC
  Administered 2017-10-04: 650 mg via ORAL

## 2017-10-04 MED ORDER — DIPHENHYDRAMINE HCL 25 MG PO CAPS
ORAL_CAPSULE | ORAL | Status: AC
  Filled 2017-10-04: qty 1

## 2017-10-04 MED ORDER — ACETAMINOPHEN 325 MG PO TABS
ORAL_TABLET | ORAL | Status: AC
  Filled 2017-10-04: qty 2

## 2017-10-04 MED ORDER — SODIUM CHLORIDE 0.9 % IV SOLN
Freq: Once | INTRAVENOUS | Status: AC
  Administered 2017-10-04: 11:00:00 via INTRAVENOUS

## 2017-10-04 MED ORDER — TRASTUZUMAB CHEMO 150 MG IV SOLR
900.0000 mg | Freq: Once | INTRAVENOUS | Status: AC
  Administered 2017-10-04: 900 mg via INTRAVENOUS
  Filled 2017-10-04: qty 42.86

## 2017-10-04 MED ORDER — SODIUM CHLORIDE 0.9% FLUSH
10.0000 mL | INTRAVENOUS | Status: DC | PRN
  Administered 2017-10-04: 10 mL
  Filled 2017-10-04: qty 10

## 2017-10-04 MED ORDER — HEPARIN SOD (PORK) LOCK FLUSH 100 UNIT/ML IV SOLN
500.0000 [IU] | Freq: Once | INTRAVENOUS | Status: AC | PRN
  Administered 2017-10-04: 500 [IU]
  Filled 2017-10-04: qty 5

## 2017-10-04 MED ORDER — SODIUM CHLORIDE 0.9% FLUSH
10.0000 mL | INTRAVENOUS | Status: DC | PRN
  Administered 2017-10-04: 10 mL via INTRAVENOUS
  Filled 2017-10-04: qty 10

## 2017-10-04 NOTE — Patient Instructions (Signed)
[facility] Discharge Instructions for Patients Receiving Chemotherapy  Today you received the following chemotherapy agents Herceptin  To help prevent nausea and vomiting after your treatment, we encourage you to take your nausea medication as directed   If you develop nausea and vomiting that is not controlled by your nausea medication, call the clinic.   BELOW ARE SYMPTOMS THAT SHOULD BE REPORTED IMMEDIATELY:  *FEVER GREATER THAN 100.5 F  *CHILLS WITH OR WITHOUT FEVER  NAUSEA AND VOMITING THAT IS NOT CONTROLLED WITH YOUR NAUSEA MEDICATION  *UNUSUAL SHORTNESS OF BREATH  *UNUSUAL BRUISING OR BLEEDING  TENDERNESS IN MOUTH AND THROAT WITH OR WITHOUT PRESENCE OF ULCERS  *URINARY PROBLEMS  *BOWEL PROBLEMS  UNUSUAL RASH Items with * indicate a potential emergency and should be followed up as soon as possible.  Feel free to call the clinic should you have any questions or concerns. The clinic phone number is [phone].  Please show the CHEMO ALERT CARD at check-in to the Emergency Department and triage nurse.   

## 2017-10-04 NOTE — Telephone Encounter (Signed)
Gave avs and calendar ° °

## 2017-10-04 NOTE — Assessment & Plan Note (Signed)
Left breast biopsy02/20/2018:8:00 retroareolar position: IDC, high-grade with DCIS high-grade, ER 90%, PR 95%, Ki-67 20%, HER-2 positive ratio 3.4; screening detected left breast distortion LIQ 8 8:00 retroareolar 0.8 cm, axilla negative, T1b N0 stage IA clinical stage Left lumpectomy 09/22/2016: IDC grade 3, 1.3 cm, DCIS high-grade, margins clear, 0/1 lymph node negative, T1 CN 0 stage IA, ER 90%, PR 95%, Ki-67 20%, HER-2 positive ratio 3.4  Treatment summary: 1. Adjuvant chemotherapy and Herceptin with weekly Taxol and Herceptin 12 completed 01/04/2017 followed by Herceptin maintenance for 1 year completed 10/04/2017 3.Adjuvant radiation8/06/2016-03/16/2017 4. Adjuvant antiestrogen therapy with letrozole 2.5 mg daily 5 yearsstarted 05/10/2017 ----------------------------------------------------------------------------- Current treatment: Herceptin maintenance: Today's last treatment  Diabetes: No change in the current plan  Anastrozoletoxicities: Intermittent hot flashes Denies any arthralgias or myalgias  Return to clinic in 6 months for follow-up

## 2017-10-04 NOTE — Progress Notes (Signed)
Patient Care Team: Janith Lima, MD as PCP - General (Internal Medicine) Alphonsa Overall, MD as Consulting Physician (General Surgery) Nicholas Lose, MD as Consulting Physician (Hematology and Oncology) Eppie Gibson, MD as Attending Physician (Radiation Oncology)  DIAGNOSIS:  Encounter Diagnosis  Name Primary?  . Malignant neoplasm of lower-inner quadrant of left breast in female, estrogen receptor positive ([place])     SUMMARY OF ONCOLOGIC HISTORY:   Malignant neoplasm of lower-inner quadrant of left breast in female, estrogen receptor positive ([place])   08/18/2016 Initial Diagnosis    Left breast biopsy 8:00 retroareolar position: IDC, high-grade with DCIS high-grade, ER 90%, PR 95%, Ki-67 20%, HER-2 positive ratio 3.4; screening detected left breast distortion LIQ 8 8:00 retroareolar 0.8 cm, axilla negative, T1b N0 stage IA clinical stage      09/22/2016 Surgery    Left lumpectomy: IDC grade 3, 1.3 cm, DCIS high-grade, margins clear, 0/1 lymph node negative, T1CN 0 stage IA, ER 90%, PR 95%, Ki-67 20%, HER-2 positive ratio 3.4      10/19/2016 - 01/04/2017 Chemotherapy    Taxol Herceptin weekly 12 followed by Herceptin maintenance every 3 weeks for 1 year       01/27/2017 - 03/16/2017 Radiation Therapy    Adjuvant radiation therapy      05/10/2017 -  Anti-estrogen oral therapy    Anastrozole 1 mg p.o. daily       CHIEF COMPLIANT: Last cycle of Herceptin  INTERVAL HISTORY: Sandra Wood is a 62 year old with above-mentioned history of left breast cancer treated with lumpectomy followed by adjuvant chemotherapy with Taxol Herceptin and is currently on anastrozole therapy.  Today is her last dose of Herceptin.  She will be seeing Dr. Dalbert Batman to get the port out.  REVIEW OF SYSTEMS:   Constitutional: Denies fevers, chills or abnormal weight loss Eyes: Denies blurriness of vision Ears, nose, mouth, throat, and face: Denies mucositis or sore throat Respiratory: Denies cough,  dyspnea or wheezes Cardiovascular: Denies palpitation, chest discomfort Gastrointestinal:  Denies nausea, heartburn or change in bowel habits Skin: Denies abnormal skin rashes Lymphatics: Denies new lymphadenopathy or easy bruising Neurological:Denies numbness, tingling or new weaknesses Behavioral/Psych: Mood is stable, no new changes  Extremities: No lower extremity edema Breast:  denies any pain or lumps or nodules in either breasts All other systems were reviewed with the patient and are negative.  I have reviewed the past medical history, past surgical history, social history and family history with the patient and they are unchanged from previous note.  ALLERGIES:  is allergic to no known allergies.  MEDICATIONS:  Current Outpatient Medications  Medication Sig Dispense Refill  . anastrozole (ARIMIDEX) 1 MG tablet Take 1 tablet (1 mg total) daily by mouth. 90 tablet 3  . blood glucose meter kit and supplies KIT Dispense based on patient and insurance preference. Use up to four times daily as directed. (FOR ICD-9 250.00, 250.01). Check blood sugar BID. 1 each 0  . glucose blood test strip Use as instructed 100 each 12  . ibuprofen (ADVIL,MOTRIN) 200 MG tablet Take 400-600 mg by mouth every 6 (six) hours as needed for headache or mild pain (depends on pain if takes 2-3 tablets).     Marland Kitchen ipratropium-albuterol (DUONEB) 0.5-2.5 (3) MG/3ML SOLN Inhale 3 mLs into the lungs every 4 (four) hours as needed (shortness of breath).     . lidocaine-prilocaine (EMLA) cream Apply 1 application as needed topically. 30 g 2  . losartan (COZAAR) 50 MG tablet Take 1  tablet (50 mg total) by mouth daily. 90 tablet 3  . PROAIR HFA 108 (90 Base) MCG/ACT inhaler Inhale 2 puffs into the lungs every 6 (six) hours as needed for wheezing or shortness of breath.      No current facility-administered medications for this visit.    Facility-Administered Medications Ordered in Other Visits  Medication Dose Route  Frequency Provider Last Rate Last Dose  . sodium chloride flush (NS) 0.9 % injection 10 mL  10 mL Intravenous PRN Nicholas Lose, MD   10 mL at 10/04/17 0921    PHYSICAL EXAMINATION: ECOG PERFORMANCE STATUS: 1 - Symptomatic but completely ambulatory  Vitals:   10/04/17 0929  BP: (!) 144/82  Pulse: 91  Resp: 17  Temp: 99 F (37.2 C)  SpO2: 93%   Filed Weights   10/04/17 0929  Weight: (!) 320 lb 6.4 oz (145.3 kg)    GENERAL:alert, no distress and comfortable SKIN: skin color, texture, turgor are normal, no rashes or significant lesions EYES: normal, Conjunctiva are pink and non-injected, sclera clear OROPHARYNX:no exudate, no erythema and lips, buccal mucosa, and tongue normal  NECK: supple, thyroid normal size, non-tender, without nodularity LYMPH:  no palpable lymphadenopathy in the cervical, axillary or inguinal LUNGS: clear to auscultation and percussion with normal breathing effort HEART: regular rate & rhythm and no murmurs and no lower extremity edema ABDOMEN:abdomen soft, non-tender and normal bowel sounds MUSCULOSKELETAL:no cyanosis of digits and no clubbing  NEURO: alert & oriented x 3 with fluent speech, no focal motor/sensory deficits EXTREMITIES: No lower extremity edema  LABORATORY DATA:  I have reviewed the data as listed CMP Latest Ref Rng & Units 08/23/2017 07/12/2017 05/10/2017  Glucose 70 - 140 mg/dL 169(H) 159(H) 141(H)  BUN 7 - 26 mg/dL 14 11 14.4  Creatinine 0.60 - 1.10 mg/dL 0.76 0.77 0.8  Sodium 136 - 145 mmol/L 138 139 137  Potassium 3.5 - 5.1 mmol/L 4.1 3.9 4.3  Chloride 98 - 109 mmol/L 103 103 -  CO2 22 - 29 mmol/L '25 25 24  '$ Calcium 8.4 - 10.4 mg/dL 9.5 9.5 9.5  Total Protein 6.4 - 8.3 g/dL 7.6 7.5 7.9  Total Bilirubin 0.2 - 1.2 mg/dL 0.4 0.7 0.54  Alkaline Phos 40 - 150 U/L 148 136 128  AST 5 - 34 U/L '12 12 13  '$ ALT 0 - 55 U/L '17 16 17    '$ Lab Results  Component Value Date   WBC 11.7 (H) 08/23/2017   HGB 14.6 05/10/2017   HCT 41.9  08/23/2017   MCV 82.5 08/23/2017   PLT 329 08/23/2017   NEUTROABS 8.1 (H) 08/23/2017    ASSESSMENT & PLAN:  Malignant neoplasm of lower-inner quadrant of left breast in female, estrogen receptor positive ([facility]) Left breast biopsy02/20/2018:8:00 retroareolar position: IDC, high-grade with DCIS high-grade, ER 90%, PR 95%, Ki-67 20%, HER-2 positive ratio 3.4; screening detected left breast distortion LIQ 8 8:00 retroareolar 0.8 cm, axilla negative, T1b N0 stage IA clinical stage Left lumpectomy 09/22/2016: IDC grade 3, 1.3 cm, DCIS high-grade, margins clear, 0/1 lymph node negative, T1 CN 0 stage IA, ER 90%, PR 95%, Ki-67 20%, HER-2 positive ratio 3.4  Treatment summary: 1. Adjuvant chemotherapy and Herceptin with weekly Taxol and Herceptin 12 completed 01/04/2017 followed by Herceptin maintenance for 1 year completed 10/04/2017 3.Adjuvant radiation8/06/2016-03/16/2017 4. Adjuvant antiestrogen therapy with letrozole 2.5 mg daily 5 yearsstarted 05/10/2017 ----------------------------------------------------------------------------- Current treatment: Herceptin maintenance: Today's last treatment  Diabetes: No change in the current plan  Anastrozoletoxicities: Intermittent hot flashes  Denies any arthralgias or myalgias  I briefly discussed with her about the role of Neratinib I provided her with literature on the drug.  I discussed with her the greatest benefit is mostly lymph node positive patients.  The fact that she is lymph node negative with higher likelihood of benefit from Neratinib is likely to be very small and would be of the order of 1-2%. We will finalize a decision on the right knee when she comes back in 6 months.  Return to clinic in 6 months for follow-up   No orders of the defined types were placed in this encounter.  The patient has a good understanding of the overall plan. she agrees with it. she will call with any problems that may develop before the next visit  here.   Harriette Ohara, MD 10/04/17

## 2017-12-07 ENCOUNTER — Other Ambulatory Visit (HOSPITAL_COMMUNITY): Payer: Self-pay | Admitting: *Deleted

## 2017-12-07 ENCOUNTER — Ambulatory Visit (HOSPITAL_COMMUNITY)
Admission: RE | Admit: 2017-12-07 | Discharge: 2017-12-07 | Disposition: A | Discharge status: Home / Self Care | Payer: No Typology Code available for payment source | Source: Ambulatory Visit | Attending: Cardiology | Admitting: Cardiology

## 2017-12-07 ENCOUNTER — Other Ambulatory Visit: Payer: Self-pay

## 2017-12-07 ENCOUNTER — Encounter (HOSPITAL_COMMUNITY): Payer: Self-pay | Admitting: Cardiology

## 2017-12-07 ENCOUNTER — Ambulatory Visit (HOSPITAL_BASED_OUTPATIENT_CLINIC_OR_DEPARTMENT_OTHER)
Admission: RE | Admit: 2017-12-07 | Discharge: 2017-12-07 | Disposition: A | Discharge status: Home / Self Care | Payer: No Typology Code available for payment source | Source: Ambulatory Visit | Attending: Cardiology | Admitting: Cardiology

## 2017-12-07 VITALS — BP 144/66 | HR 82 | Wt 324.2 lb

## 2017-12-07 DIAGNOSIS — C50312 Malignant neoplasm of lower-inner quadrant of left female breast: Secondary | ICD-10-CM | POA: Diagnosis not present

## 2017-12-07 DIAGNOSIS — K219 Gastro-esophageal reflux disease without esophagitis: Secondary | ICD-10-CM | POA: Diagnosis not present

## 2017-12-07 DIAGNOSIS — J45909 Unspecified asthma, uncomplicated: Secondary | ICD-10-CM | POA: Insufficient documentation

## 2017-12-07 DIAGNOSIS — E119 Type 2 diabetes mellitus without complications: Secondary | ICD-10-CM | POA: Diagnosis not present

## 2017-12-07 DIAGNOSIS — C50912 Malignant neoplasm of unspecified site of left female breast: Secondary | ICD-10-CM | POA: Diagnosis not present

## 2017-12-07 DIAGNOSIS — Z87891 Personal history of nicotine dependence: Secondary | ICD-10-CM | POA: Diagnosis not present

## 2017-12-07 DIAGNOSIS — Z803 Family history of malignant neoplasm of breast: Secondary | ICD-10-CM | POA: Insufficient documentation

## 2017-12-07 DIAGNOSIS — Z79899 Other long term (current) drug therapy: Secondary | ICD-10-CM | POA: Insufficient documentation

## 2017-12-07 DIAGNOSIS — Z791 Long term (current) use of non-steroidal anti-inflammatories (NSAID): Secondary | ICD-10-CM | POA: Insufficient documentation

## 2017-12-07 DIAGNOSIS — I10 Essential (primary) hypertension: Secondary | ICD-10-CM | POA: Insufficient documentation

## 2017-12-07 DIAGNOSIS — Z8042 Family history of malignant neoplasm of prostate: Secondary | ICD-10-CM | POA: Insufficient documentation

## 2017-12-07 DIAGNOSIS — Z170 Estrogen receptor positive status [ER+]: Secondary | ICD-10-CM | POA: Insufficient documentation

## 2017-12-07 DIAGNOSIS — Z833 Family history of diabetes mellitus: Secondary | ICD-10-CM | POA: Diagnosis not present

## 2017-12-07 DIAGNOSIS — Z8249 Family history of ischemic heart disease and other diseases of the circulatory system: Secondary | ICD-10-CM | POA: Diagnosis not present

## 2017-12-07 DIAGNOSIS — Z09 Encounter for follow-up examination after completed treatment for conditions other than malignant neoplasm: Secondary | ICD-10-CM | POA: Diagnosis not present

## 2017-12-07 NOTE — Progress Notes (Signed)
Oncology: Dr. Lindi Adie  62 y.o. with history of DM2 and asthma has developed breast cancer.  Breast cancer is on left, ER+/PR+/HER2+.  She had left lumpectomy in 3/18 followed by Taxol + Herceptin to extend for 12 cycles starting 4/18.  She has now completed Herceptin.  She has completed XRT.   She returns for cardio-oncology followup.  No complaints today, no dyspnea or chest pain.  BP is elevated today in the office but has been controlled when she checks at home.  Labs (9/18): K 4, creatinine 0.7 Labs (11/18): creatinine 0.8 Labs (2/19): K 4.1, creatinine 0.76  PMH: 1. Asthma: Mild persistent.  2. Type II diabetes. 3. GERD 4. Breast cancer: On left.  ER+/PR+/HER2+.  She had left lumpectomy in 4/18 followed by Taxol + Herceptin x 12 cycles then Herceptin to complete a full year.  - Echo (3/18): EF 60-65%, normal RV size and systolic function, strain imaging not done.  - Echo (6/18): EF 56-43%, normal diastolic function, GLS -32.9% but images were technically difficult.  - Echo (9/18): EF 55-60%, GLS -17.2% (strain images difficult), normal RV size and systolic function, mild LVH.  - Echo (12/18): EF 60-65%, strain images not done (difficult study).  - Echo (3/19): EF 55-60%, strain images not accurate.  - Echo (6/19): EF 60-65%, mild LVH, GLS -19.8%.  5. HTN: Cough with lisinopril.   Social History   Socioeconomic History  . Marital status: Divorced    Spouse name: Not on file  . Number of children: Not on file  . Years of education: Not on file  . Highest education level: Not on file  Occupational History  . Not on file  Social Needs  . Financial resource strain: Not on file  . Food insecurity:    Worry: Not on file    Inability: Not on file  . Transportation needs:    Medical: Not on file    Non-medical: Not on file  Tobacco Use  . Smoking status: Former Smoker    Years: 5.00    Types: Cigarettes    Last attempt to quit: 08/13/1986    Years since quitting: 31.3  .  Smokeless tobacco: Never Used  Substance and Sexual Activity  . Alcohol use: Yes    Comment: OCC.  . Drug use: No  . Sexual activity: Not Currently    Birth control/protection: Post-menopausal, Abstinence  Lifestyle  . Physical activity:    Days per week: Not on file    Minutes per session: Not on file  . Stress: Not on file  Relationships  . Social connections:    Talks on phone: Not on file    Gets together: Not on file    Attends religious service: Not on file    Active member of club or organization: Not on file    Attends meetings of clubs or organizations: Not on file    Relationship status: Not on file  . Intimate partner violence:    Fear of current or ex partner: Not on file    Emotionally abused: Not on file    Physically abused: Not on file    Forced sexual activity: Not on file  Other Topics Concern  . Not on file  Social History Narrative  . Not on file    Family History  Problem Relation Age of Onset  . Diabetes Mother   . Heart disease Father   . Cancer Brother 69       prostate  . Breast cancer  Maternal Grandmother 88   ROS: All systems reviewed and negative except as per HPI.   Current Outpatient Medications  Medication Sig Dispense Refill  . anastrozole (ARIMIDEX) 1 MG tablet Take 1 tablet (1 mg total) daily by mouth. 90 tablet 3  . blood glucose meter kit and supplies KIT Dispense based on patient and insurance preference. Use up to four times daily as directed. (FOR ICD-9 250.00, 250.01). Check blood sugar BID. 1 each 0  . glucose blood test strip Use as instructed 100 each 12  . ibuprofen (ADVIL,MOTRIN) 200 MG tablet Take 400-600 mg by mouth every 6 (six) hours as needed for headache or mild pain (depends on pain if takes 2-3 tablets).     Marland Kitchen ipratropium-albuterol (DUONEB) 0.5-2.5 (3) MG/3ML SOLN Inhale 3 mLs into the lungs every 4 (four) hours as needed (shortness of breath).     . lidocaine-prilocaine (EMLA) cream Apply 1 application as needed  topically. 30 g 2  . losartan (COZAAR) 50 MG tablet Take 1 tablet (50 mg total) by mouth daily. 90 tablet 3  . PROAIR HFA 108 (90 Base) MCG/ACT inhaler Inhale 2 puffs into the lungs every 6 (six) hours as needed for wheezing or shortness of breath.      No current facility-administered medications for this encounter.    BP (!) 144/66   Pulse 82   Wt (!) 324 lb 4 oz (147.1 kg)   LMP 06/30/2003   SpO2 98%   BMI 59.31 kg/m  General: NAD, obese.  Neck: No JVD, no thyromegaly or thyroid nodule.  Lungs: Clear to auscultation bilaterally with normal respiratory effort. CV: Nondisplaced PMI.  Heart regular S1/S2, no S3/S4, no murmur.  No peripheral edema.  No carotid bruit.  Normal pedal pulses.  Abdomen: Soft, nontender, no hepatosplenomegaly, no distention.  Skin: Intact without lesions or rashes.  Neurologic: Alert and oriented x 3.  Psych: Normal affect. Extremities: No clubbing or cyanosis.  HEENT: Normal.   Assessment/Plan: 1. Breast cancer: She has recently completed Herceptin course.  I reviewed today's echo.  EF and strain look normal.    - She will not need further screening echoes.  2. HTN: BP controlled at home, continue current losartan.    Sandra Wood 12/07/2017

## 2017-12-07 NOTE — Progress Notes (Signed)
  Echocardiogram 2D Echocardiogram has been performed.  Sandra Wood 12/07/2017, 10:03 AM

## 2017-12-07 NOTE — Patient Instructions (Signed)
Your physician recommends that you schedule a follow-up appointment in: As needed  

## 2017-12-17 ENCOUNTER — Ambulatory Visit: Payer: Self-pay | Admitting: Surgery

## 2017-12-20 ENCOUNTER — Other Ambulatory Visit: Payer: Self-pay

## 2017-12-20 ENCOUNTER — Encounter (HOSPITAL_COMMUNITY): Payer: Self-pay | Admitting: *Deleted

## 2017-12-20 NOTE — H&P (Signed)
Sandra Wood  Location: [facility] Patient #: [id number] DOB: Jul 01, 1955 Divorced / Language: Sandra Wood / Race: White Female  History of Present Illness   The patient is a 62 year old female who presents with a complaint of Left breast cancer.  The PCP is Dr. Scarlette Calico  She seen at the [facility] - Drs. Lindi Adie and Kinard  The patient was referred by Dr. Everlean Alstrom. She comes by herself. She is a little frustrated because I am late.  Doing okay. She last saw Dr. Lindi Adie on 10/04/2017. She had a negatvie mammogram on 08/24/2017 at [facility]. She is ready to get the port out. I discussed doing this under local at one of the outpatient centers. I will otherwise see her back in 6 months.  Past Medical History: 1. Left breast cancer (8 o'clock) Left breast biopsy on 08/18/2016 [phone]) - IDC, High grade, ER - 90%, PR - 95%, Ki67 - 20%, and Her2Neu POSITIVE Left breast lumpectomy and left axillary SLNBx on 09/22/2016. Pathology [phone]) - 1.3 cm IDC, broadly less than 0.1 cm from anterior margin (this is nipple), 0/1 nodes. Rad tx 01/27/2017 - 03/16/2017 On herceptin for one year - completed 10/04/2017. On Anastrazole 2. Morbidly obese 3. Stopped smoking in the 1980's 4. Has never had a colonoscopy 5. Right IJ power port - 09/22/2016 - D. [employer]  Social History: Unmarried. She has two children: [place] ([place]) - 19 yo, and [place] - 64 yo. She keeps her grandson, who is 54 yo and has seizures. She worked at the [employer] - night shift -11PM to 7AM. She did not go back to work. She is planning to use all her sick leave, then retire (04/2017) So she is retired.  Allergies (Tanisha A. Owens Shark, [place]; 10/08/2017 4:37 PM) No Known Drug Allergies [10/14/2016]: Allergies Reconciled   Medication History (Tanisha A. Owens Shark, [place]; 10/08/2017 4:37 PM) Ondansetron  HCl ('8MG'$  Tablet, Oral) Active. ProAir HFA (108 (90 Base)MCG/ACT Aerosol Soln, Inhalation) Active. LORazepam (0.'5MG'$  Tablet, Oral) Active. Ibuprofen ('200MG'$  Capsule, Oral) Active. Ipratropium-Albuterol (0.5-2.5 (3)MG/3ML Solution, Inhalation) Active. Zoloft ('50MG'$  Tablet, Oral) Active. Medications Reconciled  Vitals (Tanisha A. Brown RMA; 10/08/2017 4:37 PM) 10/08/2017 4:36 PM Weight: 321.6 lb Height: 63in Body Surface Area: 2.37 m Body Mass Index: 56.97 kg/m  Temp.: 97.48F  Pulse: 87 (Regular)  BP: 136/84 (Sitting, Left Arm, Standard)  Physical Exam  General: Morbidly obese WF alert.  Skin: Inspection and palpation of the skin unremarkable.  Eyes: Conjunctivae white, pupils equal. Face, ears, nose, mouth, and throat: Face - normal. Normal ears and nose. Lips and teeth normal.  Neck: Supple. No mass. Trachea midline. No thyroid mass.  Lymph Nodes: No supraclavicular or cervical adenopathy. No axillary adenopathy.   Breasts: Right - No mass or nodule. Port in [place] of right breast looks good.  Left - Left breast circumareolar incision looks good. Left axillary incision looks good.   She has some thickening of the skin. No mass or nodule.  Extremities: Good strength of left arm. No lymphedema.   Assessment & Plan  1.  MALIGNANT NEOPLASM OF LEFT BREAST, STAGE 1, ESTROGEN RECEPTOR POSITIVE (C50.912)  Story: She had a mammogram at [facility] on 08/17/2016 which showed a mass at 8 o'clock in the left breast measuring 0.8 x 0.9 cm.  She had a left breast biopsy on 08/18/2016 ([id number]) - IDC, High grade, ER - 90%, PR - 95%, Ki67 - 20%, and Her2Neu POSITIVE   Left breast lumpectomy  and left axillary SLNBx on 09/22/2016.  Pathology [phone]) - 1.3 cm IDC, broadly less than 0.1 cm from anterior margin (this is nipple), 0/1 nodes  Rad tx 01/27/2017 - 03/16/2017  On herceptin for one year - completed 10/04/2017.  Oncologist -  Gudena/Kinard  Plan:   1) Completed herceptin, ready for the power port to be removed.  Right IJ power port - 09/22/2016 - D. Bev Drennen   2) On anastrazole   3) Follow up in 6 months  2. Morbidly obese  BMI - 56.9 3. Stopped smoking in the 1980's  Alphonsa Overall, MD, [facility] Pager: [phone] Office phone:  [phone]

## 2017-12-21 ENCOUNTER — Ambulatory Visit: Admit: 2017-12-21 | Payer: No Typology Code available for payment source | Admitting: Surgery

## 2017-12-21 ENCOUNTER — Encounter (HOSPITAL_COMMUNITY)
Admission: RE | Disposition: A | Discharge status: Home / Self Care | Payer: Self-pay | Source: Ambulatory Visit | Attending: Surgery

## 2017-12-21 ENCOUNTER — Ambulatory Visit (HOSPITAL_COMMUNITY): Payer: No Typology Code available for payment source | Admitting: Anesthesiology

## 2017-12-21 ENCOUNTER — Encounter (HOSPITAL_COMMUNITY): Payer: Self-pay | Admitting: *Deleted

## 2017-12-21 ENCOUNTER — Ambulatory Visit (HOSPITAL_COMMUNITY)
Admission: RE | Admit: 2017-12-21 | Discharge: 2017-12-21 | Disposition: A | Discharge status: Home / Self Care | Payer: No Typology Code available for payment source | Source: Ambulatory Visit | Attending: Surgery | Admitting: Surgery

## 2017-12-21 DIAGNOSIS — Z79899 Other long term (current) drug therapy: Secondary | ICD-10-CM | POA: Diagnosis not present

## 2017-12-21 DIAGNOSIS — Z791 Long term (current) use of non-steroidal anti-inflammatories (NSAID): Secondary | ICD-10-CM | POA: Diagnosis not present

## 2017-12-21 DIAGNOSIS — Z9221 Personal history of antineoplastic chemotherapy: Secondary | ICD-10-CM | POA: Insufficient documentation

## 2017-12-21 DIAGNOSIS — Z87891 Personal history of nicotine dependence: Secondary | ICD-10-CM | POA: Insufficient documentation

## 2017-12-21 DIAGNOSIS — Z452 Encounter for adjustment and management of vascular access device: Secondary | ICD-10-CM | POA: Diagnosis not present

## 2017-12-21 DIAGNOSIS — Z853 Personal history of malignant neoplasm of breast: Secondary | ICD-10-CM | POA: Diagnosis not present

## 2017-12-21 DIAGNOSIS — Z6841 Body Mass Index (BMI) 40.0 and over, adult: Secondary | ICD-10-CM | POA: Insufficient documentation

## 2017-12-21 DIAGNOSIS — Z923 Personal history of irradiation: Secondary | ICD-10-CM | POA: Diagnosis not present

## 2017-12-21 DIAGNOSIS — Z79811 Long term (current) use of aromatase inhibitors: Secondary | ICD-10-CM | POA: Insufficient documentation

## 2017-12-21 HISTORY — PX: PORT-A-CATH REMOVAL: SHX5289

## 2017-12-21 LAB — CBC
HEMATOCRIT: 44.6 % (ref 36.0–46.0)
HEMOGLOBIN: 13.4 g/dL (ref 12.0–15.0)
MCH: 26.5 pg (ref 26.0–34.0)
MCHC: 30 g/dL (ref 30.0–36.0)
MCV: 88.1 fL (ref 78.0–100.0)
Platelets: 285 10*3/uL (ref 150–400)
RBC: 5.06 MIL/uL (ref 3.87–5.11)
RDW: 14.7 % (ref 11.5–15.5)
WBC: 8.4 10*3/uL (ref 4.0–10.5)

## 2017-12-21 LAB — BASIC METABOLIC PANEL
ANION GAP: 10 (ref 5–15)
BUN: 12 mg/dL (ref 8–23)
CHLORIDE: 105 mmol/L (ref 98–111)
CO2: 25 mmol/L (ref 22–32)
Calcium: 9.2 mg/dL (ref 8.9–10.3)
Creatinine, Ser: 0.74 mg/dL (ref 0.44–1.00)
GFR calc Af Amer: >60 mL/min (ref >60)
GLUCOSE: 238 mg/dL — AB (ref 70–99)
POTASSIUM: 4 mmol/L (ref 3.5–5.1)
Sodium: 140 mmol/L (ref 135–145)

## 2017-12-21 LAB — GLUCOSE, CAPILLARY: GLUCOSE-CAPILLARY: 238 mg/dL — AB (ref 70–99)

## 2017-12-21 LAB — HEMOGLOBIN A1C
Hgb A1c MFr Bld: 9.2 % — ABNORMAL HIGH (ref 4.8–5.6)
Mean Plasma Glucose: 217.34 mg/dL

## 2017-12-21 SURGERY — REMOVAL PORT-A-CATH
Anesthesia: Monitor Anesthesia Care

## 2017-12-21 SURGERY — REMOVAL PORT-A-CATH
Anesthesia: Monitor Anesthesia Care | Site: Chest

## 2017-12-21 MED ORDER — CHLORHEXIDINE GLUCONATE CLOTH 2 % EX PADS: 6.0000 | MEDICATED_PAD | Freq: Once | CUTANEOUS | Status: DC

## 2017-12-21 MED ORDER — 0.9 % SODIUM CHLORIDE (POUR BTL) OPTIME
TOPICAL | Status: DC | PRN
  Administered 2017-12-21: 1000 mL

## 2017-12-21 MED ORDER — LACTATED RINGERS IV SOLN
INTRAVENOUS | Status: DC
  Administered 2017-12-21: 13:00:00 via INTRAVENOUS

## 2017-12-21 MED ORDER — LIDOCAINE-EPINEPHRINE 1 %-1:100000 IJ SOLN
INTRAMUSCULAR | Status: DC | PRN
  Administered 2017-12-21: 16 mL

## 2017-12-21 MED ORDER — LIDOCAINE-EPINEPHRINE 1 %-1:100000 IJ SOLN
INTRAMUSCULAR | Status: AC
  Filled 2017-12-21: qty 1

## 2017-12-21 SURGICAL SUPPLY — 31 items
BLADE SURG 15 STRL LF DISP TIS (BLADE) ×1 IMPLANT
BLADE SURG 15 STRL SS (BLADE) ×2
CHLORAPREP W/TINT 10.5 ML (MISCELLANEOUS) ×3 IMPLANT
COVER SURGICAL LIGHT HANDLE (MISCELLANEOUS) ×3 IMPLANT
DECANTER SPIKE VIAL GLASS SM (MISCELLANEOUS) ×3 IMPLANT
DERMABOND ADVANCED (GAUZE/BANDAGES/DRESSINGS) ×2
DERMABOND ADVANCED .7 DNX12 (GAUZE/BANDAGES/DRESSINGS) ×1 IMPLANT
DRAPE LAPAROTOMY 100X72 PEDS (DRAPES) ×3 IMPLANT
DRAPE UTILITY XL STRL (DRAPES) ×3 IMPLANT
ELECT CAUTERY BLADE 6.4 (BLADE) ×3 IMPLANT
ELECT REM PT RETURN 9FT ADLT (ELECTROSURGICAL) ×3
ELECTRODE REM PT RTRN 9FT ADLT (ELECTROSURGICAL) ×1 IMPLANT
GAUZE SPONGE 4X4 16PLY XRAY LF (GAUZE/BANDAGES/DRESSINGS) ×3 IMPLANT
GLOVE SURG SIGNA 7.5 PF LTX (GLOVE) ×3 IMPLANT
GOWN STRL REUS W/ TWL LRG LVL3 (GOWN DISPOSABLE) IMPLANT
GOWN STRL REUS W/ TWL XL LVL3 (GOWN DISPOSABLE) ×2 IMPLANT
GOWN STRL REUS W/TWL LRG LVL3 (GOWN DISPOSABLE)
GOWN STRL REUS W/TWL XL LVL3 (GOWN DISPOSABLE) ×4
KIT BASIN OR (CUSTOM PROCEDURE TRAY) ×3 IMPLANT
KIT TURNOVER KIT B (KITS) ×3 IMPLANT
NEEDLE HYPO 25GX1X1/2 BEV (NEEDLE) ×3 IMPLANT
NS IRRIG 1000ML POUR BTL (IV SOLUTION) ×3 IMPLANT
PACK SURGICAL SETUP 50X90 (CUSTOM PROCEDURE TRAY) ×3 IMPLANT
PAD ARMBOARD 7.5X6 YLW CONV (MISCELLANEOUS) ×3 IMPLANT
PENCIL BUTTON HOLSTER BLD 10FT (ELECTRODE) ×3 IMPLANT
SUT MON AB 4-0 PC3 18 (SUTURE) ×3 IMPLANT
SUT VIC AB 3-0 SH 27 (SUTURE) ×4
SUT VIC AB 3-0 SH 27XBRD (SUTURE) ×2 IMPLANT
SYR CONTROL 10ML LL (SYRINGE) ×3 IMPLANT
TOWEL OR 17X24 6PK STRL BLUE (TOWEL DISPOSABLE) ×3 IMPLANT
TOWEL OR 17X26 10 PK STRL BLUE (TOWEL DISPOSABLE) ×3 IMPLANT

## 2017-12-21 NOTE — Anesthesia Preprocedure Evaluation (Addendum)
Anesthesia Evaluation  Patient identified by MRN, date of birth, ID band Patient awake    Reviewed: Allergy & Precautions, NPO status , Patient's Chart, lab work & pertinent test results  History of Anesthesia Complications Negative for: history of anesthetic complications  Airway Mallampati: II  TM Distance: >3 FB Neck ROM: full  Mouth opening: Limited Mouth Opening  Dental  (+) Teeth Intact, Dental Advidsory Given Very small mouth:   Pulmonary shortness of breath and with exertion, asthma , former smoker,    breath sounds clear to auscultation       Cardiovascular negative cardio ROS   Rhythm:regular  Echo 3/19 Study Conclusions  - Left ventricle: The cavity size was normal. Wall thickness was   normal. Systolic function was normal. The estimated ejection   fraction was in the range of 55% to 60%. Wall motion was normal;   there were no regional wall motion abnormalities. Doppler   parameters are consistent with abnormal left ventricular   relaxation (grade 1 diastolic dysfunction).   Neuro/Psych PSYCHIATRIC DISORDERS Depression negative neurological ROS     GI/Hepatic GERD  Medicated and Controlled,  Endo/Other  diabetesMorbid obesity  Renal/GU      Musculoskeletal  (+) Arthritis ,   Abdominal   Peds  Hematology   Anesthesia Other Findings   Reproductive/Obstetrics                           Anesthesia Physical  Anesthesia Plan  ASA: III  Anesthesia Plan: MAC   Post-op Pain Management:    Induction:   PONV Risk Score and Plan: 1 and Treatment may vary due to age or medical condition  Airway Management Planned: Nasal Cannula, Natural Airway and Mask  Additional Equipment: None  Intra-op Plan:   Post-operative Plan:   Informed Consent: I have reviewed the patients History and Physical, chart, labs and discussed the procedure including the risks, benefits and  alternatives for the proposed anesthesia with the patient or authorized representative who has indicated his/her understanding and acceptance.   Dental Advisory Given  Plan Discussed with: Surgeon, CRNA and Anesthesiologist  Anesthesia Plan Comments:         Anesthesia Quick Evaluation

## 2017-12-21 NOTE — Transfer of Care (Signed)
Immediate Anesthesia Transfer of Care Note  Patient: Sandra Wood  Procedure(s) Performed: REMOVAL PORT-A-CATH (N/A Chest)  Patient Location: PACU  Anesthesia Type:MAC  Level of Consciousness: awake, alert , oriented and patient cooperative  Airway & Oxygen Therapy: Patient Spontanous Breathing  Post-op Assessment: Report given to RN, Post -op Vital signs reviewed and stable and Patient moving all extremities  Post vital signs: Reviewed and stable  Last Vitals:  Vitals Value Taken Time  BP 133/70 12/21/2017  1:43 PM  Temp    Pulse 82 12/21/2017  1:43 PM  Resp 19 12/21/2017  1:43 PM  SpO2 96 % 12/21/2017  1:43 PM    Last Pain:  Vitals:   12/21/17 1020  TempSrc:   PainSc: 0-No pain         Complications: No apparent anesthesia complications

## 2017-12-21 NOTE — Anesthesia Postprocedure Evaluation (Signed)
Anesthesia Post Note  Patient: Sandra Wood  Procedure(s) Performed: REMOVAL PORT-A-CATH (N/A Chest)     Patient location during evaluation: PACU Anesthesia Type: MAC Level of consciousness: awake and alert Pain management: pain level controlled Vital Signs Assessment: post-procedure vital signs reviewed and stable Respiratory status: spontaneous breathing, nonlabored ventilation, respiratory function stable and patient connected to nasal cannula oxygen Cardiovascular status: stable and blood pressure returned to baseline Postop Assessment: no apparent nausea or vomiting Anesthetic complications: no    Last Vitals:  Vitals:   12/21/17 1007 12/21/17 1343  BP: (!) 157/65 133/70  Pulse: 84 82  Resp: 20 19  Temp: 36.6 C 36.8 C  SpO2: 94% 96%    Last Pain:  Vitals:   12/21/17 1343  TempSrc:   PainSc: 0-No pain                 Kaiyah Eber

## 2017-12-21 NOTE — Interval H&P Note (Signed)
History and Physical Interval Note:  12/21/2017 12:41 PM  Sandra Wood  has presented today for surgery, with the diagnosis of left breast cancer, power port  The various methods of treatment have been discussed with the patient and family.  Mother at the bedside.  After consideration of risks, benefits and other options for treatment, the patient has consented to  Procedure(s): REMOVAL PORT-A-CATH (N/A) as a surgical intervention .  The patient's history has been reviewed, patient examined, no change in status, stable for surgery.  I have reviewed the patient's chart and labs.  Questions were answered to the patient's satisfaction.     Shann Medal

## 2017-12-21 NOTE — Anesthesia Procedure Notes (Signed)
Procedure Name: MAC Date/Time: 12/21/2017 1:15 PM Performed by: Izora Gala, CRNA Pre-anesthesia Checklist: Patient identified, Emergency Drugs available, Suction available and Patient being monitored Preoxygenation: No O2 administered. Post-procedure assessment: CO2 not monitored.

## 2017-12-21 NOTE — Op Note (Signed)
12/21/2017  1:39 PM  PATIENT:  Sandra Wood, 62 y.o., female MRN: [id number] DOB: [birth year]  PREOP DIAGNOSIS:  PORT A CATH, completion of chemotherapy  POSTOP DIAGNOSIS:   Power port, right IJ, completion of chemotherapy  PROCEDURE:   Procedure(s): REMOVAL PORT-A-CATH  SURGEON:   Alphonsa Overall, M.D.  ANESTHESIA:  16 cc of 1%  xylocaine  COUNTS CORRECT:  YES  INDICATIONS FOR PROCEDURE:  LAWRENCE ROLDAN is a 62 y.o. (DOB: 1956/05/02) white female whose primary care physician is Janith Lima, MD and comes for removal of a power port that was placed for the treatment of left breast cancer cancer.  Dr. Lindi Adie is her treating oncologist.  She has now completed her chemotherapy.   The indications and risks of the surgery were explained to the patient.  The risks include, but are not limited to, infection, bleeding, and nerve injury.  OPERATIVE NOTE:  The patient was taken to OR room #2 at [facility].  Her right chest was prepped with chloroprep and sterilely draped.   A time out was held and the surgery checklist reviewed.   The patient was placed in a supine position.  I infiltrated 16 cc of local around the power port.   I made an incision through the prior power port incision.   I cut down to the tubing and removed this intact.  I then removed the port intact.  There was no bleeding.   The wounds were then closed with 3-0 vicryl subcutaneous sutures and the skin closed with a 4-0 Monocryl suture.  The skin was painted with Dermabond.   The patient was transferred to the recovery room in good condition.  The sponge and needle count were correct at the end of the case.   Alphonsa Overall, MD, [facility] Pager: [phone] Office phone:  [phone]

## 2017-12-21 NOTE — Discharge Instructions (Signed)
[facility] - DISCHARGE INSTRUCTIONS TO PATIENT  Activity:  Driving - May drive  Wound Care:   Leave incision dry for 24 hours, then you may shower.  Diet:  As tolerated.  Follow up appointment:  Call Dr. Pollie Friar office [facility]) at [phone] for an appointment in 4 months (October).   This is 6 months from the last visit with Dr. Lucia Gaskins.  Medications and dosages:  Resume your home medications.  Call Dr. Lucia Gaskins or his office  [phone]) if you have:  Temperature greater than 100.4,  Persistent nausea and vomiting,  Severe uncontrolled pain,  Redness, tenderness, or signs of infection (pain, swelling, redness, odor or green/yellow discharge around the site),  Difficulty breathing, headache or visual disturbances,  Any other questions or concerns you may have after discharge.  In an emergency, call 911 or go to an Emergency Department at a nearby hospital.

## 2017-12-23 ENCOUNTER — Encounter (HOSPITAL_COMMUNITY): Payer: Self-pay | Admitting: Surgery

## 2018-04-04 NOTE — Assessment & Plan Note (Signed)
Left breast biopsy02/20/2018:8:00 retroareolar position: IDC, high-grade with DCIS high-grade, ER 90%, PR 95%, Ki-67 20%, HER-2 positive ratio 3.4; screening detected left breast distortion LIQ 8 8:00 retroareolar 0.8 cm, axilla negative, T1b N0 stage IA clinical stage Left lumpectomy 09/22/2016: IDC grade 3, 1.3 cm, DCIS high-grade, margins clear, 0/1 lymph node negative, T1 CN 0 stage IA, ER 90%, PR 95%, Ki-67 20%, HER-2 positive ratio 3.4  Treatment summary: 1. Adjuvant chemotherapy and Herceptin with weekly Taxol and Herceptin 12 completed 01/04/2017 followed by Herceptin maintenance for 1 year completed 10/04/2017 3.Adjuvant radiation8/06/2016-03/16/2017 4. Adjuvant antiestrogen therapy with letrozole 2.5 mg daily 5 yearsstarted 05/10/2017 ----------------------------------------------------------------------------- Diabetes: No change in the current plan  Anastrozoletoxicities: Intermittent hot flashes Denies any arthralgias or myalgias  I briefly discussed with her about the role of Neratinib

## 2018-04-05 ENCOUNTER — Inpatient Hospital Stay
Payer: No Typology Code available for payment source | Attending: Hematology and Oncology | Admitting: Hematology and Oncology

## 2018-04-05 DIAGNOSIS — Z923 Personal history of irradiation: Secondary | ICD-10-CM | POA: Diagnosis not present

## 2018-04-05 DIAGNOSIS — Z79899 Other long term (current) drug therapy: Secondary | ICD-10-CM | POA: Insufficient documentation

## 2018-04-05 DIAGNOSIS — Z170 Estrogen receptor positive status [ER+]: Secondary | ICD-10-CM | POA: Insufficient documentation

## 2018-04-05 DIAGNOSIS — C50312 Malignant neoplasm of lower-inner quadrant of left female breast: Secondary | ICD-10-CM | POA: Insufficient documentation

## 2018-04-05 DIAGNOSIS — Z9221 Personal history of antineoplastic chemotherapy: Secondary | ICD-10-CM | POA: Diagnosis not present

## 2018-04-05 DIAGNOSIS — Z79811 Long term (current) use of aromatase inhibitors: Secondary | ICD-10-CM | POA: Diagnosis not present

## 2018-04-05 MED ORDER — ANASTROZOLE 1 MG PO TABS: 1.0000 mg | ORAL_TABLET | Freq: Every day | ORAL | 3 refills | Status: DC

## 2018-04-05 NOTE — Progress Notes (Signed)
Patient Care Team: Janith Lima, MD as PCP - General (Internal Medicine) Alphonsa Overall, MD as Consulting Physician (General Surgery) Nicholas Lose, MD as Consulting Physician (Hematology and Oncology) Eppie Gibson, MD as Attending Physician (Radiation Oncology)  DIAGNOSIS:  Encounter Diagnosis  Name Primary?  . Malignant neoplasm of lower-inner quadrant of left breast in female, estrogen receptor positive ([place])     SUMMARY OF ONCOLOGIC HISTORY:   Malignant neoplasm of lower-inner quadrant of left breast in female, estrogen receptor positive ([place])   08/18/2016 Initial Diagnosis    Left breast biopsy 8:00 retroareolar position: IDC, high-grade with DCIS high-grade, ER 90%, PR 95%, Ki-67 20%, HER-2 positive ratio 3.4; screening detected left breast distortion LIQ 8 8:00 retroareolar 0.8 cm, axilla negative, T1b N0 stage IA clinical stage    09/22/2016 Surgery    Left lumpectomy: IDC grade 3, 1.3 cm, DCIS high-grade, margins clear, 0/1 lymph node negative, T1CN 0 stage IA, ER 90%, PR 95%, Ki-67 20%, HER-2 positive ratio 3.4    10/19/2016 - 01/04/2017 Chemotherapy    Taxol Herceptin weekly 12 followed by Herceptin maintenance every 3 weeks for 1 year     01/27/2017 - 03/16/2017 Radiation Therapy    Adjuvant radiation therapy    05/10/2017 -  Anti-estrogen oral therapy    Anastrozole 1 mg p.o. daily     CHIEF COMPLIANT: Follow-up on anastrozole therapy  INTERVAL HISTORY: Sandra Wood is a 62 year old with above-mentioned history of left breast cancer treated with lumpectomy followed by adjuvant chemotherapy radiation is currently on anastrozole.  She is tolerating it extremely well.  She does not have any more hot flashes.  She felt a small knot in the right breast 4 o'clock position below the scar for the port.  Apart from this she is doing quite well.  REVIEW OF SYSTEMS:   Constitutional: Denies fevers, chills or abnormal weight loss Eyes: Denies blurriness of vision Ears,  nose, mouth, throat, and face: Denies mucositis or sore throat Respiratory: Denies cough, dyspnea or wheezes Cardiovascular: Denies palpitation, chest discomfort Gastrointestinal:  Denies nausea, heartburn or change in bowel habits Skin: Denies abnormal skin rashes Lymphatics: Denies new lymphadenopathy or easy bruising Neurological:Denies numbness, tingling or new weaknesses Behavioral/Psych: Mood is stable, no new changes  Extremities: No lower extremity edema Breast: Right breast 12 o'clock position not palpable nodule All other systems were reviewed with the patient and are negative.  I have reviewed the past medical history, past surgical history, social history and family history with the patient and they are unchanged from previous note.  ALLERGIES:  is allergic to no known allergies.  MEDICATIONS:  Current Outpatient Medications  Medication Sig Dispense Refill  . anastrozole (ARIMIDEX) 1 MG tablet Take 1 tablet (1 mg total) by mouth daily. 90 tablet 3  . blood glucose meter kit and supplies KIT Dispense based on patient and insurance preference. Use up to four times daily as directed. (FOR ICD-9 250.00, 250.01). Check blood sugar BID. 1 each 0  . glucose blood test strip Use as instructed 100 each 12  . ibuprofen (ADVIL,MOTRIN) 200 MG tablet Take 400-600 mg by mouth every 6 (six) hours as needed for headache or mild pain (depends on pain if takes 2-3 tablets).     Marland Kitchen ipratropium-albuterol (DUONEB) 0.5-2.5 (3) MG/3ML SOLN Inhale 3 mLs into the lungs every 4 (four) hours as needed (shortness of breath).     . loratadine (CLARITIN) 10 MG tablet Take 10 mg by mouth daily as needed for  allergies.    Marland Kitchen losartan (COZAAR) 50 MG tablet Take 1 tablet (50 mg total) by mouth daily. 90 tablet 3  . Multiple Vitamins-Minerals (MULTIVITAMIN ADULT) CHEW Chew 2 each by mouth daily.    Marland Kitchen PROAIR HFA 108 (90 Base) MCG/ACT inhaler Inhale 2 puffs into the lungs every 6 (six) hours as needed for wheezing  or shortness of breath.      No current facility-administered medications for this visit.     PHYSICAL EXAMINATION: ECOG PERFORMANCE STATUS: 1 - Symptomatic but completely ambulatory  Vitals:   04/05/18 0812  BP: (!) 145/82  Pulse: (!) 101  Resp: 17  Temp: 98.7 F (37.1 C)  SpO2: 93%   Filed Weights   04/05/18 0812  Weight: (!) 317 lb 12.8 oz (144.2 kg)    GENERAL:alert, no distress and comfortable SKIN: skin color, texture, turgor are normal, no rashes or significant lesions EYES: normal, Conjunctiva are pink and non-injected, sclera clear OROPHARYNX:no exudate, no erythema and lips, buccal mucosa, and tongue normal  NECK: supple, thyroid normal size, non-tender, without nodularity LYMPH:  no palpable lymphadenopathy in the cervical, axillary or inguinal LUNGS: clear to auscultation and percussion with normal breathing effort HEART: regular rate & rhythm and no murmurs and no lower extremity edema ABDOMEN:abdomen soft, non-tender and normal bowel sounds MUSCULOSKELETAL:no cyanosis of digits and no clubbing  NEURO: alert & oriented x 3 with fluent speech, no focal motor/sensory deficits EXTREMITIES: No lower extremity edema BREAST: Right breast palpable nodule 12:00 position. No palpable axillary supraclavicular or infraclavicular adenopathy no breast tenderness or nipple discharge. (exam performed in the presence of a chaperone)  LABORATORY DATA:  I have reviewed the data as listed CMP Latest Ref Rng & Units 12/21/2017 10/04/2017 08/23/2017  Glucose 70 - 99 mg/dL 238(H) 167(H) 169(H)  BUN 8 - 23 mg/dL _0 Creatinine 0.44 - 1.00 mg/dL 0.74 0.75 0.76  Sodium 135 - 145 mmol/L 140 140 138  Potassium 3.5 - 5.1 mmol/L 4.0 3.9 4.1  Chloride 98 - 111 mmol/L 105 103 103  CO2 22 - 32 mmol/L _1 Calcium 8.9 - 10.3 mg/dL 9.2 9.8 9.5  Total Protein 6.4 - 8.3 g/dL - 7.6 7.6  Total Bilirubin 0.2 - 1.2 mg/dL - 0.5 0.4  Alkaline Phos 40 - 150 U/L - 150 148  AST 5 - 34 U/L -  13 12  ALT 0 - 55 U/L - 19 17    Lab Results  Component Value Date   WBC 8.4 12/21/2017   HGB 13.4 12/21/2017   HCT 44.6 12/21/2017   MCV 88.1 12/21/2017   PLT 285 12/21/2017   NEUTROABS 7.2 (H) 10/04/2017    ASSESSMENT & PLAN:  Malignant neoplasm of lower-inner quadrant of left breast in female, estrogen receptor positive ([facility]) Left breast biopsy02/20/2018:8:00 retroareolar position: IDC, high-grade with DCIS high-grade, ER 90%, PR 95%, Ki-67 20%, HER-2 positive ratio 3.4; screening detected left breast distortion LIQ 8 8:00 retroareolar 0.8 cm, axilla negative, T1b N0 stage IA clinical stage Left lumpectomy 09/22/2016: IDC grade 3, 1.3 cm, DCIS high-grade, margins clear, 0/1 lymph node negative, T1 CN 0 stage IA, ER 90%, PR 95%, Ki-67 20%, HER-2 positive ratio 3.4  Treatment summary: 1. Adjuvant chemotherapy and Herceptin with weekly Taxol and Herceptin 12 completed 01/04/2017 followed by Herceptin maintenance for 1 year completed 10/04/2017 3.Adjuvant radiation8/06/2016-03/16/2017 4. Adjuvant antiestrogen therapy with letrozole 2.5 mg daily 5 yearsstarted 05/10/2017 ----------------------------------------------------------------------------- Diabetes: No change in the current plan  Anastrozoletoxicities:  Intermittent hot flashes Denies any arthralgias or myalgias  I briefly discussed with her about the role of Neratinib Patient is however not interested in it because of the diarrhea concern.  Breast cancer surveillance: 1.  Mammogram February 2019: Benign 2. breast exam 04/05/2018: Small palpable nodule at 12 o'clock position below the scar for the port on the right side.  We will obtain a mammogram and ultrasound of the right breast.  Return to clinic in 1 year for follow-up.  I sent a prescription refill for anastrozole.  Orders Placed This Encounter  Procedures  . US BREAST LTD UNI RIGHT INC AXILLA    Standing Status:   Future    Standing Expiration Date:    06/06/2019    Order Specific Question:   Reason for Exam (SYMPTOM  OR DIAGNOSIS REQUIRED)    Answer:   Palpable know at 63 o clock position right breast below port scar    Order Specific Question:   Preferred imaging location?    Answer:   [facility]  . MM DIAG BREAST TOMO UNI RIGHT    Standing Status:   Future    Standing Expiration Date:   04/06/2019    Order Specific Question:   Reason for Exam (SYMPTOM  OR DIAGNOSIS REQUIRED)    Answer:   palpable mass right breast    Order Specific Question:   Preferred imaging location?    Answer:   [facility]   The patient has a good understanding of the overall plan. she agrees with it. she will call with any problems that may develop before the next visit here.   Harriette Ohara, MD 04/05/18

## 2018-04-06 ENCOUNTER — Telehealth: Payer: Self-pay | Admitting: Hematology and Oncology

## 2018-04-06 NOTE — Telephone Encounter (Signed)
Mailed pt calendar  °

## 2018-04-08 ENCOUNTER — Ambulatory Visit
Admission: RE | Admit: 2018-04-08 | Discharge: 2018-04-08 | Disposition: A | Discharge status: Home / Self Care | Payer: No Typology Code available for payment source | Source: Ambulatory Visit | Attending: Hematology and Oncology | Admitting: Hematology and Oncology

## 2018-04-08 DIAGNOSIS — C50312 Malignant neoplasm of lower-inner quadrant of left female breast: Secondary | ICD-10-CM

## 2018-04-08 DIAGNOSIS — Z170 Estrogen receptor positive status [ER+]: Principal | ICD-10-CM

## 2018-05-18 ENCOUNTER — Other Ambulatory Visit (HOSPITAL_COMMUNITY): Payer: Self-pay | Admitting: Cardiology

## 2018-06-08 IMAGING — MR MR BILATERAL BREAST WITHOUT AND WITH CONTRAST
8 of 12 series · 33 of 48 positions shown · IV contrast (multihance)
Comparison: Previous exam(s).

CLINICAL DATA: Known left breast cancer. Evaluate extent of
disease.

LABS:  None
EXAM:
BILATERAL BREAST MRI WITH AND WITHOUT CONTRAST
TECHNIQUE: Multiplanar, multisequence MR images of both breasts were obtained
prior to and following the intravenous administration of 20 ml of
MultiHance.

[Series 2: t2_tirm_tra ipat (a-p) · axial · 3.0mm · 0.74mm/px · 1 of 62 slices shown]
[im 1/62]
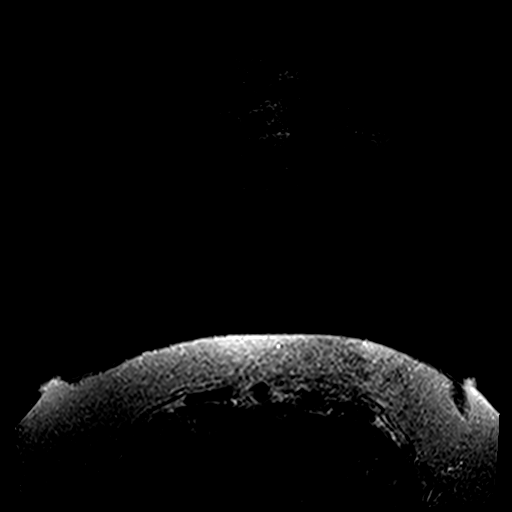

[Series 3: fl3d pre-cm no · axial · non-contrast · 1.2mm · 0.99mm/px · z∈[-68,+104]mm · 5 of 143 slices shown]
[im 1/143]
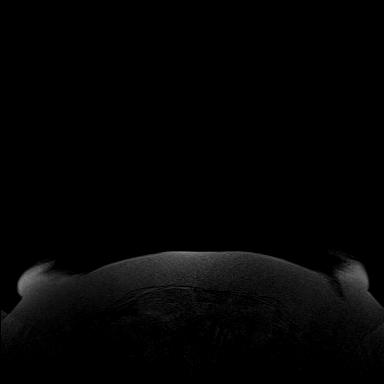
[im 36/143]
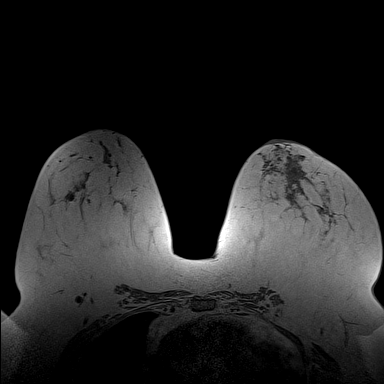
[im 72/143]
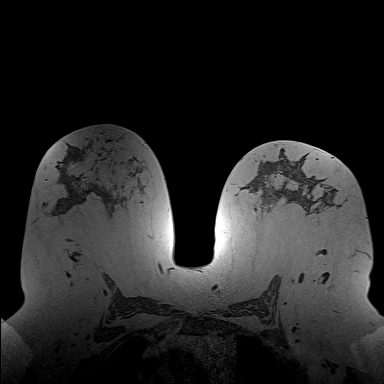
[im 107/143]
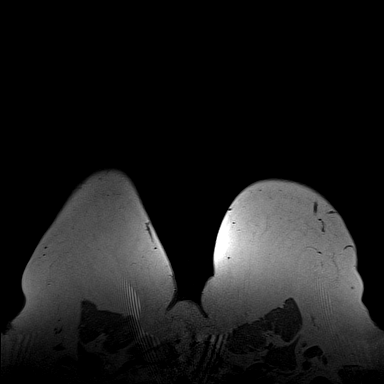
[im 143/143]
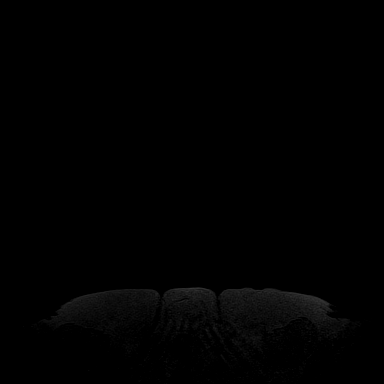

[Series 4: fl3d pre-cm · axial · non-contrast · 1.2mm · 0.99mm/px · z∈[-68,+104]mm · 5 of 144 slices shown]
[im 1/144]
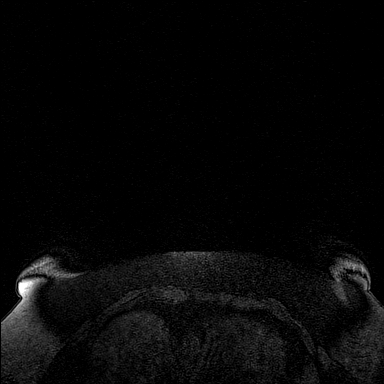
[im 36/144]
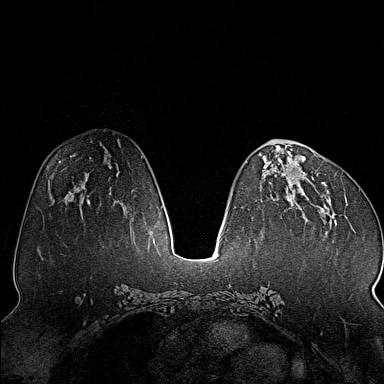
[im 72/144]
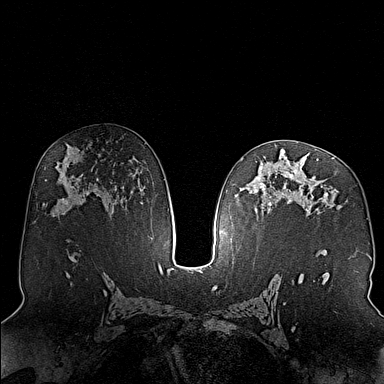
[im 108/144]
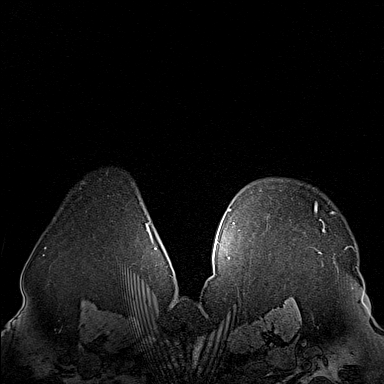
[im 144/144]
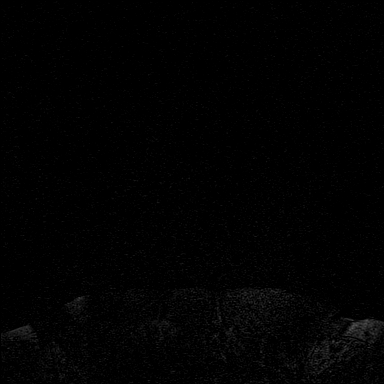

[Series 5: fl3d post-cm 20 · axial · 1.2mm · 0.99mm/px · z∈[-68,+104]mm · 5 of 144 slices shown (1 of 3)]
[im 1/144]
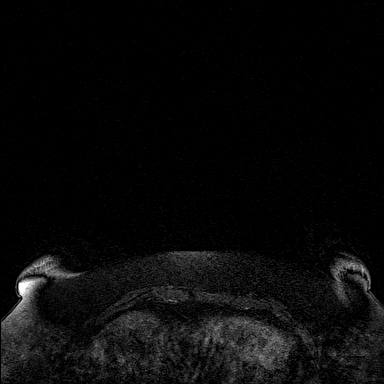
[im 36/144]
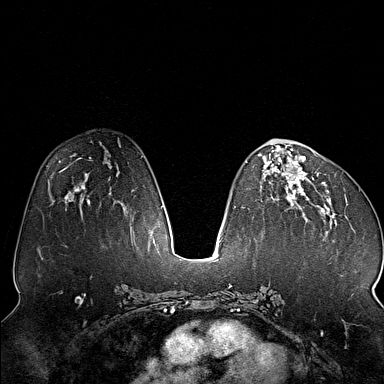
[im 72/144]
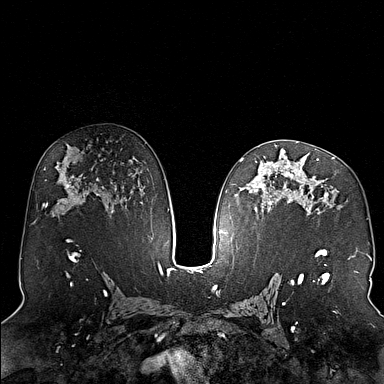
[im 108/144]
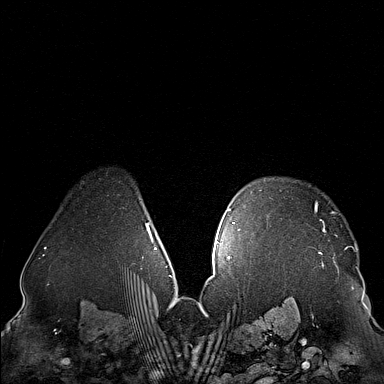
[im 144/144]
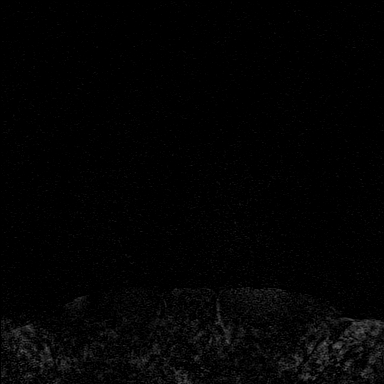

[Series 6: fl3d post-cm 20 · axial · 1.2mm · 0.99mm/px · z∈[-68,+104]mm · 5 of 144 slices shown (2 of 3)]
[im 1/144]
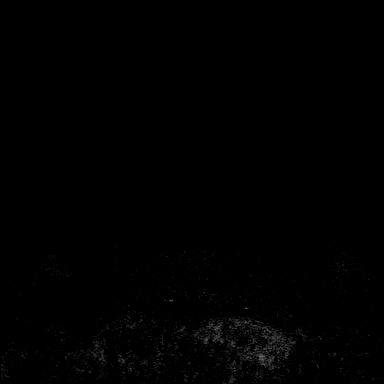
[im 36/144]
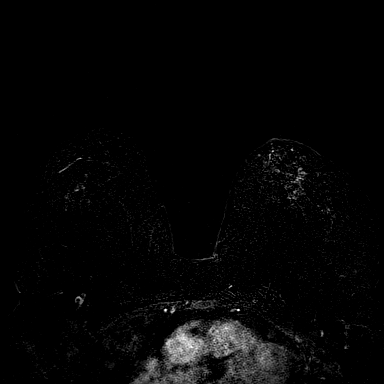
[im 72/144]
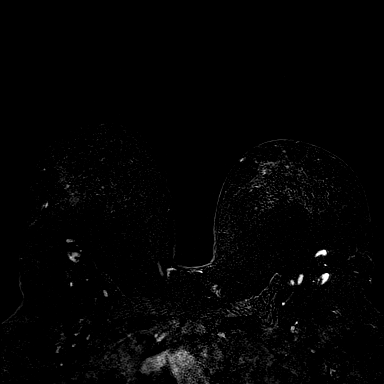
[im 108/144]
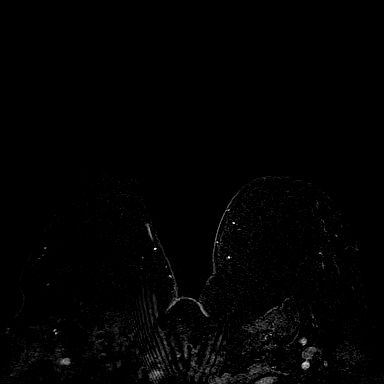
[im 144/144]
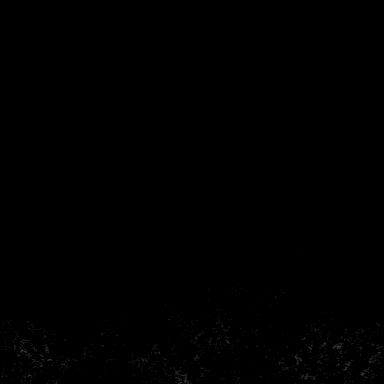

[Series 7: fl3d post-cm 20 · axial · 172.8mm · 0.99mm/px · 1 of 1 slices shown (3 of 3)]
[im 1/1]
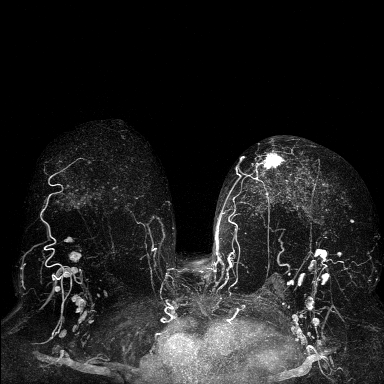

[Series 8: fl3d post-cm 3min · axial · 1.2mm · 0.99mm/px · z∈[-68,+104]mm · 6 of 144 slices shown]
[im 1/144]
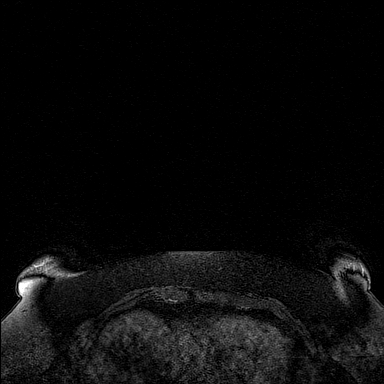
[im 29/144]
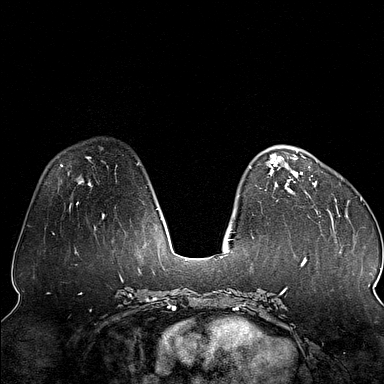
[im 58/144]
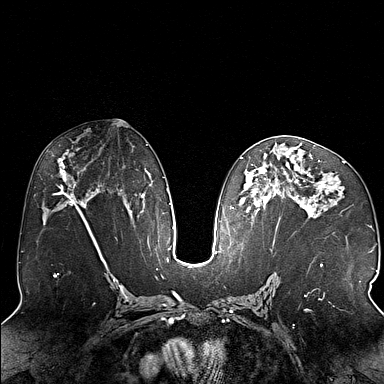
[im 86/144]
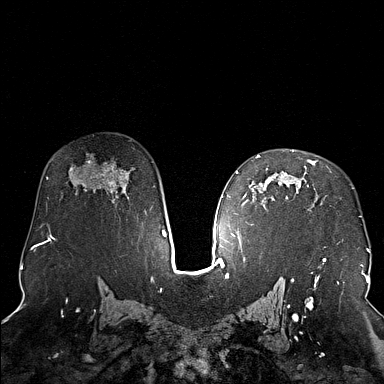
[im 115/144]
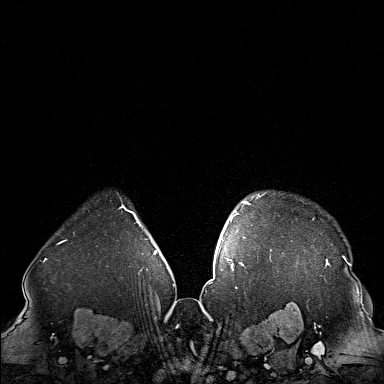
[im 144/144]
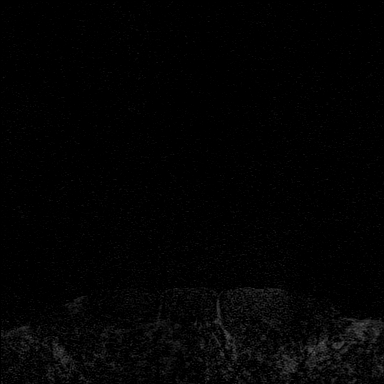

[Series 9: fl3d post-cm 3min_sub · axial · 1.2mm · 0.99mm/px · z∈[-68,+69]mm · 5 of 144 slices shown]
[im 1/144]
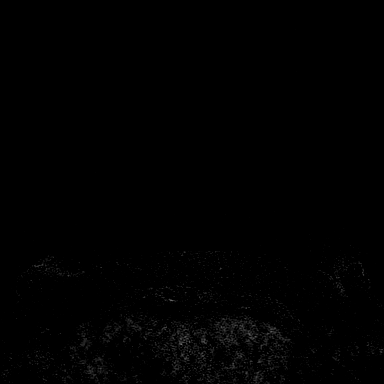
[im 29/144]
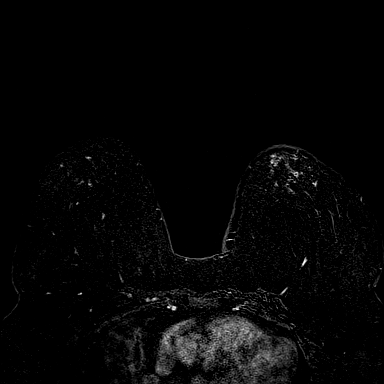
[im 58/144]
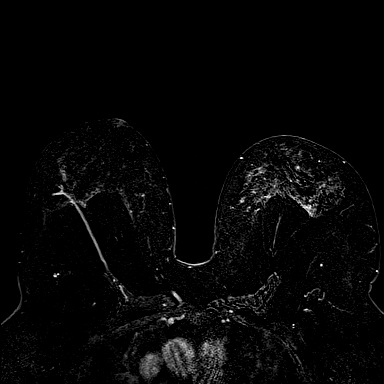
[im 86/144]
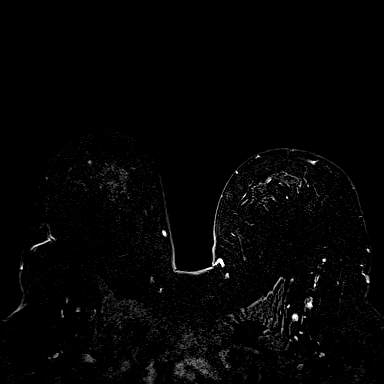
[im 115/144]
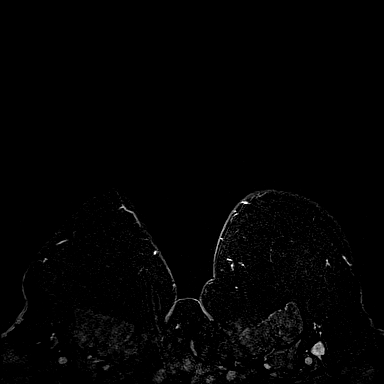

[33 of 48 positions shown; findings below may reference images not displayed]

THREE-DIMENSIONAL MR IMAGE RENDERING ON INDEPENDENT WORKSTATION:

Three-dimensional MR images were rendered by post-processing of the
original MR data on an independent workstation. The
three-dimensional MR images were interpreted, and findings are
reported in the following complete MRI report for this study. Three
dimensional images were evaluated at the independent [employer]
workstation
FINDINGS: Breast composition: c. Heterogeneous fibroglandular tissue.

Background parenchymal enhancement: Moderate.

Right breast: No mass or abnormal enhancement.

Left breast: The patient's known malignancy is identified in the
anterior left breast superior and slightly medial to the nipple
measuring up to 2 cm on series 6, image 99. No other suspicious
findings seen in the left breast. Known longstanding stable
intramammary lymph nodes identified.

Lymph nodes: No abnormal appearing lymph nodes.

Ancillary findings:  None.
IMPRESSION: Known 2 cm malignancy in the left breast as above. No other MRI
detectable malignancy.

RECOMMENDATION:
Continued surgical and oncologic consultation.

BI-RADS CATEGORY  6: Known biopsy-proven malignancy.

## 2018-09-06 ENCOUNTER — Other Ambulatory Visit: Payer: Self-pay | Admitting: Hematology and Oncology

## 2018-09-06 DIAGNOSIS — Z853 Personal history of malignant neoplasm of breast: Secondary | ICD-10-CM

## 2018-09-08 ENCOUNTER — Telehealth: Payer: Self-pay | Admitting: Internal Medicine

## 2018-09-08 NOTE — Telephone Encounter (Signed)
error 

## 2018-09-12 ENCOUNTER — Telehealth: Payer: Self-pay | Admitting: Obstetrics and Gynecology

## 2018-09-12 NOTE — Telephone Encounter (Signed)
Patient is asking for an appointment with Dr.Silva  for a rash "on her bottom".

## 2018-09-12 NOTE — Telephone Encounter (Signed)
Patient states she is returning a call to Jill. No open phone note? 

## 2018-09-12 NOTE — Telephone Encounter (Signed)
Spoke with patient, requesting OV for 3/17 at 0900 with Dr. Quincy Simmonds. OV rescheduled. Patient verbalizes understanding.  Routing to provider for final review. Patient is agreeable to disposition. Will close encounter.

## 2018-09-12 NOTE — Telephone Encounter (Signed)
Spoke with patient. Reports redness and irritation of vulva for approximately 2wks. Unable to visulize the area. Denies itching, odor, vaginal d/c, bleeding, fever/chills. Has tried applying neosporin, tucks pads, and Vaseline with no relief. Requesting OV.   OV scheduled for 3/19 at 0800 with Dr. Quincy Simmonds. Patient declined OV offered for 3/17 at 0900.   Routing to provider for final review. Patient is agreeable to disposition. Will close encounter.

## 2018-09-13 ENCOUNTER — Other Ambulatory Visit (INDEPENDENT_AMBULATORY_CARE_PROVIDER_SITE_OTHER): Payer: No Typology Code available for payment source

## 2018-09-13 ENCOUNTER — Encounter: Payer: Self-pay | Admitting: Internal Medicine

## 2018-09-13 ENCOUNTER — Ambulatory Visit: Payer: No Typology Code available for payment source | Admitting: Internal Medicine

## 2018-09-13 ENCOUNTER — Ambulatory Visit: Payer: No Typology Code available for payment source | Admitting: Obstetrics and Gynecology

## 2018-09-13 ENCOUNTER — Other Ambulatory Visit: Payer: Self-pay

## 2018-09-13 VITALS — BP 132/72 | HR 88 | Resp 18 | Ht 63.0 in | Wt 304.5 lb

## 2018-09-13 VITALS — BP 132/84 | HR 90 | Temp 98.0°F | Resp 16 | Ht 63.0 in | Wt 302.0 lb

## 2018-09-13 DIAGNOSIS — IMO0002 Reserved for concepts with insufficient information to code with codable children: Secondary | ICD-10-CM

## 2018-09-13 DIAGNOSIS — R8271 Bacteriuria: Secondary | ICD-10-CM | POA: Diagnosis not present

## 2018-09-13 DIAGNOSIS — E1165 Type 2 diabetes mellitus with hyperglycemia: Secondary | ICD-10-CM | POA: Diagnosis not present

## 2018-09-13 DIAGNOSIS — Z0000 Encounter for general adult medical examination without abnormal findings: Secondary | ICD-10-CM | POA: Diagnosis not present

## 2018-09-13 DIAGNOSIS — E118 Type 2 diabetes mellitus with unspecified complications: Secondary | ICD-10-CM

## 2018-09-13 DIAGNOSIS — R200 Anesthesia of skin: Secondary | ICD-10-CM | POA: Diagnosis not present

## 2018-09-13 DIAGNOSIS — R202 Paresthesia of skin: Secondary | ICD-10-CM | POA: Diagnosis not present

## 2018-09-13 DIAGNOSIS — E538 Deficiency of other specified B group vitamins: Secondary | ICD-10-CM | POA: Diagnosis not present

## 2018-09-13 DIAGNOSIS — Z23 Encounter for immunization: Secondary | ICD-10-CM | POA: Diagnosis not present

## 2018-09-13 DIAGNOSIS — Z1159 Encounter for screening for other viral diseases: Secondary | ICD-10-CM | POA: Insufficient documentation

## 2018-09-13 DIAGNOSIS — R7309 Other abnormal glucose: Secondary | ICD-10-CM

## 2018-09-13 DIAGNOSIS — E785 Hyperlipidemia, unspecified: Secondary | ICD-10-CM | POA: Diagnosis not present

## 2018-09-13 DIAGNOSIS — N760 Acute vaginitis: Secondary | ICD-10-CM | POA: Diagnosis not present

## 2018-09-13 DIAGNOSIS — Z1211 Encounter for screening for malignant neoplasm of colon: Secondary | ICD-10-CM | POA: Insufficient documentation

## 2018-09-13 DIAGNOSIS — I10 Essential (primary) hypertension: Secondary | ICD-10-CM

## 2018-09-13 DIAGNOSIS — E1142 Type 2 diabetes mellitus with diabetic polyneuropathy: Secondary | ICD-10-CM

## 2018-09-13 DIAGNOSIS — R8281 Pyuria: Secondary | ICD-10-CM

## 2018-09-13 DIAGNOSIS — G63 Polyneuropathy in diseases classified elsewhere: Secondary | ICD-10-CM

## 2018-09-13 DIAGNOSIS — Z0001 Encounter for general adult medical examination with abnormal findings: Secondary | ICD-10-CM

## 2018-09-13 LAB — COMPREHENSIVE METABOLIC PANEL
ALK PHOS: 160 U/L — AB (ref 39–117)
ALT: 17 U/L (ref 0–35)
AST: 14 U/L (ref 0–37)
Albumin: 3.9 g/dL (ref 3.5–5.2)
BILIRUBIN TOTAL: 0.5 mg/dL (ref 0.2–1.2)
BUN: 10 mg/dL (ref 6–23)
CO2: 25 meq/L (ref 19–32)
Calcium: 9.6 mg/dL (ref 8.4–10.5)
Chloride: 102 mEq/L (ref 96–112)
Creatinine, Ser: 0.8 mg/dL (ref 0.40–1.20)
GFR: 72.22 mL/min (ref >60.00)
Glucose, Bld: 385 mg/dL — ABNORMAL HIGH (ref 70–99)
Potassium: 4.2 mEq/L (ref 3.5–5.1)
Sodium: 137 mEq/L (ref 135–145)
TOTAL PROTEIN: 8.2 g/dL (ref 6.0–8.3)

## 2018-09-13 LAB — POCT GLYCOSYLATED HEMOGLOBIN (HGB A1C): Hemoglobin A1C: 12.5 % — AB (ref 4.0–5.6)

## 2018-09-13 LAB — MICROALBUMIN / CREATININE URINE RATIO
CREATININE, U: 89.2 mg/dL
MICROALB UR: 2.1 mg/dL — AB (ref 0.0–1.9)
Microalb Creat Ratio: 2.3 mg/g (ref 0.0–30.0)

## 2018-09-13 LAB — LIPID PANEL
Cholesterol: 237 mg/dL — ABNORMAL HIGH (ref 0–200)
HDL: 46.3 mg/dL (ref >39.00)
NonHDL: 190.85
Total CHOL/HDL Ratio: 5
Triglycerides: 238 mg/dL — ABNORMAL HIGH (ref 0.0–149.0)
VLDL: 47.6 mg/dL — ABNORMAL HIGH (ref 0.0–40.0)

## 2018-09-13 LAB — LDL CHOLESTEROL, DIRECT: Direct LDL: 177 mg/dL

## 2018-09-13 LAB — CBC WITH DIFFERENTIAL/PLATELET
BASOS ABS: 0.1 10*3/uL (ref 0.0–0.1)
BASOS PCT: 0.6 % (ref 0.0–3.0)
Eosinophils Absolute: 0.4 10*3/uL (ref 0.0–0.7)
Eosinophils Relative: 3.1 % (ref 0.0–5.0)
HEMATOCRIT: 48.1 % — AB (ref 36.0–46.0)
Hemoglobin: 15.4 g/dL — ABNORMAL HIGH (ref 12.0–15.0)
LYMPHS PCT: 18.8 % (ref 12.0–46.0)
Lymphs Abs: 2.4 10*3/uL (ref 0.7–4.0)
MCHC: 32 g/dL (ref 30.0–36.0)
MCV: 86.6 fl (ref 78.0–100.0)
Monocytes Absolute: 0.9 10*3/uL (ref 0.1–1.0)
Monocytes Relative: 7.3 % (ref 3.0–12.0)
NEUTROS ABS: 9.1 10*3/uL — AB (ref 1.4–7.7)
Neutrophils Relative %: 70.2 % (ref 43.0–77.0)
PLATELETS: 359 10*3/uL (ref 150.0–400.0)
RBC: 5.56 Mil/uL — ABNORMAL HIGH (ref 3.87–5.11)
RDW: 15.4 % (ref 11.5–15.5)
WBC: 13 10*3/uL — ABNORMAL HIGH (ref 4.0–10.5)

## 2018-09-13 LAB — POCT GLUCOSE (DEVICE FOR HOME USE): GLUCOSE FASTING, POC: 350 mg/dL — AB (ref 70–99)

## 2018-09-13 LAB — TSH: TSH: 3.15 u[IU]/mL (ref 0.35–4.50)

## 2018-09-13 MED ORDER — FREESTYLE LIBRE 14 DAY READER DEVI: 1.0000 | Freq: Every day | 11 refills | Status: DC

## 2018-09-13 MED ORDER — INSULIN PEN NEEDLE 32G X 6 MM MISC: 1.0000 | Freq: Every day | 3 refills | Status: DC

## 2018-09-13 MED ORDER — NYSTATIN-TRIAMCINOLONE 100000-0.1 UNIT/GM-% EX CREA: 1.0000 "application " | TOPICAL_CREAM | Freq: Two times a day (BID) | CUTANEOUS | 0 refills | Status: DC

## 2018-09-13 MED ORDER — SEMAGLUTIDE 7 MG PO TABS: 1.0000 | ORAL_TABLET | Freq: Every day | ORAL | 1 refills | Status: DC

## 2018-09-13 MED ORDER — FREESTYLE LIBRE 14 DAY SENSOR MISC: 1.0000 | Freq: Every day | 11 refills | Status: DC

## 2018-09-13 MED ORDER — INSULIN DEGLUDEC 200 UNIT/ML ~~LOC~~ SOPN: 60.0000 [IU] | PEN_INJECTOR | Freq: Every day | SUBCUTANEOUS | 1 refills | Status: DC

## 2018-09-13 MED ORDER — FLUCONAZOLE 150 MG PO TABS: ORAL_TABLET | ORAL | 0 refills | Status: DC

## 2018-09-13 MED ORDER — LOSARTAN POTASSIUM 50 MG PO TABS: 50.0000 mg | ORAL_TABLET | Freq: Every day | ORAL | 1 refills | Status: DC

## 2018-09-13 MED ORDER — METFORMIN HCL ER 750 MG PO TB24: 1500.0000 mg | ORAL_TABLET | Freq: Every day | ORAL | 1 refills | Status: DC

## 2018-09-13 MED ORDER — SEMAGLUTIDE 3 MG PO TABS: 1.0000 | ORAL_TABLET | Freq: Every day | ORAL | 0 refills | Status: DC

## 2018-09-13 NOTE — Patient Instructions (Signed)
Vaginal Yeast infection, Adult    Vaginal yeast infection is a condition that causes vaginal discharge as well as soreness, swelling, and redness (inflammation) of the vagina. This is a common condition. Some women get this infection frequently.  What are the causes?  This condition is caused by a change in the normal balance of the yeast (candida) and bacteria that live in the vagina. This change causes an overgrowth of yeast, which causes the inflammation.  What increases the risk?  The condition is more likely to develop in women who:   Take antibiotic medicines.   Have diabetes.   Take birth control pills.   Are pregnant.   Douche often.   Have a weak body defense system (immune system).   Have been taking steroid medicines for a long time.   Frequently wear tight clothing.  What are the signs or symptoms?  Symptoms of this condition include:   White, thick, creamy vaginal discharge.   Swelling, itching, redness, and irritation of the vagina. The lips of the vagina (vulva) may be affected as well.   Pain or a burning feeling while urinating.   Pain during sex.  How is this diagnosed?  This condition is diagnosed based on:   Your medical history.   A physical exam.   A pelvic exam. Your health care provider will examine a sample of your vaginal discharge under a microscope. Your health care provider may send this sample for testing to confirm the diagnosis.  How is this treated?  This condition is treated with medicine. Medicines may be over-the-counter or prescription. You may be told to use one or more of the following:   Medicine that is taken by mouth (orally).   Medicine that is applied as a cream (topically).   Medicine that is inserted directly into the vagina (suppository).  Follow these instructions at home:    Lifestyle   Do not have sex until your health care provider approves. Tell your sex partner that you have a yeast infection. That person should go to his or her health care  provider and ask if they should also be treated.   Do not wear tight clothes, such as pantyhose or tight pants.   Wear breathable cotton underwear.  General instructions   Take or apply over-the-counter and prescription medicines only as told by your health care provider.   Eat more yogurt. This may help to keep your yeast infection from returning.   Do not use tampons until your health care provider approves.   Try taking a sitz bath to help with discomfort. This is a warm water bath that is taken while you are sitting down. The water should only come up to your hips and should cover your buttocks. Do this 3-4 times per day or as told by your health care provider.   Do not douche.   If you have diabetes, keep your blood sugar levels under control.   Keep all follow-up visits as told by your health care provider. This is important.  Contact a health care provider if:   You have a fever.   Your symptoms go away and then return.   Your symptoms do not get better with treatment.   Your symptoms get worse.   You have new symptoms.   You develop blisters in or around your vagina.   You have blood coming from your vagina and it is not your menstrual period.   You develop pain in your abdomen.  Summary     Vaginal yeast infection is a condition that causes discharge as well as soreness, swelling, and redness (inflammation) of the vagina.   This condition is treated with medicine. Medicines may be over-the-counter or prescription.   Take or apply over-the-counter and prescription medicines only as told by your health care provider.   Do not douche. Do not have sex or use tampons until your health care provider approves.   Contact a health care provider if your symptoms do not get better with treatment or your symptoms go away and then return.  This information is not intended to replace advice given to you by your health care provider. Make sure you discuss any questions you have with your health care  provider.  Document Released: 03/25/2005 Document Revised: 11/01/2017 Document Reviewed: 11/01/2017  Elsevier Interactive Patient Education  2019 Elsevier Inc.

## 2018-09-13 NOTE — Progress Notes (Signed)
GYNECOLOGY  VISIT   HPI: 63 y.o.   Divorced  Caucasian  female   G2P2002 with Patient's last menstrual period was 06/30/2003.   here for   Vaginal irritation she states it has been present for about two weeks.   Notes a rash on her vulva and thigh area.  Feel like a layer of skin has burnt off. It was painful when urine had contact with it.  Tucks causes burning sensation. Used Neosporin and then Vaseline.   No recent abx or steroids.   A1C 9.2. 12/21/17.  Not taking medication for diabetes.  Concerned about side effects of meds.  Trying to loose weight.   States she has neuropathy in her hands and feet.   GYNECOLOGIC HISTORY: Patient's last menstrual period was 06/30/2003. Contraception:  none Menopausal hormone therapy:  none Last mammogram:  05/08/18 Density C Bi-rads 2 benign  Last pap smear:  07/27/16 Neg HR HPV Neg         OB History    Gravida  2   Para  2   Term  2   Preterm      AB      Living  2     SAB      TAB      Ectopic      Multiple      Live Births  2              Patient Active Problem List   Diagnosis Date Noted  . Port catheter in place 11/02/2016  . Malignant neoplasm of lower-inner quadrant of left breast in female, estrogen receptor positive ([place]) 08/20/2016  . Depression, recurrent ([place]) 07/28/2016  . Routine general medical examination at a health care facility 06/02/2012  . Abnormal Pap smear of cervix 06/02/2012  . Other screening mammogram 06/02/2012  . Allergic rhinitis, cause unspecified 05/04/2012  . Type II or unspecified type diabetes mellitus without mention of complication, uncontrolled 04/03/2012  . Obesity, morbid ([place]) 03/31/2012  . Asthma, mild persistent 01/27/2012  . GERD (gastroesophageal reflux disease) 12/30/2011  . Preventative health care 12/29/2011    Past Medical History:  Diagnosis Date  . Abnormal Pap smear of cervix 2009  . Allergic rhinitis, cause unspecified   . Arthritis   . Arthritis of  ankle BILATERAL / HX FX'S  . Asthma, mild persistent   . Breast cancer ([place])   . Dyspnea    with much activity  . Fibroids 2008  . GERD (gastroesophageal reflux disease)    not current  . History of radiation therapy 01/27/17-03/16/17   left breast 1.8 Gy in 28 fractions, left breast boost 2 Gy in 6 fractions  . HSV-2 (herpes simplex virus 2) infection    at age 62  . Malignant neoplasm of lower-inner quadrant of left breast in female, estrogen receptor positive ([place]) 08/20/2016  . Obesity, morbid ([place])   . Personal history of chemotherapy   . Personal history of radiation therapy   . Pneumonia    2013, 2017  . Tear of medial meniscus of knee LEFT  . Type II or unspecified type diabetes mellitus without mention of complication, uncontrolled    Type II- has never been on medication    Past Surgical History:  Procedure Laterality Date  . BREAST BIOPSY    . BREAST LUMPECTOMY Left    09/22/16  . BREAST LUMPECTOMY WITH RADIOACTIVE SEED AND SENTINEL LYMPH NODE BIOPSY Left 09/22/2016   Procedure: BREAST LUMPECTOMY WITH RADIOACTIVE SEED AND  LEFT AXILLARY SENTINEL LYMPH NODE BIOPSY;  Surgeon: Alphonsa Overall, MD;  Location: [place];  Service: General;  Laterality: Left;  . CHONDROPLASTY  08/19/2011   Procedure: CHONDROPLASTY;  Surgeon: Magnus Sinning, MD;  Location: [facility];  Service: Orthopedics;;  shaving of medial femeral chondral  . DILATION AND CURETTAGE OF UTERUS    . HYSTEROSCOPY W/D&C  2008   benign endometrial polyp - Dr. Maryelizabeth Rowan  . KNEE ARTHROSCOPY W/ MENISCECTOMY Bilateral 07/07/2010   1 year apart  . MENISECTOMY  08/19/2011   Procedure: MENISECTOMY;  Surgeon: Magnus Sinning, MD;  Location: [facility];  Service: Orthopedics;;  partial lateral menisectomy  . PORT-A-CATH REMOVAL N/A 12/21/2017   Procedure: REMOVAL PORT-A-CATH;  Surgeon: Alphonsa Overall, MD;  Location: [place];  Service: General;  Laterality: N/A;  . PORTACATH PLACEMENT N/A 09/22/2016    Procedure: INSERTION PORT-A-CATH;  Surgeon: Alphonsa Overall, MD;  Location: [place];  Service: General;  Laterality: N/A;  . TONSILLECTOMY    . TUBAL LIGATION  AGE 32    Current Outpatient Medications  Medication Sig Dispense Refill  . anastrozole (ARIMIDEX) 1 MG tablet Take 1 tablet (1 mg total) by mouth daily. 90 tablet 3  . blood glucose meter kit and supplies KIT Dispense based on patient and insurance preference. Use up to four times daily as directed. (FOR ICD-9 250.00, 250.01). Check blood sugar BID. 1 each 0  . glucose blood test strip Use as instructed 100 each 12  . ibuprofen (ADVIL,MOTRIN) 200 MG tablet Take 400-600 mg by mouth every 6 (six) hours as needed for headache or mild pain (depends on pain if takes 2-3 tablets).     . loratadine (CLARITIN) 10 MG tablet Take 10 mg by mouth daily as needed for allergies.    Marland Kitchen losartan (COZAAR) 50 MG tablet Take 1 tablet (50 mg total) by mouth daily. 90 tablet 3  . Multiple Vitamins-Minerals (MULTIVITAMIN ADULT) CHEW Chew 2 each by mouth daily.     No current facility-administered medications for this visit.      ALLERGIES: No known allergies  Family History  Problem Relation Age of Onset  . Diabetes Mother   . Heart disease Father   . Cancer Brother 28       prostate  . Breast cancer Maternal Grandmother 88    Social History   Socioeconomic History  . Marital status: Divorced    Spouse name: Not on file  . Number of children: Not on file  . Years of education: Not on file  . Highest education level: Not on file  Occupational History  . Not on file  Social Needs  . Financial resource strain: Not on file  . Food insecurity:    Worry: Not on file    Inability: Not on file  . Transportation needs:    Medical: Not on file    Non-medical: Not on file  Tobacco Use  . Smoking status: Former Smoker    Years: 5.00    Types: Cigarettes    Last attempt to quit: 08/13/1986    Years since quitting: 32.1  . Smokeless tobacco:  Never Used  Substance and Sexual Activity  . Alcohol use: Yes    Comment: OCC.  . Drug use: No  . Sexual activity: Not Currently    Birth control/protection: Post-menopausal, Abstinence  Lifestyle  . Physical activity:    Days per week: Not on file    Minutes per session: Not on file  .  Stress: Not on file  Relationships  . Social connections:    Talks on phone: Not on file    Gets together: Not on file    Attends religious service: Not on file    Active member of club or organization: Not on file    Attends meetings of clubs or organizations: Not on file    Relationship status: Not on file  . Intimate partner violence:    Fear of current or ex partner: Not on file    Emotionally abused: Not on file    Physically abused: Not on file    Forced sexual activity: Not on file  Other Topics Concern  . Not on file  Social History Narrative  . Not on file    Review of Systems  Gastrointestinal: Positive for constipation and diarrhea.    PHYSICAL EXAMINATION:    BP 132/72   Pulse 88   Resp 18   Ht '5\' 3"'$  (1.6 m)   Wt (!) 304 lb 8 oz (138.1 kg)   LMP 06/30/2003   BMI 53.94 kg/m     General appearance: alert, cooperative and appears stated age    Pelvic: External genitalia:  White clumpy discharge.  Erythema of the vulva. Slits in the skin.               Urethra:  normal appearing urethra with no masses, tenderness or lesions              Bartholins and Skenes: normal                 Vagina: normal appearing vagina with normal color and white clumpy discharge, no lesions              Cervix: no lesions                Bimanual Exam:  Uterus:  normal size, contour, position, consistency, mobility, non-tender              Adnexa: no mass, fullness, tenderness         Chaperone was present for exam.  ASSESSMENT  Left breast cancer.  Status post lumpectomy, XRT and chemotherapy.  Now on Letrozole.   Vulvovaginitis.  Uncontrolled DM.   PLAN  Affirm.  Diflucan 150  mg po q 3 days x 3 doses.  Mycolog II to vulva bid x 1 week. We discussed yeast vulvovaginitis and its relationship to uncontrolled diabetes.  We reviewed complications of uncontrolled diabetes.  Will facilitate appt with PCP regarding untreated diabetes. Mammogram due this week.  Return for annual exam. An After Visit Summary was printed and given to the patient.  ___25___ minutes face to face time of which over 50% was spent in counseling.

## 2018-09-13 NOTE — Patient Instructions (Signed)
Type 2 Diabetes Mellitus, Diagnosis, Adult Type 2 diabetes (type 2 diabetes mellitus) is a long-term (chronic) disease. In type 2 diabetes, one or both of these problems may be present:  The pancreas does not make enough of a hormone called insulin.  Cells in the body do not respond properly to insulin that the body makes (insulin resistance). Normally, insulin allows blood sugar (glucose) to enter cells in the body. The cells use glucose for energy. Insulin resistance or lack of insulin causes excess glucose to build up in the blood instead of going into cells. As a result, high blood glucose (hyperglycemia) develops. What increases the risk? The following factors may make you more likely to develop type 2 diabetes:  Having a family member with type 2 diabetes.  Being overweight or obese.  Having an inactive (sedentary) lifestyle.  Having been diagnosed with insulin resistance.  Having a history of prediabetes, gestational diabetes, or polycystic ovary syndrome (PCOS).  Being of American-Indian, African-American, Hispanic/Latino, or Asian/Pacific Islander descent. What are the signs or symptoms? In the early stage of this condition, you may not have symptoms. Symptoms develop slowly and may include:  Increased thirst (polydipsia).  Increased hunger(polyphagia).  Increased urination (polyuria).  Increased urination during the night (nocturia).  Unexplained weight loss.  Frequent infections that keep coming back (recurring).  Fatigue.  Weakness.  Vision changes, such as blurry vision.  Cuts or bruises that are slow to heal.  Tingling or numbness in the hands or feet.  Dark patches on the skin (acanthosis nigricans). How is this diagnosed? This condition is diagnosed based on your symptoms, your medical history, a physical exam, and your blood glucose level. Your blood glucose may be checked with one or more of the following blood tests:  A fasting blood glucose (FBG)  test. You will not be allowed to eat (you will fast) for 8 hours or longer before a blood sample is taken.  A random blood glucose test. This test checks blood glucose at any time of day regardless of when you ate.  An A1c (hemoglobin A1c) blood test. This test provides information about blood glucose control over the previous 2-3 months.  An oral glucose tolerance test (OGTT). This test measures your blood glucose at two times: ? After fasting. This is your baseline blood glucose level. ? Two hours after drinking a beverage that contains glucose. You may be diagnosed with type 2 diabetes if:  Your FBG level is 126 mg/dL (7.0 mmol/L) or higher.  Your random blood glucose level is 200 mg/dL (11.1 mmol/L) or higher.  Your A1c level is 6.5% or higher.  Your OGTT result is higher than 200 mg/dL (11.1 mmol/L). These blood tests may be repeated to confirm your diagnosis. How is this treated? Your treatment may be managed by a specialist called an endocrinologist. Type 2 diabetes may be treated by following instructions from your health care provider about:  Making diet and lifestyle changes. This may include: ? Following an individualized nutrition plan that is developed by a diet and nutrition specialist (registered dietitian). ? Exercising regularly. ? Finding ways to manage stress.  Checking your blood glucose level as often as told.  Taking diabetes medicines or insulin daily. This helps to keep your blood glucose levels in the healthy range. ? If you use insulin, you may need to adjust the dosage depending on how physically active you are and what foods you eat. Your health care provider will tell you how to adjust your dosage.    Taking medicines to help prevent complications from diabetes, such as: ? Aspirin. ? Medicine to lower cholesterol. ? Medicine to control blood pressure. Your health care provider will set individualized treatment goals for you. Your goals will be based on  your age, other medical conditions you have, and how you respond to diabetes treatment. Generally, the goal of treatment is to maintain the following blood glucose levels:  Before meals (preprandial): 80-130 mg/dL (4.4-7.2 mmol/L).  After meals (postprandial): below 180 mg/dL (10 mmol/L).  A1c level: less than 7%. Follow these instructions at home: Questions to ask your health care provider  Consider asking the following questions: ? Do I need to meet with a diabetes educator? ? Where can I find a support group for people with diabetes? ? What equipment will I need to manage my diabetes at home? ? What diabetes medicines do I need, and when should I take them? ? How often do I need to check my blood glucose? ? What number can I call if I have questions? ? When is my next appointment? General instructions  Take over-the-counter and prescription medicines only as told by your health care provider.  Keep all follow-up visits as told by your health care provider. This is important.  For more information about diabetes, visit: ? American Diabetes Association (ADA): www.diabetes.org ? American Association of Diabetes Educators (AADE): www.diabeteseducator.org Contact a health care provider if:  Your blood glucose is at or above 240 mg/dL (13.3 mmol/L) for 2 days in a row.  You have been sick or have had a fever for 2 days or longer, and you are not getting better.  You have any of the following problems for more than 6 hours: ? You cannot eat or drink. ? You have nausea and vomiting. ? You have diarrhea. Get help right away if:  Your blood glucose is lower than 54 mg/dL (3.0 mmol/L).  You become confused or you have trouble thinking clearly.  You have difficulty breathing.  You have moderate or large ketone levels in your urine. Summary  Type 2 diabetes (type 2 diabetes mellitus) is a long-term (chronic) disease. In type 2 diabetes, the pancreas does not make enough of a  hormone called insulin, or cells in the body do not respond properly to insulin that the body makes (insulin resistance).  This condition is treated by making diet and lifestyle changes and taking diabetes medicines or insulin.  Your health care provider will set individualized treatment goals for you. Your goals will be based on your age, other medical conditions you have, and how you respond to diabetes treatment.  Keep all follow-up visits as told by your health care provider. This is important. This information is not intended to replace advice given to you by your health care provider. Make sure you discuss any questions you have with your health care provider. Document Released: 06/15/2005 Document Revised: 01/14/2017 Document Reviewed: 07/19/2015 Elsevier Interactive Patient Education  2019 Elsevier Inc.  

## 2018-09-13 NOTE — Progress Notes (Signed)
Patient scheduled to see Janith Lima, MD today at 3:30.  Encouraged mychart sign up. Email sent with link to [employer]

## 2018-09-13 NOTE — Progress Notes (Signed)
Subjective:  Patient ID: Sandra Wood, female    DOB: 06-02-1956  Age: 63 y.o. MRN: [id number]  CC: Annual Exam; Diabetes; Hyperlipidemia; and Hypertension   HPI Sandra Wood presents for a CPX.  She saw her gynecologist earlier today and is being treated for a vaginal yeast infection.  She does not monitor her blood sugar.  She complains of polydipsia and polyuria.  She also complains of mild numbness and tingling in her fingers and toes for several months.  She has recently lost weight with lifestyle modifications.  Outpatient Medications Prior to Visit  Medication Sig Dispense Refill  . anastrozole (ARIMIDEX) 1 MG tablet Take 1 tablet (1 mg total) by mouth daily. 90 tablet 3  . blood glucose meter kit and supplies KIT Dispense based on patient and insurance preference. Use up to four times daily as directed. (FOR ICD-9 250.00, 250.01). Check blood sugar BID. 1 each 0  . fluconazole (DIFLUCAN) 150 MG tablet Take one tablet (150 mg) by mouth every 3 days for a total of 3 doses. 3 tablet 0  . glucose blood test strip Use as instructed 100 each 12  . Multiple Vitamins-Minerals (MULTIVITAMIN ADULT) CHEW Chew 2 each by mouth daily.    Marland Kitchen nystatin-triamcinolone (MYCOLOG II) cream Apply 1 application topically 2 (two) times daily. Apply to affected area BID for up to 7 days. 60 g 0  . ibuprofen (ADVIL,MOTRIN) 200 MG tablet Take 400-600 mg by mouth every 6 (six) hours as needed for headache or mild pain (depends on pain if takes 2-3 tablets).     . loratadine (CLARITIN) 10 MG tablet Take 10 mg by mouth daily as needed for allergies.    Marland Kitchen losartan (COZAAR) 50 MG tablet Take 1 tablet (50 mg total) by mouth daily. (Patient taking differently: Take 10 mg by mouth daily. ) 90 tablet 3   No facility-administered medications prior to visit.     ROS Review of Systems  Constitutional: Negative for appetite change, diaphoresis, fatigue and unexpected weight change.  HENT: Negative.   Negative for trouble swallowing.   Eyes: Negative for visual disturbance.  Respiratory: Negative for cough, chest tightness, shortness of breath and wheezing.   Cardiovascular: Negative for chest pain, palpitations and leg swelling.  Gastrointestinal: Negative for abdominal pain, constipation, diarrhea, nausea and vomiting.  Endocrine: Positive for polydipsia and polyuria. Negative for polyphagia.  Genitourinary: Positive for frequency and pelvic pain. Negative for difficulty urinating, dysuria, hematuria, urgency, vaginal bleeding, vaginal discharge and vaginal pain.  Musculoskeletal: Negative.  Negative for arthralgias and myalgias.  Skin: Negative.  Negative for color change.  Neurological: Positive for numbness. Negative for dizziness, tremors, weakness, light-headedness and headaches.  Hematological: Negative for adenopathy. Does not bruise/bleed easily.  Psychiatric/Behavioral: Negative.     Objective:  BP 132/84 (BP Location: Left Arm, Patient Position: Sitting, Cuff Size: Large)   Pulse 90   Temp 98 F (36.7 C) (Oral)   Resp 16   Ht _0  (1.6 m)   Wt (!) 302 lb (137 kg)   LMP 06/30/2003   SpO2 92%   BMI 53.50 kg/m   BP Readings from Last 3 Encounters:  09/13/18 132/84  09/13/18 132/72  04/05/18 (!) 145/82    Wt Readings from Last 3 Encounters:  09/13/18 (!) 302 lb (137 kg)  09/13/18 (!) 304 lb 8 oz (138.1 kg)  04/05/18 (!) 317 lb 12.8 oz (144.2 kg)    Physical Exam Vitals signs reviewed.  Constitutional:  Appearance: She is obese. She is not ill-appearing or diaphoretic.  HENT:     Nose: Nose normal. No congestion or rhinorrhea.     Mouth/Throat:     Mouth: Mucous membranes are moist.     Pharynx: Oropharynx is clear. No oropharyngeal exudate or posterior oropharyngeal erythema.  Eyes:     Conjunctiva/sclera: Conjunctivae normal.  Neck:     Musculoskeletal: Normal range of motion and neck supple. No muscular tenderness.  Cardiovascular:     Rate and  Rhythm: Normal rate and regular rhythm.     Heart sounds: No murmur. No gallop.   Pulmonary:     Effort: Pulmonary effort is normal.     Breath sounds: No stridor. No wheezing, rhonchi or rales.  Abdominal:     General: Bowel sounds are normal. There is no distension.     Palpations: Abdomen is soft. There is no hepatomegaly, splenomegaly or mass.     Tenderness: There is no abdominal tenderness.  Musculoskeletal: Normal range of motion.        General: No swelling.     Right lower leg: No edema.     Left lower leg: No edema.  Lymphadenopathy:     Cervical: No cervical adenopathy.  Skin:    General: Skin is warm and dry.  Neurological:     General: No focal deficit present.     Mental Status: She is oriented to person, place, and time. Mental status is at baseline.     Sensory: Sensory deficit present.     Motor: No weakness.     Gait: Gait normal.     Deep Tendon Reflexes: Reflexes normal.  Psychiatric:        Mood and Affect: Mood normal.        Behavior: Behavior normal.     Lab Results  Component Value Date   WBC 13.0 (H) 09/13/2018   HGB 15.4 (H) 09/13/2018   HCT 48.1 (H) 09/13/2018   PLT 359.0 09/13/2018   GLUCOSE 385 (H) 09/13/2018   CHOL 237 (H) 09/13/2018   TRIG 238.0 (H) 09/13/2018   HDL 46.30 09/13/2018   LDLDIRECT 177.0 09/13/2018   ALT 17 09/13/2018   AST 14 09/13/2018   NA 137 09/13/2018   K 4.2 09/13/2018   CL 102 09/13/2018   CREATININE 0.80 09/13/2018   BUN 10 09/13/2018   CO2 25 09/13/2018   TSH 3.15 09/13/2018   INR 1.1 RATIO 07/09/2006   HGBA1C 12.5 (A) 09/13/2018   MICROALBUR 2.1 (H) 09/13/2018    US Breast Ltd Uni Right Inc Axilla  Result Date: 04/08/2018 CLINICAL DATA:  63 year old female presenting for a palpable lump in the upper inner right breast. The palpable area was noticed 1 week ago, and is at the site of her port scar. EXAM: DIGITAL DIAGNOSTIC RIGHT MAMMOGRAM WITH CAD AND TOMO ULTRASOUND RIGHT BREAST COMPARISON:  Previous  exam(s). ACR Breast Density Category c: The breast tissue is heterogeneously dense, which may obscure small masses. FINDINGS: A BB has been placed at the palpable site in the upper inner right breast. Deep to the palpable marker there are 2 oval circumscribed masses. The larger of the 2 measures approximately 1.4 cm, and has multiple locules of macroscopic fat, consistent with fat necrosis. Mammographic images were processed with CAD. Ultrasound of the palpable site in the right breast at 130, 9 cm from the nipple demonstrates 2 adjacent hypoechoic masses immediately deep to the skin surface. The largest measures 9 mm and the smaller  up to 5 mm. This appearance is consistent with benign fat necrosis. IMPRESSION: The palpable lump in the right breast corresponds with benign fat necrosis from the patient's port removal. RECOMMENDATION: Bilateral diagnostic mammogram is recommended in February of 2020. I have discussed the findings and recommendations with the patient. Results were also provided in writing at the conclusion of the visit. If applicable, a reminder letter will be sent to the patient regarding the next appointment. BI-RADS CATEGORY  2: Benign. Electronically Signed   By: Ammie Ferrier M.D.   On: 04/08/2018 14:03   Mm Diag Breast Tomo Uni Right  Result Date: 04/08/2018 CLINICAL DATA:  63 year old female presenting for a palpable lump in the upper inner right breast. The palpable area was noticed 1 week ago, and is at the site of her port scar. EXAM: DIGITAL DIAGNOSTIC RIGHT MAMMOGRAM WITH CAD AND TOMO ULTRASOUND RIGHT BREAST COMPARISON:  Previous exam(s). ACR Breast Density Category c: The breast tissue is heterogeneously dense, which may obscure small masses. FINDINGS: A BB has been placed at the palpable site in the upper inner right breast. Deep to the palpable marker there are 2 oval circumscribed masses. The larger of the 2 measures approximately 1.4 cm, and has multiple locules of  macroscopic fat, consistent with fat necrosis. Mammographic images were processed with CAD. Ultrasound of the palpable site in the right breast at 130, 9 cm from the nipple demonstrates 2 adjacent hypoechoic masses immediately deep to the skin surface. The largest measures 9 mm and the smaller up to 5 mm. This appearance is consistent with benign fat necrosis. IMPRESSION: The palpable lump in the right breast corresponds with benign fat necrosis from the patient's port removal. RECOMMENDATION: Bilateral diagnostic mammogram is recommended in February of 2020. I have discussed the findings and recommendations with the patient. Results were also provided in writing at the conclusion of the visit. If applicable, a reminder letter will be sent to the patient regarding the next appointment. BI-RADS CATEGORY  2: Benign. Electronically Signed   By: Ammie Ferrier M.D.   On: 04/08/2018 14:03    Assessment & Plan:   Makela was seen today for annual exam, diabetes, hyperlipidemia and hypertension.  Diagnoses and all orders for this visit:  Type II diabetes mellitus with manifestations, uncontrolled ([place])- Her A1c is at 12.5%.  Her blood sugars are not well controlled.  She is glucose toxic but her lab work is negative for hyperosmolarity or ketosis.  In addition to lifestyle modifications I have asked her to start taking a GLP-1 agonist, basal insulin, and metformin. -     Comprehensive metabolic panel; Future -     CBC with Differential/Platelet; Future -     Urinalysis, Routine w reflex microscopic; Future -     Microalbumin / creatinine urine ratio; Future -     Ambulatory referral to Ophthalmology -     HM Diabetes Foot Exam -     POCT glycosylated hemoglobin (Hb A1C) -     POCT Glucose (Device for Home Use) -     Insulin Degludec (TRESIBA FLEXTOUCH) 200 UNIT/ML SOPN; Inject 60 Units into the skin daily. -     Continuous Blood Gluc Receiver (FREESTYLE LIBRE 14 DAY READER) DEVI; 1 Act by Does not  apply route daily. -     Continuous Blood Gluc Sensor (FREESTYLE LIBRE 14 DAY SENSOR) MISC; 1 Act by Does not apply route daily. -     Semaglutide (RYBELSUS) 3 MG TABS; Take 1 tablet  by mouth daily. -     Semaglutide (RYBELSUS) 7 MG TABS; Take 1 tablet by mouth daily. -     losartan (COZAAR) 50 MG tablet; Take 1 tablet (50 mg total) by mouth daily. -     metFORMIN (GLUCOPHAGE XR) 750 MG 24 hr tablet; Take 2 tablets (1,500 mg total) by mouth daily with breakfast. -     Amb Referral to Nutrition and Diabetic E -     Ambulatory referral to Endocrinology -     Insulin Pen Needle (NOVOFINE) 32G X 6 MM MISC; 1 Act by Does not apply route daily.  Routine general medical examination at a health care facility- Exam completed, labs reviewed, vaccines reviewed and updated, she is referred for a screening colonoscopy, Pap and mammogram are up-to-date, patient education material was given. -     Lipid panel; Future -     HIV Antibody (routine testing w rflx); Future  Need for hepatitis C screening test -     Hepatitis C antibody; Future  Hyperlipidemia LDL goal <100- I have asked her to start taking a statin for CV risk reduction. -     Comprehensive metabolic panel; Future -     TSH; Future -     rosuvastatin (CRESTOR) 20 MG tablet; Take 1 tablet (20 mg total) by mouth daily.  Bilateral numbness and tingling of arms and legs- She has B12 deficiency. -     Vitamin B12; Future -     Folate; Future  Screening for colon cancer -     Ambulatory referral to Gastroenterology  Need for influenza vaccination -     Flu Vaccine QUAD 36+ mos IM  Need for pneumococcal vaccination -     Pneumococcal polysaccharide vaccine 23-valent greater than or equal to 2yo subcutaneous/IM  Essential hypertension- Her blood pressure is adequately well controlled. -     losartan (COZAAR) 50 MG tablet; Take 1 tablet (50 mg total) by mouth daily.  Vitamin B12 deficiency neuropathy ([place])- I have asked her to start  receiving a parenteral B12 supplement.  Bacteriuria with pyuria- I will screen her urine to see if she has a bacterial UTI. -     CULTURE, URINE COMPREHENSIVE; Future   I have discontinued Layla L. Hargadon's ibuprofen and loratadine. I am also having her start on Insulin Degludec, FreeStyle Libre 14 Day Reader, FreeStyle Libre 14 Day Sensor, Semaglutide, Semaglutide, metFORMIN, Insulin Pen Needle, and rosuvastatin. Additionally, I am having her maintain her blood glucose meter kit and supplies, glucose blood, Multivitamin Adult, anastrozole, fluconazole, nystatin-triamcinolone, and losartan.  Meds ordered this encounter  Medications  . Insulin Degludec (TRESIBA FLEXTOUCH) 200 UNIT/ML SOPN    Sig: Inject 60 Units into the skin daily.    Dispense:  9 mL    Refill:  1  . Continuous Blood Gluc Receiver (FREESTYLE LIBRE 14 DAY READER) DEVI    Sig: 1 Act by Does not apply route daily.    Dispense:  1 Device    Refill:  11  . Continuous Blood Gluc Sensor (FREESTYLE LIBRE 14 DAY SENSOR) MISC    Sig: 1 Act by Does not apply route daily.    Dispense:  1 each    Refill:  11  . Semaglutide (RYBELSUS) 3 MG TABS    Sig: Take 1 tablet by mouth daily.    Dispense:  30 tablet    Refill:  0  . Semaglutide (RYBELSUS) 7 MG TABS    Sig: Take 1 tablet by  mouth daily.    Dispense:  90 tablet    Refill:  1  . losartan (COZAAR) 50 MG tablet    Sig: Take 1 tablet (50 mg total) by mouth daily.    Dispense:  90 tablet    Refill:  1  . metFORMIN (GLUCOPHAGE XR) 750 MG 24 hr tablet    Sig: Take 2 tablets (1,500 mg total) by mouth daily with breakfast.    Dispense:  180 tablet    Refill:  1  . Insulin Pen Needle (NOVOFINE) 32G X 6 MM MISC    Sig: 1 Act by Does not apply route daily.    Dispense:  100 each    Refill:  3  . rosuvastatin (CRESTOR) 20 MG tablet    Sig: Take 1 tablet (20 mg total) by mouth daily.    Dispense:  90 tablet    Refill:  1     Follow-up: Return in about 4 months (around  01/13/2019).  Scarlette Calico, MD

## 2018-09-14 ENCOUNTER — Other Ambulatory Visit: Payer: No Typology Code available for payment source

## 2018-09-14 ENCOUNTER — Ambulatory Visit (INDEPENDENT_AMBULATORY_CARE_PROVIDER_SITE_OTHER): Payer: No Typology Code available for payment source | Admitting: *Deleted

## 2018-09-14 ENCOUNTER — Telehealth: Payer: Self-pay

## 2018-09-14 DIAGNOSIS — E538 Deficiency of other specified B group vitamins: Secondary | ICD-10-CM | POA: Diagnosis not present

## 2018-09-14 DIAGNOSIS — R8271 Bacteriuria: Secondary | ICD-10-CM

## 2018-09-14 DIAGNOSIS — G63 Polyneuropathy in diseases classified elsewhere: Secondary | ICD-10-CM | POA: Diagnosis not present

## 2018-09-14 DIAGNOSIS — R8281 Pyuria: Secondary | ICD-10-CM

## 2018-09-14 DIAGNOSIS — I10 Essential (primary) hypertension: Secondary | ICD-10-CM | POA: Insufficient documentation

## 2018-09-14 LAB — URINALYSIS, ROUTINE W REFLEX MICROSCOPIC
Bilirubin Urine: NEGATIVE
Ketones, ur: NEGATIVE
LEUKOCYTE UA: NEGATIVE
NITRITE: POSITIVE — AB
SPECIFIC GRAVITY, URINE: 1.02 (ref 1.000–1.030)
TOTAL PROTEIN, URINE-UPE24: NEGATIVE
Urobilinogen, UA: 0.2 (ref 0.0–1.0)
pH: 5.5 (ref 5.0–8.0)

## 2018-09-14 LAB — FOLATE: Folate: 11.3 ng/mL (ref >5.9)

## 2018-09-14 LAB — HEPATITIS C ANTIBODY
HEP C AB: NONREACTIVE
SIGNAL TO CUT-OFF: 0.02 (ref <1.00)

## 2018-09-14 LAB — VAGINITIS/VAGINOSIS, DNA PROBE
Candida Species: POSITIVE — AB
GARDNERELLA VAGINALIS: NEGATIVE
TRICHOMONAS VAG: NEGATIVE

## 2018-09-14 LAB — HIV ANTIBODY (ROUTINE TESTING W REFLEX): HIV 1&2 Ab, 4th Generation: NONREACTIVE

## 2018-09-14 LAB — VITAMIN B12: VITAMIN B 12: 150 pg/mL — AB (ref 211–911)

## 2018-09-14 MED ORDER — INSULIN PEN NEEDLE 32G X 4 MM MISC: 1 refills | Status: DC

## 2018-09-14 MED ORDER — CYANOCOBALAMIN 1000 MCG/ML IJ SOLN
1000.0000 ug | Freq: Once | INTRAMUSCULAR | Status: AC
  Administered 2018-09-14: 1000 ug via INTRAMUSCULAR

## 2018-09-14 MED ORDER — ROSUVASTATIN CALCIUM 20 MG PO TABS: 20.0000 mg | ORAL_TABLET | Freq: Every day | ORAL | 1 refills | Status: DC

## 2018-09-14 NOTE — Progress Notes (Signed)
I have reviewed and agree.

## 2018-09-14 NOTE — Telephone Encounter (Signed)
Received PA for the novofine.  Called pharmacy and they stated that the BD nano should be covered. Resending rx for same.

## 2018-09-15 ENCOUNTER — Ambulatory Visit: Payer: Self-pay | Admitting: Obstetrics and Gynecology

## 2018-09-15 ENCOUNTER — Encounter: Payer: Self-pay | Admitting: Obstetrics and Gynecology

## 2018-09-17 ENCOUNTER — Other Ambulatory Visit (HOSPITAL_COMMUNITY): Payer: Self-pay | Admitting: Internal Medicine

## 2018-09-17 LAB — CULTURE, URINE COMPREHENSIVE
MICRO NUMBER: 337056
SPECIMEN QUALITY:: ADEQUATE

## 2018-09-18 ENCOUNTER — Other Ambulatory Visit: Payer: Self-pay | Admitting: Internal Medicine

## 2018-09-18 DIAGNOSIS — N390 Urinary tract infection, site not specified: Principal | ICD-10-CM

## 2018-09-18 DIAGNOSIS — B9620 Unspecified Escherichia coli [E. coli] as the cause of diseases classified elsewhere: Secondary | ICD-10-CM | POA: Insufficient documentation

## 2018-09-18 MED ORDER — SULFAMETHOXAZOLE-TRIMETHOPRIM 800-160 MG PO TABS: 1.0000 | ORAL_TABLET | Freq: Two times a day (BID) | ORAL | 0 refills | Status: AC

## 2018-09-19 ENCOUNTER — Telehealth: Payer: Self-pay

## 2018-09-19 ENCOUNTER — Telehealth: Payer: Self-pay | Admitting: Internal Medicine

## 2018-09-19 NOTE — Telephone Encounter (Signed)
Attempted to call patient- left message to call back regarding lab results

## 2018-09-19 NOTE — Telephone Encounter (Signed)
LVM for pt to call back as soon as possible.   RE: PCP response

## 2018-09-19 NOTE — Telephone Encounter (Signed)
Called pt back and pt wanted to know if there was a lower dose of the losartan. I informed that we wanted her to try it and let us know if she develops any symptoms as the previous symptoms could have been exacerbated by the chemo treatment that she was doing. Pt agreed to take the 50mg  of losartan and let us know about symptoms.

## 2018-09-19 NOTE — Telephone Encounter (Signed)
error 

## 2018-09-19 NOTE — Telephone Encounter (Signed)
I do not think lisinopril will be an effective antihypertensive for her which is why I asked her to upgrade to losartan.  Please ask her to give losartan a try.

## 2018-09-19 NOTE — Telephone Encounter (Signed)
Called pharmacy and confirmed.  lisinopril 10 mg - last picked up 02/21/2018 90 day supply rx'ed by Dr. Aundra Dubin. Losartan 50 mg - 01/02/2018 rxed by Dr. Aundra Dubin  Pt is requesting to keep taking the lisinopril 10 mg instead of the losartan 50 mg. Please advise

## 2018-09-19 NOTE — Telephone Encounter (Signed)
° ° °  Pt returned your call she is aware of what the doctor want her to take but she has questions

## 2018-09-19 NOTE — Telephone Encounter (Signed)
Patient is calling to clarify her medication needs- patient states she has been taking - lisinopril 10 mg daily for over 1 year. Patient is not taking the Losartin. Patient would like to have Dr Ronnald Ramp review records from Dr Aundra Dubin- she has had problems with taking the Losartin in the past. Patient needs the Lisinopril filled she normally gets #90.

## 2018-09-19 NOTE — Telephone Encounter (Signed)
Pt called for Lab results. NT unavailable. Please reach back out to pt.  Copied from [place] [id number]. Topic: Quick Communication - Lab Results (Clinic Use ONLY) >> Sep 19, 2018  8:14 AM Cairrikier Dian Queen, CMA wrote: May give pt result.

## 2018-09-20 ENCOUNTER — Other Ambulatory Visit: Payer: Self-pay | Admitting: Internal Medicine

## 2018-09-20 DIAGNOSIS — E118 Type 2 diabetes mellitus with unspecified complications: Principal | ICD-10-CM

## 2018-09-20 DIAGNOSIS — IMO0002 Reserved for concepts with insufficient information to code with codable children: Secondary | ICD-10-CM

## 2018-09-20 DIAGNOSIS — E1165 Type 2 diabetes mellitus with hyperglycemia: Secondary | ICD-10-CM

## 2018-09-20 MED ORDER — GLUCOPHAGE XR 750 MG PO TB24: 1500.0000 mg | ORAL_TABLET | Freq: Every day | ORAL | 1 refills | Status: DC

## 2018-09-22 ENCOUNTER — Other Ambulatory Visit: Payer: Self-pay | Admitting: Internal Medicine

## 2018-09-22 ENCOUNTER — Telehealth: Payer: Self-pay | Admitting: Internal Medicine

## 2018-09-22 DIAGNOSIS — I10 Essential (primary) hypertension: Secondary | ICD-10-CM

## 2018-09-22 DIAGNOSIS — E1165 Type 2 diabetes mellitus with hyperglycemia: Secondary | ICD-10-CM

## 2018-09-22 DIAGNOSIS — E118 Type 2 diabetes mellitus with unspecified complications: Principal | ICD-10-CM

## 2018-09-22 DIAGNOSIS — IMO0002 Reserved for concepts with insufficient information to code with codable children: Secondary | ICD-10-CM

## 2018-09-22 MED ORDER — LOSARTAN POTASSIUM 50 MG PO TABS: 50.0000 mg | ORAL_TABLET | Freq: Every day | ORAL | 1 refills | Status: DC

## 2018-09-22 MED ORDER — GLUCOPHAGE XR 750 MG PO TB24: 1500.0000 mg | ORAL_TABLET | Freq: Every day | ORAL | 1 refills | Status: DC

## 2018-09-22 NOTE — Telephone Encounter (Signed)
I called pt and pt stated that the pharmacy has not received.   I called and confirmed with pharmacy.   Can the glucophage and the losartan be resent? Pharmacy stated that they did not received either one.

## 2018-09-22 NOTE — Telephone Encounter (Signed)
The metformin prescription was sent in 2 days ago. When I last saw her I asked her to start taking Rybelsus. I gave her samples for the first month. I do not understand why there is so much confusion about her medications.

## 2018-09-22 NOTE — Telephone Encounter (Signed)
Ask her to clarify which pharmacy she is using

## 2018-09-22 NOTE — Telephone Encounter (Signed)
Copied from [place] [id number]. Topic: Quick Communication - See Telephone Encounter >> Sep 22, 2018  9:23 AM Ahmed Prima L wrote: CRM for notification. See Telephone encounter for: 09/22/18. Patient states she is still waiting on Dr Ronnald Ramp to call her Metformin in and she also said there was another medication that he was going to put her on ( she doesn't know the name ) & she contacted the pharmacy and they told her she needed to ask Dr Ronnald Ramp about this. Please Advise.

## 2018-09-22 NOTE — Telephone Encounter (Signed)
[facility] on [address] - 

## 2018-09-22 NOTE — Telephone Encounter (Signed)
Pt stated that metformin was to be called in.   Please advise

## 2018-09-23 ENCOUNTER — Ambulatory Visit: Payer: No Typology Code available for payment source | Admitting: Internal Medicine

## 2018-09-23 ENCOUNTER — Ambulatory Visit: Payer: No Typology Code available for payment source

## 2018-09-26 ENCOUNTER — Ambulatory Visit (INDEPENDENT_AMBULATORY_CARE_PROVIDER_SITE_OTHER): Payer: No Typology Code available for payment source | Admitting: Internal Medicine

## 2018-09-26 ENCOUNTER — Telehealth: Payer: Self-pay

## 2018-09-26 DIAGNOSIS — H8303 Labyrinthitis, bilateral: Secondary | ICD-10-CM | POA: Diagnosis not present

## 2018-09-26 MED ORDER — PROMETHAZINE HCL 12.5 MG PO TABS: 12.5000 mg | ORAL_TABLET | Freq: Four times a day (QID) | ORAL | 0 refills | Status: DC | PRN

## 2018-09-26 MED ORDER — PREDNISONE 20 MG PO TABS: 20.0000 mg | ORAL_TABLET | Freq: Two times a day (BID) | ORAL | 0 refills | Status: AC

## 2018-09-26 NOTE — Telephone Encounter (Signed)
Sx started on Saturday. Bilateral pain intermittent, vertigo to the point that she could not walk with vomiting.    Patient has agreed to virtual visit.  Patient is scheduled with Dr. Ronnald Ramp on 09/26/2018 at 10:00am The following information has been emailed to patient:   Please DO NOT reply to this e-mail.   If you have questions about this appointment, please call your [facility] office.  If you have a medical emergency, please call 911.  Please complete the following prior to appointment:  Download and install software [employer]) from either the Internet or Play Store/Appstore onto the device you plan to use during the virtual visit.  Once installation is complete, close the app/software.  Reopen this email. Click the green "Join Meeting" link a few minutes before your scheduled virtual visit.

## 2018-09-26 NOTE — Progress Notes (Signed)
Virtual Visit via Video Note  I connected with Sandra Wood on 09/26/18 at 10:00 AM EDT by a video enabled telemedicine application and verified that I am speaking with the correct person using two identifiers.   I discussed the limitations of evaluation and management by telemedicine and the availability of in person appointments. The patient expressed understanding and agreed to proceed.  History of Present Illness: Sandra Wood called in today complaining of a 2-day history of nausea, vomiting, dizziness, vertigo, and discomfort in both ears.  She denies numbness, weakness, tingling, or ataxia.    Observations/Objective: During the virtual visit she was sitting upright and was alert and awake.  There were no focal deficits noted.  She appeared fatigued but was not vomiting.   Assessment and Plan: I think she has acute viral labyrinthitis.  She tells me her blood sugar earlier today was 123.  I have asked her to take a 5-day course of prednisone to reduce the inflammation in her inner ears and to help with symptom relief.  I have asked her to monitor her blood sugars closely.  She will also take promethazine as needed for nausea and vomiting.   Follow Up Instructions: I have asked her to rest for the next day or 2 and to let me know if she develops any new or worsening symptoms.    I discussed the assessment and treatment plan with the patient. The patient was provided an opportunity to ask questions and all were answered. The patient agreed with the plan and demonstrated an understanding of the instructions.   The patient was advised to call back or seek an in-person evaluation if the symptoms worsen or if the condition fails to improve as anticipated.  I provided 20 minutes of non-face-to-face time during this encounter.   Scarlette Calico, MD

## 2018-09-30 ENCOUNTER — Ambulatory Visit: Payer: No Typology Code available for payment source

## 2018-10-05 ENCOUNTER — Other Ambulatory Visit: Payer: Self-pay | Admitting: Internal Medicine

## 2018-10-05 ENCOUNTER — Telehealth: Payer: Self-pay

## 2018-10-05 DIAGNOSIS — E118 Type 2 diabetes mellitus with unspecified complications: Principal | ICD-10-CM

## 2018-10-05 DIAGNOSIS — E1165 Type 2 diabetes mellitus with hyperglycemia: Secondary | ICD-10-CM

## 2018-10-05 DIAGNOSIS — IMO0002 Reserved for concepts with insufficient information to code with codable children: Secondary | ICD-10-CM

## 2018-10-05 MED ORDER — METFORMIN HCL 1000 MG PO TABS: 1000.0000 mg | ORAL_TABLET | Freq: Two times a day (BID) | ORAL | 1 refills | Status: DC

## 2018-10-05 NOTE — Telephone Encounter (Signed)
Pt informed new rx was sent.  

## 2018-10-05 NOTE — Telephone Encounter (Signed)
Pharmacy sent a fax and it stated the they are out of the Glucophage 24 hour 750 mg tablet and is wanting an alternative sent in. Please advise

## 2018-10-05 NOTE — Telephone Encounter (Signed)
New RX sent

## 2018-10-06 ENCOUNTER — Ambulatory Visit: Payer: No Typology Code available for payment source

## 2018-10-12 ENCOUNTER — Ambulatory Visit: Payer: No Typology Code available for payment source | Admitting: Obstetrics and Gynecology

## 2018-10-12 ENCOUNTER — Telehealth: Payer: Self-pay | Admitting: Internal Medicine

## 2018-10-12 NOTE — Telephone Encounter (Unsigned)
Copied from [place] [id number]. Topic: Quick Communication - See Telephone Encounter >> Oct 12, 2018  6:10 PM Valla Leaver wrote: CRM for notification. See Telephone encounter for: 10/12/18. Patient says Semaglutide (RYBELSUS) 7 MG TABS is being held at the pharmacy and says the insurance needs approval from Dr. Ronnald Ramp. Patient only has one pill left for tomorrow morning.

## 2018-10-13 NOTE — Telephone Encounter (Signed)
PA has been approved.  Pt informed of same.

## 2018-10-13 NOTE — Telephone Encounter (Signed)
Already started PA. Key: Sandra Wood

## 2018-10-24 ENCOUNTER — Ambulatory Visit
Admission: RE | Admit: 2018-10-24 | Discharge: 2018-10-24 | Disposition: A | Discharge status: Home / Self Care | Payer: No Typology Code available for payment source | Source: Ambulatory Visit | Attending: Hematology and Oncology | Admitting: Hematology and Oncology

## 2018-10-24 ENCOUNTER — Inpatient Hospital Stay: Admission: RE | Admit: 2018-10-24 | Payer: No Typology Code available for payment source | Source: Ambulatory Visit

## 2018-10-24 ENCOUNTER — Other Ambulatory Visit: Payer: Self-pay

## 2018-10-24 DIAGNOSIS — Z853 Personal history of malignant neoplasm of breast: Secondary | ICD-10-CM

## 2018-11-04 ENCOUNTER — Encounter: Payer: Self-pay | Admitting: Internal Medicine

## 2018-11-04 ENCOUNTER — Other Ambulatory Visit: Payer: Self-pay

## 2018-11-04 ENCOUNTER — Ambulatory Visit (INDEPENDENT_AMBULATORY_CARE_PROVIDER_SITE_OTHER): Payer: No Typology Code available for payment source

## 2018-11-04 ENCOUNTER — Ambulatory Visit: Payer: No Typology Code available for payment source | Admitting: Internal Medicine

## 2018-11-04 VITALS — BP 106/60 | HR 96 | Temp 98.4°F | Ht 61.42 in | Wt 291.6 lb

## 2018-11-04 DIAGNOSIS — E538 Deficiency of other specified B group vitamins: Secondary | ICD-10-CM

## 2018-11-04 DIAGNOSIS — E1165 Type 2 diabetes mellitus with hyperglycemia: Secondary | ICD-10-CM

## 2018-11-04 DIAGNOSIS — E118 Type 2 diabetes mellitus with unspecified complications: Secondary | ICD-10-CM

## 2018-11-04 DIAGNOSIS — IMO0002 Reserved for concepts with insufficient information to code with codable children: Secondary | ICD-10-CM

## 2018-11-04 LAB — POCT GLYCOSYLATED HEMOGLOBIN (HGB A1C): Hemoglobin A1C: 8.8 % — AB (ref 4.0–5.6)

## 2018-11-04 MED ORDER — INSULIN DEGLUDEC 200 UNIT/ML ~~LOC~~ SOPN: 50.0000 [IU] | PEN_INJECTOR | Freq: Every day | SUBCUTANEOUS | 1 refills | Status: DC

## 2018-11-04 MED ORDER — CYANOCOBALAMIN 1000 MCG/ML IJ SOLN
1000.0000 ug | Freq: Once | INTRAMUSCULAR | Status: AC
  Administered 2018-11-04: 1000 ug via INTRAMUSCULAR

## 2018-11-04 NOTE — Patient Instructions (Addendum)
-   Decrease to 50 Units daily  - Continue Metformin  1000 mg twice a day with meals - Continue Rybelsus 7 mg daily    Choose healthy, lower carb lower calorie snacks: toss salad, cooked vegetables, cottage cheese, peanut butter, low fat cheese / string cheese, lower sodium deli meat, tuna salad or chicken salad    HOW TO TREAT LOW BLOOD SUGARS (Blood sugar LESS THAN 70 MG/DL)  Please follow the RULE OF 15 for the treatment of hypoglycemia treatment (when your (blood sugars are less than 70 mg/dL)    STEP 1: Take 15 grams of carbohydrates when your blood sugar is low, which includes:   3-4 GLUCOSE TABS  OR  3-4 OZ OF JUICE OR REGULAR SODA OR  ONE TUBE OF GLUCOSE GEL     STEP 2: RECHECK blood sugar in 15 MINUTES STEP 3: If your blood sugar is still low at the 15 minute recheck --> then, go back to STEP 1 and treat AGAIN with another 15 grams of carbohydrates.

## 2018-11-04 NOTE — Progress Notes (Signed)
Name: Sandra Wood  MRN/ DOB: [id number], [birth year]   Age/ Sex: 63 y.o., female    PCP: Janith Lima, MD   Reason for Endocrinology Evaluation: Type 2 Diabetes Mellitus     Date of Initial Endocrinology Visit: 11/07/2018     PATIENT IDENTIFIER: Sandra Wood is a 63 y.o. female with a past medical history of HTN, T2DM, Asthma and Hx of Breast Ca (2018) . The patient presented for initial endocrinology clinic visit on 11/07/2018 for consultative assistance with Sandra Wood diabetes management.    HPI: Sandra Wood was    Diagnosed with T2DM in 2013 Prior Medications tried/Intolerance: She was on no medications until 08/2018 when she as started on  Insulin, Rybelsus and metformin Currently checking blood sugars 1 x / day,  before breakfast  Hypoglycemia episodes : 0             Hemoglobin A1c has ranged from 6.6% in 2013, peaking at 12.5% in 2020 Patient required assistance for hypoglycemia: no Patient has required hospitalization within the last 1 year from hyper or hypoglycemia: no  In terms of diet, the patient avoids sugar sweetened beverages. Snacks once a day (glucerna shaked)    HOME DIABETES REGIMEN: Tresiba 60 units daily  Metformin 1000 mg BID Rybelsus 7 mg daily    Statin: yes ACE-I/ARB: yes Prior Diabetic Education: no    CONTINUOUS GLUCOSE MONITORING RECORD INTERPRETATION    Dates of Recording: 9/4-02/01/2019  Sensor description: freestyle libre   Results statistics:   CGM use % of time   Average and SD 120  Time in range     49   %  % Time Above 180 49  % Time above 250 0  % Time Below target 1     Hypoglycemic episodes occurred minimally at night      DIABETIC COMPLICATIONS: Microvascular complications:   neuropathy  Denies: CKD,   Last eye exam: Completed years ago  Macrovascular complications:    Denies: CAD, PVD, CVA   PAST HISTORY: Past Medical History:  Past Medical History:  Diagnosis Date  . Abnormal Pap smear  of cervix 2009  . Allergic rhinitis, cause unspecified   . Arthritis   . Arthritis of ankle BILATERAL / HX FX'S  . Asthma, mild persistent   . Breast cancer ([place])   . Dyspnea    with much activity  . Fibroids 2008  . GERD (gastroesophageal reflux disease)    not current  . History of radiation therapy 01/27/17-03/16/17   left breast 1.8 Gy in 28 fractions, left breast boost 2 Gy in 6 fractions  . HSV-2 (herpes simplex virus 2) infection    at age 8  . Malignant neoplasm of lower-inner quadrant of left breast in female, estrogen receptor positive ([place]) 08/20/2016  . Obesity, morbid ([place])   . Personal history of chemotherapy   . Personal history of radiation therapy   . Pneumonia    2013, 2017  . Tear of medial meniscus of knee LEFT  . Type II or unspecified type diabetes mellitus without mention of complication, uncontrolled    Type II- has never been on medication   Past Surgical History:  Past Surgical History:  Procedure Laterality Date  . BREAST BIOPSY    . BREAST LUMPECTOMY Left    09/22/16  . BREAST LUMPECTOMY WITH RADIOACTIVE SEED AND SENTINEL LYMPH NODE BIOPSY Left 09/22/2016   Procedure: BREAST LUMPECTOMY WITH RADIOACTIVE SEED AND LEFT AXILLARY SENTINEL LYMPH NODE BIOPSY;  Surgeon: Alphonsa Overall, MD;  Location: [facility];  Service: General;  Laterality: Left;  . CHONDROPLASTY  08/19/2011   Procedure: CHONDROPLASTY;  Surgeon: Magnus Sinning, MD;  Location: [facility];  Service: Orthopedics;;  shaving of medial femeral chondral  . DILATION AND CURETTAGE OF UTERUS    . HYSTEROSCOPY W/D&C  2008   benign endometrial polyp - Dr. Maryelizabeth Rowan  . KNEE ARTHROSCOPY W/ MENISCECTOMY Bilateral 07/07/2010   1 year apart  . MENISECTOMY  08/19/2011   Procedure: MENISECTOMY;  Surgeon: Magnus Sinning, MD;  Location: [facility];  Service: Orthopedics;;  partial lateral menisectomy  . PORT-A-CATH REMOVAL N/A 12/21/2017   Procedure: REMOVAL PORT-A-CATH;  Surgeon:  Alphonsa Overall, MD;  Location: [facility];  Service: General;  Laterality: N/A;  . PORTACATH PLACEMENT N/A 09/22/2016   Procedure: INSERTION PORT-A-CATH;  Surgeon: Alphonsa Overall, MD;  Location: [facility];  Service: General;  Laterality: N/A;  . TONSILLECTOMY    . TUBAL LIGATION  AGE 88      Social History:  reports that she quit smoking about 32 years ago. Sandra Wood smoking use included cigarettes. She quit after 5.00 years of use. She has never used smokeless tobacco. She reports current alcohol use. She reports that she does not use drugs. Family History:  Family History  Problem Relation Age of Onset  . Diabetes Mother   . Heart disease Father   . Cancer Brother 49       prostate  . Breast cancer Maternal Grandmother 88     HOME MEDICATIONS: Allergies as of 11/04/2018      Reactions   No Known Allergies       Medication List       Accurate as of Nov 04, 2018 11:59 PM. If you have any questions, ask your nurse or doctor.        anastrozole 1 MG tablet Commonly known as:  ARIMIDEX Take 1 tablet (1 mg total) by mouth daily.   blood glucose meter kit and supplies Kit Dispense based on patient and insurance preference. Use up to four times daily as directed. (FOR ICD-9 250.00, 250.01). Check blood sugar BID.   FreeStyle Libre 14 Day Reader Kerrin Mo 1 Act by Does not apply route daily.   FreeStyle Libre 14 Day Sensor Misc 1 Act by Does not apply route daily.   glucose blood test strip Use as instructed   Insulin Degludec 200 UNIT/ML Sopn Commonly known as:  [place] FlexTouch Inject 50 Units into the skin daily. What changed:  how much to take Changed by:  Dorita Sciara, MD   Insulin Pen Needle 32G X 4 MM Misc Commonly known as:  BD Pen Needle Nano U/F Use to inject insulin daily.   losartan 50 MG tablet Commonly known as:  Cozaar Take 1 tablet (50 mg total) by mouth daily.   metFORMIN 1000 MG tablet Commonly known as:  GLUCOPHAGE Take 1 tablet (1,000 mg total) by mouth 2  (two) times daily with a meal.   nystatin-triamcinolone cream Commonly known as:  MYCOLOG II Apply 1 application topically 2 (two) times daily. Apply to affected area BID for up to 7 days.   rosuvastatin 20 MG tablet Commonly known as:  CRESTOR Take 1 tablet (20 mg total) by mouth daily.   Semaglutide 7 MG Tabs Commonly known as:  Rybelsus Take 1 tablet by mouth daily.        ALLERGIES: Allergies  Allergen Reactions  . No Known  Allergies      REVIEW OF SYSTEMS: A comprehensive ROS was conducted with the patient and is negative except as per HPI and below:  Review of Systems  Constitutional: Positive for weight loss. Negative for fever.  HENT: Negative for congestion and sore throat.   Eyes: Positive for blurred vision. Negative for pain.  Respiratory: Negative for cough and shortness of breath.   Cardiovascular: Negative for chest pain and palpitations.  Gastrointestinal: Positive for diarrhea. Negative for nausea.  Genitourinary: Negative for frequency.  Neurological: Positive for tingling. Negative for tremors.  Endo/Heme/Allergies: Negative for polydipsia.  Psychiatric/Behavioral: Negative for depression. The patient is not nervous/anxious.       OBJECTIVE:   VITAL SIGNS: BP 106/60 (BP Location: Right Wrist, Patient Position: Sitting, Cuff Size: Large)   Pulse 96   Temp 98.4 F (36.9 C) (Oral)   Ht 5' 1.42" (1.56 m)   Wt 291 lb 9.6 oz (132.3 kg)   LMP 06/30/2003   SpO2 96%   BMI 54.35 kg/m    PHYSICAL EXAM:  General: Pt appears well and is in NAD  Hydration: Well-hydrated with moist mucous membranes and good skin turgor  HEENT: Head: Unremarkable with good dentition. Oropharynx clear without exudate.  Eyes: External eye exam normal without stare, lid lag or exophthalmos.  EOM intact.  Neck: General: Supple without adenopathy or carotid bruits. Thyroid: Thyroid size normal.  No goiter or nodules appreciated. No thyroid bruit.  Lungs: Clear with good BS  bilat with no rales, rhonchi, or wheezes  Heart: RRR with normal S1 and S2 and no gallops; no murmurs; no rub  Abdomen: Normoactive bowel sounds, soft, nontender, without masses or organomegaly palpable  Extremities:  Lower extremities - No pretibial edema. No lesions.  Skin: Normal texture and temperature to palpation. No rash noted. No Acanthosis nigricans/skin tags. No lipohypertrophy.  Neuro: MS is good with appropriate affect, pt is alert and Ox3    DM foot exam: 11/04/2018 The skin of the feet is intact without sores or ulcerations.Plantar callous formation noted bilaterally The pedal pulses are 2+ on right and 2+ on left. The sensation is intact to a screening 5.07, 10 gram monofilament bilaterally   DATA REVIEWED:  Lab Results  Component Value Date   HGBA1C 8.8 (A) 11/04/2018   HGBA1C 12.5 (A) 09/13/2018   HGBA1C 9.2 (H) 12/21/2017   Lab Results  Component Value Date   MICROALBUR 2.1 (H) 09/13/2018   CREATININE 0.80 09/13/2018   Lab Results  Component Value Date   MICRALBCREAT 2.3 09/13/2018    Lab Results  Component Value Date   CHOL 237 (H) 09/13/2018   HDL 46.30 09/13/2018   LDLDIRECT 177.0 09/13/2018   TRIG 238.0 (H) 09/13/2018   CHOLHDL 5 09/13/2018        ASSESSMENT / PLAN / RECOMMENDATIONS:   1) Type 2 Diabetes Mellitus, Poorly controlled, With neuropathic complications - Most recent A1c of 8.8 %. Goal A1c < 7.0 %.  Down from 12.5 %  Plan: GENERAL: I have discussed with the patient the pathophysiology of diabetes. We went over the natural progression of the disease. We talked about both insulin resistance and insulin deficiency. We stressed the importance of lifestyle changes including diet and exercise. I explained the complications associated with diabetes including retinopathy, nephropathy, neuropathy as well as increased risk of cardiovascular disease. We went over the benefit seen with glycemic control.    I explained to the patient that diabetic  patients are at higher than normal  risk for amputations. The patient was informed that diabetes is the number one cause of non-traumatic amputations in [place].   Poorly diagnosed diabetes is due to sub-optimal medical management and dietary indiscretions.   Sandra Wood BG's have been for the most part optimal with quite a few tight BG's but no hypoglycemia. We will adjust insulin as below  I will not increase Sandra Wood Rybelsus at this time as she has some issues with bowel movement. Will hold off at this time.   MEDICATIONS: - Decrease to 50 Units daily  - Continue Metformin  1000 mg twice a day with meals - Continue Rybelsus 7 mg daily    EDUCATION / INSTRUCTIONS:  BG monitoring instructions: Patient is instructed to check Sandra Wood blood sugars 2 times a day, fasting and bedtime.  Call [facility] if: BG persistently < 70 or > 300. . I reviewed the Rule of 15 for the treatment of hypoglycemia in detail with the patient. Literature supplied.   2) Diabetic complications:   Eye: Does not have known diabetic retinopathy.   Neuro/ Feet: Does  have known diabetic peripheral neuropathy.  Renal: Patient does not have known baseline CKD. She is on an ACEI/ARB at present.   3) Lipids: Patient was recently started on atorvastatin, LDL was 177 mg/dL    4) Hypertension: She is at goal of < 140/90 mmHg.    F/u in 3 months    Signed electronically by: Mack Guise, MD  [facility]  [address] [place], [place] [address] Phone: [phone] FAX: [phone]   CC: Janith Lima, MD [address] [place] [address] Phone: [phone]  Fax: [phone]    Return to Endocrinology clinic as below: Future Appointments  Date Time Provider [place]  11/11/2018  3:20 PM [facility] NURSE/FLU [facility]  11/15/2018  3:30 PM Christella Hartigan, RD [place] [facility]  11/18/2018  3:20 PM [facility]  NURSE/FLU [facility]  11/25/2018  3:20 PM [facility] NURSE/FLU [facility]  02/01/2019  1:00 PM Shamleffer, Melanie Crazier, MD [facility] None  04/11/2019  8:30 AM Nicholas Lose, MD [facility] None

## 2018-11-04 NOTE — Progress Notes (Signed)
b12 Injection given.   Stacy J Burns, MD  

## 2018-11-11 ENCOUNTER — Other Ambulatory Visit: Payer: Self-pay

## 2018-11-11 ENCOUNTER — Ambulatory Visit (INDEPENDENT_AMBULATORY_CARE_PROVIDER_SITE_OTHER): Payer: No Typology Code available for payment source

## 2018-11-11 DIAGNOSIS — E538 Deficiency of other specified B group vitamins: Secondary | ICD-10-CM | POA: Diagnosis not present

## 2018-11-11 MED ORDER — CYANOCOBALAMIN 1000 MCG/ML IJ SOLN
1000.0000 ug | Freq: Once | INTRAMUSCULAR | Status: AC
  Administered 2018-11-11: 1000 ug via INTRAMUSCULAR

## 2018-11-11 NOTE — Progress Notes (Signed)
Medical screening examination/treatment/procedure(s) were performed by non-physician practitioner and as supervising physician I was immediately available for consultation/collaboration. I agree with above. Letisha Yera, MD   

## 2018-11-15 ENCOUNTER — Other Ambulatory Visit: Payer: Self-pay

## 2018-11-15 ENCOUNTER — Encounter: Payer: No Typology Code available for payment source | Attending: Internal Medicine | Admitting: Registered"

## 2018-11-15 DIAGNOSIS — E1165 Type 2 diabetes mellitus with hyperglycemia: Secondary | ICD-10-CM | POA: Diagnosis present

## 2018-11-15 DIAGNOSIS — IMO0002 Reserved for concepts with insufficient information to code with codable children: Secondary | ICD-10-CM

## 2018-11-15 DIAGNOSIS — E118 Type 2 diabetes mellitus with unspecified complications: Secondary | ICD-10-CM | POA: Diagnosis not present

## 2018-11-15 NOTE — Progress Notes (Signed)
Diabetes Self-Management Education  Visit Type: First/Initial  Appt. Start Time: 1530 Appt. End Time: 1625  11/15/2018  Sandra Wood, identified by name and date of birth, is a 63 y.o. female with a diagnosis of Diabetes: Type 2.   ASSESSMENT  Last menstrual period 06/30/2003. There is no height or weight on file to calculate BMI.   Pt states she is familiar with T2DM due to mother's long-term management of her diabetes. Pt states along with the medications she has made changes to her diet and is careful about amount of carbs she eats throughout the day and checks her Elenor Legato every 8 hours and typically it stays 100-125. At diagnosis A1c was 12.5% and it has been decreasing quickly with latest value of 8.8% 11 days ago.  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 Patient report naps occassionally during day.  Patient states she would like to come back in August after her next MD appointment and will call back to make that appointment.  Diabetes Self-Management Education - 11/15/18 1536      Visit Information   Visit Type  First/Initial      Initial Visit   Diabetes Type  Type 2    Are you currently following a meal plan?  Yes    What type of meal plan do you follow?  low in carbs, sugar eating more fruits    Are you taking your medications as prescribed?  Yes    Date Diagnosed  March 2020      Health Coping   How would you rate your overall health?  Good      Psychosocial Assessment   Patient Belief/Attitude about Diabetes  Motivated to manage diabetes    How often do you need to have someone help you when you read instructions, pamphlets, or other written materials from your doctor or pharmacy?  1 - Never    What is the last grade level you completed in school?  some college      Complications   Last HgB A1C per patient/outside source  8.8 %    How often do you check your blood sugar?  3-4 times/day    Have you had a dilated eye exam in the past 12 months?  No    Have you had a dental exam in the past 12 months?  No    Are you checking your feet?  Yes    How many days per week are you checking your feet?  3      Dietary Intake   Breakfast  cheerios, fruit (berries) OR egg, wheat toast, fruit, sugar free tea or crystal lite    Snack (morning)  none OR berries    Lunch  sliced tomatoes, wheat bread OR toast avocado OR PB & J (sugar free) on one slice    Snack (afternoon)  no OR walnuts or fruit    Dinner  fish 2x week, chicken, pork or [place], vegetables, tries to stay away from potatoes    Snack (evening)  none OR glucerna OR cheese, berries    Beverage(s)  sugar free tea or crystal light, not much water, once in a blue moon sugar-free soda fresca      Exercise   Exercise Type  Light (walking / raking leaves)  How many days per week to you exercise?  1    How many minutes per day do you exercise?  15    Total minutes per week of exercise  15      Patient Education   Previous Diabetes Education  No    Nutrition management   Role of diet in the treatment of diabetes and the relationship between the three main macronutrients and blood glucose level;Reviewed blood glucose goals for pre and post meals and how to evaluate the patients' food intake on their blood glucose level.    Physical activity and exercise   Role of exercise on diabetes management, blood pressure control and cardiac health.    Monitoring  Purpose and frequency of SMBG.    Chronic complications  Relationship between chronic complications and blood glucose control    Psychosocial adjustment  Role of stress on diabetes      Individualized Goals (developed by patient)   Nutrition  General guidelines for healthy choices and portions discussed    Physical Activity  Exercise 1-2 times per week     Monitoring   test my blood glucose as discussed      Outcomes   Expected Outcomes  Demonstrated interest in learning. Expect positive outcomes    Future DMSE  PRN    Program Status  Completed      Individualized Plan for Diabetes Self-Management Training:   Learning Objective:  Patient will have a greater understanding of diabetes self-management. Patient education plan is to attend individual and/or group sessions per assessed needs and concerns.    Patient Instructions  Aim to eat balanced meals and snacks Can try [place] Go (original) mixed with Cheerios (I like multi-grain) to get more protein with your breakfast. Continue with exercise as tolerated, aim for 1 more day per week. Checking BG 2 hours after a meals gives you immediate feedback on how your body handled that meal. Consider contacting your MD if your blood sugar continues to stay in a low range just in case they want to adjust your medication.   Expected Outcomes:  Demonstrated interest in learning. Expect positive outcomes  Education material provided: ADA - How to Thrive: A Guide for Your Journey with Diabetes and A1C conversion sheet  If problems or questions, patient to contact team via:  Phone and Email  Future DSME appointment: PRN

## 2018-11-15 NOTE — Patient Instructions (Signed)
Aim to eat balanced meals and snacks Can try [place] Go (original) mixed with Cheerios (I like multi-grain) to get more protein with your breakfast. Continue with exercise as tolerated, aim for 1 more day per week. Checking BG 2 hours after a meals gives you immediate feedback on how your body handled that meal. Consider contacting your MD if your blood sugar continues to stay in a low range just in case they want to adjust your medication.

## 2018-11-17 ENCOUNTER — Ambulatory Visit (INDEPENDENT_AMBULATORY_CARE_PROVIDER_SITE_OTHER): Payer: No Typology Code available for payment source

## 2018-11-17 DIAGNOSIS — E538 Deficiency of other specified B group vitamins: Secondary | ICD-10-CM

## 2018-11-17 MED ORDER — CYANOCOBALAMIN 1000 MCG/ML IJ SOLN
1000.0000 ug | Freq: Once | INTRAMUSCULAR | Status: AC
  Administered 2018-11-17: 1000 ug via INTRAMUSCULAR

## 2018-11-17 NOTE — Progress Notes (Signed)
I have reviewed and agree.

## 2018-11-18 ENCOUNTER — Ambulatory Visit: Payer: No Typology Code available for payment source

## 2018-11-25 ENCOUNTER — Ambulatory Visit (INDEPENDENT_AMBULATORY_CARE_PROVIDER_SITE_OTHER): Payer: No Typology Code available for payment source

## 2018-11-25 DIAGNOSIS — E538 Deficiency of other specified B group vitamins: Secondary | ICD-10-CM | POA: Diagnosis not present

## 2018-11-25 MED ORDER — CYANOCOBALAMIN 1000 MCG/ML IJ SOLN
1000.0000 ug | Freq: Once | INTRAMUSCULAR | Status: AC
  Administered 2018-11-25: 1000 ug via INTRAMUSCULAR

## 2018-11-25 NOTE — Progress Notes (Signed)
b12 Injection given.   Sandra Wood J Sandra Warshawsky, MD  

## 2018-11-28 ENCOUNTER — Other Ambulatory Visit: Payer: Self-pay

## 2018-11-28 DIAGNOSIS — E1165 Type 2 diabetes mellitus with hyperglycemia: Secondary | ICD-10-CM

## 2018-11-28 DIAGNOSIS — IMO0002 Reserved for concepts with insufficient information to code with codable children: Secondary | ICD-10-CM

## 2018-11-28 MED ORDER — INSULIN DEGLUDEC 200 UNIT/ML ~~LOC~~ SOPN: 50.0000 [IU] | PEN_INJECTOR | Freq: Every day | SUBCUTANEOUS | 1 refills | Status: DC

## 2018-11-30 ENCOUNTER — Other Ambulatory Visit: Payer: Self-pay

## 2018-11-30 ENCOUNTER — Telehealth: Payer: Self-pay | Admitting: Internal Medicine

## 2018-11-30 DIAGNOSIS — E1165 Type 2 diabetes mellitus with hyperglycemia: Secondary | ICD-10-CM

## 2018-11-30 DIAGNOSIS — IMO0002 Reserved for concepts with insufficient information to code with codable children: Secondary | ICD-10-CM

## 2018-11-30 MED ORDER — INSULIN DEGLUDEC 200 UNIT/ML ~~LOC~~ SOPN: 50.0000 [IU] | PEN_INJECTOR | Freq: Every day | SUBCUTANEOUS | 0 refills | Status: DC

## 2018-11-30 NOTE — Telephone Encounter (Signed)
Patient called and is having problems with Tyler Aas and would like a call back at [phone].

## 2018-11-30 NOTE — Telephone Encounter (Signed)
Pt was concerned about cost of [place]. Changed rx to a 90 day supply(45 mL) and suggested that she stop by the office and pick up a co-pay card

## 2018-12-09 LAB — HM DIABETES EYE EXAM

## 2018-12-27 ENCOUNTER — Ambulatory Visit (INDEPENDENT_AMBULATORY_CARE_PROVIDER_SITE_OTHER): Payer: No Typology Code available for payment source

## 2018-12-27 DIAGNOSIS — E538 Deficiency of other specified B group vitamins: Secondary | ICD-10-CM

## 2018-12-27 MED ORDER — CYANOCOBALAMIN 1000 MCG/ML IJ SOLN
1000.0000 ug | Freq: Once | INTRAMUSCULAR | Status: AC
  Administered 2018-12-27: 1000 ug via INTRAMUSCULAR

## 2018-12-27 NOTE — Progress Notes (Signed)
I have reviewed and agree.

## 2019-01-13 ENCOUNTER — Telehealth: Payer: Self-pay | Admitting: Internal Medicine

## 2019-01-13 NOTE — Telephone Encounter (Signed)
Pt would like to know if the office has Semaglutide (RYBELSUS) 7 MG TABS samples? Pt says that her Rx cant be filled until Monday.  Please assist.   [phone]

## 2019-01-13 NOTE — Telephone Encounter (Signed)
Pt informed we do not have any samples of the Rybelsus.

## 2019-01-20 ENCOUNTER — Other Ambulatory Visit: Payer: Self-pay

## 2019-01-24 ENCOUNTER — Other Ambulatory Visit: Payer: Self-pay

## 2019-01-24 ENCOUNTER — Encounter: Payer: Self-pay | Admitting: Obstetrics and Gynecology

## 2019-01-24 ENCOUNTER — Ambulatory Visit: Payer: No Typology Code available for payment source | Admitting: Obstetrics and Gynecology

## 2019-01-24 ENCOUNTER — Telehealth: Payer: Self-pay | Admitting: Obstetrics and Gynecology

## 2019-01-24 ENCOUNTER — Ambulatory Visit: Payer: No Typology Code available for payment source

## 2019-01-24 VITALS — BP 110/60 | HR 92 | Temp 97.0°F | Resp 14 | Ht 61.0 in | Wt 287.0 lb

## 2019-01-24 DIAGNOSIS — Z853 Personal history of malignant neoplasm of breast: Secondary | ICD-10-CM

## 2019-01-24 DIAGNOSIS — B372 Candidiasis of skin and nail: Secondary | ICD-10-CM

## 2019-01-24 DIAGNOSIS — Z01419 Encounter for gynecological examination (general) (routine) without abnormal findings: Secondary | ICD-10-CM | POA: Diagnosis not present

## 2019-01-24 DIAGNOSIS — N644 Mastodynia: Secondary | ICD-10-CM

## 2019-01-24 DIAGNOSIS — N6320 Unspecified lump in the left breast, unspecified quadrant: Secondary | ICD-10-CM

## 2019-01-24 MED ORDER — NYSTATIN 100000 UNIT/GM EX POWD: Freq: Three times a day (TID) | CUTANEOUS | 1 refills | Status: DC

## 2019-01-24 NOTE — Progress Notes (Signed)
63 y.o. G55P2002 Divorced Caucasian female here for annual exam.    She feels a dense place or a knot in her left breast for one month.  She also has pain on this side.  She has a hx of breast cancer.  Due to see Dr. Lindi Adie in October.   No vaginal bleeding.  No vaginal discharge.   Now taking medication for diabetes.  A1C 8.8.  Has heat rash and wants more Nystatin.   She has an appointment with her PCP tomorrow for shingles vaccine and B12 injection and med check.   Caring for 2 grand daughters.  Retired in Jan. [address]91  PCP:  Janith Lima, MD   Patient's last menstrual period was 06/30/2003.           Sexually active: No.  The current method of family planning is tubal ligation and post menopausal status.    Exercising: No.  The patient does not participate in regular exercise at present. Smoker:  Former  Health Maintenance: Pap:  07/27/16 Neg HR HPV Neg  History of abnormal Pap:  yes MMG:  10/24/18 BIRADS 2 benign/density c Colonoscopy:  Never.   BMD:   05/18/17  Result  Osteopenia - oncology following.  TDaP:  2013 HIV: 09/13/18 Negative Hep C: 09/13/18 - Negative. Screening Labs:  PCP   reports that she quit smoking about 32 years ago. Her smoking use included cigarettes. She quit after 5.00 years of use. She has never used smokeless tobacco. She reports previous alcohol use. She reports that she does not use drugs.  Past Medical History:  Diagnosis Date  . Abnormal Pap smear of cervix 2009  . Allergic rhinitis, cause unspecified   . Arthritis   . Arthritis of ankle BILATERAL / HX FX'S  . Asthma, mild persistent   . Breast cancer ([place])   . Dyspnea    with much activity  . Fibroids 2008  . GERD (gastroesophageal reflux disease)    not current  . History of radiation therapy 01/27/17-03/16/17   left breast 1.8 Gy in 28 fractions, left breast boost 2 Gy in 6 fractions  . HSV-2 (herpes simplex virus 2) infection    at age 32  . Malignant neoplasm of lower-inner  quadrant of left breast in female, estrogen receptor positive ([place]) 08/20/2016  . Obesity, morbid ([place])   . Personal history of chemotherapy   . Personal history of radiation therapy   . Pneumonia    2013, 2017  . Tear of medial meniscus of knee LEFT  . Type II or unspecified type diabetes mellitus without mention of complication, uncontrolled    Type II- has never been on medication    Past Surgical History:  Procedure Laterality Date  . BREAST BIOPSY    . BREAST LUMPECTOMY Left    09/22/16  . BREAST LUMPECTOMY WITH RADIOACTIVE SEED AND SENTINEL LYMPH NODE BIOPSY Left 09/22/2016   Procedure: BREAST LUMPECTOMY WITH RADIOACTIVE SEED AND LEFT AXILLARY SENTINEL LYMPH NODE BIOPSY;  Surgeon: Alphonsa Overall, MD;  Location: [facility];  Service: General;  Laterality: Left;  . CHONDROPLASTY  08/19/2011   Procedure: CHONDROPLASTY;  Surgeon: Magnus Sinning, MD;  Location[facility];  Service: Orthopedics;;  shaving of medial femeral chondral  . DILATION AND CURETTAGE OF UTERUS    . HYSTEROSCOPY W/D&C  2008   benign endometrial polyp - Dr. Maryelizabeth Rowan  . KNEE ARTHROSCOPY W/ MENISCECTOMY Bilateral 07/07/2010   1 year apart  . MENISECTOMY  08/19/2011  Procedure: MENISECTOMY;  Surgeon: Magnus Sinning, MD;  Location: [facility];  Service: Orthopedics;;  partial lateral menisectomy  . PORT-A-CATH REMOVAL N/A 12/21/2017   Procedure: REMOVAL PORT-A-CATH;  Surgeon: Alphonsa Overall, MD;  Location: [place];  Service: General;  Laterality: N/A;  . PORTACATH PLACEMENT N/A 09/22/2016   Procedure: INSERTION PORT-A-CATH;  Surgeon: Alphonsa Overall, MD;  Location: [place];  Service: General;  Laterality: N/A;  . TONSILLECTOMY    . TUBAL LIGATION  AGE 37    Current Outpatient Medications  Medication Sig Dispense Refill  . anastrozole (ARIMIDEX) 1 MG tablet Take 1 tablet (1 mg total) by mouth daily. 90 tablet 3  . blood glucose meter kit and supplies KIT Dispense based on patient and insurance  preference. Use up to four times daily as directed. (FOR ICD-9 250.00, 250.01). Check blood sugar BID. 1 each 0  . Continuous Blood Gluc Receiver (FREESTYLE LIBRE 14 DAY READER) DEVI 1 Act by Does not apply route daily. 1 Device 11  . Continuous Blood Gluc Sensor (FREESTYLE LIBRE 14 DAY SENSOR) MISC 1 Act by Does not apply route daily. 1 each 11  . cyanocobalamin 100 MCG tablet Take 100 mcg by mouth daily.    . Cyanocobalamin 1000 MCG/ML KIT Inject as directed.    Marland Kitchen glucose blood test strip Use as instructed 100 each 12  . Insulin Degludec (TRESIBA FLEXTOUCH) 200 UNIT/ML SOPN Inject 50 Units into the skin daily. 45 mL 0  . Insulin Pen Needle (BD PEN NEEDLE NANO U/F) 32G X 4 MM MISC Use to inject insulin daily. 90 each 1  . losartan (COZAAR) 50 MG tablet Take 1 tablet (50 mg total) by mouth daily. 90 tablet 1  . metFORMIN (GLUCOPHAGE) 1000 MG tablet Take 1 tablet (1,000 mg total) by mouth 2 (two) times daily with a meal. 180 tablet 1  . rosuvastatin (CRESTOR) 20 MG tablet Take 1 tablet (20 mg total) by mouth daily. 90 tablet 1  . Semaglutide (RYBELSUS) 7 MG TABS Take 1 tablet by mouth daily. 90 tablet 1  . nystatin-triamcinolone (MYCOLOG II) cream Apply 1 application topically 2 (two) times daily. Apply to affected area BID for up to 7 days. (Patient not taking: Reported on 01/24/2019) 60 g 0   No current facility-administered medications for this visit.     Family History  Problem Relation Age of Onset  . Diabetes Mother   . Heart disease Father   . Cancer Brother 61       prostate  . Breast cancer Maternal Grandmother 66    Review of Systems  Constitutional: Negative.   HENT: Negative.   Eyes: Negative.   Respiratory: Negative.   Cardiovascular: Negative.   Gastrointestinal: Negative.   Endocrine: Negative.   Genitourinary: Negative.   Musculoskeletal: Negative.   Skin: Negative.   Allergic/Immunologic: Negative.   Neurological: Negative.   Hematological: Negative.    Psychiatric/Behavioral: Negative.     Exam:   BP 110/60   Pulse 92   Temp (!) 97 F (36.1 C) (Temporal)   Resp 14   Ht 5' 1" (1.549 m)   Wt 287 lb (130.2 kg)   LMP 06/30/2003   BMI 54.23 kg/m     General appearance: alert, cooperative and appears stated age Head: normocephalic, without obvious abnormality, atraumatic Neck: no adenopathy, supple, symmetrical, trachea midline and thyroid normal to inspection and palpation Lungs: clear to auscultation bilaterally Breasts: right - normal appearance, no masses. Tenderness present right lateral margin, No  nipple retraction or dimpling, No nipple discharge or bleeding, No axillary adenopathy Left breast - 4 mm nodule at 1:00.  No tenderness.  No nipple discharge or bleeding.  No axillary adenopathy.     Heart: regular rate and rhythm Abdomen: obese, soft, non-tender; no masses, no organomegaly Extremities: extremities normal, atraumatic, no cyanosis or edema Skin: skin color, texture, turgor normal.  Erythema under pannus.  Lymph nodes: cervical, supraclavicular, and axillary nodes normal. Neurologic: grossly normal  Pelvic: External genitalia:  no lesions              No abnormal inguinal nodes palpated.              Urethra:  normal appearing urethra with no masses, tenderness or lesions              Bartholins and Skenes: normal                 Vagina: normal appearing vagina with normal color and discharge, no lesions              Cervix: no lesions              Pap taken: No. Bimanual Exam:  Uterus:  normal size, contour, position, consistency, mobility, non-tender              Adnexa: no mass, fullness, tenderness              Rectal exam: Yes.  .  Confirms.              Anus:  normal sphincter tone, no lesions  Chaperone was present for exam.  Assessment:   Well woman visit with gynecologic exam. Left breast cancer.  Status post lumpectomy, XRT and chemotherapy.  On Arimidex. Left breast mass and right breast pain.   DM.  Hx HSV. Osteopenia.  Candida of flexural folds.   Plan: Dx bilateral mammogram and bilateral breast US.  Self breast awareness reviewed. Pap and HR HPV as above. Guidelines for Calcium, Vitamin D, regular exercise program including cardiovascular and weight bearing exercise. Nystatin powder.  BMD per oncology.  Follow up annually and prn.   After visit summary provided.

## 2019-01-24 NOTE — Telephone Encounter (Signed)
Patient notified of appt. Patient is agreeable to date and time.   Routing to provider for final review. Patient is agreeable to disposition. Will close encounter.

## 2019-01-24 NOTE — Telephone Encounter (Signed)
Please contact patient to schedule a diagnostic bilateral mammogram and [facility].  She has a history of left breast cancer.  She found a lump at 1:00 of the left breast and I confirmed this today.  She also has right lateral breast pain.

## 2019-01-24 NOTE — Patient Instructions (Signed)

## 2019-01-24 NOTE — Telephone Encounter (Signed)
Spoke with Lilia Pro at [facility]. Bilateral Dx MMG and L/R breast US, if needed, scheduled  on 8/5 arriving at 8:40am, 9am appt.

## 2019-01-25 ENCOUNTER — Ambulatory Visit (INDEPENDENT_AMBULATORY_CARE_PROVIDER_SITE_OTHER): Payer: No Typology Code available for payment source

## 2019-01-25 DIAGNOSIS — E538 Deficiency of other specified B group vitamins: Secondary | ICD-10-CM | POA: Diagnosis not present

## 2019-01-25 DIAGNOSIS — Z23 Encounter for immunization: Secondary | ICD-10-CM

## 2019-01-25 MED ORDER — CYANOCOBALAMIN 1000 MCG/ML IJ SOLN
1000.0000 ug | Freq: Once | INTRAMUSCULAR | Status: AC
  Administered 2019-01-25: 1000 ug via INTRAMUSCULAR

## 2019-01-25 NOTE — Progress Notes (Addendum)
I have reviewed and agree.

## 2019-01-25 NOTE — Addendum Note (Signed)
Addended by: Ander Slade on: 01/25/2019 02:12 PM   Modules accepted: Orders

## 2019-01-27 ENCOUNTER — Other Ambulatory Visit: Payer: Self-pay

## 2019-02-01 ENCOUNTER — Encounter: Payer: Self-pay | Admitting: Internal Medicine

## 2019-02-01 ENCOUNTER — Ambulatory Visit
Admission: RE | Admit: 2019-02-01 | Discharge: 2019-02-01 | Disposition: A | Discharge status: Home / Self Care | Payer: No Typology Code available for payment source | Source: Ambulatory Visit | Attending: Obstetrics and Gynecology | Admitting: Obstetrics and Gynecology

## 2019-02-01 ENCOUNTER — Ambulatory Visit: Payer: No Typology Code available for payment source | Admitting: Internal Medicine

## 2019-02-01 ENCOUNTER — Other Ambulatory Visit: Payer: Self-pay

## 2019-02-01 VITALS — BP 124/58 | HR 93 | Temp 98.3°F | Ht 61.0 in | Wt 287.0 lb

## 2019-02-01 DIAGNOSIS — Z853 Personal history of malignant neoplasm of breast: Secondary | ICD-10-CM

## 2019-02-01 DIAGNOSIS — N644 Mastodynia: Secondary | ICD-10-CM

## 2019-02-01 DIAGNOSIS — E1165 Type 2 diabetes mellitus with hyperglycemia: Secondary | ICD-10-CM

## 2019-02-01 DIAGNOSIS — E1142 Type 2 diabetes mellitus with diabetic polyneuropathy: Secondary | ICD-10-CM | POA: Diagnosis not present

## 2019-02-01 DIAGNOSIS — N6320 Unspecified lump in the left breast, unspecified quadrant: Secondary | ICD-10-CM

## 2019-02-01 DIAGNOSIS — IMO0002 Reserved for concepts with insufficient information to code with codable children: Secondary | ICD-10-CM

## 2019-02-01 DIAGNOSIS — E118 Type 2 diabetes mellitus with unspecified complications: Secondary | ICD-10-CM | POA: Diagnosis not present

## 2019-02-01 LAB — POCT GLYCOSYLATED HEMOGLOBIN (HGB A1C): Hemoglobin A1C: 6.4 % — AB (ref 4.0–5.6)

## 2019-02-01 LAB — GLUCOSE, POCT (MANUAL RESULT ENTRY): POC Glucose: 101 mg/dl — AB (ref 70–99)

## 2019-02-01 MED ORDER — TRESIBA FLEXTOUCH 200 UNIT/ML ~~LOC~~ SOPN: 40.0000 [IU] | PEN_INJECTOR | Freq: Every day | SUBCUTANEOUS | 1 refills | Status: DC

## 2019-02-01 NOTE — Patient Instructions (Addendum)
-   Decrease tresiba  to 40 Units daily  - Continue Metformin  1000 mg twice a day with meals - Continue Rybelsus 7 mg daily    - If your sugars are consistently below 90 , please reduce tresiba to 30 units daily  Choose healthy, lower carb lower calorie snacks: toss salad, cooked vegetables, cottage cheese, peanut butter, low fat cheese / string cheese, lower sodium deli meat, tuna salad or chicken salad    HOW TO TREAT LOW BLOOD SUGARS (Blood sugar LESS THAN 70 MG/DL)  Please follow the RULE OF 15 for the treatment of hypoglycemia treatment (when your (blood sugars are less than 70 mg/dL)    STEP 1: Take 15 grams of carbohydrates when your blood sugar is low, which includes:   3-4 GLUCOSE TABS  OR  3-4 OZ OF JUICE OR REGULAR SODA OR  ONE TUBE OF GLUCOSE GEL     STEP 2: RECHECK blood sugar in 15 MINUTES STEP 3: If your blood sugar is still low at the 15 minute recheck --> then, go back to STEP 1 and treat AGAIN with another 15 grams of carbohydrates.

## 2019-02-01 NOTE — Progress Notes (Signed)
Name: Sandra Wood  Age/ Sex: 63 y.o., female   MRN/ DOB: [id number], 1955/11/03     PCP: Janith Lima, MD   Reason for Endocrinology Evaluation: Type 2 Diabetes Mellitus  Initial Endocrine Consultative Visit: 11/07/2018    PATIENT IDENTIFIER: Sandra Wood is a 64 y.o. female with a past medical history of HTN, T2DM, Asthma, Hx of Breast Ca (2018). The patient has followed with Endocrinology clinic since 11/07/2018 for consultative assistance with management of her diabetes.  DIABETIC HISTORY:  Sandra Wood was diagnosed with T2DM in 2013. Pt was on diet control until 08/2018 when she was started on Metformin, Rybelsus and Insulin. Her hemoglobin A1c has ranged from 6.6% in 2013, peaking at 12.5% in 2020  On her initial visit to our clinic her A1c was 8.8 % . She was on Rybelsus, tresiba and Metformin  SUBJECTIVE:   During the last visit (11/07/2018): Decreased Tresiba to 50 units, continued Metformin and Rybelsus   Today (02/01/2019): Sandra Wood is here for a 3 months follow up on her diabetes management.  She checks her blood sugars multiple  times daily.  The patient has  had hypoglycemic episodes since the last clinic visit, which typically occur multiple times daily . The patient is not symptomatic with these episodes.  Otherwise, the patient has not required any recent emergency interventions for hypoglycemia and has not had recent hospitalizations secondary to hyper or hypoglycemic episodes.    ROS: As per HPI and as detailed below: Review of Systems  Cardiovascular: Negative for chest pain and leg swelling.  Gastrointestinal: Positive for diarrhea. Negative for nausea.       Diarrhea with dairy   Genitourinary: Negative for frequency.  Endo/Heme/Allergies: Negative for polydipsia.      HOME DIABETES REGIMEN:  Tresiba 50 units daily  Metformin 1000 mg BID Rybelsus 7 mg daily     CONTINUOUS GLUCOSE MONITORING RECORD INTERPRETATION    Dates of Recording:  7/22-01/31/2019  Sensor description:Freestyle   Results statistics:   CGM use % of time 70  Average and SD 94/   Time in range    84    %  % Time Above 180 0  % Time above 250 0  % Time Below target 9    Glycemic patterns summary: Within goal most of the time   Hyperglycemic episodes  N/A  Hypoglycemic episodes occurred early morning and sometimes during the day   Overnight periods: hypoglycemia          HISTORY:  Past Medical History:  Past Medical History:  Diagnosis Date  . Abnormal Pap smear of cervix 2009  . Allergic rhinitis, cause unspecified   . Arthritis   . Arthritis of ankle BILATERAL / HX FX'S  . Asthma, mild persistent   . Breast cancer ([place])   . Dyspnea    with much activity  . Fibroids 2008  . GERD (gastroesophageal reflux disease)    not current  . History of radiation therapy 01/27/17-03/16/17   left breast 1.8 Gy in 28 fractions, left breast boost 2 Gy in 6 fractions  . HSV-2 (herpes simplex virus 2) infection    at age 69  . Malignant neoplasm of lower-inner quadrant of left breast in female, estrogen receptor positive ([place]) 08/20/2016  . Obesity, morbid ([place])   . Personal history of chemotherapy   . Personal history of radiation therapy   . Pneumonia    2013, 2017  . Tear of medial meniscus of  knee LEFT  . Type II or unspecified type diabetes mellitus without mention of complication, uncontrolled    Type II- has never been on medication   Past Surgical History:  Past Surgical History:  Procedure Laterality Date  . BREAST BIOPSY    . BREAST LUMPECTOMY Left 09/22/2016  . BREAST LUMPECTOMY WITH RADIOACTIVE SEED AND SENTINEL LYMPH NODE BIOPSY Left 09/22/2016   Procedure: BREAST LUMPECTOMY WITH RADIOACTIVE SEED AND LEFT AXILLARY SENTINEL LYMPH NODE BIOPSY;  Surgeon: Alphonsa Overall, MD;  Location: [facility];  Service: General;  Laterality: Left;  . CHONDROPLASTY   08/19/2011   Procedure: CHONDROPLASTY;  Surgeon: Magnus Sinning, MD;  Location: [facility];  Service: Orthopedics;;  shaving of medial femeral chondral  . DILATION AND CURETTAGE OF UTERUS    . HYSTEROSCOPY W/D&C  2008   benign endometrial polyp - Dr. Maryelizabeth Rowan  . KNEE ARTHROSCOPY W/ MENISCECTOMY Bilateral 07/07/2010   1 year apart  . MENISECTOMY  08/19/2011   Procedure: MENISECTOMY;  Surgeon: Magnus Sinning, MD;  [facility];  Service: Orthopedics;;  partial lateral menisectomy  . PORT-A-CATH REMOVAL N/A 12/21/2017   Procedure: REMOVAL PORT-A-CATH;  Surgeon: Alphonsa Overall, MD;  Location: [place];  Service: General;  Laterality: N/A;  . PORTACATH PLACEMENT N/A 09/22/2016   Procedure: INSERTION PORT-A-CATH;  Surgeon: Alphonsa Overall, MD;  Location: [place];  Service: General;  Laterality: N/A;  . TONSILLECTOMY    . TUBAL LIGATION  AGE 42    Social History:  reports that she quit smoking about 32 years ago. Her smoking use included cigarettes. She quit after 5.00 years of use. She has never used smokeless tobacco. She reports previous alcohol use. She reports that she does not use drugs. Family History:  Family History  Problem Relation Age of Onset  . Diabetes Mother   . Heart disease Father   . Cancer Brother 47       prostate  . Breast cancer Maternal Grandmother 88     HOME MEDICATIONS: Allergies as of 02/01/2019      Reactions   No Known Allergies       Medication List       Accurate as of February 01, 2019  3:05 PM. If you have any questions, ask your nurse or doctor.        anastrozole 1 MG tablet Commonly known as: ARIMIDEX Take 1 tablet (1 mg total) by mouth daily.   blood glucose meter kit and supplies Kit Dispense based on patient and insurance preference. Use up to four times daily as directed. (FOR ICD-9 250.00, 250.01). Check blood sugar BID.   cyanocobalamin 100 MCG tablet Take 100 mcg by mouth daily.   Cyanocobalamin 1000  MCG/ML Kit Inject as directed.   FreeStyle Libre 14 Day Reader Kerrin Mo 1 Act by Does not apply route daily.   FreeStyle Libre 14 Day Sensor Misc 1 Act by Does not apply route daily.   glucose blood test strip Use as instructed   Insulin Pen Needle 32G X 4 MM Misc Commonly known as: BD Pen Needle Nano U/F Use to inject insulin daily.   losartan 50 MG tablet Commonly known as: Cozaar Take 1 tablet (50 mg total) by mouth daily.   metFORMIN 1000 MG tablet Commonly known as: GLUCOPHAGE Take 1 tablet (1,000 mg total) by mouth 2 (two) times daily with a meal.   nystatin powder Commonly known as: MYCOSTATIN/NYSTOP Apply topically 3 (three) times daily. Apply to affected area  for up to 7 days   nystatin-triamcinolone cream Commonly known as: MYCOLOG II Apply 1 application topically 2 (two) times daily. Apply to affected area BID for up to 7 days.   rosuvastatin 20 MG tablet Commonly known as: CRESTOR Take 1 tablet (20 mg total) by mouth daily.   Semaglutide 7 MG Tabs Commonly known as: Rybelsus Take 1 tablet by mouth daily.   Tyler Aas FlexTouch 200 UNIT/ML Sopn Generic drug: Insulin Degludec Inject 40 Units into the skin daily. What changed: how much to take Changed by: Dorita Sciara, MD        OBJECTIVE:   Vital Signs: BP (!) 124/58 (BP Location: Right Arm, Patient Position: Sitting, Cuff Size: Large)   Pulse 93   Temp 98.3 F (36.8 C)   Ht '5\' 1"'$  (1.549 m)   Wt 287 lb (130.2 kg)   LMP 06/30/2003   SpO2 94%   BMI 54.23 kg/m   Wt Readings from Last 3 Encounters:  02/01/19 287 lb (130.2 kg)  01/24/19 287 lb (130.2 kg)  11/04/18 291 lb 9.6 oz (132.3 kg)     Exam: General: Pt appears well and is in NAD  Lungs: Clear with good BS bilat with no rales, rhonchi, or wheezes  Heart: RRR with normal S1 and S2 and no gallops; no murmurs; no rub  Abdomen: Normoactive bowel sounds, soft, nontender, without masses or organomegaly palpable  Extremities: No  pretibial edema. No tremor.  Skin: Normal texture and temperature to palpation. No rash noted.   Neuro: MS is good with appropriate affect, pt is alert and Ox3       DM foot exam: 11/04/2018 The skin of the feet is intact without sores or ulcerations.Plantar callous formation noted bilaterally The pedal pulses are 2+ on right and 2+ on left. The sensation is intact to a screening 5.07, 10 gram monofilament bilaterally       DATA REVIEWED:  Lab Results  Component Value Date   HGBA1C 6.4 (A) 02/01/2019   HGBA1C 8.8 (A) 11/04/2018   HGBA1C 12.5 (A) 09/13/2018   Lab Results  Component Value Date   MICROALBUR 2.1 (H) 09/13/2018   CREATININE 0.80 09/13/2018   Lab Results  Component Value Date   MICRALBCREAT 2.3 09/13/2018     Lab Results  Component Value Date   CHOL 237 (H) 09/13/2018   HDL 46.30 09/13/2018   LDLDIRECT 177.0 09/13/2018   TRIG 238.0 (H) 09/13/2018   CHOLHDL 5 09/13/2018         ASSESSMENT / PLAN / RECOMMENDATIONS:   1) Type 2 Diabetes Mellitus, Poorly controlled, With Neuropathic complications - Most recent A1c of 6.4 %. Goal A1c < 7.0 %.    - Praised the patient on lifestyle changes, glucose checks and medication compliance - Glycemic control has improved drastically but unfortunately despite reducing the insulin dose, she continues with hypoglycemia episodes, will reduce tresiba dose as below.  - I will not increase Rybelsus yet, she she has occasional nausea and diarrhea that she attributes to metformin in conjunction with dairy.  - Pt advised to contact us if sugars are consistently below 90 mg/dL.  - Pt also advised to confirm low glucose with a fingerstick due to discrepancy between CGM and fingerstick.      MEDICATIONS: - Decrease tresiba  to 40 Units daily  - Continue Metformin  1000 mg twice a day with meals - Continue Rybelsus 7 mg daily    EDUCATION / INSTRUCTIONS:  BG monitoring instructions: Patient  is instructed to check her blood  sugars 2 times a day, fasting and bedtime.  Call [place] if: BG persistently < 70 or > 300. . I reviewed the Rule of 15 for the treatment of hypoglycemia in detail with the patient. Literature supplied.      F/U in 3 months    Signed electronically by: Mack Guise, MD  [facility]  [facility] [address] [place], [place] [address] Phone: [phone] FAX: [phone]   CC: Janith Lima, MD [address] [place] [address] Phone: [phone]  Fax: [phone]  Return to Endocrinology clinic as below: Future Appointments  Date Time Provider [place]  04/11/2019  8:30 AM Nicholas Lose, MD [facility] None  05/08/2019  1:00 PM Shoua Ressler, Melanie Crazier, MD [facility] None  01/25/2020  3:00 PM Amundson Raliegh Ip, MD [place] None

## 2019-02-03 ENCOUNTER — Telehealth: Payer: Self-pay | Admitting: *Deleted

## 2019-02-03 NOTE — Telephone Encounter (Signed)
Left detailed message on nurse line requesting return call to office or patient directly to schedule f/u with Dr. Lindi Adie for left breast mass, bilateral DX MMG normal. Patient is scheduled for 04/11/19 OV, Dr. Quincy Simmonds request f/u prior to this appt. Return call to [place], [place] at [facility] at [phone].

## 2019-02-03 NOTE — Telephone Encounter (Signed)
Patient is scheduled with Dr. Lindi Adie on 02/10/19 at 10:30am. Patient notified.   Routing to provider for final review. Patient is agreeable to disposition. Will close encounter.

## 2019-02-03 NOTE — Telephone Encounter (Signed)
-----   Message from Nunzio Cobbs, MD sent at 02/01/2019 12:53 PM EDT ----- Please contact patient in follow up to her recent dx bilateral mammogram and bilateral breast US.  The radiologist reported everything is normal.  I would like for her to see Dr. Lindi Adie, her breast oncologist, for a follow up visit prior to her October appointment.  She and I both felt a mass in her left breast.   Continue in mammogram hold until after her appointment with Dr. Lindi Adie.

## 2019-02-05 NOTE — Telephone Encounter (Signed)
Please keep in mammogram hold until the appointment with Dr. Lindi Adie.

## 2019-02-06 NOTE — Telephone Encounter (Signed)
Continued MMG hold.

## 2019-02-09 NOTE — Progress Notes (Signed)
Patient Care Team: Janith Lima, MD as PCP - General (Internal Medicine) Alphonsa Overall, MD as Consulting Physician (General Surgery) Nicholas Lose, MD as Consulting Physician (Hematology and Oncology) Eppie Gibson, MD as Attending Physician (Radiation Oncology)  DIAGNOSIS:    ICD-10-CM   1. Malignant neoplasm of lower-inner quadrant of left breast in female, estrogen receptor positive ([place])  C50.312    Z17.0     SUMMARY OF ONCOLOGIC HISTORY: Oncology History  Malignant neoplasm of lower-inner quadrant of left breast in female, estrogen receptor positive ([place])  08/18/2016 Initial Diagnosis   Left breast biopsy 8:00 retroareolar position: IDC, high-grade with DCIS high-grade, ER 90%, PR 95%, Ki-67 20%, HER-2 positive ratio 3.4; screening detected left breast distortion LIQ 8 8:00 retroareolar 0.8 cm, axilla negative, T1b N0 stage IA clinical stage   09/22/2016 Surgery   Left lumpectomy: IDC grade 3, 1.3 cm, DCIS high-grade, margins clear, 0/1 lymph node negative, T1CN 0 stage IA, ER 90%, PR 95%, Ki-67 20%, HER-2 positive ratio 3.4   10/19/2016 - 01/04/2017 Chemotherapy   Taxol Herceptin weekly 12 followed by Herceptin maintenance every 3 weeks for 1 year    01/27/2017 - 03/16/2017 Radiation Therapy   Adjuvant radiation therapy   05/10/2017 -  Anti-estrogen oral therapy   Anastrozole 1 mg p.o. daily     CHIEF COMPLIANT: Follow-up on anastrozole therapy  INTERVAL HISTORY: Sandra Wood is a 63 y.o. with above-mentioned history of left breast cancer treated with lumpectomy, adjuvant chemotherapy, radiation, and who is currently on anti-estrogen therapy with anastrozole. I last saw her 10 months ago. Mammogram and [place] on 02/01/19 showed no evidence of malignancy bilaterally. She presents to the clinic today for follow-up.   REVIEW OF SYSTEMS:   Constitutional: Denies fevers, chills or abnormal weight loss Eyes: Denies blurriness of vision Ears, nose, mouth, throat, and face:  Denies mucositis or sore throat Respiratory: Denies cough, dyspnea or wheezes Cardiovascular: Denies palpitation, chest discomfort Gastrointestinal: Denies nausea, heartburn or change in bowel habits Skin: Denies abnormal skin rashes Lymphatics: Denies new lymphadenopathy or easy bruising Neurological: Denies numbness, tingling or new weaknesses Behavioral/Psych: Mood is stable, no new changes  Extremities: No lower extremity edema Breast: denies any pain or lumps or nodules in either breasts, tenderness in bilateral breast especially in the left breast. All other systems were reviewed with the patient and are negative.  I have reviewed the past medical history, past surgical history, social history and family history with the patient and they are unchanged from previous note.  ALLERGIES:  is allergic to no known allergies.  MEDICATIONS:  Current Outpatient Medications  Medication Sig Dispense Refill  . anastrozole (ARIMIDEX) 1 MG tablet Take 1 tablet (1 mg total) by mouth daily. 90 tablet 3  . blood glucose meter kit and supplies KIT Dispense based on patient and insurance preference. Use up to four times daily as directed. (FOR ICD-9 250.00, 250.01). Check blood sugar BID. 1 each 0  . Continuous Blood Gluc Receiver (FREESTYLE LIBRE 14 DAY READER) DEVI 1 Act by Does not apply route daily. 1 Device 11  . Continuous Blood Gluc Sensor (FREESTYLE LIBRE 14 DAY SENSOR) MISC 1 Act by Does not apply route daily. 1 each 11  . cyanocobalamin 100 MCG tablet Take 100 mcg by mouth daily.    . Cyanocobalamin 1000 MCG/ML KIT Inject as directed.    Marland Kitchen glucose blood test strip Use as instructed 100 each 12  . Insulin Degludec (TRESIBA FLEXTOUCH) 200 UNIT/ML SOPN Inject  40 Units into the skin daily. 45 mL 1  . Insulin Pen Needle (BD PEN NEEDLE NANO U/F) 32G X 4 MM MISC Use to inject insulin daily. 90 each 1  . losartan (COZAAR) 50 MG tablet Take 1 tablet (50 mg total) by mouth daily. 90 tablet 1  .  metFORMIN (GLUCOPHAGE) 1000 MG tablet Take 1 tablet (1,000 mg total) by mouth 2 (two) times daily with a meal. 180 tablet 1  . nystatin (MYCOSTATIN/NYSTOP) powder Apply topically 3 (three) times daily. Apply to affected area for up to 7 days 45 g 1  . nystatin-triamcinolone (MYCOLOG II) cream Apply 1 application topically 2 (two) times daily. Apply to affected area BID for up to 7 days. (Patient not taking: Reported on 01/24/2019) 60 g 0  . rosuvastatin (CRESTOR) 20 MG tablet Take 1 tablet (20 mg total) by mouth daily. 90 tablet 1  . Semaglutide (RYBELSUS) 7 MG TABS Take 1 tablet by mouth daily. 90 tablet 1   No current facility-administered medications for this visit.     PHYSICAL EXAMINATION: ECOG PERFORMANCE STATUS: 1 - Symptomatic but completely ambulatory  Vitals:   02/10/19 1014  BP: 123/68  Pulse: 82  Resp: 18  Temp: 98.2 F (36.8 C)  SpO2: 99%   Filed Weights   02/10/19 1014  Weight: 287 lb 14.4 oz (130.6 kg)    GENERAL: alert, no distress and comfortable SKIN: skin color, texture, turgor are normal, no rashes or significant lesions EYES: normal, Conjunctiva are pink and non-injected, sclera clear OROPHARYNX: no exudate, no erythema and lips, buccal mucosa, and tongue normal  NECK: supple, thyroid normal size, non-tender, without nodularity LYMPH: no palpable lymphadenopathy in the cervical, axillary or inguinal LUNGS: clear to auscultation and percussion with normal breathing effort HEART: regular rate & rhythm and no murmurs and no lower extremity edema ABDOMEN: abdomen soft, non-tender and normal bowel sounds MUSCULOSKELETAL: no cyanosis of digits and no clubbing  NEURO: alert & oriented x 3 with fluent speech, no focal motor/sensory deficits EXTREMITIES: No lower extremity edema BREAST: No palpable masses or nodules in either right or left breasts. No palpable axillary supraclavicular or infraclavicular adenopathy no breast tenderness or nipple discharge.  Bilateral  breast tenderness is palpated.  (exam performed in the presence of a chaperone)   LABORATORY DATA:  I have reviewed the data as listed CMP Latest Ref Rng & Units 09/13/2018 12/21/2017 10/04/2017  Glucose 70 - 99 mg/dL 385(H) 238(H) 167(H)  BUN 6 - 23 mg/dL _0 Creatinine 0.40 - 1.20 mg/dL 0.80 0.74 0.75  Sodium 135 - 145 mEq/L 137 140 140  Potassium 3.5 - 5.1 mEq/L 4.2 4.0 3.9  Chloride 96 - 112 mEq/L 102 105 103  CO2 19 - 32 mEq/L _1 Calcium 8.4 - 10.5 mg/dL 9.6 9.2 9.8  Total Protein 6.0 - 8.3 g/dL 8.2 - 7.6  Total Bilirubin 0.2 - 1.2 mg/dL 0.5 - 0.5  Alkaline Phos 39 - 117 U/L 160(H) - 150  AST 0 - 37 U/L 14 - 13  ALT 0 - 35 U/L 17 - 19    Lab Results  Component Value Date   WBC 13.0 (H) 09/13/2018   HGB 15.4 (H) 09/13/2018   HCT 48.1 (H) 09/13/2018   MCV 86.6 09/13/2018   PLT 359.0 09/13/2018   NEUTROABS 9.1 (H) 09/13/2018    ASSESSMENT & PLAN:  Malignant neoplasm of lower-inner quadrant of left breast in female, estrogen receptor positive ([facility]) Left breast biopsy02/20/2018:8:00  retroareolar position: IDC, high-grade with DCIS high-grade, ER 90%, PR 95%, Ki-67 20%, HER-2 positive ratio 3.4; screening detected left breast distortion LIQ 8 8:00 retroareolar 0.8 cm, axilla negative, T1b N0 stage IA clinical stage Left lumpectomy 09/22/2016: IDC grade 3, 1.3 cm, DCIS high-grade, margins clear, 0/1 lymph node negative, T1 CN 0 stage IA, ER 90%, PR 95%, Ki-67 20%, HER-2 positive ratio 3.4  Treatment summary: 1. Adjuvant chemotherapy and Herceptin with weekly Taxol and Herceptin 12 completed 01/04/2017 followed by Herceptin maintenance for 1 year completed 10/04/2017 3.Adjuvant radiation8/06/2016-03/16/2017 4.Adjuvant antiestrogen therapy with letrozole 2.5 mg daily 5 yearsstarted 05/10/2017 ----------------------------------------------------------------------------- Diabetes:No change in the current plan  Anastrozoletoxicities: Intermittent hot flashes  Denies any arthralgias or myalgias  I briefly discussed with her about the role of Neratinib Patient is however not interested in it because of the diarrhea concern.  Breast cancer surveillance: 1.  Mammogram and ultrasound 01/31/2019: Benign, ultrasound was done to evaluate nonfocal bilateral breast pain as well as the left palpable area of concern. 2. breast exam 02/10/2019: Small palpable nodule at 12 o'clock position below the scar for the port on the right side.  We will obtain a mammogram and ultrasound of the right breast.  Return to clinic in 1 year for follow-up.  I sent a prescription refill for anastrozole.    No orders of the defined types were placed in this encounter.  The patient has a good understanding of the overall plan. she agrees with it. she will call with any problems that may develop before the next visit here.  Nicholas Lose, MD 02/10/2019  Julious Oka Dorshimer am acting as scribe for Dr. Nicholas Lose.  I have reviewed the above documentation for accuracy and completeness, and I agree with the above.

## 2019-02-10 ENCOUNTER — Telehealth: Payer: Self-pay | Admitting: Hematology and Oncology

## 2019-02-10 ENCOUNTER — Other Ambulatory Visit: Payer: Self-pay

## 2019-02-10 ENCOUNTER — Inpatient Hospital Stay
Payer: No Typology Code available for payment source | Attending: Hematology and Oncology | Admitting: Hematology and Oncology

## 2019-02-10 DIAGNOSIS — E119 Type 2 diabetes mellitus without complications: Secondary | ICD-10-CM | POA: Insufficient documentation

## 2019-02-10 DIAGNOSIS — Z794 Long term (current) use of insulin: Secondary | ICD-10-CM | POA: Insufficient documentation

## 2019-02-10 DIAGNOSIS — Z79899 Other long term (current) drug therapy: Secondary | ICD-10-CM | POA: Diagnosis not present

## 2019-02-10 DIAGNOSIS — C50312 Malignant neoplasm of lower-inner quadrant of left female breast: Secondary | ICD-10-CM | POA: Insufficient documentation

## 2019-02-10 DIAGNOSIS — Z79811 Long term (current) use of aromatase inhibitors: Secondary | ICD-10-CM | POA: Diagnosis not present

## 2019-02-10 DIAGNOSIS — N951 Menopausal and female climacteric states: Secondary | ICD-10-CM | POA: Diagnosis not present

## 2019-02-10 DIAGNOSIS — Z170 Estrogen receptor positive status [ER+]: Secondary | ICD-10-CM | POA: Insufficient documentation

## 2019-02-10 MED ORDER — ANASTROZOLE 1 MG PO TABS: 1.0000 mg | ORAL_TABLET | Freq: Every day | ORAL | 3 refills | Status: DC

## 2019-02-10 NOTE — Assessment & Plan Note (Signed)
Left breast biopsy02/20/2018:8:00 retroareolar position: IDC, high-grade with DCIS high-grade, ER 90%, PR 95%, Ki-67 20%, HER-2 positive ratio 3.4; screening detected left breast distortion LIQ 8 8:00 retroareolar 0.8 cm, axilla negative, T1b N0 stage IA clinical stage Left lumpectomy 09/22/2016: IDC grade 3, 1.3 cm, DCIS high-grade, margins clear, 0/1 lymph node negative, T1 CN 0 stage IA, ER 90%, PR 95%, Ki-67 20%, HER-2 positive ratio 3.4  Treatment summary: 1. Adjuvant chemotherapy and Herceptin with weekly Taxol and Herceptin 12 completed 01/04/2017 followed by Herceptin maintenance for 1 year completed 10/04/2017 3.Adjuvant radiation8/06/2016-03/16/2017 4.Adjuvant antiestrogen therapy with letrozole 2.5 mg daily 5 yearsstarted 05/10/2017 ----------------------------------------------------------------------------- Diabetes:No change in the current plan  Anastrozoletoxicities: Intermittent hot flashes Denies any arthralgias or myalgias  I briefly discussed with her about the role of Neratinib Patient is however not interested in it because of the diarrhea concern.  Breast cancer surveillance: 1.  Mammogram and ultrasound 01/31/2019: Benign, ultrasound was done to evaluate nonfocal bilateral breast pain as well as the left palpable area of concern. 2. breast exam 02/10/2019: Small palpable nodule at 12 o'clock position below the scar for the port on the right side.  We will obtain a mammogram and ultrasound of the right breast.  Return to clinic in 1 year for follow-up.  I sent a prescription refill for anastrozole.

## 2019-02-10 NOTE — Telephone Encounter (Signed)
Patient called to get her appt scheduled per the 8/14 los.  Scheduled appt per 8/14 los and patient is aware of the appt date and time.

## 2019-02-22 ENCOUNTER — Other Ambulatory Visit: Payer: Self-pay | Admitting: Internal Medicine

## 2019-02-22 DIAGNOSIS — E1165 Type 2 diabetes mellitus with hyperglycemia: Secondary | ICD-10-CM

## 2019-02-22 DIAGNOSIS — IMO0002 Reserved for concepts with insufficient information to code with codable children: Secondary | ICD-10-CM

## 2019-02-24 ENCOUNTER — Ambulatory Visit: Payer: No Typology Code available for payment source

## 2019-02-24 ENCOUNTER — Other Ambulatory Visit: Payer: Self-pay

## 2019-02-24 DIAGNOSIS — E538 Deficiency of other specified B group vitamins: Secondary | ICD-10-CM

## 2019-02-24 MED ORDER — CYANOCOBALAMIN 1000 MCG/ML IJ SOLN: 1000.0000 ug | Freq: Once | INTRAMUSCULAR | Status: DC

## 2019-02-24 NOTE — Progress Notes (Addendum)
I have reviewed and agree.

## 2019-03-03 ENCOUNTER — Other Ambulatory Visit: Payer: Self-pay | Admitting: Internal Medicine

## 2019-03-03 DIAGNOSIS — E1165 Type 2 diabetes mellitus with hyperglycemia: Secondary | ICD-10-CM

## 2019-03-03 DIAGNOSIS — IMO0002 Reserved for concepts with insufficient information to code with codable children: Secondary | ICD-10-CM

## 2019-03-03 NOTE — Telephone Encounter (Signed)
Routing to CMA 

## 2019-03-03 NOTE — Telephone Encounter (Signed)
lvm for pt informing rx was sent and to call back if she is having trouble getting rx filled.

## 2019-03-03 NOTE — Telephone Encounter (Signed)
Pt calling to check status. Pt states that this runs out 03/04/19. Please advise.

## 2019-03-15 ENCOUNTER — Other Ambulatory Visit: Payer: Self-pay | Admitting: Internal Medicine

## 2019-03-15 DIAGNOSIS — IMO0002 Reserved for concepts with insufficient information to code with codable children: Secondary | ICD-10-CM

## 2019-03-15 DIAGNOSIS — I10 Essential (primary) hypertension: Secondary | ICD-10-CM

## 2019-03-15 DIAGNOSIS — E785 Hyperlipidemia, unspecified: Secondary | ICD-10-CM

## 2019-03-15 DIAGNOSIS — E1165 Type 2 diabetes mellitus with hyperglycemia: Secondary | ICD-10-CM

## 2019-03-29 ENCOUNTER — Ambulatory Visit: Payer: No Typology Code available for payment source

## 2019-04-01 ENCOUNTER — Other Ambulatory Visit: Payer: Self-pay | Admitting: Internal Medicine

## 2019-04-01 DIAGNOSIS — E1165 Type 2 diabetes mellitus with hyperglycemia: Secondary | ICD-10-CM

## 2019-04-01 DIAGNOSIS — IMO0002 Reserved for concepts with insufficient information to code with codable children: Secondary | ICD-10-CM

## 2019-04-03 ENCOUNTER — Ambulatory Visit (INDEPENDENT_AMBULATORY_CARE_PROVIDER_SITE_OTHER): Payer: No Typology Code available for payment source

## 2019-04-03 ENCOUNTER — Other Ambulatory Visit: Payer: Self-pay

## 2019-04-03 DIAGNOSIS — E538 Deficiency of other specified B group vitamins: Secondary | ICD-10-CM

## 2019-04-03 DIAGNOSIS — Z23 Encounter for immunization: Secondary | ICD-10-CM

## 2019-04-03 MED ORDER — CYANOCOBALAMIN 1000 MCG/ML IJ SOLN
1000.0000 ug | Freq: Once | INTRAMUSCULAR | Status: AC
  Administered 2019-04-03: 17:00:00 1000 ug via INTRAMUSCULAR

## 2019-04-03 NOTE — Progress Notes (Signed)
I have reviewed and agree.

## 2019-04-09 ENCOUNTER — Other Ambulatory Visit: Payer: Self-pay | Admitting: Internal Medicine

## 2019-04-09 DIAGNOSIS — IMO0002 Reserved for concepts with insufficient information to code with codable children: Secondary | ICD-10-CM

## 2019-04-09 DIAGNOSIS — E1165 Type 2 diabetes mellitus with hyperglycemia: Secondary | ICD-10-CM

## 2019-04-11 ENCOUNTER — Ambulatory Visit: Payer: No Typology Code available for payment source | Admitting: Hematology and Oncology

## 2019-04-27 ENCOUNTER — Ambulatory Visit: Payer: No Typology Code available for payment source | Admitting: Internal Medicine

## 2019-05-04 ENCOUNTER — Ambulatory Visit: Payer: No Typology Code available for payment source | Admitting: Internal Medicine

## 2019-05-04 ENCOUNTER — Ambulatory Visit: Payer: No Typology Code available for payment source

## 2019-05-04 ENCOUNTER — Other Ambulatory Visit: Payer: Self-pay

## 2019-05-04 DIAGNOSIS — Z20822 Contact with and (suspected) exposure to covid-19: Secondary | ICD-10-CM

## 2019-05-05 ENCOUNTER — Ambulatory Visit: Payer: No Typology Code available for payment source

## 2019-05-05 LAB — NOVEL CORONAVIRUS, NAA: SARS-CoV-2, NAA: NOT DETECTED

## 2019-05-08 ENCOUNTER — Ambulatory Visit: Payer: No Typology Code available for payment source | Admitting: Internal Medicine

## 2019-05-17 ENCOUNTER — Ambulatory Visit: Payer: No Typology Code available for payment source | Admitting: Internal Medicine

## 2019-05-17 ENCOUNTER — Ambulatory Visit: Payer: No Typology Code available for payment source

## 2019-05-22 ENCOUNTER — Encounter: Payer: Self-pay | Admitting: Internal Medicine

## 2019-05-22 ENCOUNTER — Ambulatory Visit (INDEPENDENT_AMBULATORY_CARE_PROVIDER_SITE_OTHER): Payer: No Typology Code available for payment source | Admitting: Internal Medicine

## 2019-05-22 ENCOUNTER — Ambulatory Visit: Payer: No Typology Code available for payment source

## 2019-05-22 ENCOUNTER — Other Ambulatory Visit (INDEPENDENT_AMBULATORY_CARE_PROVIDER_SITE_OTHER): Payer: No Typology Code available for payment source

## 2019-05-22 ENCOUNTER — Other Ambulatory Visit: Payer: Self-pay

## 2019-05-22 VITALS — BP 126/84 | HR 94 | Temp 99.3°F | Resp 16 | Ht 61.0 in | Wt 289.0 lb

## 2019-05-22 DIAGNOSIS — E118 Type 2 diabetes mellitus with unspecified complications: Secondary | ICD-10-CM

## 2019-05-22 DIAGNOSIS — Z23 Encounter for immunization: Secondary | ICD-10-CM

## 2019-05-22 DIAGNOSIS — E1142 Type 2 diabetes mellitus with diabetic polyneuropathy: Secondary | ICD-10-CM

## 2019-05-22 DIAGNOSIS — G63 Polyneuropathy in diseases classified elsewhere: Secondary | ICD-10-CM | POA: Diagnosis not present

## 2019-05-22 DIAGNOSIS — E538 Deficiency of other specified B group vitamins: Secondary | ICD-10-CM

## 2019-05-22 DIAGNOSIS — I10 Essential (primary) hypertension: Secondary | ICD-10-CM

## 2019-05-22 DIAGNOSIS — E785 Hyperlipidemia, unspecified: Secondary | ICD-10-CM | POA: Diagnosis not present

## 2019-05-22 DIAGNOSIS — IMO0002 Reserved for concepts with insufficient information to code with codable children: Secondary | ICD-10-CM

## 2019-05-22 DIAGNOSIS — Z1211 Encounter for screening for malignant neoplasm of colon: Secondary | ICD-10-CM

## 2019-05-22 DIAGNOSIS — E1165 Type 2 diabetes mellitus with hyperglycemia: Secondary | ICD-10-CM

## 2019-05-22 LAB — LIPID PANEL
Cholesterol: 135 mg/dL (ref 0–200)
HDL: 44.6 mg/dL (ref >39.00)
LDL Cholesterol: 62 mg/dL (ref 0–99)
NonHDL: 90.1
Total CHOL/HDL Ratio: 3
Triglycerides: 143 mg/dL (ref 0.0–149.0)
VLDL: 28.6 mg/dL (ref 0.0–40.0)

## 2019-05-22 LAB — CBC WITH DIFFERENTIAL/PLATELET
Basophils Absolute: 0.1 10*3/uL (ref 0.0–0.1)
Basophils Relative: 0.7 % (ref 0.0–3.0)
Eosinophils Absolute: 0.4 10*3/uL (ref 0.0–0.7)
Eosinophils Relative: 2.9 % (ref 0.0–5.0)
HCT: 43.4 % (ref 36.0–46.0)
Hemoglobin: 14.1 g/dL (ref 12.0–15.0)
Lymphocytes Relative: 22.4 % (ref 12.0–46.0)
Lymphs Abs: 3 10*3/uL (ref 0.7–4.0)
MCHC: 32.5 g/dL (ref 30.0–36.0)
MCV: 80.8 fl (ref 78.0–100.0)
Monocytes Absolute: 0.8 10*3/uL (ref 0.1–1.0)
Monocytes Relative: 6 % (ref 3.0–12.0)
Neutro Abs: 9 10*3/uL — ABNORMAL HIGH (ref 1.4–7.7)
Neutrophils Relative %: 68 % (ref 43.0–77.0)
Platelets: 377 10*3/uL (ref 150.0–400.0)
RBC: 5.37 Mil/uL — ABNORMAL HIGH (ref 3.87–5.11)
RDW: 16 % — ABNORMAL HIGH (ref 11.5–15.5)
WBC: 13.3 10*3/uL — ABNORMAL HIGH (ref 4.0–10.5)

## 2019-05-22 LAB — BASIC METABOLIC PANEL
BUN: 12 mg/dL (ref 6–23)
CO2: 27 mEq/L (ref 19–32)
Calcium: 9.8 mg/dL (ref 8.4–10.5)
Chloride: 102 mEq/L (ref 96–112)
Creatinine, Ser: 0.62 mg/dL (ref 0.40–1.20)
GFR: 96.71 mL/min (ref >60.00)
Glucose, Bld: 136 mg/dL — ABNORMAL HIGH (ref 70–99)
Potassium: 4.5 mEq/L (ref 3.5–5.1)
Sodium: 137 mEq/L (ref 135–145)

## 2019-05-22 LAB — FOLATE: Folate: 14.4 ng/mL (ref >5.9)

## 2019-05-22 LAB — HEMOGLOBIN A1C: Hgb A1c MFr Bld: 7 % — ABNORMAL HIGH (ref 4.6–6.5)

## 2019-05-22 MED ORDER — METFORMIN HCL 500 MG PO TABS: 500.0000 mg | ORAL_TABLET | Freq: Two times a day (BID) | ORAL | 1 refills | Status: DC

## 2019-05-22 MED ORDER — TRESIBA FLEXTOUCH 200 UNIT/ML ~~LOC~~ SOPN: 34.0000 [IU] | PEN_INJECTOR | Freq: Every day | SUBCUTANEOUS | 1 refills | Status: DC

## 2019-05-22 MED ORDER — CYANOCOBALAMIN 1000 MCG/ML IJ SOLN
1000.0000 ug | Freq: Once | INTRAMUSCULAR | Status: DC
  Administered 2019-05-22: 1000 ug via INTRAMUSCULAR

## 2019-05-22 NOTE — Progress Notes (Signed)
Subjective:  Patient ID: Sandra Wood, female    DOB: 1956/06/14  Age: 63 y.o. MRN: [id number]  CC: Hypertension, Hyperlipidemia, and Diabetes   This visit occurred during the SARS-CoV-2 public health emergency.  Safety protocols were in place, including screening questions prior to the visit, additional usage of staff PPE, and extensive cleaning of exam room while observing appropriate contact time as indicated for disinfecting solutions.    HPI Sandra Wood presents for f/up - She complains that the current dosage of Metformin is causing diarrhea.  Her last A1c was down to 6.4%.  She is compliant with the current dosage of Metformin as well as basal insulin and the GLP-1 agonist.  Outpatient Medications Prior to Visit  Medication Sig Dispense Refill   anastrozole (ARIMIDEX) 1 MG tablet Take 1 tablet (1 mg total) by mouth daily. 90 tablet 3   blood glucose meter kit and supplies KIT Dispense based on patient and insurance preference. Use up to four times daily as directed. (FOR ICD-9 250.00, 250.01). Check blood sugar BID. 1 each 0   Continuous Blood Gluc Receiver (FREESTYLE LIBRE 14 DAY READER) DEVI 1 Act by Does not apply route daily. 1 Device 11   Continuous Blood Gluc Sensor (FREESTYLE LIBRE 14 DAY SENSOR) MISC 1 ACT BY DOES NOT APPLY ROUTE DAILY. 1 each 11   cyanocobalamin 100 MCG tablet Take 100 mcg by mouth daily.     Cyanocobalamin 1000 MCG/ML KIT Inject as directed.     glucose blood test strip Use as instructed 100 each 12   Insulin Pen Needle (BD PEN NEEDLE NANO U/F) 32G X 4 MM MISC USE TO INJECT INSULIN DAILY. 100 each 1   losartan (COZAAR) 50 MG tablet TAKE 1 TABLET BY MOUTH EVERY DAY 90 tablet 1   nystatin (MYCOSTATIN/NYSTOP) powder Apply topically 3 (three) times daily. Apply to affected area for up to 7 days 45 g 1   nystatin-triamcinolone (MYCOLOG II) cream Apply 1 application topically 2 (two) times daily. Apply to affected area BID for up to 7 days.  60 g 0   rosuvastatin (CRESTOR) 20 MG tablet TAKE 1 TABLET BY MOUTH EVERY DAY 90 tablet 1   RYBELSUS 7 MG TABS TAKE 1 TABLET BY MOUTH EVERY DAY 90 tablet 1   metFORMIN (GLUCOPHAGE) 1000 MG tablet TAKE 1 TABLET BY MOUTH TWICE A DAY WITH A MEAL 180 tablet 1   TRESIBA FLEXTOUCH 200 UNIT/ML SOPN INJECT 50 UNITS INTO THE SKIN DAILY. 27 pen 1   cyanocobalamin ((VITAMIN B-12)) injection 1,000 mcg      cyanocobalamin ((VITAMIN B-12)) injection 1,000 mcg      No facility-administered medications prior to visit.     ROS Review of Systems  Constitutional: Negative.  Negative for diaphoresis and fatigue.  HENT: Negative.   Eyes: Negative.   Respiratory: Negative for cough, chest tightness, shortness of breath and wheezing.   Gastrointestinal: Positive for diarrhea. Negative for abdominal pain, blood in stool, nausea and vomiting.  Endocrine: Negative.   Genitourinary: Negative for difficulty urinating.  Musculoskeletal: Negative.  Negative for arthralgias and myalgias.  Skin: Negative.  Negative for color change and pallor.  Neurological: Negative.  Negative for dizziness, weakness, light-headedness and headaches.  Hematological: Negative for adenopathy. Does not bruise/bleed easily.  Psychiatric/Behavioral: Negative.     Objective:  BP 126/84 (BP Location: Left Arm, Patient Position: Sitting, Cuff Size: Large)    Pulse 94    Temp 99.3 F (37.4 C) (Oral)  Resp 16    Ht '5\' 1"'$  (1.549 m)    Wt 289 lb (131.1 kg)    LMP 06/30/2003    SpO2 94%    BMI 54.61 kg/m   BP Readings from Last 3 Encounters:  05/22/19 126/84  02/10/19 123/68  02/01/19 (!) 124/58    Wt Readings from Last 3 Encounters:  05/22/19 289 lb (131.1 kg)  02/10/19 287 lb 14.4 oz (130.6 kg)  02/01/19 287 lb (130.2 kg)    Physical Exam Constitutional:      Appearance: She is obese. She is not ill-appearing.  HENT:     Nose: Nose normal.     Mouth/Throat:     Mouth: Mucous membranes are moist.  Eyes:     General:  No scleral icterus.    Conjunctiva/sclera: Conjunctivae normal.  Neck:     Musculoskeletal: Neck supple.  Cardiovascular:     Rate and Rhythm: Normal rate and regular rhythm.     Heart sounds: No murmur.  Pulmonary:     Effort: Pulmonary effort is normal.     Breath sounds: No stridor. No wheezing, rhonchi or rales.  Abdominal:     General: Abdomen is protuberant. Bowel sounds are normal.     Palpations: Abdomen is soft. There is no hepatomegaly.     Tenderness: There is no abdominal tenderness.  Musculoskeletal: Normal range of motion.     Right lower leg: No edema.     Left lower leg: No edema.  Lymphadenopathy:     Cervical: No cervical adenopathy.  Skin:    General: Skin is warm and dry.     Coloration: Skin is not pale.  Neurological:     General: No focal deficit present.     Mental Status: She is alert.  Psychiatric:        Mood and Affect: Mood normal.        Behavior: Behavior normal.     Lab Results  Component Value Date   WBC 13.3 (H) 05/22/2019   HGB 14.1 05/22/2019   HCT 43.4 05/22/2019   PLT 377.0 05/22/2019   GLUCOSE 136 (H) 05/22/2019   CHOL 135 05/22/2019   TRIG 143.0 05/22/2019   HDL 44.60 05/22/2019   LDLDIRECT 177.0 09/13/2018   LDLCALC 62 05/22/2019   ALT 17 09/13/2018   AST 14 09/13/2018   NA 137 05/22/2019   K 4.5 05/22/2019   CL 102 05/22/2019   CREATININE 0.62 05/22/2019   BUN 12 05/22/2019   CO2 27 05/22/2019   TSH 3.15 09/13/2018   INR 1.1 RATIO 07/09/2006   HGBA1C 7.0 (H) 05/22/2019   MICROALBUR 2.1 (H) 09/13/2018    US Breast Ltd Uni Left Inc Axilla  Result Date: 02/01/2019 CLINICAL DATA:  63 year old female with history left breast cancer post lumpectomy 09/22/2016. Patient presents today with pain involving the upper right breast as well as pain and a palpable abnormality in the left breast. EXAM: DIGITAL DIAGNOSTIC BILATERAL MAMMOGRAM WITH CAD AND TOMO BILATERAL BREAST ULTRASOUND COMPARISON:  Previous exam(s). ACR Breast  Density Category c: The breast tissue is heterogeneously dense, which may obscure small masses. FINDINGS: No suspicious masses or calcifications are seen in either breast. Stable lumpectomy changes in the lower central left breast. Spot compression tangential tomograms were performed over the palpable area of concern in the inner left breast with no abnormalities identified. Mammographic images were processed with CAD. Physical examination of the upper right breast in the region of pain does not reveal any palpable  masses. Physical examination of the upper-outer quadrant of the left breast in the region of pain does not reveal any palpable masses. Physical examination in the inner left breast in the region of palpable concern reveals a band like area of thickening however no underlying palpable masses. Targeted ultrasound of the superior right breast was performed. No suspicious masses or abnormality seen, only heterogeneous fibroglandular tissue identified. Targeted ultrasound of upper-outer left breast in the region of pain and in the inner left breast in the region of palpable concern was performed. No suspicious masses or abnormality seen, only heterogeneous fibroglandular tissue identified. IMPRESSION: 1. No mammographic or sonographic abnormalities at the site of pain in the right breast or sites of pain and palpable abnormality in the left breast. 2.  No findings of malignancy in either breast. RECOMMENDATION: 1. Recommend further management of nonfocal bilateral breast pain as well as left breast palpable area of concern be based on clinical assessment. 2. Patient will be due for bilateral diagnostic mammography with magnification view of the left breast lumpectomy site April 2021. I have discussed the findings and recommendations with the patient. Results were also provided in writing at the conclusion of the visit. If applicable, a reminder letter will be sent to the patient regarding the next appointment.  BI-RADS CATEGORY  2: Benign. Electronically Signed   By: Everlean Alstrom M.D.   On: 02/01/2019 10:19   US Breast Ltd Uni Right Inc Axilla  Result Date: 02/01/2019 CLINICAL DATA:  63 year old female with history left breast cancer post lumpectomy 09/22/2016. Patient presents today with pain involving the upper right breast as well as pain and a palpable abnormality in the left breast. EXAM: DIGITAL DIAGNOSTIC BILATERAL MAMMOGRAM WITH CAD AND TOMO BILATERAL BREAST ULTRASOUND COMPARISON:  Previous exam(s). ACR Breast Density Category c: The breast tissue is heterogeneously dense, which may obscure small masses. FINDINGS: No suspicious masses or calcifications are seen in either breast. Stable lumpectomy changes in the lower central left breast. Spot compression tangential tomograms were performed over the palpable area of concern in the inner left breast with no abnormalities identified. Mammographic images were processed with CAD. Physical examination of the upper right breast in the region of pain does not reveal any palpable masses. Physical examination of the upper-outer quadrant of the left breast in the region of pain does not reveal any palpable masses. Physical examination in the inner left breast in the region of palpable concern reveals a band like area of thickening however no underlying palpable masses. Targeted ultrasound of the superior right breast was performed. No suspicious masses or abnormality seen, only heterogeneous fibroglandular tissue identified. Targeted ultrasound of upper-outer left breast in the region of pain and in the inner left breast in the region of palpable concern was performed. No suspicious masses or abnormality seen, only heterogeneous fibroglandular tissue identified. IMPRESSION: 1. No mammographic or sonographic abnormalities at the site of pain in the right breast or sites of pain and palpable abnormality in the left breast. 2.  No findings of malignancy in either breast.  RECOMMENDATION: 1. Recommend further management of nonfocal bilateral breast pain as well as left breast palpable area of concern be based on clinical assessment. 2. Patient will be due for bilateral diagnostic mammography with magnification view of the left breast lumpectomy site April 2021. I have discussed the findings and recommendations with the patient. Results were also provided in writing at the conclusion of the visit. If applicable, a reminder letter will be sent to the  patient regarding the next appointment. BI-RADS CATEGORY  2: Benign. Electronically Signed   By: Everlean Alstrom M.D.   On: 02/01/2019 10:19   Mm Diag Breast Tomo Bilateral  Result Date: 02/01/2019 CLINICAL DATA:  63 year old female with history left breast cancer post lumpectomy 09/22/2016. Patient presents today with pain involving the upper right breast as well as pain and a palpable abnormality in the left breast. EXAM: DIGITAL DIAGNOSTIC BILATERAL MAMMOGRAM WITH CAD AND TOMO BILATERAL BREAST ULTRASOUND COMPARISON:  Previous exam(s). ACR Breast Density Category c: The breast tissue is heterogeneously dense, which may obscure small masses. FINDINGS: No suspicious masses or calcifications are seen in either breast. Stable lumpectomy changes in the lower central left breast. Spot compression tangential tomograms were performed over the palpable area of concern in the inner left breast with no abnormalities identified. Mammographic images were processed with CAD. Physical examination of the upper right breast in the region of pain does not reveal any palpable masses. Physical examination of the upper-outer quadrant of the left breast in the region of pain does not reveal any palpable masses. Physical examination in the inner left breast in the region of palpable concern reveals a band like area of thickening however no underlying palpable masses. Targeted ultrasound of the superior right breast was performed. No suspicious masses or  abnormality seen, only heterogeneous fibroglandular tissue identified. Targeted ultrasound of upper-outer left breast in the region of pain and in the inner left breast in the region of palpable concern was performed. No suspicious masses or abnormality seen, only heterogeneous fibroglandular tissue identified. IMPRESSION: 1. No mammographic or sonographic abnormalities at the site of pain in the right breast or sites of pain and palpable abnormality in the left breast. 2.  No findings of malignancy in either breast. RECOMMENDATION: 1. Recommend further management of nonfocal bilateral breast pain as well as left breast palpable area of concern be based on clinical assessment. 2. Patient will be due for bilateral diagnostic mammography with magnification view of the left breast lumpectomy site April 2021. I have discussed the findings and recommendations with the patient. Results were also provided in writing at the conclusion of the visit. If applicable, a reminder letter will be sent to the patient regarding the next appointment. BI-RADS CATEGORY  2: Benign. Electronically Signed   By: Everlean Alstrom M.D.   On: 02/01/2019 10:19    Assessment & Plan:   Kashawn was seen today for hypertension, hyperlipidemia and diabetes.  Diagnoses and all orders for this visit:  Essential hypertension- Her blood pressure is adequately well controlled. -     CBC with Differential; Future  Type 2 diabetes mellitus with diabetic polyneuropathy, without long-term current use of insulin ([place])- Her A1c is at 7.0%.  Since she is experiencing diarrhea I have recommended that she decrease her Metformin dose by 50%. Will decrease her basal insulin dose to 34 units a day in an effort to avoid hypoglycemia.  She will stay on the current dose of the GLP-1 agonist. -     Basic metabolic panel; Future -     Hemoglobin A1c; Future -     Insulin Degludec (TRESIBA FLEXTOUCH) 200 UNIT/ML SOPN; Inject 34 Units into the skin daily. -      metFORMIN (GLUCOPHAGE) 500 MG tablet; Take 1 tablet (500 mg total) by mouth 2 (two) times daily with a meal.  Vitamin B12 deficiency neuropathy (HCC) -     CBC with Differential; Future -     Folate; Future -  Discontinue: cyanocobalamin ((VITAMIN B-12)) injection 1,000 mcg  Hyperlipidemia LDL goal <100- She has achieved her LDL goal and is doing well on the statin. -     Lipid panel; Future  Need for shingles vaccine -     Varicella-zoster vaccine IM (Shingrix)  Type II diabetes mellitus with manifestations, uncontrolled (HCC) -     Insulin Degludec (TRESIBA FLEXTOUCH) 200 UNIT/ML SOPN; Inject 34 Units into the skin daily. -     metFORMIN (GLUCOPHAGE) 500 MG tablet; Take 1 tablet (500 mg total) by mouth 2 (two) times daily with a meal.   I have discontinued [employer]'s metFORMIN. I have also changed her [employer]. Additionally, I am having her start on metFORMIN. Lastly, I am having her maintain her blood glucose meter kit and supplies, glucose blood, nystatin-triamcinolone, FreeStyle Libre 14 Day Reader, cyanocobalamin, Cyanocobalamin, nystatin, anastrozole, FreeStyle Libre 14 Day Sensor, rosuvastatin, losartan, BD Pen Needle Nano U/F, and Rybelsus. We will stop administering cyanocobalamin and cyanocobalamin. Additionally, we administered cyanocobalamin.  Meds ordered this encounter  Medications   DISCONTD: cyanocobalamin ((VITAMIN B-12)) injection 1,000 mcg   Insulin Degludec (TRESIBA FLEXTOUCH) 200 UNIT/ML SOPN    Sig: Inject 34 Units into the skin daily.    Dispense:  27 pen    Refill:  1   metFORMIN (GLUCOPHAGE) 500 MG tablet    Sig: Take 1 tablet (500 mg total) by mouth 2 (two) times daily with a meal.    Dispense:  180 tablet    Refill:  1     Follow-up: Return in about 6 months (around 11/19/2019).  Scarlette Calico, MD

## 2019-05-22 NOTE — Patient Instructions (Signed)
Type 2 Diabetes Mellitus, Diagnosis, Adult Type 2 diabetes (type 2 diabetes mellitus) is a long-term (chronic) disease. In type 2 diabetes, one or both of these problems may be present:  The pancreas does not make enough of a hormone called insulin.  Cells in the body do not respond properly to insulin that the body makes (insulin resistance). Normally, insulin allows blood sugar (glucose) to enter cells in the body. The cells use glucose for energy. Insulin resistance or lack of insulin causes excess glucose to build up in the blood instead of going into cells. As a result, high blood glucose (hyperglycemia) develops. What increases the risk? The following factors may make you more likely to develop type 2 diabetes:  Having a family member with type 2 diabetes.  Being overweight or obese.  Having an inactive (sedentary) lifestyle.  Having been diagnosed with insulin resistance.  Having a history of prediabetes, gestational diabetes, or polycystic ovary syndrome (PCOS).  Being of American-Indian, African-American, Hispanic/Latino, or Asian/Pacific Islander descent. What are the signs or symptoms? In the early stage of this condition, you may not have symptoms. Symptoms develop slowly and may include:  Increased thirst (polydipsia).  Increased hunger(polyphagia).  Increased urination (polyuria).  Increased urination during the night (nocturia).  Unexplained weight loss.  Frequent infections that keep coming back (recurring).  Fatigue.  Weakness.  Vision changes, such as blurry vision.  Cuts or bruises that are slow to heal.  Tingling or numbness in the hands or feet.  Dark patches on the skin (acanthosis nigricans). How is this diagnosed? This condition is diagnosed based on your symptoms, your medical history, a physical exam, and your blood glucose level. Your blood glucose may be checked with one or more of the following blood tests:  A fasting blood glucose (FBG)  test. You will not be allowed to eat (you will fast) for 8 hours or longer before a blood sample is taken.  A random blood glucose test. This test checks blood glucose at any time of day regardless of when you ate.  An A1c (hemoglobin A1c) blood test. This test provides information about blood glucose control over the previous 2-3 months.  An oral glucose tolerance test (OGTT). This test measures your blood glucose at two times: ? After fasting. This is your baseline blood glucose level. ? Two hours after drinking a beverage that contains glucose. You may be diagnosed with type 2 diabetes if:  Your FBG level is 126 mg/dL (7.0 mmol/L) or higher.  Your random blood glucose level is 200 mg/dL (11.1 mmol/L) or higher.  Your A1c level is 6.5% or higher.  Your OGTT result is higher than 200 mg/dL (11.1 mmol/L). These blood tests may be repeated to confirm your diagnosis. How is this treated? Your treatment may be managed by a specialist called an endocrinologist. Type 2 diabetes may be treated by following instructions from your health care provider about:  Making diet and lifestyle changes. This may include: ? Following an individualized nutrition plan that is developed by a diet and nutrition specialist (registered dietitian). ? Exercising regularly. ? Finding ways to manage stress.  Checking your blood glucose level as often as told.  Taking diabetes medicines or insulin daily. This helps to keep your blood glucose levels in the healthy range. ? If you use insulin, you may need to adjust the dosage depending on how physically active you are and what foods you eat. Your health care provider will tell you how to adjust your dosage.    Taking medicines to help prevent complications from diabetes, such as: ? Aspirin. ? Medicine to lower cholesterol. ? Medicine to control blood pressure. Your health care provider will set individualized treatment goals for you. Your goals will be based on  your age, other medical conditions you have, and how you respond to diabetes treatment. Generally, the goal of treatment is to maintain the following blood glucose levels:  Before meals (preprandial): 80-130 mg/dL (4.4-7.2 mmol/L).  After meals (postprandial): below 180 mg/dL (10 mmol/L).  A1c level: less than 7%. Follow these instructions at home: Questions to ask your health care provider  Consider asking the following questions: ? Do I need to meet with a diabetes educator? ? Where can I find a support group for people with diabetes? ? What equipment will I need to manage my diabetes at home? ? What diabetes medicines do I need, and when should I take them? ? How often do I need to check my blood glucose? ? What number can I call if I have questions? ? When is my next appointment? General instructions  Take over-the-counter and prescription medicines only as told by your health care provider.  Keep all follow-up visits as told by your health care provider. This is important.  For more information about diabetes, visit: ? American Diabetes Association (ADA): www.diabetes.org ? American Association of Diabetes Educators (AADE): www.diabeteseducator.org Contact a health care provider if:  Your blood glucose is at or above 240 mg/dL (13.3 mmol/L) for 2 days in a row.  You have been sick or have had a fever for 2 days or longer, and you are not getting better.  You have any of the following problems for more than 6 hours: ? You cannot eat or drink. ? You have nausea and vomiting. ? You have diarrhea. Get help right away if:  Your blood glucose is lower than 54 mg/dL (3.0 mmol/L).  You become confused or you have trouble thinking clearly.  You have difficulty breathing.  You have moderate or large ketone levels in your urine. Summary  Type 2 diabetes (type 2 diabetes mellitus) is a long-term (chronic) disease. In type 2 diabetes, the pancreas does not make enough of a  hormone called insulin, or cells in the body do not respond properly to insulin that the body makes (insulin resistance).  This condition is treated by making diet and lifestyle changes and taking diabetes medicines or insulin.  Your health care provider will set individualized treatment goals for you. Your goals will be based on your age, other medical conditions you have, and how you respond to diabetes treatment.  Keep all follow-up visits as told by your health care provider. This is important. This information is not intended to replace advice given to you by your health care provider. Make sure you discuss any questions you have with your health care provider. Document Released: 06/15/2005 Document Revised: 08/13/2017 Document Reviewed: 07/19/2015 Elsevier Patient Education  2020 [employer]  

## 2019-05-29 ENCOUNTER — Other Ambulatory Visit: Payer: Self-pay

## 2019-05-30 ENCOUNTER — Encounter: Payer: Self-pay | Admitting: Internal Medicine

## 2019-05-30 ENCOUNTER — Ambulatory Visit: Payer: No Typology Code available for payment source | Admitting: Internal Medicine

## 2019-05-30 VITALS — BP 122/64 | HR 88 | Temp 98.7°F | Ht 61.0 in | Wt 287.4 lb

## 2019-05-30 DIAGNOSIS — E1142 Type 2 diabetes mellitus with diabetic polyneuropathy: Secondary | ICD-10-CM | POA: Diagnosis not present

## 2019-05-30 NOTE — Progress Notes (Signed)
Name: Sandra Wood  Age/ Sex: 63 y.o., female   MRN/ DOB: [id number], 02-Jul-1955     PCP: Janith Lima, MD   Reason for Endocrinology Evaluation: Type 2 Diabetes Mellitus  Initial Endocrine Consultative Visit: 11/07/2018    PATIENT IDENTIFIER: Ms. Sandra Wood is a 63 y.o. female with a past medical history of HTN, T2DM, Asthma, Hx of Breast Ca (2018). The patient has followed with Endocrinology clinic since 11/07/2018 for consultative assistance with management of her diabetes.  DIABETIC HISTORY:  Sandra Wood was diagnosed with T2DM in 2013. Pt was on diet control until 08/2018 when she was started on Metformin, Rybelsus and Insulin. Her hemoglobin A1c has ranged from 6.6% in 2013, peaking at 12.5% in 2020  On her initial visit to our clinic her A1c was 8.8 % . She was on Rybelsus, tresiba and Metformin  SUBJECTIVE:   During the last visit (11/07/2018): Decreased Tresiba to 50 units, continued Metformin and Rybelsus   Today (05/30/2019): Sandra Wood is here for a 3 months follow up on her diabetes management.  She checks her blood sugars multiple  times daily.  The patient has  had hypoglycemic episodes since the last clinic visit, which typically occur multiple times daily . The patient is not symptomatic with these episodes.  Otherwise, the patient has not required any recent emergency interventions for hypoglycemia and has not had recent hospitalizations secondary to hyper or hypoglycemic episodes.   Since her last visit here, she has seen by her PCP and metformin was reduced by 50% due to diarrhea.   ROS: As per HPI and as detailed below: Review of Systems  Constitutional: Negative for chills and fever.  Respiratory: Negative for cough and shortness of breath.   Cardiovascular: Negative for chest pain and leg swelling.  Gastrointestinal: Positive for diarrhea. Negative for nausea.       Diarrhea with dairy   Genitourinary: Negative for frequency.   Endo/Heme/Allergies: Negative for polydipsia.      HOME DIABETES REGIMEN:  Tresiba 34 units daily  Metformin 500 mg BID Rybelsus 7 mg daily     CONTINUOUS GLUCOSE MONITORING RECORD INTERPRETATION    Dates of Recording:  11/16-11/29/20  Sensor description:Freestyle   Results statistics:   CGM use % of time 79  Average and SD 124/20.8  Time in range    97    %  % Time Above 180 3  % Time above 250 0  % Time Below target 0    Glycemic patterns summary: Glucose readings within goal   Hyperglycemic episodes  N/A  Hypoglycemic episodes occurred early morning and sometimes during the day   Overnight periods: no hypoglycemia with stable BG's          HISTORY:  Past Medical History:  Past Medical History:  Diagnosis Date  . Abnormal Pap smear of cervix 2009  . Allergic rhinitis, cause unspecified   . Arthritis   . Arthritis of ankle BILATERAL / HX FX'S  . Asthma, mild persistent   . Breast cancer ([place])   . Dyspnea    with much activity  . Fibroids 2008  . GERD (gastroesophageal reflux disease)    not current  . History of radiation therapy 01/27/17-03/16/17   left breast 1.8 Gy in 28 fractions, left breast boost 2 Gy in 6 fractions  . HSV-2 (herpes simplex virus 2) infection    at age 20  . Malignant neoplasm of lower-inner quadrant of left breast in female, estrogen  receptor positive ([place]) 08/20/2016  . Obesity, morbid ([place])   . Personal history of chemotherapy   . Personal history of radiation therapy   . Pneumonia    2013, 2017  . Tear of medial meniscus of knee LEFT  . Type II or unspecified type diabetes mellitus without mention of complication, uncontrolled    Type II- has never been on medication   Past Surgical History:  Past Surgical History:  Procedure Laterality Date  . BREAST BIOPSY    . BREAST LUMPECTOMY Left 09/22/2016  . BREAST LUMPECTOMY WITH RADIOACTIVE SEED AND SENTINEL LYMPH NODE BIOPSY Left 09/22/2016   Procedure: BREAST LUMPECTOMY  WITH RADIOACTIVE SEED AND LEFT AXILLARY SENTINEL LYMPH NODE BIOPSY;  Surgeon: Alphonsa Overall, MD;  Location: [facility];  Service: General;  Laterality: Left;  . CHONDROPLASTY  08/19/2011   Procedure: CHONDROPLASTY;  Surgeon: Magnus Sinning, MD;  Location: [facility];  Service: Orthopedics;;  shaving of medial femeral chondral  . DILATION AND CURETTAGE OF UTERUS    . HYSTEROSCOPY W/D&C  2008   benign endometrial polyp - Dr. Maryelizabeth Rowan  . KNEE ARTHROSCOPY W/ MENISCECTOMY Bilateral 07/07/2010   1 year apart  . MENISECTOMY  08/19/2011   Procedure: MENISECTOMY;  Surgeon: Magnus Sinning, MD;  Location: [facility];  Service: Orthopedics;;  partial lateral menisectomy  . PORT-A-CATH REMOVAL N/A 12/21/2017   Procedure: REMOVAL PORT-A-CATH;  Surgeon: Alphonsa Overall, MD;  Location: [facility];  Service: General;  Laterality: N/A;  . PORTACATH PLACEMENT N/A 09/22/2016   Procedure: INSERTION PORT-A-CATH;  Surgeon: Alphonsa Overall, MD;  Location: [facility];  Service: General;  Laterality: N/A;  . TONSILLECTOMY    . TUBAL LIGATION  AGE 75    Social History:  reports that she quit smoking about 32 years ago. Her smoking use included cigarettes. She quit after 5.00 years of use. She has never used smokeless tobacco. She reports previous alcohol use. She reports that she does not use drugs. Family History:  Family History  Problem Relation Age of Onset  . Diabetes Mother   . Heart disease Father   . Cancer Brother 76       prostate  . Breast cancer Maternal Grandmother 88     HOME MEDICATIONS: Allergies as of 05/30/2019      Reactions   No Known Allergies       Medication List       Accurate as of May 30, 2019  1:23 PM. If you have any questions, ask your nurse or doctor.        anastrozole 1 MG tablet Commonly known as: ARIMIDEX Take 1 tablet (1 mg total) by mouth daily.   BD Pen Needle Nano U/F 32G X 4 MM Misc Generic drug: Insulin Pen Needle USE TO INJECT INSULIN  DAILY.   blood glucose meter kit and supplies Kit Dispense based on patient and insurance preference. Use up to four times daily as directed. (FOR ICD-9 250.00, 250.01). Check blood sugar BID.   cyanocobalamin 100 MCG tablet Take 100 mcg by mouth daily.   Cyanocobalamin 1000 MCG/ML Kit Inject as directed.   FreeStyle Libre 14 Day Reader Kerrin Mo 1 Act by Does not apply route daily.   FreeStyle Libre 14 Day Sensor Misc 1 ACT BY DOES NOT APPLY ROUTE DAILY.   glucose blood test strip Use as instructed   losartan 50 MG tablet Commonly known as: COZAAR TAKE 1 TABLET BY MOUTH EVERY DAY   metFORMIN 500 MG tablet  Commonly known as: GLUCOPHAGE Take 1 tablet (500 mg total) by mouth 2 (two) times daily with a meal.   nystatin powder Commonly known as: MYCOSTATIN/NYSTOP Apply topically 3 (three) times daily. Apply to affected area for up to 7 days   nystatin-triamcinolone cream Commonly known as: MYCOLOG II Apply 1 application topically 2 (two) times daily. Apply to affected area BID for up to 7 days.   rosuvastatin 20 MG tablet Commonly known as: CRESTOR TAKE 1 TABLET BY MOUTH EVERY DAY   Rybelsus 7 MG Tabs Generic drug: Semaglutide TAKE 1 TABLET BY MOUTH EVERY DAY   Tresiba FlexTouch 200 UNIT/ML Sopn Generic drug: Insulin Degludec Inject 34 Units into the skin daily.        OBJECTIVE:   Vital Signs: BP 122/64 (BP Location: Right Arm, Patient Position: Sitting, Cuff Size: Large)   Pulse 88   Temp 98.7 F (37.1 C)   Ht '5\' 1"'$  (1.549 m)   Wt 287 lb 6.4 oz (130.4 kg)   LMP 06/30/2003   SpO2 96%   BMI 54.30 kg/m   Wt Readings from Last 3 Encounters:  05/30/19 287 lb 6.4 oz (130.4 kg)  05/22/19 289 lb (131.1 kg)  02/10/19 287 lb 14.4 oz (130.6 kg)     Exam: General: Pt appears well and is in NAD  Lungs: Clear with good BS bilat with no rales, rhonchi, or wheezes  Heart: RRR with normal S1 and S2 and no gallops; no murmurs; no rub  Abdomen: Normoactive bowel  sounds, soft, nontender, without masses or organomegaly palpable  Extremities: No pretibial edema. No tremor.  Skin: Normal texture and temperature to palpation. No rash noted.   Neuro: MS is good with appropriate affect, pt is alert and Ox3       DM foot exam: 11/04/2018 The skin of the feet is intact without sores or ulcerations.Plantar callous formation noted bilaterally The pedal pulses are 2+ on right and 2+ on left. The sensation is intact to a screening 5.07, 10 gram monofilament bilaterally       DATA REVIEWED:  Lab Results  Component Value Date   HGBA1C 7.0 (H) 05/22/2019   HGBA1C 6.4 (A) 02/01/2019   HGBA1C 8.8 (A) 11/04/2018   Lab Results  Component Value Date   MICROALBUR 2.1 (H) 09/13/2018   LDLCALC 62 05/22/2019   CREATININE 0.62 05/22/2019   Lab Results  Component Value Date   MICRALBCREAT 2.3 09/13/2018     Lab Results  Component Value Date   CHOL 135 05/22/2019   HDL 44.60 05/22/2019   LDLCALC 62 05/22/2019   LDLDIRECT 177.0 09/13/2018   TRIG 143.0 05/22/2019   CHOLHDL 3 05/22/2019         ASSESSMENT / PLAN / RECOMMENDATIONS:   1) Type 2 Diabetes Mellitus, Optimally controlled, With Neuropathic complications - Most recent A1c of 7.0 %. Goal A1c < 7.0 %.    - Praised the patient on continued improvement in glucose control  - Diarrhea has improved with reduction in metformin dose. Will reduce this further down as below - We discussed that Rybelsus could be a contributary factor in her diarrhea as well, at this time will continue on current dose    MEDICATIONS: - Tresiba  34 Units daily  - Decrease  Metformin 500 mg with Supper  - Continue Rybelsus 7 mg daily    EDUCATION / INSTRUCTIONS:  BG monitoring instructions: Patient is instructed to check her blood sugars 2 times a day, fasting and bedtime.  Call [facility] if: BG persistently < 70 or > 300. . I reviewed the Rule of 15 for the treatment of hypoglycemia in detail  with the patient. Literature supplied.      F/U in 4 months    Signed electronically by: Mack Guise, MD  [facility]  [facility] [address] [place], [place] [address] Phone: [phone] FAX: [phone]   CC: Janith Lima, MD [address] [place] [address] Phone: [phone]  Fax: [phone]  Return to Endocrinology clinic as below: Future Appointments  Date Time Provider [place]  01/25/2020  3:00 PM Nunzio Cobbs, MD [facility] None  02/09/2020 10:30 AM Nicholas Lose, MD [facility] None

## 2019-05-30 NOTE — Patient Instructions (Addendum)
-   Continue  tresiba  34 Units daily  - Take Metformin  500 mg with Supper  - Continue Rybelsus 7 mg daily      HOW TO TREAT LOW BLOOD SUGARS (Blood sugar LESS THAN 70 MG/DL)  Please follow the RULE OF 15 for the treatment of hypoglycemia treatment (when your (blood sugars are less than 70 mg/dL)    STEP 1: Take 15 grams of carbohydrates when your blood sugar is low, which includes:   3-4 GLUCOSE TABS  OR  3-4 OZ OF JUICE OR REGULAR SODA OR  ONE TUBE OF GLUCOSE GEL     STEP 2: RECHECK blood sugar in 15 MINUTES STEP 3: If your blood sugar is still low at the 15 minute recheck --> then, go back to STEP 1 and treat AGAIN with another 15 grams of carbohydrates.

## 2019-06-01 ENCOUNTER — Telehealth: Payer: Self-pay | Admitting: *Deleted

## 2019-06-01 NOTE — Addendum Note (Signed)
Addended by: Aviva Signs M on: 06/01/2019 11:57 AM   Modules accepted: Orders

## 2019-06-01 NOTE — Telephone Encounter (Signed)
Dr. Quincy Simmonds -Patient is in [facility] hold for f/u of left breast mass and right breast pain. Patient did see Dr. Lindi Adie on 02/10/19, ok to remove from [facility] hold?

## 2019-06-05 NOTE — Telephone Encounter (Signed)
Ok to remove from mammogram hold.  

## 2019-06-05 NOTE — Telephone Encounter (Signed)
Patient removed from [facility] hold.   Encounter closed.  

## 2019-06-28 ENCOUNTER — Other Ambulatory Visit: Payer: Self-pay

## 2019-06-28 ENCOUNTER — Ambulatory Visit (INDEPENDENT_AMBULATORY_CARE_PROVIDER_SITE_OTHER): Payer: No Typology Code available for payment source

## 2019-06-28 DIAGNOSIS — E538 Deficiency of other specified B group vitamins: Secondary | ICD-10-CM | POA: Diagnosis not present

## 2019-06-28 MED ORDER — CYANOCOBALAMIN 1000 MCG/ML IJ SOLN
1000.0000 ug | Freq: Once | INTRAMUSCULAR | Status: AC
  Administered 2019-06-28: 1000 ug via INTRAMUSCULAR

## 2019-06-28 NOTE — Progress Notes (Signed)
I have reviewed and agree.

## 2019-07-24 ENCOUNTER — Ambulatory Visit (INDEPENDENT_AMBULATORY_CARE_PROVIDER_SITE_OTHER): Payer: No Typology Code available for payment source | Admitting: *Deleted

## 2019-07-24 ENCOUNTER — Other Ambulatory Visit: Payer: Self-pay

## 2019-07-24 DIAGNOSIS — E538 Deficiency of other specified B group vitamins: Secondary | ICD-10-CM | POA: Diagnosis not present

## 2019-07-24 MED ORDER — CYANOCOBALAMIN 1000 MCG/ML IJ SOLN
1000.0000 ug | Freq: Once | INTRAMUSCULAR | Status: AC
  Administered 2019-07-24: 1000 ug via INTRAMUSCULAR

## 2019-07-24 NOTE — Progress Notes (Signed)
I have reviewed and agree.

## 2019-08-24 ENCOUNTER — Other Ambulatory Visit: Payer: Self-pay

## 2019-08-24 ENCOUNTER — Ambulatory Visit (INDEPENDENT_AMBULATORY_CARE_PROVIDER_SITE_OTHER): Payer: No Typology Code available for payment source | Admitting: *Deleted

## 2019-08-24 DIAGNOSIS — E538 Deficiency of other specified B group vitamins: Secondary | ICD-10-CM

## 2019-08-24 MED ORDER — CYANOCOBALAMIN 1000 MCG/ML IJ SOLN
1000.0000 ug | Freq: Once | INTRAMUSCULAR | Status: AC
  Administered 2019-08-24: 10:00:00 1000 ug via INTRAMUSCULAR

## 2019-08-24 NOTE — Progress Notes (Addendum)
I have reviewed and agree b12 Injection given.

## 2019-09-06 ENCOUNTER — Other Ambulatory Visit: Payer: Self-pay

## 2019-09-06 DIAGNOSIS — E785 Hyperlipidemia, unspecified: Secondary | ICD-10-CM

## 2019-09-06 DIAGNOSIS — E1165 Type 2 diabetes mellitus with hyperglycemia: Secondary | ICD-10-CM

## 2019-09-06 DIAGNOSIS — IMO0002 Reserved for concepts with insufficient information to code with codable children: Secondary | ICD-10-CM

## 2019-09-06 DIAGNOSIS — I10 Essential (primary) hypertension: Secondary | ICD-10-CM

## 2019-09-06 MED ORDER — LOSARTAN POTASSIUM 50 MG PO TABS: 50.0000 mg | ORAL_TABLET | Freq: Every day | ORAL | 0 refills | Status: DC

## 2019-09-06 MED ORDER — ROSUVASTATIN CALCIUM 20 MG PO TABS: 20.0000 mg | ORAL_TABLET | Freq: Every day | ORAL | 0 refills | Status: DC

## 2019-09-09 ENCOUNTER — Other Ambulatory Visit: Payer: Self-pay | Admitting: Internal Medicine

## 2019-09-09 DIAGNOSIS — E1165 Type 2 diabetes mellitus with hyperglycemia: Secondary | ICD-10-CM

## 2019-09-09 DIAGNOSIS — IMO0002 Reserved for concepts with insufficient information to code with codable children: Secondary | ICD-10-CM

## 2019-09-21 ENCOUNTER — Other Ambulatory Visit: Payer: Self-pay

## 2019-09-21 ENCOUNTER — Ambulatory Visit (INDEPENDENT_AMBULATORY_CARE_PROVIDER_SITE_OTHER): Payer: No Typology Code available for payment source

## 2019-09-21 DIAGNOSIS — E538 Deficiency of other specified B group vitamins: Secondary | ICD-10-CM | POA: Diagnosis not present

## 2019-09-21 DIAGNOSIS — G63 Polyneuropathy in diseases classified elsewhere: Secondary | ICD-10-CM | POA: Diagnosis not present

## 2019-09-21 MED ORDER — CYANOCOBALAMIN 1000 MCG/ML IJ SOLN
1000.0000 ug | Freq: Once | INTRAMUSCULAR | Status: AC
  Administered 2019-09-21: 17:00:00 1000 ug via INTRAMUSCULAR

## 2019-09-21 NOTE — Progress Notes (Signed)
I have reviewed and agree.

## 2019-09-21 NOTE — Progress Notes (Deleted)
Subjective:  Patient ID: Sandra Wood, female    DOB: Jun 21, 1956  Age: 64 y.o. MRN: [id number]  CC: No chief complaint on file.   HPI ADANNA ZUCKERMAN presents for ***  Outpatient Medications Prior to Visit  Medication Sig Dispense Refill  . anastrozole (ARIMIDEX) 1 MG tablet Take 1 tablet (1 mg total) by mouth daily. 90 tablet 3  . blood glucose meter kit and supplies KIT Dispense based on patient and insurance preference. Use up to four times daily as directed. (FOR ICD-9 250.00, 250.01). Check blood sugar BID. 1 each 0  . Continuous Blood Gluc Receiver (FREESTYLE LIBRE 14 DAY READER) DEVI 1 Act by Does not apply route daily. 1 Device 11  . Continuous Blood Gluc Sensor (FREESTYLE LIBRE 14 DAY SENSOR) MISC USE 1 SENSOR EVERY 14 DAYS 2 each 11  . cyanocobalamin 100 MCG tablet Take 100 mcg by mouth daily.    . Cyanocobalamin 1000 MCG/ML KIT Inject as directed.    Marland Kitchen glucose blood test strip Use as instructed 100 each 12  . Insulin Degludec (TRESIBA FLEXTOUCH) 200 UNIT/ML SOPN Inject 34 Units into the skin daily. 27 pen 1  . Insulin Pen Needle (BD PEN NEEDLE NANO U/F) 32G X 4 MM MISC USE TO INJECT INSULIN DAILY. 100 each 1  . losartan (COZAAR) 50 MG tablet Take 1 tablet (50 mg total) by mouth daily. 30 tablet 0  . metFORMIN (GLUCOPHAGE) 500 MG tablet Take 1 tablet (500 mg total) by mouth 2 (two) times daily with a meal. 180 tablet 1  . nystatin (MYCOSTATIN/NYSTOP) powder Apply topically 3 (three) times daily. Apply to affected area for up to 7 days 45 g 1  . nystatin-triamcinolone (MYCOLOG II) cream Apply 1 application topically 2 (two) times daily. Apply to affected area BID for up to 7 days. 60 g 0  . rosuvastatin (CRESTOR) 20 MG tablet Take 1 tablet (20 mg total) by mouth daily. 30 tablet 0  . RYBELSUS 7 MG TABS TAKE 1 TABLET BY MOUTH EVERY DAY 90 tablet 1   No facility-administered medications prior to visit.    ROS Review of Systems  Objective:  LMP 06/30/2003   BP  Readings from Last 3 Encounters:  05/30/19 122/64  05/22/19 126/84  02/10/19 123/68    Wt Readings from Last 3 Encounters:  05/30/19 287 lb 6.4 oz (130.4 kg)  05/22/19 289 lb (131.1 kg)  02/10/19 287 lb 14.4 oz (130.6 kg)    Physical Exam  Lab Results  Component Value Date   WBC 13.3 (H) 05/22/2019   HGB 14.1 05/22/2019   HCT 43.4 05/22/2019   PLT 377.0 05/22/2019   GLUCOSE 136 (H) 05/22/2019   CHOL 135 05/22/2019   TRIG 143.0 05/22/2019   HDL 44.60 05/22/2019   LDLDIRECT 177.0 09/13/2018   LDLCALC 62 05/22/2019   ALT 17 09/13/2018   AST 14 09/13/2018   NA 137 05/22/2019   K 4.5 05/22/2019   CL 102 05/22/2019   CREATININE 0.62 05/22/2019   BUN 12 05/22/2019   CO2 27 05/22/2019   TSH 3.15 09/13/2018   INR 1.1 RATIO 07/09/2006   HGBA1C 7.0 (H) 05/22/2019   MICROALBUR 2.1 (H) 09/13/2018    US BREAST LTD UNI LEFT INC AXILLA  Result Date: 02/01/2019 CLINICAL DATA:  64 year old female with history left breast cancer post lumpectomy 09/22/2016. Patient presents today with pain involving the upper right breast as well as pain and a palpable abnormality in the left breast. EXAM: DIGITAL  DIAGNOSTIC BILATERAL MAMMOGRAM WITH CAD AND TOMO BILATERAL BREAST ULTRASOUND COMPARISON:  Previous exam(s). ACR Breast Density Category c: The breast tissue is heterogeneously dense, which may obscure small masses. FINDINGS: No suspicious masses or calcifications are seen in either breast. Stable lumpectomy changes in the lower central left breast. Spot compression tangential tomograms were performed over the palpable area of concern in the inner left breast with no abnormalities identified. Mammographic images were processed with CAD. Physical examination of the upper right breast in the region of pain does not reveal any palpable masses. Physical examination of the upper-outer quadrant of the left breast in the region of pain does not reveal any palpable masses. Physical examination in the inner left  breast in the region of palpable concern reveals a band like area of thickening however no underlying palpable masses. Targeted ultrasound of the superior right breast was performed. No suspicious masses or abnormality seen, only heterogeneous fibroglandular tissue identified. Targeted ultrasound of upper-outer left breast in the region of pain and in the inner left breast in the region of palpable concern was performed. No suspicious masses or abnormality seen, only heterogeneous fibroglandular tissue identified. IMPRESSION: 1. No mammographic or sonographic abnormalities at the site of pain in the right breast or sites of pain and palpable abnormality in the left breast. 2.  No findings of malignancy in either breast. RECOMMENDATION: 1. Recommend further management of nonfocal bilateral breast pain as well as left breast palpable area of concern be based on clinical assessment. 2. Patient will be due for bilateral diagnostic mammography with magnification view of the left breast lumpectomy site April 2021. I have discussed the findings and recommendations with the patient. Results were also provided in writing at the conclusion of the visit. If applicable, a reminder letter will be sent to the patient regarding the next appointment. BI-RADS CATEGORY  2: Benign. Electronically Signed   By: Everlean Alstrom M.D.   On: 02/01/2019 10:19   US BREAST LTD UNI RIGHT INC AXILLA  Result Date: 02/01/2019 CLINICAL DATA:  64 year old female with history left breast cancer post lumpectomy 09/22/2016. Patient presents today with pain involving the upper right breast as well as pain and a palpable abnormality in the left breast. EXAM: DIGITAL DIAGNOSTIC BILATERAL MAMMOGRAM WITH CAD AND TOMO BILATERAL BREAST ULTRASOUND COMPARISON:  Previous exam(s). ACR Breast Density Category c: The breast tissue is heterogeneously dense, which may obscure small masses. FINDINGS: No suspicious masses or calcifications are seen in either  breast. Stable lumpectomy changes in the lower central left breast. Spot compression tangential tomograms were performed over the palpable area of concern in the inner left breast with no abnormalities identified. Mammographic images were processed with CAD. Physical examination of the upper right breast in the region of pain does not reveal any palpable masses. Physical examination of the upper-outer quadrant of the left breast in the region of pain does not reveal any palpable masses. Physical examination in the inner left breast in the region of palpable concern reveals a band like area of thickening however no underlying palpable masses. Targeted ultrasound of the superior right breast was performed. No suspicious masses or abnormality seen, only heterogeneous fibroglandular tissue identified. Targeted ultrasound of upper-outer left breast in the region of pain and in the inner left breast in the region of palpable concern was performed. No suspicious masses or abnormality seen, only heterogeneous fibroglandular tissue identified. IMPRESSION: 1. No mammographic or sonographic abnormalities at the site of pain in the right breast or sites  of pain and palpable abnormality in the left breast. 2.  No findings of malignancy in either breast. RECOMMENDATION: 1. Recommend further management of nonfocal bilateral breast pain as well as left breast palpable area of concern be based on clinical assessment. 2. Patient will be due for bilateral diagnostic mammography with magnification view of the left breast lumpectomy site April 2021. I have discussed the findings and recommendations with the patient. Results were also provided in writing at the conclusion of the visit. If applicable, a reminder letter will be sent to the patient regarding the next appointment. BI-RADS CATEGORY  2: Benign. Electronically Signed   By: Everlean Alstrom M.D.   On: 02/01/2019 10:19   MM DIAG BREAST TOMO BILATERAL  Result Date: 02/01/2019  CLINICAL DATA:  64 year old female with history left breast cancer post lumpectomy 09/22/2016. Patient presents today with pain involving the upper right breast as well as pain and a palpable abnormality in the left breast. EXAM: DIGITAL DIAGNOSTIC BILATERAL MAMMOGRAM WITH CAD AND TOMO BILATERAL BREAST ULTRASOUND COMPARISON:  Previous exam(s). ACR Breast Density Category c: The breast tissue is heterogeneously dense, which may obscure small masses. FINDINGS: No suspicious masses or calcifications are seen in either breast. Stable lumpectomy changes in the lower central left breast. Spot compression tangential tomograms were performed over the palpable area of concern in the inner left breast with no abnormalities identified. Mammographic images were processed with CAD. Physical examination of the upper right breast in the region of pain does not reveal any palpable masses. Physical examination of the upper-outer quadrant of the left breast in the region of pain does not reveal any palpable masses. Physical examination in the inner left breast in the region of palpable concern reveals a band like area of thickening however no underlying palpable masses. Targeted ultrasound of the superior right breast was performed. No suspicious masses or abnormality seen, only heterogeneous fibroglandular tissue identified. Targeted ultrasound of upper-outer left breast in the region of pain and in the inner left breast in the region of palpable concern was performed. No suspicious masses or abnormality seen, only heterogeneous fibroglandular tissue identified. IMPRESSION: 1. No mammographic or sonographic abnormalities at the site of pain in the right breast or sites of pain and palpable abnormality in the left breast. 2.  No findings of malignancy in either breast. RECOMMENDATION: 1. Recommend further management of nonfocal bilateral breast pain as well as left breast palpable area of concern be based on clinical assessment. 2.  Patient will be due for bilateral diagnostic mammography with magnification view of the left breast lumpectomy site April 2021. I have discussed the findings and recommendations with the patient. Results were also provided in writing at the conclusion of the visit. If applicable, a reminder letter will be sent to the patient regarding the next appointment. BI-RADS CATEGORY  2: Benign. Electronically Signed   By: Everlean Alstrom M.D.   On: 02/01/2019 10:19    Assessment & Plan:   Diagnoses and all orders for this visit:  Essential hypertension -     Basic metabolic panel; Future -     Urinalysis, Routine w reflex microscopic; Future  Type 2 diabetes mellitus with diabetic polyneuropathy, without long-term current use of insulin (HCC) -     Basic metabolic panel; Future -     Hemoglobin A1c; Future -     Urinalysis, Routine w reflex microscopic; Future -     Microalbumin / creatinine urine ratio; Future  Vitamin B12 deficiency neuropathy ([place]) -  Folate; Future -     CBC with Differential/Platelet; Future   I am having Muriah L. Eskridge maintain her blood glucose meter kit and supplies, glucose blood, nystatin-triamcinolone, FreeStyle Libre 14 Day Reader, cyanocobalamin, Cyanocobalamin, nystatin, anastrozole, BD Pen Needle Nano U/F, Rybelsus, Tresiba FlexTouch, metFORMIN, rosuvastatin, losartan, and FreeStyle Libre 14 Day Sensor.  No orders of the defined types were placed in this encounter.    Follow-up: No follow-ups on file.  Scarlette Calico, MD

## 2019-10-03 ENCOUNTER — Other Ambulatory Visit: Payer: Self-pay | Admitting: Internal Medicine

## 2019-10-03 DIAGNOSIS — I10 Essential (primary) hypertension: Secondary | ICD-10-CM

## 2019-10-03 DIAGNOSIS — E1165 Type 2 diabetes mellitus with hyperglycemia: Secondary | ICD-10-CM

## 2019-10-03 DIAGNOSIS — IMO0002 Reserved for concepts with insufficient information to code with codable children: Secondary | ICD-10-CM

## 2019-10-03 DIAGNOSIS — E785 Hyperlipidemia, unspecified: Secondary | ICD-10-CM

## 2019-10-05 ENCOUNTER — Ambulatory Visit: Payer: No Typology Code available for payment source | Admitting: Internal Medicine

## 2019-10-05 ENCOUNTER — Other Ambulatory Visit: Payer: Self-pay | Admitting: Internal Medicine

## 2019-10-05 ENCOUNTER — Other Ambulatory Visit: Payer: Self-pay

## 2019-10-05 DIAGNOSIS — E1165 Type 2 diabetes mellitus with hyperglycemia: Secondary | ICD-10-CM

## 2019-10-05 DIAGNOSIS — IMO0002 Reserved for concepts with insufficient information to code with codable children: Secondary | ICD-10-CM

## 2019-10-05 MED ORDER — BD PEN NEEDLE NANO U/F 32G X 4 MM MISC: 1 refills | Status: DC

## 2019-10-09 ENCOUNTER — Ambulatory Visit: Payer: Medicare Other | Admitting: Internal Medicine

## 2019-10-09 ENCOUNTER — Encounter: Payer: Self-pay | Admitting: Internal Medicine

## 2019-10-09 ENCOUNTER — Other Ambulatory Visit: Payer: Self-pay

## 2019-10-09 VITALS — BP 118/72 | HR 85 | Temp 98.1°F | Ht 61.0 in | Wt 295.2 lb

## 2019-10-09 DIAGNOSIS — IMO0002 Reserved for concepts with insufficient information to code with codable children: Secondary | ICD-10-CM

## 2019-10-09 DIAGNOSIS — E118 Type 2 diabetes mellitus with unspecified complications: Secondary | ICD-10-CM | POA: Diagnosis not present

## 2019-10-09 DIAGNOSIS — E1165 Type 2 diabetes mellitus with hyperglycemia: Secondary | ICD-10-CM | POA: Diagnosis not present

## 2019-10-09 DIAGNOSIS — E1142 Type 2 diabetes mellitus with diabetic polyneuropathy: Secondary | ICD-10-CM | POA: Diagnosis not present

## 2019-10-09 LAB — POCT GLYCOSYLATED HEMOGLOBIN (HGB A1C): Hemoglobin A1C: 7.2 % — AB (ref 4.0–5.6)

## 2019-10-09 MED ORDER — RYBELSUS 7 MG PO TABS: 1.0000 | ORAL_TABLET | Freq: Every day | ORAL | 1 refills | Status: DC

## 2019-10-09 MED ORDER — METFORMIN HCL 500 MG PO TABS: 500.0000 mg | ORAL_TABLET | Freq: Every day | ORAL | 3 refills | Status: DC

## 2019-10-09 MED ORDER — TRESIBA FLEXTOUCH 200 UNIT/ML ~~LOC~~ SOPN: 34.0000 [IU] | PEN_INJECTOR | Freq: Every day | SUBCUTANEOUS | 11 refills | Status: DC

## 2019-10-09 NOTE — Progress Notes (Signed)
Name: Sandra Wood  Age/ Sex: 64 y.o., female   MRN/ DOB: [id number], [birth year]     PCP: Janith Lima, MD   Reason for Endocrinology Evaluation: Type 2 Diabetes Mellitus  Initial Endocrine Consultative Visit: 11/07/2018    PATIENT IDENTIFIER: Sandra Wood is a 64 y.o. female with a past medical history of HTN, T2DM, Asthma, Hx of Breast Ca (2018). The patient has followed with Endocrinology clinic since 11/07/2018 for consultative assistance with management of her diabetes.  DIABETIC HISTORY:  Sandra Wood was diagnosed with T2DM in 2013. Pt was on diet control until 08/2018 when she was started on Metformin, Rybelsus and Insulin. Her hemoglobin A1c has ranged from 6.6% in 2013, peaking at 12.5% in 2020  On her initial visit to our clinic her A1c was 8.8 % . She was on Rybelsus, tresiba and Metformin  SUBJECTIVE:   During the last visit (05/30/2019): Decreased  Metformin , continued and Rybelsus   Today (10/09/2019): Sandra Wood is here for a 3 months follow up on her diabetes management.  She checks her blood sugars multiple  times daily through the CGM.  The patient has  had hypoglycemic episodes since the last clinic visit, which typically occur multiple times daily . The patient is not symptomatic with these episodes.  Otherwise, the patient has not required any recent emergency interventions for hypoglycemia and has not had recent hospitalizations secondary to hyper or hypoglycemic episodes.    ROS: As per HPI and as detailed below: Review of Systems  Gastrointestinal: Negative for diarrhea and nausea.  Genitourinary: Negative for frequency.  Endo/Heme/Allergies: Negative for polydipsia.      HOME DIABETES REGIMEN:  Tresiba 34 units daily  Metformin 500 mg daily  Rybelsus 7 mg daily    CONTINUOUS GLUCOSE MONITORING RECORD INTERPRETATION    Dates of Recording:  3/30-4/05/2020  Sensor description:Freestyle   Results statistics:   CGM use % of time  51  Average and SD 109/29.6  Time in range    88  %  % Time Above 180 1  % Time above 250 0  % Time Below target 6    Glycemic patterns summary: Glucose readings within goal   Hyperglycemic episodes  N/A  Hypoglycemic episodes occurred early morning and sometimes during the day   Overnight periods: sometimes stable and at times low.      HISTORY:  Past Medical History:  Past Medical History:  Diagnosis Date  . Abnormal Pap smear of cervix 2009  . Allergic rhinitis, cause unspecified   . Arthritis   . Arthritis of ankle BILATERAL / HX FX'S  . Asthma, mild persistent   . Breast cancer ([place])   . Dyspnea    with much activity  . Fibroids 2008  . GERD (gastroesophageal reflux disease)    not current  . History of radiation therapy 01/27/17-03/16/17   left breast 1.8 Gy in 28 fractions, left breast boost 2 Gy in 6 fractions  . HSV-2 (herpes simplex virus 2) infection    at age 52  . Malignant neoplasm of lower-inner quadrant of left breast in female, estrogen receptor positive ([place]) 08/20/2016  . Obesity, morbid ([place])   . Personal history of chemotherapy   . Personal history of radiation therapy   . Pneumonia    2013, 2017  . Tear of medial meniscus of knee LEFT  . Type II or unspecified type diabetes mellitus without mention of complication, uncontrolled    Type II- has  never been on medication   Past Surgical History:  Past Surgical History:  Procedure Laterality Date  . BREAST BIOPSY    . BREAST LUMPECTOMY Left 09/22/2016  . BREAST LUMPECTOMY WITH RADIOACTIVE SEED AND SENTINEL LYMPH NODE BIOPSY Left 09/22/2016   Procedure: BREAST LUMPECTOMY WITH RADIOACTIVE SEED AND LEFT AXILLARY SENTINEL LYMPH NODE BIOPSY;  Surgeon: Alphonsa Overall, MD;  Location: [facility];  Service: General;  Laterality: Left;  . CHONDROPLASTY  08/19/2011   Procedure: CHONDROPLASTY;  Surgeon: Magnus Sinning, MD;  Location: [facility];  Service: Orthopedics;;  shaving of medial femeral  chondral  . DILATION AND CURETTAGE OF UTERUS    . HYSTEROSCOPY WITH D & C  2008   benign endometrial polyp - Dr. Maryelizabeth Rowan  . KNEE ARTHROSCOPY W/ MENISCECTOMY Bilateral 07/07/2010   1 year apart  . MENISECTOMY  08/19/2011   Procedure: MENISECTOMY;  Surgeon: Magnus Sinning, MD;  Location: [facility];  Service: Orthopedics;;  partial lateral menisectomy  . PORT-A-CATH REMOVAL N/A 12/21/2017   Procedure: REMOVAL PORT-A-CATH;  Surgeon: Alphonsa Overall, MD;  Location: [facility];  Service: General;  Laterality: N/A;  . PORTACATH PLACEMENT N/A 09/22/2016   Procedure: INSERTION PORT-A-CATH;  Surgeon: Alphonsa Overall, MD;  Location: [facility];  Service: General;  Laterality: N/A;  . TONSILLECTOMY    . TUBAL LIGATION  AGE 58    Social History:  reports that she quit smoking about 33 years ago. Her smoking use included cigarettes. She quit after 5.00 years of use. She has never used smokeless tobacco. She reports previous alcohol use. She reports that she does not use drugs. Family History:  Family History  Problem Relation Age of Onset  . Diabetes Mother   . Heart disease Father   . Cancer Brother 26       prostate  . Breast cancer Maternal Grandmother 88     HOME MEDICATIONS: Allergies as of 10/09/2019      Reactions   No Known Allergies       Medication List       Accurate as of October 09, 2019 11:10 AM. If you have any questions, ask your nurse or doctor.        anastrozole 1 MG tablet Commonly known as: ARIMIDEX Take 1 tablet (1 mg total) by mouth daily.   BD Pen Needle Nano U/F 32G X 4 MM Misc Generic drug: Insulin Pen Needle USE TO INJECT INSULIN DAILY.   blood glucose meter kit and supplies Kit Dispense based on patient and insurance preference. Use up to four times daily as directed. (FOR ICD-9 250.00, 250.01). Check blood sugar BID.   cyanocobalamin 100 MCG tablet Take 100 mcg by mouth daily.   Cyanocobalamin 1000 MCG/ML Kit Inject as directed.   FreeStyle  Libre 14 Day Reader Kerrin Mo 1 Act by Does not apply route daily.   FreeStyle Libre 14 Day Sensor Misc USE 1 SENSOR EVERY 14 DAYS   glucose blood test strip Use as instructed   losartan 50 MG tablet Commonly known as: COZAAR TAKE 1 TABLET BY MOUTH EVERY DAY   metFORMIN 500 MG tablet Commonly known as: GLUCOPHAGE Take 1 tablet (500 mg total) by mouth 2 (two) times daily with a meal. What changed:   when to take this  additional instructions   nystatin powder Commonly known as: MYCOSTATIN/NYSTOP Apply topically 3 (three) times daily. Apply to affected area for up to 7 days   nystatin-triamcinolone cream Commonly known as: MYCOLOG II  Apply 1 application topically 2 (two) times daily. Apply to affected area BID for up to 7 days.   rosuvastatin 20 MG tablet Commonly known as: CRESTOR TAKE 1 TABLET BY MOUTH EVERY DAY   Rybelsus 7 MG Tabs Generic drug: Semaglutide TAKE 1 TABLET BY MOUTH EVERY DAY   Tresiba FlexTouch 200 UNIT/ML FlexTouch Pen Generic drug: insulin degludec Inject 34 Units into the skin daily.        OBJECTIVE:   Vital Signs: BP 118/72 (BP Location: Right Arm, Patient Position: Sitting, Cuff Size: Large)   Pulse 85   Temp 98.1 F (36.7 C)   Ht _0  (1.549 m)   Wt 295 lb 3.2 oz (133.9 kg)   LMP 06/30/2003   SpO2 98%   BMI 55.78 kg/m   Wt Readings from Last 3 Encounters:  10/09/19 295 lb 3.2 oz (133.9 kg)  05/30/19 287 lb 6.4 oz (130.4 kg)  05/22/19 289 lb (131.1 kg)     Exam: General: Pt appears well and is in NAD  Lungs: Clear with good BS bilat with no rales, rhonchi, or wheezes  Heart: RRR with normal S1 and S2 and no gallops; no murmurs; no rub  Extremities: No pretibial edema.   Neuro: MS is good with appropriate affect, pt is alert and Ox3       DM foot exam: 10/09/2019 The skin of the feet is intact without sores or ulcerations.Plantar callous formation noted bilaterally The pedal pulses are 2+ on right and 2+ on left. The  sensation is intact to a screening 5.07, 10 gram monofilament bilaterally       DATA REVIEWED:  Lab Results  Component Value Date   HGBA1C 7.2 (A) 10/09/2019   HGBA1C 7.0 (H) 05/22/2019   HGBA1C 6.4 (A) 02/01/2019   Lab Results  Component Value Date   MICROALBUR 2.1 (H) 09/13/2018   LDLCALC 62 05/22/2019   CREATININE 0.62 05/22/2019   Lab Results  Component Value Date   MICRALBCREAT 2.3 09/13/2018     Lab Results  Component Value Date   CHOL 135 05/22/2019   HDL 44.60 05/22/2019   LDLCALC 62 05/22/2019   LDLDIRECT 177.0 09/13/2018   TRIG 143.0 05/22/2019   CHOLHDL 3 05/22/2019         ASSESSMENT / PLAN / RECOMMENDATIONS:   1) Type 2 Diabetes Mellitus, Optimally controlled, With Neuropathic complications - Most recent A1c of 7.2 %. Goal A1c < 7.0 %.     - Minimal worsening of hyperglycemia.  - In review of her CGM download, she was noted to have recurrent hypoglycemia. Pt believes the sensor is faulty as yesterday she her an "low alert " on her CGM but a concomitate BG was 126 mg/dL  - Intolerant to higher doses of Metformin - I am not going to make any changes today , but I have advised her to go with finger sticks from now on , especially in the fasting and to contact us with BG < 70 mg/dL and will reduce her insulin.      MEDICATIONS: - Continue  Tresiba at 34 Units daily  - Continue Metformin 500 mg with Supper  - Continue Rybelsus 7 mg daily    EDUCATION / INSTRUCTIONS:  BG monitoring instructions: Patient is instructed to check her blood sugars 2 times a day, fasting and bedtime.  Call [facility] if: BG persistently < 70 or > 300. . I reviewed the Rule of 15 for the treatment of hypoglycemia in detail  with the patient. Literature supplied.    F/U in 4 months    Signed electronically by: Mack Guise, MD  [facility]  [facility] [address], [place] [address] Phone:  [phone] FAX: [phone]   CC: Janith Lima, MD [place] [address] Phone: [phone]  Fax: [phone]  Return to Endocrinology clinic as below: Future Appointments  Date Time Provider [place]  01/25/2020  3:00 PM Nunzio Cobbs, MD [place] None  02/09/2020 10:30 AM Nicholas Lose, MD [facility] None  02/12/2020 10:50 AM Havoc Sanluis, Melanie Crazier, MD [facility] None

## 2019-10-09 NOTE — Patient Instructions (Signed)
-   Continue  tresiba  34 Units daily  - Take Metformin  500 mg with Supper  - Continue Rybelsus 7 mg daily   - If you notice sugar readings below 70 mg/dL on the finger stick, please contact us so we could adjust your medications      HOW TO TREAT LOW BLOOD SUGARS (Blood sugar LESS THAN 70 MG/DL)  Please follow the RULE OF 15 for the treatment of hypoglycemia treatment (when your (blood sugars are less than 70 mg/dL)    STEP 1: Take 15 grams of carbohydrates when your blood sugar is low, which includes:   3-4 GLUCOSE TABS  OR  3-4 OZ OF JUICE OR REGULAR SODA OR  ONE TUBE OF GLUCOSE GEL     STEP 2: RECHECK blood sugar in 15 MINUTES STEP 3: If your blood sugar is still low at the 15 minute recheck --> then, go back to STEP 1 and treat AGAIN with another 15 grams of carbohydrates.

## 2019-10-12 ENCOUNTER — Encounter: Payer: Self-pay | Admitting: Internal Medicine

## 2019-10-13 ENCOUNTER — Telehealth: Payer: Self-pay

## 2019-10-13 NOTE — Telephone Encounter (Signed)
Key: Sandra Wood

## 2019-10-19 ENCOUNTER — Other Ambulatory Visit: Payer: Self-pay

## 2019-10-19 ENCOUNTER — Ambulatory Visit (INDEPENDENT_AMBULATORY_CARE_PROVIDER_SITE_OTHER): Payer: Medicare Other | Admitting: *Deleted

## 2019-10-19 DIAGNOSIS — E538 Deficiency of other specified B group vitamins: Secondary | ICD-10-CM | POA: Diagnosis not present

## 2019-10-19 MED ORDER — CYANOCOBALAMIN 1000 MCG/ML IJ SOLN
1000.0000 ug | Freq: Once | INTRAMUSCULAR | Status: AC
  Administered 2019-10-19: 1000 ug via INTRAMUSCULAR

## 2019-10-19 NOTE — Progress Notes (Addendum)
Pls cosign for B12 inj../lmb I have reviewed and agree  

## 2019-11-05 ENCOUNTER — Other Ambulatory Visit: Payer: Self-pay | Admitting: Internal Medicine

## 2019-11-05 DIAGNOSIS — E1165 Type 2 diabetes mellitus with hyperglycemia: Secondary | ICD-10-CM

## 2019-11-05 DIAGNOSIS — IMO0002 Reserved for concepts with insufficient information to code with codable children: Secondary | ICD-10-CM

## 2019-11-05 DIAGNOSIS — I10 Essential (primary) hypertension: Secondary | ICD-10-CM

## 2019-11-05 DIAGNOSIS — E785 Hyperlipidemia, unspecified: Secondary | ICD-10-CM

## 2019-11-06 NOTE — Telephone Encounter (Signed)
Patient scheduled to see Dr Ronnald Ramp on 11/20/2019 at 10:40am for Diabetes Management and B12 injection.

## 2019-11-06 NOTE — Telephone Encounter (Signed)
Pt is due for follow up with PCP around May 25th. She has a nurse visit that week.   Can you call pt to see if she would schedule with Dr. Ronnald Ramp instead for DM follow up?

## 2019-11-07 NOTE — Telephone Encounter (Signed)
erx sent for 30 day to pof.

## 2019-11-20 ENCOUNTER — Other Ambulatory Visit: Payer: Self-pay | Admitting: Internal Medicine

## 2019-11-20 ENCOUNTER — Encounter: Payer: Self-pay | Admitting: Internal Medicine

## 2019-11-20 ENCOUNTER — Ambulatory Visit (INDEPENDENT_AMBULATORY_CARE_PROVIDER_SITE_OTHER): Payer: Medicare Other | Admitting: Internal Medicine

## 2019-11-20 ENCOUNTER — Ambulatory Visit: Payer: Medicare Other

## 2019-11-20 ENCOUNTER — Other Ambulatory Visit: Payer: Self-pay

## 2019-11-20 VITALS — BP 126/84 | HR 92 | Temp 98.1°F | Resp 16 | Ht 61.0 in | Wt 294.2 lb

## 2019-11-20 DIAGNOSIS — R87610 Atypical squamous cells of undetermined significance on cytologic smear of cervix (ASC-US): Secondary | ICD-10-CM

## 2019-11-20 DIAGNOSIS — Z1211 Encounter for screening for malignant neoplasm of colon: Secondary | ICD-10-CM

## 2019-11-20 DIAGNOSIS — I10 Essential (primary) hypertension: Secondary | ICD-10-CM

## 2019-11-20 DIAGNOSIS — IMO0002 Reserved for concepts with insufficient information to code with codable children: Secondary | ICD-10-CM

## 2019-11-20 DIAGNOSIS — E785 Hyperlipidemia, unspecified: Secondary | ICD-10-CM | POA: Diagnosis not present

## 2019-11-20 DIAGNOSIS — Z23 Encounter for immunization: Secondary | ICD-10-CM | POA: Diagnosis not present

## 2019-11-20 DIAGNOSIS — E118 Type 2 diabetes mellitus with unspecified complications: Secondary | ICD-10-CM

## 2019-11-20 DIAGNOSIS — E538 Deficiency of other specified B group vitamins: Secondary | ICD-10-CM

## 2019-11-20 DIAGNOSIS — E1165 Type 2 diabetes mellitus with hyperglycemia: Secondary | ICD-10-CM

## 2019-11-20 DIAGNOSIS — G63 Polyneuropathy in diseases classified elsewhere: Secondary | ICD-10-CM

## 2019-11-20 DIAGNOSIS — D72829 Elevated white blood cell count, unspecified: Secondary | ICD-10-CM

## 2019-11-20 LAB — CBC WITH DIFFERENTIAL/PLATELET
Basophils Absolute: 0.1 10*3/uL (ref 0.0–0.1)
Basophils Relative: 0.5 % (ref 0.0–3.0)
Eosinophils Absolute: 0.4 10*3/uL (ref 0.0–0.7)
Eosinophils Relative: 3.1 % (ref 0.0–5.0)
HCT: 43 % (ref 36.0–46.0)
Hemoglobin: 14 g/dL (ref 12.0–15.0)
Lymphocytes Relative: 24.7 % (ref 12.0–46.0)
Lymphs Abs: 2.9 10*3/uL (ref 0.7–4.0)
MCHC: 32.5 g/dL (ref 30.0–36.0)
MCV: 81.1 fl (ref 78.0–100.0)
Monocytes Absolute: 1 10*3/uL (ref 0.1–1.0)
Monocytes Relative: 8.7 % (ref 3.0–12.0)
Neutro Abs: 7.5 10*3/uL (ref 1.4–7.7)
Neutrophils Relative %: 63 % (ref 43.0–77.0)
Platelets: 374 10*3/uL (ref 150.0–400.0)
RBC: 5.3 Mil/uL — ABNORMAL HIGH (ref 3.87–5.11)
RDW: 15.4 % (ref 11.5–15.5)
WBC: 11.9 10*3/uL — ABNORMAL HIGH (ref 4.0–10.5)

## 2019-11-20 LAB — BASIC METABOLIC PANEL
BUN: 13 mg/dL (ref 6–23)
CO2: 26 mEq/L (ref 19–32)
Calcium: 9.7 mg/dL (ref 8.4–10.5)
Chloride: 104 mEq/L (ref 96–112)
Creatinine, Ser: 0.74 mg/dL (ref 0.40–1.20)
GFR: 78.73 mL/min (ref >60.00)
Glucose, Bld: 124 mg/dL — ABNORMAL HIGH (ref 70–99)
Potassium: 4 mEq/L (ref 3.5–5.1)
Sodium: 141 mEq/L (ref 135–145)

## 2019-11-20 LAB — FOLATE: Folate: 16.7 ng/mL (ref >5.9)

## 2019-11-20 LAB — TSH: TSH: 3.96 u[IU]/mL (ref 0.35–4.50)

## 2019-11-20 MED ORDER — LOSARTAN POTASSIUM 50 MG PO TABS: 50.0000 mg | ORAL_TABLET | Freq: Every day | ORAL | 1 refills | Status: DC

## 2019-11-20 MED ORDER — ROSUVASTATIN CALCIUM 20 MG PO TABS: 20.0000 mg | ORAL_TABLET | Freq: Every day | ORAL | 1 refills | Status: DC

## 2019-11-20 MED ORDER — CYANOCOBALAMIN 1000 MCG/ML IJ SOLN
1000.0000 ug | Freq: Once | INTRAMUSCULAR | Status: AC
  Administered 2019-11-20: 1000 ug via INTRAMUSCULAR

## 2019-11-20 NOTE — Progress Notes (Signed)
Subjective:  Patient ID: Sandra Wood, female    DOB: 1956/06/27  Age: 64 y.o. MRN: [id number]  CC: Hypertension and Diabetes  This visit occurred during the SARS-CoV-2 public health emergency.  Safety protocols were in place, including screening questions prior to the visit, additional usage of staff PPE, and extensive cleaning of exam room while observing appropriate contact time as indicated for disinfecting solutions.    HPI BENISHA HADAWAY presents for f/up - She is active and denies any recent episodes of chest pain, shortness of breath, palpitations, edema, or fatigue.  She tells me her blood pressure and blood sugar have been adequately well controlled.  Outpatient Medications Prior to Visit  Medication Sig Dispense Refill  . anastrozole (ARIMIDEX) 1 MG tablet Take 1 tablet (1 mg total) by mouth daily. 90 tablet 3  . blood glucose meter kit and supplies KIT Dispense based on patient and insurance preference. Use up to four times daily as directed. (FOR ICD-9 250.00, 250.01). Check blood sugar BID. 1 each 0  . Continuous Blood Gluc Receiver (FREESTYLE LIBRE 14 DAY READER) DEVI 1 Act by Does not apply route daily. 1 Device 11  . Continuous Blood Gluc Sensor (FREESTYLE LIBRE 14 DAY SENSOR) MISC USE 1 SENSOR EVERY 14 DAYS 2 each 11  . cyanocobalamin 100 MCG tablet Take 100 mcg by mouth daily.    . Cyanocobalamin 1000 MCG/ML KIT Inject as directed.    Marland Kitchen glucose blood test strip Use as instructed 100 each 12  . insulin degludec (TRESIBA FLEXTOUCH) 200 UNIT/ML FlexTouch Pen Inject 34 Units into the skin daily. 15 mL 11  . Insulin Pen Needle (BD PEN NEEDLE NANO U/F) 32G X 4 MM MISC USE TO INJECT INSULIN DAILY. 100 each 1  . metFORMIN (GLUCOPHAGE) 500 MG tablet Take 1 tablet (500 mg total) by mouth daily with supper. 90 tablet 3  . nystatin (MYCOSTATIN/NYSTOP) powder Apply topically 3 (three) times daily. Apply to affected area for up to 7 days 45 g 1  . nystatin-triamcinolone  (MYCOLOG II) cream Apply 1 application topically 2 (two) times daily. Apply to affected area BID for up to 7 days. 60 g 0  . Semaglutide (RYBELSUS) 7 MG TABS Take 1 tablet by mouth daily. 90 tablet 1  . losartan (COZAAR) 50 MG tablet TAKE 1 TABLET BY MOUTH EVERY DAY 30 tablet 0  . rosuvastatin (CRESTOR) 20 MG tablet TAKE 1 TABLET BY MOUTH EVERY DAY 30 tablet 0   No facility-administered medications prior to visit.    ROS Review of Systems  Constitutional: Negative.  Negative for appetite change, diaphoresis, fatigue and unexpected weight change.  HENT: Negative.   Eyes: Negative for visual disturbance.  Respiratory: Negative for cough, chest tightness, shortness of breath and wheezing.   Cardiovascular: Negative for chest pain, palpitations and leg swelling.  Gastrointestinal: Negative for abdominal pain, constipation, diarrhea, nausea and vomiting.  Endocrine: Negative.   Genitourinary: Negative.  Negative for difficulty urinating.  Musculoskeletal: Negative.  Negative for arthralgias and myalgias.  Skin: Negative.  Negative for color change.  Neurological: Negative.  Negative for dizziness, weakness and headaches.  Hematological: Negative for adenopathy. Does not bruise/bleed easily.  Psychiatric/Behavioral: Negative.     Objective:  BP 126/84 (BP Location: Left Arm, Patient Position: Sitting, Cuff Size: Large)   Pulse 92   Temp 98.1 F (36.7 C) (Oral)   Resp 16   Ht '5\' 1"'$  (1.549 m)   Wt 294 lb 4 oz (133.5 kg)  LMP 06/30/2003   SpO2 90%   BMI 55.60 kg/m   BP Readings from Last 3 Encounters:  11/20/19 126/84  10/09/19 118/72  05/30/19 122/64    Wt Readings from Last 3 Encounters:  11/20/19 294 lb 4 oz (133.5 kg)  10/09/19 295 lb 3.2 oz (133.9 kg)  05/30/19 287 lb 6.4 oz (130.4 kg)    Physical Exam Vitals reviewed.  Constitutional:      Appearance: She is obese.  HENT:     Nose: Nose normal.     Mouth/Throat:     Mouth: Mucous membranes are moist.  Eyes:      General: No scleral icterus.    Conjunctiva/sclera: Conjunctivae normal.  Cardiovascular:     Rate and Rhythm: Normal rate and regular rhythm.     Heart sounds: No murmur.  Pulmonary:     Effort: Pulmonary effort is normal.     Breath sounds: No stridor. No wheezing, rhonchi or rales.  Abdominal:     General: Abdomen is protuberant. Bowel sounds are normal. There is no distension.     Palpations: Abdomen is soft. There is no hepatomegaly, splenomegaly or mass.     Tenderness: There is no abdominal tenderness.  Musculoskeletal:        General: Normal range of motion.     Cervical back: Neck supple.     Right lower leg: No edema.     Left lower leg: No edema.  Lymphadenopathy:     Cervical: No cervical adenopathy.  Skin:    General: Skin is warm and dry.  Neurological:     General: No focal deficit present.     Mental Status: She is alert.  Psychiatric:        Mood and Affect: Mood normal.        Behavior: Behavior normal.     Lab Results  Component Value Date   WBC 11.9 (H) 11/20/2019   HGB 14.0 11/20/2019   HCT 43.0 11/20/2019   PLT 374.0 11/20/2019   GLUCOSE 124 (H) 11/20/2019   CHOL 135 05/22/2019   TRIG 143.0 05/22/2019   HDL 44.60 05/22/2019   LDLDIRECT 177.0 09/13/2018   LDLCALC 62 05/22/2019   ALT 17 09/13/2018   AST 14 09/13/2018   NA 141 11/20/2019   K 4.0 11/20/2019   CL 104 11/20/2019   CREATININE 0.74 11/20/2019   BUN 13 11/20/2019   CO2 26 11/20/2019   TSH 3.96 11/20/2019   INR 1.1 RATIO 07/09/2006   HGBA1C 7.2 (A) 10/09/2019   MICROALBUR 2.1 (H) 09/13/2018    US BREAST LTD UNI LEFT INC AXILLA  Result Date: 02/01/2019 CLINICAL DATA:  64 year old female with history left breast cancer post lumpectomy 09/22/2016. Patient presents today with pain involving the upper right breast as well as pain and a palpable abnormality in the left breast. EXAM: DIGITAL DIAGNOSTIC BILATERAL MAMMOGRAM WITH CAD AND TOMO BILATERAL BREAST ULTRASOUND COMPARISON:   Previous exam(s). ACR Breast Density Category c: The breast tissue is heterogeneously dense, which may obscure small masses. FINDINGS: No suspicious masses or calcifications are seen in either breast. Stable lumpectomy changes in the lower central left breast. Spot compression tangential tomograms were performed over the palpable area of concern in the inner left breast with no abnormalities identified. Mammographic images were processed with CAD. Physical examination of the upper right breast in the region of pain does not reveal any palpable masses. Physical examination of the upper-outer quadrant of the left breast in the region of pain  does not reveal any palpable masses. Physical examination in the inner left breast in the region of palpable concern reveals a band like area of thickening however no underlying palpable masses. Targeted ultrasound of the superior right breast was performed. No suspicious masses or abnormality seen, only heterogeneous fibroglandular tissue identified. Targeted ultrasound of upper-outer left breast in the region of pain and in the inner left breast in the region of palpable concern was performed. No suspicious masses or abnormality seen, only heterogeneous fibroglandular tissue identified. IMPRESSION: 1. No mammographic or sonographic abnormalities at the site of pain in the right breast or sites of pain and palpable abnormality in the left breast. 2.  No findings of malignancy in either breast. RECOMMENDATION: 1. Recommend further management of nonfocal bilateral breast pain as well as left breast palpable area of concern be based on clinical assessment. 2. Patient will be due for bilateral diagnostic mammography with magnification view of the left breast lumpectomy site April 2021. I have discussed the findings and recommendations with the patient. Results were also provided in writing at the conclusion of the visit. If applicable, a reminder letter will be sent to the patient  regarding the next appointment. BI-RADS CATEGORY  2: Benign. Electronically Signed   By: Everlean Alstrom M.D.   On: 02/01/2019 10:19   US BREAST LTD UNI RIGHT INC AXILLA  Result Date: 02/01/2019 CLINICAL DATA:  64 year old female with history left breast cancer post lumpectomy 09/22/2016. Patient presents today with pain involving the upper right breast as well as pain and a palpable abnormality in the left breast. EXAM: DIGITAL DIAGNOSTIC BILATERAL MAMMOGRAM WITH CAD AND TOMO BILATERAL BREAST ULTRASOUND COMPARISON:  Previous exam(s). ACR Breast Density Category c: The breast tissue is heterogeneously dense, which may obscure small masses. FINDINGS: No suspicious masses or calcifications are seen in either breast. Stable lumpectomy changes in the lower central left breast. Spot compression tangential tomograms were performed over the palpable area of concern in the inner left breast with no abnormalities identified. Mammographic images were processed with CAD. Physical examination of the upper right breast in the region of pain does not reveal any palpable masses. Physical examination of the upper-outer quadrant of the left breast in the region of pain does not reveal any palpable masses. Physical examination in the inner left breast in the region of palpable concern reveals a band like area of thickening however no underlying palpable masses. Targeted ultrasound of the superior right breast was performed. No suspicious masses or abnormality seen, only heterogeneous fibroglandular tissue identified. Targeted ultrasound of upper-outer left breast in the region of pain and in the inner left breast in the region of palpable concern was performed. No suspicious masses or abnormality seen, only heterogeneous fibroglandular tissue identified. IMPRESSION: 1. No mammographic or sonographic abnormalities at the site of pain in the right breast or sites of pain and palpable abnormality in the left breast. 2.  No findings  of malignancy in either breast. RECOMMENDATION: 1. Recommend further management of nonfocal bilateral breast pain as well as left breast palpable area of concern be based on clinical assessment. 2. Patient will be due for bilateral diagnostic mammography with magnification view of the left breast lumpectomy site April 2021. I have discussed the findings and recommendations with the patient. Results were also provided in writing at the conclusion of the visit. If applicable, a reminder letter will be sent to the patient regarding the next appointment. BI-RADS CATEGORY  2: Benign. Electronically Signed   By: Anderson Malta  Shelly Bombard M.D.   On: 02/01/2019 10:19   MM DIAG BREAST TOMO BILATERAL  Result Date: 02/01/2019 CLINICAL DATA:  64 year old female with history left breast cancer post lumpectomy 09/22/2016. Patient presents today with pain involving the upper right breast as well as pain and a palpable abnormality in the left breast. EXAM: DIGITAL DIAGNOSTIC BILATERAL MAMMOGRAM WITH CAD AND TOMO BILATERAL BREAST ULTRASOUND COMPARISON:  Previous exam(s). ACR Breast Density Category c: The breast tissue is heterogeneously dense, which may obscure small masses. FINDINGS: No suspicious masses or calcifications are seen in either breast. Stable lumpectomy changes in the lower central left breast. Spot compression tangential tomograms were performed over the palpable area of concern in the inner left breast with no abnormalities identified. Mammographic images were processed with CAD. Physical examination of the upper right breast in the region of pain does not reveal any palpable masses. Physical examination of the upper-outer quadrant of the left breast in the region of pain does not reveal any palpable masses. Physical examination in the inner left breast in the region of palpable concern reveals a band like area of thickening however no underlying palpable masses. Targeted ultrasound of the superior right breast was  performed. No suspicious masses or abnormality seen, only heterogeneous fibroglandular tissue identified. Targeted ultrasound of upper-outer left breast in the region of pain and in the inner left breast in the region of palpable concern was performed. No suspicious masses or abnormality seen, only heterogeneous fibroglandular tissue identified. IMPRESSION: 1. No mammographic or sonographic abnormalities at the site of pain in the right breast or sites of pain and palpable abnormality in the left breast. 2.  No findings of malignancy in either breast. RECOMMENDATION: 1. Recommend further management of nonfocal bilateral breast pain as well as left breast palpable area of concern be based on clinical assessment. 2. Patient will be due for bilateral diagnostic mammography with magnification view of the left breast lumpectomy site April 2021. I have discussed the findings and recommendations with the patient. Results were also provided in writing at the conclusion of the visit. If applicable, a reminder letter will be sent to the patient regarding the next appointment. BI-RADS CATEGORY  2: Benign. Electronically Signed   By: Everlean Alstrom M.D.   On: 02/01/2019 10:19    Assessment & Plan:   Dianely was seen today for hypertension and diabetes.  Diagnoses and all orders for this visit:  Essential hypertension- Her BP is well controlled. -     CBC with Differential/Platelet; Future -     Basic metabolic panel; Future -     TSH; Future -     TSH -     Basic metabolic panel -     CBC with Differential/Platelet -     Discontinue: losartan (COZAAR) 50 MG tablet; Take 1 tablet (50 mg total) by mouth daily. -     losartan (COZAAR) 50 MG tablet; Take 1 tablet (50 mg total) by mouth daily.  Vitamin B12 deficiency neuropathy (HCC) -     CBC with Differential/Platelet; Future -     Folate; Future -     Folate -     CBC with Differential/Platelet -     cyanocobalamin ((VITAMIN B-12)) injection 1,000  mcg  Screening for colon cancer  Atypical squamous cells of undetermined significance on cytologic smear of cervix (ASC-US) -     Ambulatory referral to Gynecology  Need for pneumococcal vaccination -     Pneumococcal conjugate vaccine 13-valent  Colon cancer screening -  Cologuard  Hyperlipidemia LDL goal <100 -     Discontinue: rosuvastatin (CRESTOR) 20 MG tablet; Take 1 tablet (20 mg total) by mouth daily. -     rosuvastatin (CRESTOR) 20 MG tablet; Take 1 tablet (20 mg total) by mouth daily.  Type II diabetes mellitus with manifestations, uncontrolled ([place])- Her blood sugar is adequately well controlled. -     Discontinue: losartan (COZAAR) 50 MG tablet; Take 1 tablet (50 mg total) by mouth daily. -     losartan (COZAAR) 50 MG tablet; Take 1 tablet (50 mg total) by mouth daily.  Leukocytosis, unspecified type- She has had a mild, stable elevation in her white cell count for at least the last 2 years.  She is not anemic and her other cell lines are normal.  She is asymptomatic with this.  This is likely a benign entity and I will continue to follow it.   I am having Jesenia L. Adcox maintain her blood glucose meter kit and supplies, glucose blood, nystatin-triamcinolone, FreeStyle Libre 14 Day Reader, cyanocobalamin, Cyanocobalamin, nystatin, anastrozole, FreeStyle Libre 14 Day Sensor, BD Pen Needle Nano U/F, metFORMIN, Rybelsus, Tresiba FlexTouch, losartan, and rosuvastatin. We administered cyanocobalamin.  Meds ordered this encounter  Medications  . cyanocobalamin ((VITAMIN B-12)) injection 1,000 mcg  . DISCONTD: rosuvastatin (CRESTOR) 20 MG tablet    Sig: Take 1 tablet (20 mg total) by mouth daily.    Dispense:  90 tablet    Refill:  1  . DISCONTD: losartan (COZAAR) 50 MG tablet    Sig: Take 1 tablet (50 mg total) by mouth daily.    Dispense:  90 tablet    Refill:  1  . losartan (COZAAR) 50 MG tablet    Sig: Take 1 tablet (50 mg total) by mouth daily.    Dispense:   90 tablet    Refill:  1  . rosuvastatin (CRESTOR) 20 MG tablet    Sig: Take 1 tablet (20 mg total) by mouth daily.    Dispense:  90 tablet    Refill:  1     Follow-up: Return in about 6 months (around 05/22/2020).  Scarlette Calico, MD

## 2019-11-20 NOTE — Patient Instructions (Signed)

## 2019-11-21 ENCOUNTER — Encounter: Payer: Self-pay | Admitting: Internal Medicine

## 2019-11-21 DIAGNOSIS — Z1211 Encounter for screening for malignant neoplasm of colon: Secondary | ICD-10-CM | POA: Insufficient documentation

## 2019-11-21 DIAGNOSIS — D72829 Elevated white blood cell count, unspecified: Secondary | ICD-10-CM | POA: Insufficient documentation

## 2019-11-21 DIAGNOSIS — Z23 Encounter for immunization: Secondary | ICD-10-CM | POA: Insufficient documentation

## 2019-12-15 ENCOUNTER — Other Ambulatory Visit: Payer: Self-pay | Admitting: Obstetrics and Gynecology

## 2019-12-15 DIAGNOSIS — Z853 Personal history of malignant neoplasm of breast: Secondary | ICD-10-CM

## 2019-12-15 DIAGNOSIS — Z9889 Other specified postprocedural states: Secondary | ICD-10-CM

## 2019-12-18 ENCOUNTER — Ambulatory Visit (INDEPENDENT_AMBULATORY_CARE_PROVIDER_SITE_OTHER): Payer: Medicare Other

## 2019-12-18 ENCOUNTER — Other Ambulatory Visit: Payer: Self-pay

## 2019-12-18 DIAGNOSIS — E538 Deficiency of other specified B group vitamins: Secondary | ICD-10-CM | POA: Diagnosis not present

## 2019-12-18 DIAGNOSIS — G63 Polyneuropathy in diseases classified elsewhere: Secondary | ICD-10-CM | POA: Diagnosis not present

## 2019-12-18 MED ORDER — CYANOCOBALAMIN 1000 MCG/ML IJ SOLN
1000.0000 ug | Freq: Once | INTRAMUSCULAR | Status: AC
  Administered 2019-12-18: 1000 ug via INTRAMUSCULAR

## 2019-12-18 NOTE — Progress Notes (Signed)
Pt was given vit F01 w/o complications.

## 2020-01-18 ENCOUNTER — Other Ambulatory Visit: Payer: Self-pay

## 2020-01-18 ENCOUNTER — Ambulatory Visit (INDEPENDENT_AMBULATORY_CARE_PROVIDER_SITE_OTHER): Payer: Medicare Other | Admitting: *Deleted

## 2020-01-18 DIAGNOSIS — E538 Deficiency of other specified B group vitamins: Secondary | ICD-10-CM

## 2020-01-18 MED ORDER — CYANOCOBALAMIN 1000 MCG/ML IJ SOLN
1000.0000 ug | Freq: Once | INTRAMUSCULAR | Status: AC
  Administered 2020-01-18: 1000 ug via INTRAMUSCULAR

## 2020-01-18 NOTE — Progress Notes (Signed)
Pls cosign for B12 inj../lmb  

## 2020-01-25 ENCOUNTER — Telehealth: Payer: Self-pay

## 2020-01-25 ENCOUNTER — Ambulatory Visit: Payer: No Typology Code available for payment source | Admitting: Obstetrics and Gynecology

## 2020-01-25 NOTE — Telephone Encounter (Signed)
Patient cancelled AEX today (01/25/20) due to having an accident, patient stated she fell and does not feel comfortable coming in. Patient rescheduled appointment for (02/06/20).

## 2020-01-26 NOTE — Telephone Encounter (Signed)
Thank you for the update!

## 2020-02-02 ENCOUNTER — Other Ambulatory Visit: Payer: Self-pay

## 2020-02-02 ENCOUNTER — Ambulatory Visit
Admission: RE | Admit: 2020-02-02 | Discharge: 2020-02-02 | Disposition: A | Discharge status: Home / Self Care | Payer: Medicare Other | Source: Ambulatory Visit | Attending: Obstetrics and Gynecology | Admitting: Obstetrics and Gynecology

## 2020-02-02 DIAGNOSIS — Z9889 Other specified postprocedural states: Secondary | ICD-10-CM

## 2020-02-02 DIAGNOSIS — Z853 Personal history of malignant neoplasm of breast: Secondary | ICD-10-CM

## 2020-02-02 LAB — HM MAMMOGRAPHY

## 2020-02-02 NOTE — Progress Notes (Signed)
64 y.o. G23P2002 Divorced Caucasian female here for annual exam.    Denies any vaginal bleeding or spotting.   Sees oncology this week.  No breast concerns this year.   Has plenty of Nystatin powder at home.  A1C is 7.2.  Caring for grandchildren.   Received her Covid vaccine Nov 03, 2019.  PCP:   Scarlette Calico, MD  Patient's last menstrual period was 06/30/2003.           Sexually active: No.  The current method of family planning is tubal ligation and post menopausal status.  Exercising: No.  The patient does not participate in regular exercise at present. Smoker:  Former  Health Maintenance:  Pap:  07/27/16 Neg:Neg HR HPV History of abnormal Pap:  Yes--Hx of ASCUS per patient MMG:  02/02/20 Diag.Bil/Neg/density B/Birads2--Diag.in 1 yr. Colonoscopy: NEVER BMD:   05/18/17  Result  Osteopenia oncology following   TDaP:  03/31/12 Gardasil:   no HIV:09/13/18  Negative   Hep C  09/13/18  Negative: Screening Labs:  PCP   reports that she quit smoking about 33 years ago. Her smoking use included cigarettes. She quit after 5.00 years of use. She has never used smokeless tobacco. She reports previous alcohol use. She reports that she does not use drugs.  Past Medical History:  Diagnosis Date  . Abnormal Pap smear of cervix 2009  . Allergic rhinitis, cause unspecified   . Arthritis   . Arthritis of ankle BILATERAL / HX FX'S  . Asthma, mild persistent   . Breast cancer ([place])   . Dyspnea    with much activity  . Fibroids 2008  . GERD (gastroesophageal reflux disease)    not current  . History of radiation therapy 01/27/17-03/16/17   left breast 1.8 Gy in 28 fractions, left breast boost 2 Gy in 6 fractions  . HSV-2 (herpes simplex virus 2) infection    at age 86  . Malignant neoplasm of lower-inner quadrant of left breast in female, estrogen receptor positive ([place]) 08/20/2016  . Obesity, morbid ([place])   . Personal history of chemotherapy   . Personal history of radiation therapy   .  Pneumonia    2013, 2017  . Tear of medial meniscus of knee LEFT  . Type II or unspecified type diabetes mellitus without mention of complication, uncontrolled    Type II- has never been on medication    Past Surgical History:  Procedure Laterality Date  . BREAST BIOPSY    . BREAST LUMPECTOMY Left 09/22/2016  . BREAST LUMPECTOMY WITH RADIOACTIVE SEED AND SENTINEL LYMPH NODE BIOPSY Left 09/22/2016   Procedure: BREAST LUMPECTOMY WITH RADIOACTIVE SEED AND LEFT AXILLARY SENTINEL LYMPH NODE BIOPSY;  Surgeon: Alphonsa Overall, MD;  Location: [facility];  Service: General;  Laterality: Left;  . CHONDROPLASTY  08/19/2011   Procedure: CHONDROPLASTY;  Surgeon: Magnus Sinning, MD;  Location: [facility];  Service: Orthopedics;;  shaving of medial femeral chondral  . DILATION AND CURETTAGE OF UTERUS    . HYSTEROSCOPY WITH D & C  2008   benign endometrial polyp - Dr. Maryelizabeth Rowan  . KNEE ARTHROSCOPY W/ MENISCECTOMY Bilateral 07/07/2010   1 year apart  . MENISECTOMY  08/19/2011   Procedure: MENISECTOMY;  Surgeon: Magnus Sinning, MD;  Location: [facility];  Service: Orthopedics;;  partial lateral menisectomy  . PORT-A-CATH REMOVAL N/A 12/21/2017   Procedure: REMOVAL PORT-A-CATH;  Surgeon: Alphonsa Overall, MD;  Location: [facility];  Service: General;  Laterality: N/A;  .  PORTACATH PLACEMENT N/A 09/22/2016   Procedure: INSERTION PORT-A-CATH;  Surgeon: Alphonsa Overall, MD;  Location: [facility];  Service: General;  Laterality: N/A;  . TONSILLECTOMY    . TUBAL LIGATION  AGE 69    Current Outpatient Medications  Medication Sig Dispense Refill  . anastrozole (ARIMIDEX) 1 MG tablet Take 1 tablet (1 mg total) by mouth daily. 90 tablet 3  . blood glucose meter kit and supplies KIT Dispense based on patient and insurance preference. Use up to four times daily as directed. (FOR ICD-9 250.00, 250.01). Check blood sugar BID. 1 each 0  . Continuous Blood Gluc Receiver (FREESTYLE LIBRE 14 DAY READER) DEVI 1  Act by Does not apply route daily. 1 Device 11  . Continuous Blood Gluc Sensor (FREESTYLE LIBRE 14 DAY SENSOR) MISC USE 1 SENSOR EVERY 14 DAYS 2 each 11  . cyanocobalamin 100 MCG tablet Take 100 mcg by mouth daily.    . Cyanocobalamin 1000 MCG/ML KIT Inject as directed.    Marland Kitchen glucose blood test strip Use as instructed 100 each 12  . insulin degludec (TRESIBA FLEXTOUCH) 200 UNIT/ML FlexTouch Pen Inject 34 Units into the skin daily. 15 mL 11  . Insulin Pen Needle (BD PEN NEEDLE NANO U/F) 32G X 4 MM MISC USE TO INJECT INSULIN DAILY. 100 each 1  . losartan (COZAAR) 50 MG tablet Take 1 tablet (50 mg total) by mouth daily. 90 tablet 1  . metFORMIN (GLUCOPHAGE) 500 MG tablet Take 1 tablet (500 mg total) by mouth daily with supper. 90 tablet 3  . nystatin (MYCOSTATIN/NYSTOP) powder Apply topically 3 (three) times daily. Apply to affected area for up to 7 days 45 g 1  . nystatin-triamcinolone (MYCOLOG II) cream Apply 1 application topically 2 (two) times daily. Apply to affected area BID for up to 7 days. 60 g 0  . rosuvastatin (CRESTOR) 20 MG tablet Take 1 tablet (20 mg total) by mouth daily. 90 tablet 1  . Semaglutide (RYBELSUS) 7 MG TABS Take 1 tablet by mouth daily. 90 tablet 1   No current facility-administered medications for this visit.    Family History  Problem Relation Age of Onset  . Diabetes Mother   . Heart disease Father   . Cancer Brother 37       prostate  . Breast cancer Maternal Grandmother 26    Review of Systems  All other systems reviewed and are negative.   Exam:   BP 140/78 (Cuff Size: Large)   Pulse 100   Resp (!) 22   Ht 5' 1.5" (1.562 m)   Wt (!) 303 lb 12.8 oz (137.8 kg)   LMP 06/30/2003   BMI 56.47 kg/m     General appearance: alert, cooperative and appears stated age Head: normocephalic, without obvious abnormality, atraumatic Neck: no adenopathy, supple, symmetrical, trachea midline and thyroid normal to inspection and palpation Lungs: clear to  auscultation bilaterally Breasts: normal appearance of right breast and scar of left breast, no masses or tenderness, No nipple retraction or dimpling, No nipple discharge or bleeding, No axillary adenopathy Heart: regular rate and rhythm Abdomen: soft, non-tender; no masses, no organomegaly Extremities: extremities normal, atraumatic, no cyanosis or edema Skin: skin color, texture, turgor normal. No rashes or lesions Lymph nodes: cervical, supraclavicular, and axillary nodes normal. Neurologic: grossly normal  Pelvic: External genitalia:  no lesions              No abnormal inguinal nodes palpated.  Urethra:  normal appearing urethra with no masses, tenderness or lesions              Bartholins and Skenes: normal                 Vagina: normal appearing vagina with normal color and discharge, no lesions              Cervix: no lesions              Pap taken: Yes.   Bimanual Exam:  Uterus:  normal size, contour, position, consistency, mobility, non-tender              Adnexa: no mass, fullness, tenderness              Rectal exam: Yes.  .  Confirms.              Anus:  normal sphincter tone, no lesions  Chaperone was present for exam.  Assessment:   Well woman visit with normal exam. Left breast cancer.  Status post lumpectomy, XRT and chemotherapy. On Arimidex. DM.  Hx HSV. Osteopenia.  Candida of flexural folds.  Declines Nystatin.   Plan: Mammogram screening discussed. Self breast awareness reviewed. Pap and HR HPV as above. Guidelines for Calcium, Vitamin D, regular exercise program including cardiovascular and weight bearing exercise. BMD may be due. Oncology following.  She will reach out to her PCP regarding her colonoscopy scheduling.  Follow up annually and prn.   After visit summary provided.

## 2020-02-06 ENCOUNTER — Other Ambulatory Visit: Payer: Self-pay

## 2020-02-06 ENCOUNTER — Encounter: Payer: Self-pay | Admitting: Obstetrics and Gynecology

## 2020-02-06 ENCOUNTER — Ambulatory Visit (INDEPENDENT_AMBULATORY_CARE_PROVIDER_SITE_OTHER): Payer: Medicare Other | Admitting: Obstetrics and Gynecology

## 2020-02-06 ENCOUNTER — Other Ambulatory Visit (HOSPITAL_COMMUNITY)
Admission: RE | Admit: 2020-02-06 | Discharge: 2020-02-06 | Disposition: A | Discharge status: Home / Self Care | Payer: Medicare Other | Source: Ambulatory Visit | Attending: Obstetrics and Gynecology | Admitting: Obstetrics and Gynecology

## 2020-02-06 VITALS — BP 140/78 | HR 100 | Resp 22 | Ht 61.5 in | Wt 303.8 lb

## 2020-02-06 DIAGNOSIS — Z01419 Encounter for gynecological examination (general) (routine) without abnormal findings: Secondary | ICD-10-CM

## 2020-02-06 DIAGNOSIS — Z124 Encounter for screening for malignant neoplasm of cervix: Secondary | ICD-10-CM | POA: Diagnosis not present

## 2020-02-06 LAB — HM PAP SMEAR

## 2020-02-06 NOTE — Patient Instructions (Signed)

## 2020-02-07 LAB — CYTOLOGY - PAP: Diagnosis: NEGATIVE

## 2020-02-09 ENCOUNTER — Inpatient Hospital Stay: Payer: Medicare Other | Attending: Hematology and Oncology | Admitting: Hematology and Oncology

## 2020-02-09 ENCOUNTER — Other Ambulatory Visit: Payer: Self-pay

## 2020-02-09 DIAGNOSIS — Z170 Estrogen receptor positive status [ER+]: Secondary | ICD-10-CM | POA: Diagnosis not present

## 2020-02-09 DIAGNOSIS — Z923 Personal history of irradiation: Secondary | ICD-10-CM | POA: Insufficient documentation

## 2020-02-09 DIAGNOSIS — Z9221 Personal history of antineoplastic chemotherapy: Secondary | ICD-10-CM | POA: Insufficient documentation

## 2020-02-09 DIAGNOSIS — C50312 Malignant neoplasm of lower-inner quadrant of left female breast: Secondary | ICD-10-CM | POA: Diagnosis present

## 2020-02-09 DIAGNOSIS — Z79811 Long term (current) use of aromatase inhibitors: Secondary | ICD-10-CM | POA: Insufficient documentation

## 2020-02-09 MED ORDER — ANASTROZOLE 1 MG PO TABS: 1.0000 mg | ORAL_TABLET | Freq: Every day | ORAL | 3 refills | Status: DC

## 2020-02-09 NOTE — Assessment & Plan Note (Signed)
Left breast biopsy02/20/2018:8:00 retroareolar position: IDC, high-grade with DCIS high-grade, ER 90%, PR 95%, Ki-67 20%, HER-2 positive ratio 3.4; screening detected left breast distortion LIQ 8 8:00 retroareolar 0.8 cm, axilla negative, T1b N0 stage IA clinical stage Left lumpectomy 09/22/2016: IDC grade 3, 1.3 cm, DCIS high-grade, margins clear, 0/1 lymph node negative, T1 CN 0 stage IA, ER 90%, PR 95%, Ki-67 20%, HER-2 positive ratio 3.4  Treatment summary: 1. Adjuvant chemotherapy and Herceptin with weekly Taxol and Herceptin 12 completed 01/04/2017 followed by Herceptin maintenance for 1 year completed 10/04/2017 3.Adjuvant radiation8/06/2016-03/16/2017 4.Adjuvant antiestrogen therapy with letrozole 2.5 mg daily 5 yearsstarted 05/10/2017 ----------------------------------------------------------------------------- Diabetes:No change in the current plan  Anastrozoletoxicities: Intermittent hot flashes Denies any arthralgias or myalgias   Breast cancer surveillance: 1.Mammogram and ultrasound 02/02/2020: Benign, breast density category B 2.breast exam  02/09/2020: Benign  Return to clinic in 1 year for follow-up. I sent a prescription refill for anastrozole.

## 2020-02-09 NOTE — Progress Notes (Signed)
Patient Care Team: Janith Lima, MD as PCP - General (Internal Medicine) Alphonsa Overall, MD as Consulting Physician (General Surgery) Nicholas Lose, MD as Consulting Physician (Hematology and Oncology) Eppie Gibson, MD as Attending Physician (Radiation Oncology) Nunzio Cobbs, MD as Consulting Physician (Obstetrics and Gynecology)  DIAGNOSIS:    ICD-10-CM   1. Malignant neoplasm of lower-inner quadrant of left breast in female, estrogen receptor positive ([place])  C50.312    Z17.0     SUMMARY OF ONCOLOGIC HISTORY: Oncology History  Malignant neoplasm of lower-inner quadrant of left breast in female, estrogen receptor positive ([place])  08/18/2016 Initial Diagnosis   Left breast biopsy 8:00 retroareolar position: IDC, high-grade with DCIS high-grade, ER 90%, PR 95%, Ki-67 20%, HER-2 positive ratio 3.4; screening detected left breast distortion LIQ 8 8:00 retroareolar 0.8 cm, axilla negative, T1b N0 stage IA clinical stage   09/22/2016 Surgery   Left lumpectomy: IDC grade 3, 1.3 cm, DCIS high-grade, margins clear, 0/1 lymph node negative, T1CN 0 stage IA, ER 90%, PR 95%, Ki-67 20%, HER-2 positive ratio 3.4   10/19/2016 - 01/04/2017 Chemotherapy   Taxol Herceptin weekly 12 followed by Herceptin maintenance every 3 weeks for 1 year    01/27/2017 - 03/16/2017 Radiation Therapy   Adjuvant radiation therapy   05/10/2017 -  Anti-estrogen oral therapy   Anastrozole 1 mg p.o. daily     CHIEF COMPLIANT: Follow-up of left breast cancer on anastrozole therapy  INTERVAL HISTORY: Sandra Wood is a 64 y.o. with above-mentioned history of left breast cancer treated with lumpectomy, adjuvant chemotherapy, radiation, and who is currently on anti-estrogen therapy with anastrozole. Mammogram on 02/02/20 showed no evidence of malignancy bilaterally. She presents to the clinic today for follow-up.    ALLERGIES:  is allergic to no known allergies.  MEDICATIONS:  Current Outpatient  Medications  Medication Sig Dispense Refill  . anastrozole (ARIMIDEX) 1 MG tablet Take 1 tablet (1 mg total) by mouth daily. 90 tablet 3  . blood glucose meter kit and supplies KIT Dispense based on patient and insurance preference. Use up to four times daily as directed. (FOR ICD-9 250.00, 250.01). Check blood sugar BID. 1 each 0  . Continuous Blood Gluc Receiver (FREESTYLE LIBRE 14 DAY READER) DEVI 1 Act by Does not apply route daily. 1 Device 11  . Continuous Blood Gluc Sensor (FREESTYLE LIBRE 14 DAY SENSOR) MISC USE 1 SENSOR EVERY 14 DAYS 2 each 11  . cyanocobalamin 100 MCG tablet Take 100 mcg by mouth daily.    . Cyanocobalamin 1000 MCG/ML KIT Inject as directed.    Marland Kitchen glucose blood test strip Use as instructed 100 each 12  . insulin degludec (TRESIBA FLEXTOUCH) 200 UNIT/ML FlexTouch Pen Inject 34 Units into the skin daily. 15 mL 11  . Insulin Pen Needle (BD PEN NEEDLE NANO U/F) 32G X 4 MM MISC USE TO INJECT INSULIN DAILY. 100 each 1  . losartan (COZAAR) 50 MG tablet Take 1 tablet (50 mg total) by mouth daily. 90 tablet 1  . metFORMIN (GLUCOPHAGE) 500 MG tablet Take 1 tablet (500 mg total) by mouth daily with supper. 90 tablet 3  . nystatin (MYCOSTATIN/NYSTOP) powder Apply topically 3 (three) times daily. Apply to affected area for up to 7 days 45 g 1  . nystatin-triamcinolone (MYCOLOG II) cream Apply 1 application topically 2 (two) times daily. Apply to affected area BID for up to 7 days. 60 g 0  . rosuvastatin (CRESTOR) 20 MG tablet Take 1 tablet (  20 mg total) by mouth daily. 90 tablet 1  . Semaglutide (RYBELSUS) 7 MG TABS Take 1 tablet by mouth daily. 90 tablet 1   No current facility-administered medications for this visit.    PHYSICAL EXAMINATION: ECOG PERFORMANCE STATUS: 1 - Symptomatic but completely ambulatory  Vitals:   02/09/20 1038  BP: 139/70  Pulse: 80  Resp: 18  Temp: 98.3 F (36.8 C)  SpO2: (!) 70%   Filed Weights   02/09/20 1038  Weight: (!) 305 lb 11.2 oz  (138.7 kg)       LABORATORY DATA:  I have reviewed the data as listed CMP Latest Ref Rng & Units 11/20/2019 05/22/2019 09/13/2018  Glucose 70 - 99 mg/dL 124(H) 136(H) 385(H)  BUN 6 - 23 mg/dL '13 12 10  '$ Creatinine 0.40 - 1.20 mg/dL 0.74 0.62 0.80  Sodium 135 - 145 mEq/L 141 137 137  Potassium 3.5 - 5.1 mEq/L 4.0 4.5 4.2  Chloride 96 - 112 mEq/L 104 102 102  CO2 19 - 32 mEq/L '26 27 25  '$ Calcium 8.4 - 10.5 mg/dL 9.7 9.8 9.6  Total Protein 6.0 - 8.3 g/dL - - 8.2  Total Bilirubin 0.2 - 1.2 mg/dL - - 0.5  Alkaline Phos 39 - 117 U/L - - 160(H)  AST 0 - 37 U/L - - 14  ALT 0 - 35 U/L - - 17    Lab Results  Component Value Date   WBC 11.9 (H) 11/20/2019   HGB 14.0 11/20/2019   HCT 43.0 11/20/2019   MCV 81.1 11/20/2019   PLT 374.0 11/20/2019   NEUTROABS 7.5 11/20/2019    ASSESSMENT & PLAN:  Malignant neoplasm of lower-inner quadrant of left breast in female, estrogen receptor positive (HCC) Left breast biopsy02/20/2018:8:00 retroareolar position: IDC, high-grade with DCIS high-grade, ER 90%, PR 95%, Ki-67 20%, HER-2 positive ratio 3.4; screening detected left breast distortion LIQ 8 8:00 retroareolar 0.8 cm, axilla negative, T1b N0 stage IA clinical stage Left lumpectomy 09/22/2016: IDC grade 3, 1.3 cm, DCIS high-grade, margins clear, 0/1 lymph node negative, T1 CN 0 stage IA, ER 90%, PR 95%, Ki-67 20%, HER-2 positive ratio 3.4  Treatment summary: 1. Adjuvant chemotherapy and Herceptin with weekly Taxol and Herceptin 12 completed 01/04/2017 followed by Herceptin maintenance for 1 year completed 10/04/2017 3.Adjuvant radiation8/06/2016-03/16/2017 4.Adjuvant antiestrogen therapy with letrozole 2.5 mg daily 5 yearsstarted 05/10/2017 ----------------------------------------------------------------------------- Diabetes:No change in the current plan  Anastrozoletoxicities: Mild hot flashes. She has chronic osteoarthritis related to weight.  Breast cancer  surveillance: 1.Mammogram and ultrasound 02/02/2020: Benign, breast density category B 2.breast exam patient had breast exams done by her gynecologist and her primary care physician.  Therefore we did not do an exam today.  Return to clinic in 1 year for follow-up. I sent a prescription refill for anastrozole.     No orders of the defined types were placed in this encounter.  The patient has a good understanding of the overall plan. she agrees with it. she will call with any problems that may develop before the next visit here.  Total time spent: 20 mins including face to face time and time spent for planning, charting and coordination of care  Nicholas Lose, MD 02/09/2020  I, Cloyde Reams Dorshimer, am acting as scribe for Dr. Nicholas Lose.  I have reviewed the above documentation for accuracy and completeness, and I agree with the above.

## 2020-02-12 ENCOUNTER — Other Ambulatory Visit: Payer: Self-pay

## 2020-02-12 ENCOUNTER — Ambulatory Visit (INDEPENDENT_AMBULATORY_CARE_PROVIDER_SITE_OTHER): Payer: Medicare Other | Admitting: Internal Medicine

## 2020-02-12 ENCOUNTER — Encounter: Payer: Self-pay | Admitting: Internal Medicine

## 2020-02-12 VITALS — BP 122/68 | HR 90 | Ht 61.5 in | Wt 305.4 lb

## 2020-02-12 DIAGNOSIS — E118 Type 2 diabetes mellitus with unspecified complications: Secondary | ICD-10-CM

## 2020-02-12 DIAGNOSIS — E1165 Type 2 diabetes mellitus with hyperglycemia: Secondary | ICD-10-CM

## 2020-02-12 DIAGNOSIS — E1142 Type 2 diabetes mellitus with diabetic polyneuropathy: Secondary | ICD-10-CM | POA: Diagnosis not present

## 2020-02-12 DIAGNOSIS — IMO0002 Reserved for concepts with insufficient information to code with codable children: Secondary | ICD-10-CM

## 2020-02-12 LAB — POCT GLYCOSYLATED HEMOGLOBIN (HGB A1C): Hemoglobin A1C: 7.9 % — AB (ref 4.0–5.6)

## 2020-02-12 NOTE — Progress Notes (Signed)
Name: Sandra Wood  Age/ Sex: 64 y.o., female   MRN/ DOB: [id number], 1956/05/31     PCP: Janith Lima, MD   Reason for Endocrinology Evaluation: Type 2 Diabetes Mellitus  Initial Endocrine Consultative Visit: 11/07/2018    PATIENT IDENTIFIER: Sandra Wood is a 64 y.o. female with a past medical history of HTN, T2DM, Asthma, Hx of Breast Ca (2018). The patient has followed with Endocrinology clinic since 11/07/2018 for consultative assistance with management of her diabetes.  DIABETIC HISTORY:  [name] was diagnosed with T2DM in 2013. Pt was on diet control until 08/2018 when she was started on Metformin, Rybelsus and Insulin. Her hemoglobin A1c has ranged from 6.6% in 2013, peaking at 12.5% in 2020  On her initial visit to our clinic her A1c was 8.8 % . She was on Rybelsus, tresiba and Metformin  SUBJECTIVE:   During the last visit (10/09/2019): A1c 7.2% . Continued   Metformin, tresiba and Rybelsus   Today (02/12/2020): Sandra Wood is here for a 4 months follow up on her diabetes management.  She checks her blood sugars multiple  times daily through the CGM.  The patient has  had hypoglycemic episodes since the last clinic visit, which typically occur multiple times daily . The patient is not symptomatic with these episodes.  Otherwise, the patient has not required any recent emergency interventions for hypoglycemia and has not had recent hospitalizations secondary to hyper or hypoglycemic episodes.    ROS: As per HPI and as detailed below: Review of Systems  Gastrointestinal: Negative for diarrhea and nausea.  Genitourinary: Negative for frequency.  Endo/Heme/Allergies: Negative for polydipsia.      HOME DIABETES REGIMEN:  Tresiba 34 units daily  Metformin 500 mg daily  Rybelsus 7 mg daily    CONTINUOUS GLUCOSE MONITORING RECORD INTERPRETATION    Dates of Recording: 8/3-8/16/2021  Sensor description:Freestyle   Results statistics:   CGM use % of  time 29  Average and SD 151/19.1  Time in range    83  %  % Time Above 180 17  % Time above 250 0  % Time Below target 0    Glycemic patterns summary: glucose are optimal overnight , but has postprandial hyperglycemia through the day   Hyperglycemic episodes - post-prandial   Hypoglycemic episodes occurred : N/A  Overnight periods: stable      HISTORY:  Past Medical History:  Past Medical History:  Diagnosis Date  . Abnormal Pap smear of cervix 2009  . Allergic rhinitis, cause unspecified   . Arthritis   . Arthritis of ankle BILATERAL / HX FX'S  . Asthma, mild persistent   . Breast cancer ([place])   . Dyspnea    with much activity  . Fibroids 2008  . GERD (gastroesophageal reflux disease)    not current  . History of radiation therapy 01/27/17-03/16/17   left breast 1.8 Gy in 28 fractions, left breast boost 2 Gy in 6 fractions  . HSV-2 (herpes simplex virus 2) infection    at age 44  . Malignant neoplasm of lower-inner quadrant of left breast in female, estrogen receptor positive ([place]) 08/20/2016  . Obesity, morbid ([place])   . Personal history of chemotherapy   . Personal history of radiation therapy   . Pneumonia    2013, 2017  . Tear of medial meniscus of knee LEFT  . Type II or unspecified type diabetes mellitus without mention of complication, uncontrolled    Type II- has  never been on medication   Past Surgical History:  Past Surgical History:  Procedure Laterality Date  . BREAST BIOPSY    . BREAST LUMPECTOMY Left 09/22/2016  . BREAST LUMPECTOMY WITH RADIOACTIVE SEED AND SENTINEL LYMPH NODE BIOPSY Left 09/22/2016   Procedure: BREAST LUMPECTOMY WITH RADIOACTIVE SEED AND LEFT AXILLARY SENTINEL LYMPH NODE BIOPSY;  Surgeon: Alphonsa Overall, MD;  Location: [facility];  Service: General;  Laterality: Left;  . CHONDROPLASTY  08/19/2011   Procedure: CHONDROPLASTY;  Surgeon: Magnus Sinning, MD;  Location: [facility];  Service: Orthopedics;;  shaving of medial  femeral chondral  . DILATION AND CURETTAGE OF UTERUS    . HYSTEROSCOPY WITH D & C  2008   benign endometrial polyp - Dr. Maryelizabeth Rowan  . KNEE ARTHROSCOPY W/ MENISCECTOMY Bilateral 07/07/2010   1 year apart  . MENISECTOMY  08/19/2011   Procedure: MENISECTOMY;  Surgeon: Magnus Sinning, MD;  Location: [facility];  Service: Orthopedics;;  partial lateral menisectomy  . PORT-A-CATH REMOVAL N/A 12/21/2017   Procedure: REMOVAL PORT-A-CATH;  Surgeon: Alphonsa Overall, MD;  Location: [place];  Service: General;  Laterality: N/A;  . PORTACATH PLACEMENT N/A 09/22/2016   Procedure: INSERTION PORT-A-CATH;  Surgeon: Alphonsa Overall, MD;  Location: [place];  Service: General;  Laterality: N/A;  . TONSILLECTOMY    . TUBAL LIGATION  AGE 73    Social History:  reports that she quit smoking about 33 years ago. Her smoking use included cigarettes. She quit after 5.00 years of use. She has never used smokeless tobacco. She reports previous alcohol use. She reports that she does not use drugs. Family History:  Family History  Problem Relation Age of Onset  . Diabetes Mother   . Heart disease Father   . Cancer Brother 59       prostate  . Breast cancer Maternal Grandmother 88     HOME MEDICATIONS: Allergies as of 02/12/2020      Reactions   No Known Allergies       Medication List       Accurate as of February 12, 2020  1:11 PM. If you have any questions, ask your nurse or doctor.        anastrozole 1 MG tablet Commonly known as: ARIMIDEX Take 1 tablet (1 mg total) by mouth daily.   BD Pen Needle Nano U/F 32G X 4 MM Misc Generic drug: Insulin Pen Needle USE TO INJECT INSULIN DAILY.   blood glucose meter kit and supplies Kit Dispense based on patient and insurance preference. Use up to four times daily as directed. (FOR ICD-9 250.00, 250.01). Check blood sugar BID.   Cyanocobalamin 1000 MCG/ML Kit Inject as directed.   FreeStyle Libre 14 Day Reader Kerrin Mo 1 Act by Does not apply route  daily.   FreeStyle Libre 14 Day Sensor Misc USE 1 SENSOR EVERY 14 DAYS   glucose blood test strip Use as instructed   losartan 50 MG tablet Commonly known as: COZAAR Take 1 tablet (50 mg total) by mouth daily.   metFORMIN 500 MG tablet Commonly known as: GLUCOPHAGE Take 1 tablet (500 mg total) by mouth daily with supper.   nystatin powder Commonly known as: MYCOSTATIN/NYSTOP Apply topically 3 (three) times daily. Apply to affected area for up to 7 days   nystatin-triamcinolone cream Commonly known as: MYCOLOG II Apply 1 application topically 2 (two) times daily. Apply to affected area BID for up to 7 days.   rosuvastatin 20 MG tablet  Commonly known as: CRESTOR Take 1 tablet (20 mg total) by mouth daily.   Rybelsus 7 MG Tabs Generic drug: Semaglutide Take 1 tablet by mouth daily.   Tyler Aas FlexTouch 200 UNIT/ML FlexTouch Pen Generic drug: insulin degludec Inject 34 Units into the skin daily.        OBJECTIVE:   Vital Signs: BP 122/68 (BP Location: Right Arm, Patient Position: Sitting, Cuff Size: Large)   Pulse 90   Ht 5' 1.5" (1.562 m)   Wt (!) 305 lb 6.4 oz (138.5 kg)   LMP 06/30/2003   SpO2 92%   BMI 56.77 kg/m   Wt Readings from Last 3 Encounters:  02/12/20 (!) 305 lb 6.4 oz (138.5 kg)  02/09/20 (!) 305 lb 11.2 oz (138.7 kg)  02/06/20 (!) 303 lb 12.8 oz (137.8 kg)     Exam: General: Pt appears well and is in NAD  Lungs: Clear with good BS bilat with no rales, rhonchi, or wheezes  Heart: RRR with normal S1 and S2 and no gallops; no murmurs; no rub  Extremities: No pretibial edema.   Neuro: MS is good with appropriate affect, pt is alert and Ox3       DM foot exam: 10/09/2019    The skin of the feet is intact without sores or ulcerations.Plantar callous formation noted bilaterally The pedal pulses are 2+ on right and 2+ on left. The sensation is intact to a screening 5.07, 10 gram monofilament bilaterally       DATA REVIEWED:  Lab Results   Component Value Date   HGBA1C 7.9 (A) 02/12/2020   HGBA1C 7.2 (A) 10/09/2019   HGBA1C 7.0 (H) 05/22/2019   Lab Results  Component Value Date   MICROALBUR 2.1 (H) 09/13/2018   LDLCALC 62 05/22/2019   CREATININE 0.74 11/20/2019   Lab Results  Component Value Date   MICRALBCREAT 2.3 09/13/2018     Lab Results  Component Value Date   CHOL 135 05/22/2019   HDL 44.60 05/22/2019   LDLCALC 62 05/22/2019   LDLDIRECT 177.0 09/13/2018   TRIG 143.0 05/22/2019   CHOLHDL 3 05/22/2019         ASSESSMENT / PLAN / RECOMMENDATIONS:   1) Type 2 Diabetes Mellitus, Sub- Optimally controlled, With Neuropathic complications - Most recent A1c of 7.9 %. Goal A1c < 7.0 %.     - Pt continues with hyperglycemia, due to dietary indiscretions. Pt declines increasing Rybelsus at this time, and would like to work on lifestyle changes . I have discussed with her that her A1c has been gradually trending up over the past months and management changes are recommended.  - Intolerant to higher doses of Metformin      MEDICATIONS: - Continue  Tresiba at 34 Units daily  - Continue Metformin 500 mg with Supper  - Continue Rybelsus 7 mg daily    EDUCATION / INSTRUCTIONS:  BG monitoring instructions: Patient is instructed to check her blood sugars 2 times a day, fasting and bedtime.  Call [place] [facility] if: BG persistently < 70 or > 300. . I reviewed the Rule of 15 for the treatment of hypoglycemia in detail with the patient. Literature supplied.    F/U in 4 months    Signed electronically by: Mack Guise, MD  [facility] Endocrinology  [facility] [address] [place], [place] [address] Phone: [phone] FAX: [phone]   CC: Janith Lima, [place] [address] Phone: [phone]  Fax: [phone]  Return to Endocrinology clinic as below: Future Appointments  Date Time Provider [place]  02/19/2020  10:40 AM [facility] NURSE [facility] None  06/10/2020  8:50 AM Naquisha Whitehair, Melanie Crazier, MD [facility] None  02/07/2021 11:00 AM Nicholas Lose, MD [facility] None  02/10/2021  3:00 PM Amundson Raliegh Ip, MD [place] None

## 2020-02-12 NOTE — Patient Instructions (Signed)
-   Continue  Tresiba  34 Units daily  - Take Metformin  500 mg with Supper  - Continue Rybelsus 7 mg daily     HOW TO TREAT LOW BLOOD SUGARS (Blood sugar LESS THAN 70 MG/DL)  Please follow the RULE OF 15 for the treatment of hypoglycemia treatment (when your (blood sugars are less than 70 mg/dL)    STEP 1: Take 15 grams of carbohydrates when your blood sugar is low, which includes:   3-4 GLUCOSE TABS  OR  3-4 OZ OF JUICE OR REGULAR SODA OR  ONE TUBE OF GLUCOSE GEL     STEP 2: RECHECK blood sugar in 15 MINUTES STEP 3: If your blood sugar is still low at the 15 minute recheck --> then, go back to STEP 1 and treat AGAIN with another 15 grams of carbohydrates.

## 2020-02-19 ENCOUNTER — Other Ambulatory Visit: Payer: Self-pay

## 2020-02-19 ENCOUNTER — Encounter: Payer: Self-pay | Admitting: Internal Medicine

## 2020-02-19 ENCOUNTER — Ambulatory Visit (INDEPENDENT_AMBULATORY_CARE_PROVIDER_SITE_OTHER): Payer: Medicare Other | Admitting: Internal Medicine

## 2020-02-19 DIAGNOSIS — E538 Deficiency of other specified B group vitamins: Secondary | ICD-10-CM

## 2020-02-19 DIAGNOSIS — G63 Polyneuropathy in diseases classified elsewhere: Secondary | ICD-10-CM | POA: Diagnosis not present

## 2020-02-19 MED ORDER — CYANOCOBALAMIN 1000 MCG/ML IJ SOLN
1000.0000 ug | INTRAMUSCULAR | Status: AC
  Administered 2020-05-22 – 2021-02-03 (×7): 1000 ug via INTRAMUSCULAR

## 2020-02-19 MED ORDER — CYANOCOBALAMIN 1000 MCG/ML IJ SOLN
1000.0000 ug | Freq: Once | INTRAMUSCULAR | Status: AC
  Administered 2020-02-19: 1000 ug via INTRAMUSCULAR

## 2020-02-19 NOTE — Progress Notes (Signed)
Subjective:  Patient ID: Sandra Wood, female    DOB: February 26, 1956  Age: 64 y.o. MRN: [id number]  CC: No chief complaint on file.   HPI ATLEE VILLERS presents for error  Outpatient Medications Prior to Visit  Medication Sig Dispense Refill  . anastrozole (ARIMIDEX) 1 MG tablet Take 1 tablet (1 mg total) by mouth daily. 90 tablet 3  . blood glucose meter kit and supplies KIT Dispense based on patient and insurance preference. Use up to four times daily as directed. (FOR ICD-9 250.00, 250.01). Check blood sugar BID. 1 each 0  . Continuous Blood Gluc Receiver (FREESTYLE LIBRE 14 DAY READER) DEVI 1 Act by Does not apply route daily. 1 Device 11  . Continuous Blood Gluc Sensor (FREESTYLE LIBRE 14 DAY SENSOR) MISC USE 1 SENSOR EVERY 14 DAYS 2 each 11  . Cyanocobalamin 1000 MCG/ML KIT Inject as directed.    Marland Kitchen glucose blood test strip Use as instructed 100 each 12  . insulin degludec (TRESIBA FLEXTOUCH) 200 UNIT/ML FlexTouch Pen Inject 34 Units into the skin daily. 15 mL 11  . Insulin Pen Needle (BD PEN NEEDLE NANO U/F) 32G X 4 MM MISC USE TO INJECT INSULIN DAILY. 100 each 1  . losartan (COZAAR) 50 MG tablet Take 1 tablet (50 mg total) by mouth daily. 90 tablet 1  . metFORMIN (GLUCOPHAGE) 500 MG tablet Take 1 tablet (500 mg total) by mouth daily with supper. 90 tablet 3  . nystatin (MYCOSTATIN/NYSTOP) powder Apply topically 3 (three) times daily. Apply to affected area for up to 7 days 45 g 1  . nystatin-triamcinolone (MYCOLOG II) cream Apply 1 application topically 2 (two) times daily. Apply to affected area BID for up to 7 days. 60 g 0  . rosuvastatin (CRESTOR) 20 MG tablet Take 1 tablet (20 mg total) by mouth daily. 90 tablet 1  . Semaglutide (RYBELSUS) 7 MG TABS Take 1 tablet by mouth daily. 90 tablet 1   No facility-administered medications prior to visit.    ROS Review of Systems  Objective:  LMP 06/30/2003   BP Readings from Last 3 Encounters:  02/12/20 122/68  02/09/20  139/70  02/06/20 140/78    Wt Readings from Last 3 Encounters:  02/12/20 (!) 305 lb 6.4 oz (138.5 kg)  02/09/20 (!) 305 lb 11.2 oz (138.7 kg)  02/06/20 (!) 303 lb 12.8 oz (137.8 kg)    Physical Exam  Lab Results  Component Value Date   WBC 11.9 (H) 11/20/2019   HGB 14.0 11/20/2019   HCT 43.0 11/20/2019   PLT 374.0 11/20/2019   GLUCOSE 124 (H) 11/20/2019   CHOL 135 05/22/2019   TRIG 143.0 05/22/2019   HDL 44.60 05/22/2019   LDLDIRECT 177.0 09/13/2018   LDLCALC 62 05/22/2019   ALT 17 09/13/2018   AST 14 09/13/2018   NA 141 11/20/2019   K 4.0 11/20/2019   CL 104 11/20/2019   CREATININE 0.74 11/20/2019   BUN 13 11/20/2019   CO2 26 11/20/2019   TSH 3.96 11/20/2019   INR 1.1 RATIO 07/09/2006   HGBA1C 7.9 (A) 02/12/2020   MICROALBUR 2.1 (H) 09/13/2018    No results found.  Assessment & Plan:   Diagnoses and all orders for this visit:  Vitamin B12 deficiency neuropathy (HCC) -     cyanocobalamin ((VITAMIN B-12)) injection 1,000 mcg   I am having Nychelle L. Geraci [employer]" maintain her blood glucose meter kit and supplies, glucose blood, nystatin-triamcinolone, FreeStyle Libre 14 Day Reader, Cyanocobalamin, nystatin,  FreeStyle Libre 14 Day Sensor, BD Pen Needle Nano U/F, metFORMIN, Rybelsus, Tresiba FlexTouch, losartan, rosuvastatin, and anastrozole. We will continue to administer cyanocobalamin.  Meds ordered this encounter  Medications  . cyanocobalamin ((VITAMIN B-12)) injection 1,000 mcg     Follow-up: No follow-ups on file.  Scarlette Calico, MDI have reviewed and agree

## 2020-04-01 ENCOUNTER — Other Ambulatory Visit: Payer: Self-pay | Admitting: Internal Medicine

## 2020-04-01 DIAGNOSIS — IMO0002 Reserved for concepts with insufficient information to code with codable children: Secondary | ICD-10-CM

## 2020-04-03 ENCOUNTER — Encounter: Payer: Self-pay | Admitting: Internal Medicine

## 2020-04-24 ENCOUNTER — Ambulatory Visit (INDEPENDENT_AMBULATORY_CARE_PROVIDER_SITE_OTHER): Payer: Medicare Other

## 2020-04-24 ENCOUNTER — Other Ambulatory Visit: Payer: Self-pay

## 2020-04-24 DIAGNOSIS — Z23 Encounter for immunization: Secondary | ICD-10-CM

## 2020-04-24 DIAGNOSIS — E538 Deficiency of other specified B group vitamins: Secondary | ICD-10-CM

## 2020-04-24 DIAGNOSIS — G63 Polyneuropathy in diseases classified elsewhere: Secondary | ICD-10-CM

## 2020-04-24 MED ORDER — CYANOCOBALAMIN 1000 MCG/ML IJ SOLN
1000.0000 ug | Freq: Once | INTRAMUSCULAR | Status: AC
  Administered 2020-04-24: 1000 ug via INTRAMUSCULAR

## 2020-04-24 NOTE — Progress Notes (Signed)
Pt was given vitamin B12 and High Dose Flu vaccine w/o complications.

## 2020-05-07 ENCOUNTER — Telehealth: Payer: Self-pay

## 2020-05-07 NOTE — Telephone Encounter (Signed)
LVM reminding pt to complete cologuard kit or call [employer] for another.  Will also send MyChart message.

## 2020-05-22 ENCOUNTER — Ambulatory Visit (INDEPENDENT_AMBULATORY_CARE_PROVIDER_SITE_OTHER): Payer: Medicare Other

## 2020-05-22 ENCOUNTER — Other Ambulatory Visit: Payer: Self-pay

## 2020-05-22 DIAGNOSIS — E538 Deficiency of other specified B group vitamins: Secondary | ICD-10-CM | POA: Diagnosis not present

## 2020-05-22 DIAGNOSIS — G63 Polyneuropathy in diseases classified elsewhere: Secondary | ICD-10-CM

## 2020-05-22 NOTE — Progress Notes (Signed)
B12 given and tolerated well °

## 2020-05-27 ENCOUNTER — Telehealth: Payer: Self-pay | Admitting: Internal Medicine

## 2020-05-27 ENCOUNTER — Other Ambulatory Visit: Payer: Self-pay | Admitting: Internal Medicine

## 2020-05-27 DIAGNOSIS — E1142 Type 2 diabetes mellitus with diabetic polyneuropathy: Secondary | ICD-10-CM

## 2020-05-27 DIAGNOSIS — IMO0002 Reserved for concepts with insufficient information to code with codable children: Secondary | ICD-10-CM

## 2020-05-27 NOTE — Telephone Encounter (Signed)
Pt uses [facility] on [address]. Pt requests 90 days supply Basaglar sent. Pt request stop taking [place] & begin taking BASAGLAR. Pt states no more coupons for tresiba & it is expensive. Pt has coupons for [employer]. Please advise [phone] pt also uses MyChart.

## 2020-05-28 MED ORDER — BASAGLAR KWIKPEN 100 UNIT/ML ~~LOC~~ SOPN: 34.0000 [IU] | PEN_INJECTOR | Freq: Every day | SUBCUTANEOUS | 4 refills | Status: DC

## 2020-05-28 NOTE — Telephone Encounter (Signed)
Message left for patient. Also sent a message on MyChart

## 2020-05-28 NOTE — Telephone Encounter (Signed)
Please advise. Last seen on 02/12/2020. Next appointment on 06/10/2020

## 2020-06-10 ENCOUNTER — Ambulatory Visit (INDEPENDENT_AMBULATORY_CARE_PROVIDER_SITE_OTHER): Payer: Medicare Other | Admitting: Internal Medicine

## 2020-06-10 ENCOUNTER — Other Ambulatory Visit: Payer: Self-pay

## 2020-06-10 ENCOUNTER — Encounter: Payer: Self-pay | Admitting: Internal Medicine

## 2020-06-10 VITALS — BP 130/82 | HR 70 | Ht 65.0 in | Wt 303.0 lb

## 2020-06-10 DIAGNOSIS — E1165 Type 2 diabetes mellitus with hyperglycemia: Secondary | ICD-10-CM | POA: Insufficient documentation

## 2020-06-10 DIAGNOSIS — E1142 Type 2 diabetes mellitus with diabetic polyneuropathy: Secondary | ICD-10-CM | POA: Diagnosis not present

## 2020-06-10 DIAGNOSIS — Z794 Long term (current) use of insulin: Secondary | ICD-10-CM

## 2020-06-10 LAB — POCT GLYCOSYLATED HEMOGLOBIN (HGB A1C): Hemoglobin A1C: 7.8 % — AB (ref 4.0–5.6)

## 2020-06-10 LAB — GLUCOSE, POCT (MANUAL RESULT ENTRY): POC Glucose: 163 mg/dl — AB (ref 70–99)

## 2020-06-10 MED ORDER — ONETOUCH VERIO VI STRP: ORAL_STRIP | 12 refills | Status: AC

## 2020-06-10 MED ORDER — RYBELSUS 14 MG PO TABS: 1.0000 | ORAL_TABLET | Freq: Every day | ORAL | 3 refills | Status: DC

## 2020-06-10 NOTE — Patient Instructions (Addendum)
-   Continue  Tresiba  34 Units daily  - Continue Metformin  500 mg with Supper  - Increase Rybelsus 14 mg, 1 tablet daily      HOW TO TREAT LOW BLOOD SUGARS (Blood sugar LESS THAN 70 MG/DL)  Please follow the RULE OF 15 for the treatment of hypoglycemia treatment (when your (blood sugars are less than 70 mg/dL)    STEP 1: Take 15 grams of carbohydrates when your blood sugar is low, which includes:   3-4 GLUCOSE TABS  OR  3-4 OZ OF JUICE OR REGULAR SODA OR  ONE TUBE OF GLUCOSE GEL     STEP 2: RECHECK blood sugar in 15 MINUTES STEP 3: If your blood sugar is still low at the 15 minute recheck --> then, go back to STEP 1 and treat AGAIN with another 15 grams of carbohydrates.

## 2020-06-10 NOTE — Progress Notes (Signed)
Name: Sandra Wood  Age/ Sex: 64 y.o., female   MRN/ DOB: [id number], 12-19-1955     PCP: Janith Lima, MD   Reason for Endocrinology Evaluation: Type 2 Diabetes Mellitus  Initial Endocrine Consultative Visit: 11/07/2018    PATIENT IDENTIFIER: Sandra Wood is a 64 y.o. female with a past medical history of HTN, T2DM, Asthma, Hx of Breast Ca (2018). The patient has followed with Endocrinology clinic since 11/07/2018 for consultative assistance with management of her diabetes.  DIABETIC HISTORY:  Sandra Wood was diagnosed with T2DM in 2013. Pt was on diet control until 08/2018 when she was started on Metformin, Rybelsus and Insulin. Her hemoglobin A1c has ranged from 6.6% in 2013, peaking at 12.5% in 2020  On her initial visit to our clinic her A1c was 8.8 % . She was on Rybelsus, tresiba and Metformin  SUBJECTIVE:   During the last visit (02/12/2020): A1c 7.9% . Continued   Metformin, tresiba and Rybelsus , she declined increasing the Rybelsus dose     Today (06/10/2020): Sandra Wood is here for a 4 months follow up on her diabetes management.  She has not been using the CGM due to cost issues as she has been paying for daughters bills. The patient has not had hypoglycemic episodes since the last clinic visit, which typically occur multiple times daily .    She is lactose intolerant and has occasional diarrhea due to that     HOME DIABETES REGIMEN:  Basaglar  34 units daily  Metformin 500 mg with supper Rybelsus 7 mg daily       CONTINUOUS GLUCOSE MONITORING RECORD INTERPRETATION  : Has not been using     HISTORY:  Past Medical History:  Past Medical History:  Diagnosis Date  . Abnormal Pap smear of cervix 2009  . Allergic rhinitis, cause unspecified   . Arthritis   . Arthritis of ankle BILATERAL / HX FX'S  . Asthma, mild persistent   . Breast cancer ([place])   . Dyspnea    with much activity  . Fibroids 2008  . GERD (gastroesophageal reflux  disease)    not current  . History of radiation therapy 01/27/17-03/16/17   left breast 1.8 Gy in 28 fractions, left breast boost 2 Gy in 6 fractions  . HSV-2 (herpes simplex virus 2) infection    at age 26  . Malignant neoplasm of lower-inner quadrant of left breast in female, estrogen receptor positive ([place]) 08/20/2016  . Obesity, morbid ([place])   . Personal history of chemotherapy   . Personal history of radiation therapy   . Pneumonia    2013, 2017  . Tear of medial meniscus of knee LEFT  . Type II or unspecified type diabetes mellitus without mention of complication, uncontrolled    Type II- has never been on medication   Past Surgical History:  Past Surgical History:  Procedure Laterality Date  . BREAST BIOPSY    . BREAST LUMPECTOMY Left 09/22/2016  . BREAST LUMPECTOMY WITH RADIOACTIVE SEED AND SENTINEL LYMPH NODE BIOPSY Left 09/22/2016   Procedure: BREAST LUMPECTOMY WITH RADIOACTIVE SEED AND LEFT AXILLARY SENTINEL LYMPH NODE BIOPSY;  Surgeon: Alphonsa Overall, MD;  Location: [place];  Service: General;  Laterality: Left;  . CHONDROPLASTY  08/19/2011   Procedure: CHONDROPLASTY;  Surgeon: Magnus Sinning, MD;  Location: [facility];  Service: Orthopedics;;  shaving of medial femeral chondral  . DILATION AND CURETTAGE OF UTERUS    . HYSTEROSCOPY WITH D &  C  2008   benign endometrial polyp - Dr. Maryelizabeth Rowan  . KNEE ARTHROSCOPY W/ MENISCECTOMY Bilateral 07/07/2010   1 year apart  . MENISECTOMY  08/19/2011   Procedure: MENISECTOMY;  Surgeon: Magnus Sinning, MD;  Location: [facility];  Service: Orthopedics;;  partial lateral menisectomy  . PORT-A-CATH REMOVAL N/A 12/21/2017   Procedure: REMOVAL PORT-A-CATH;  Surgeon: Alphonsa Overall, MD;  Location: [place];  Service: General;  Laterality: N/A;  . PORTACATH PLACEMENT N/A 09/22/2016   Procedure: INSERTION PORT-A-CATH;  Surgeon: Alphonsa Overall, MD;  Location: [place];  Service: General;  Laterality: N/A;  . TONSILLECTOMY     . TUBAL LIGATION  AGE 70    Social History:  reports that she quit smoking about 33 years ago. Her smoking use included cigarettes. She quit after 5.00 years of use. She has never used smokeless tobacco. She reports previous alcohol use. She reports that she does not use drugs. Family History:  Family History  Problem Relation Age of Onset  . Diabetes Mother   . Heart disease Father   . Cancer Brother 71       prostate  . Breast cancer Maternal Grandmother 8     HOME MEDICATIONS: Allergies as of 06/10/2020      Reactions   No Known Allergies       Medication List       Accurate as of June 10, 2020  9:19 AM. If you have any questions, ask your nurse or doctor.        anastrozole 1 MG tablet Commonly known as: ARIMIDEX Take 1 tablet (1 mg total) by mouth daily.   Basaglar KwikPen 100 UNIT/ML Inject 34 Units into the skin daily.   BD Pen Needle Nano U/F 32G X 4 MM Misc Generic drug: Insulin Pen Needle USE TO INJECT INSULIN DAILY.   blood glucose meter kit and supplies Kit Dispense based on patient and insurance preference. Use up to four times daily as directed. (FOR ICD-9 250.00, 250.01). Check blood sugar BID.   Cyanocobalamin 1000 MCG/ML Kit Inject as directed.   FreeStyle Libre 14 Day Reader Kerrin Mo 1 Act by Does not apply route daily.   FreeStyle Libre 14 Day Sensor Misc USE 1 SENSOR EVERY 14 DAYS   glucose blood test strip Use as instructed   losartan 50 MG tablet Commonly known as: COZAAR Take 1 tablet (50 mg total) by mouth daily.   metFORMIN 500 MG tablet Commonly known as: GLUCOPHAGE Take 1 tablet (500 mg total) by mouth daily with supper.   nystatin powder Commonly known as: MYCOSTATIN/NYSTOP Apply topically 3 (three) times daily. Apply to affected area for up to 7 days   nystatin-triamcinolone cream Commonly known as: MYCOLOG II Apply 1 application topically 2 (two) times daily. Apply to affected area BID for up to 7 days.    rosuvastatin 20 MG tablet Commonly known as: CRESTOR Take 1 tablet (20 mg total) by mouth daily.   Rybelsus 14 MG Tabs Generic drug: Semaglutide Take 1 tablet by mouth daily with breakfast. What changed:   medication strength  when to take this Changed by: Dorita Sciara, MD        OBJECTIVE:   Vital Signs: BP 130/82   Pulse 70   Ht 5' 5" (1.651 m)   Wt (!) 303 lb (137.4 kg)   LMP 06/30/2003   SpO2 98%   BMI 50.42 kg/m   Wt Readings from Last 3 Encounters:  06/10/20 (!) 303  lb (137.4 kg)  02/12/20 (!) 305 lb 6.4 oz (138.5 kg)  02/09/20 (!) 305 lb 11.2 oz (138.7 kg)     Exam: General: Pt appears well and is in NAD  Lungs: Clear with good BS bilat with no rales, rhonchi, or wheezes  Heart: RRR with normal S1 and S2 and no gallops; no murmurs; no rub  Extremities: No pretibial edema.   Neuro: MS is good with appropriate affect, pt is alert and Ox3       DM foot exam: 06/10/2020    The skin of the feet is intact without sores or ulcerations.Plantar callous formation noted bilaterally The pedal pulses are 2+ on right and 2+ on left. The sensation is decreased  to a screening 5.07, 10 gram monofilament bilaterally       DATA REVIEWED:  Lab Results  Component Value Date   HGBA1C 7.8 (A) 06/10/2020   HGBA1C 7.9 (A) 02/12/2020   HGBA1C 7.2 (A) 10/09/2019   Lab Results  Component Value Date   MICROALBUR 2.1 (H) 09/13/2018   LDLCALC 62 05/22/2019   CREATININE 0.74 11/20/2019   Lab Results  Component Value Date   MICRALBCREAT 2.3 09/13/2018     Lab Results  Component Value Date   CHOL 135 05/22/2019   HDL 44.60 05/22/2019   LDLCALC 62 05/22/2019   LDLDIRECT 177.0 09/13/2018   TRIG 143.0 05/22/2019   CHOLHDL 3 05/22/2019         ASSESSMENT / PLAN / RECOMMENDATIONS:   1) Type 2 Diabetes Mellitus, Sub- Optimally controlled, With Neuropathic complications - Most recent A1c of 7.8 %. Goal A1c < 7.0 %.    - A1c continues to be above goal   - We again discussed increasing Rybelsus which she agreed to  - Intolerant to higher doses of Metformin      MEDICATIONS: - Continue  Basaglar 34 Units daily  - Continue Metformin 500 mg with Supper  - Increase Rybelsus 7 mg daily    EDUCATION / INSTRUCTIONS:  BG monitoring instructions: Patient is instructed to check her blood sugars 2 times a day, fasting and bedtime.  Call [facility] [facility] if: BG persistently < 70 . I reviewed the Rule of 15 for the treatment of hypoglycemia in detail with the patient. Literature supplied.    F/U in 4 months    Signed electronically by: Mack Guise, MD  [facility] Endocrinology  [facility] [address] [place], [place] [address] Phone: [phone] FAX: [phone]   CC: Janith Lima, MD [place] [address] Phone: [phone]  Fax: [phone]  Return to Endocrinology clinic as below: Future Appointments  Date Time Provider [place]  06/24/2020 10:20 AM [facility] NURSE [facility] None  02/07/2021 11:00 AM Nicholas Lose, MD [facility] None  02/10/2021  3:00 PM Amundson Raliegh Ip, MD [place] None

## 2020-06-24 ENCOUNTER — Other Ambulatory Visit: Payer: Self-pay

## 2020-06-24 ENCOUNTER — Ambulatory Visit (INDEPENDENT_AMBULATORY_CARE_PROVIDER_SITE_OTHER): Payer: Medicare Other

## 2020-06-24 DIAGNOSIS — E538 Deficiency of other specified B group vitamins: Secondary | ICD-10-CM | POA: Diagnosis not present

## 2020-06-24 DIAGNOSIS — G63 Polyneuropathy in diseases classified elsewhere: Secondary | ICD-10-CM

## 2020-06-24 MED ORDER — CYANOCOBALAMIN 1000 MCG/ML IJ SOLN
1000.0000 ug | Freq: Once | INTRAMUSCULAR | Status: AC
  Administered 2020-06-24: 10:00:00 1000 ug via INTRAMUSCULAR

## 2020-06-24 NOTE — Progress Notes (Addendum)
Vitamin B12 was given to pt w/o any complications. Medical screening examination/treatment/procedure(s) were performed by non-physician practitioner and as supervising physician I was immediately available for consultation/collaboration.  I agree with above. Lew Dawes, MD

## 2020-07-29 ENCOUNTER — Other Ambulatory Visit: Payer: Self-pay | Admitting: Internal Medicine

## 2020-07-29 DIAGNOSIS — E1165 Type 2 diabetes mellitus with hyperglycemia: Secondary | ICD-10-CM

## 2020-07-29 DIAGNOSIS — IMO0002 Reserved for concepts with insufficient information to code with codable children: Secondary | ICD-10-CM

## 2020-07-31 ENCOUNTER — Telehealth: Payer: Self-pay

## 2020-07-31 ENCOUNTER — Other Ambulatory Visit: Payer: Self-pay

## 2020-07-31 ENCOUNTER — Ambulatory Visit (INDEPENDENT_AMBULATORY_CARE_PROVIDER_SITE_OTHER): Payer: Medicare Other

## 2020-07-31 DIAGNOSIS — E538 Deficiency of other specified B group vitamins: Secondary | ICD-10-CM

## 2020-07-31 DIAGNOSIS — G63 Polyneuropathy in diseases classified elsewhere: Secondary | ICD-10-CM

## 2020-07-31 NOTE — Telephone Encounter (Signed)
Updated monthly b12 injections order needed. Last b12 order on 05/22/19 was to d/c b12 injections Last OV: 02/19/20 Last b12 lab: 09/13/18

## 2020-07-31 NOTE — Progress Notes (Signed)
Pt here for monthly B12 injection per Dr Ronnald Ramp.  B12 1034mcg given IM right deltoid and pt tolerated injection well.  Next B12 injection scheduled for 08/28/20.

## 2020-07-31 NOTE — Telephone Encounter (Signed)
Continue B12 injections monthly

## 2020-08-27 ENCOUNTER — Other Ambulatory Visit: Payer: Self-pay

## 2020-08-28 ENCOUNTER — Other Ambulatory Visit: Payer: Self-pay

## 2020-08-28 ENCOUNTER — Ambulatory Visit (INDEPENDENT_AMBULATORY_CARE_PROVIDER_SITE_OTHER): Payer: Medicare Other

## 2020-08-28 DIAGNOSIS — E538 Deficiency of other specified B group vitamins: Secondary | ICD-10-CM

## 2020-08-28 DIAGNOSIS — G63 Polyneuropathy in diseases classified elsewhere: Secondary | ICD-10-CM

## 2020-08-28 NOTE — Progress Notes (Signed)
Pt here for monthly B12 injection per Dr Ronnald Ramp  B12 1090mcg given IM right deltoid and pt tolerated injection well.  Next B12 injection scheduled for 08/30/20.

## 2020-09-26 ENCOUNTER — Other Ambulatory Visit: Payer: Self-pay | Admitting: Internal Medicine

## 2020-09-26 DIAGNOSIS — IMO0002 Reserved for concepts with insufficient information to code with codable children: Secondary | ICD-10-CM

## 2020-09-26 DIAGNOSIS — E1165 Type 2 diabetes mellitus with hyperglycemia: Secondary | ICD-10-CM

## 2020-09-26 DIAGNOSIS — I10 Essential (primary) hypertension: Secondary | ICD-10-CM

## 2020-09-27 ENCOUNTER — Other Ambulatory Visit: Payer: Self-pay

## 2020-09-30 ENCOUNTER — Ambulatory Visit (INDEPENDENT_AMBULATORY_CARE_PROVIDER_SITE_OTHER): Payer: Medicare Other

## 2020-09-30 ENCOUNTER — Other Ambulatory Visit: Payer: Self-pay

## 2020-09-30 DIAGNOSIS — E538 Deficiency of other specified B group vitamins: Secondary | ICD-10-CM

## 2020-09-30 DIAGNOSIS — G63 Polyneuropathy in diseases classified elsewhere: Secondary | ICD-10-CM | POA: Diagnosis not present

## 2020-09-30 NOTE — Progress Notes (Signed)
Pt here for monthly B12 injection per Dr. Ronnald Ramp  B12 1044mcg given IM, given right deltoid and pt tolerated injection well.  Next B12 injection scheduled for 10/30/20

## 2020-10-09 ENCOUNTER — Ambulatory Visit: Payer: Medicare Other | Admitting: Internal Medicine

## 2020-10-30 ENCOUNTER — Ambulatory Visit (INDEPENDENT_AMBULATORY_CARE_PROVIDER_SITE_OTHER): Payer: Medicare Other

## 2020-10-30 ENCOUNTER — Telehealth: Payer: Self-pay | Admitting: Internal Medicine

## 2020-10-30 ENCOUNTER — Other Ambulatory Visit: Payer: Self-pay

## 2020-10-30 DIAGNOSIS — G63 Polyneuropathy in diseases classified elsewhere: Secondary | ICD-10-CM

## 2020-10-30 DIAGNOSIS — E538 Deficiency of other specified B group vitamins: Secondary | ICD-10-CM | POA: Diagnosis not present

## 2020-10-30 NOTE — Telephone Encounter (Signed)
Patient dropped off a renewal parking placard.   Form has been completed &Placed in providers box to review and sign.  

## 2020-10-30 NOTE — Telephone Encounter (Signed)
Form has been signed, Copy sent to scan.   LVM to inform patient it is ready to be picked up. If she wants it mailed to call me back.

## 2020-10-30 NOTE — Progress Notes (Signed)
Pt here for monthly B12 injection per Dr. Ronnald Ramp  B12 1082mcg given right deltoid IM, and pt tolerated injection well.  Next B12 injection scheduled for 12/02/20

## 2020-11-06 ENCOUNTER — Other Ambulatory Visit: Payer: Self-pay | Admitting: Internal Medicine

## 2020-11-06 DIAGNOSIS — E1142 Type 2 diabetes mellitus with diabetic polyneuropathy: Secondary | ICD-10-CM

## 2020-11-06 DIAGNOSIS — IMO0002 Reserved for concepts with insufficient information to code with codable children: Secondary | ICD-10-CM

## 2020-11-06 DIAGNOSIS — I10 Essential (primary) hypertension: Secondary | ICD-10-CM

## 2020-11-06 DIAGNOSIS — E1165 Type 2 diabetes mellitus with hyperglycemia: Secondary | ICD-10-CM

## 2020-11-15 ENCOUNTER — Other Ambulatory Visit: Payer: Self-pay | Admitting: Internal Medicine

## 2020-11-15 DIAGNOSIS — E1165 Type 2 diabetes mellitus with hyperglycemia: Secondary | ICD-10-CM

## 2020-11-15 DIAGNOSIS — I10 Essential (primary) hypertension: Secondary | ICD-10-CM

## 2020-11-15 DIAGNOSIS — IMO0002 Reserved for concepts with insufficient information to code with codable children: Secondary | ICD-10-CM

## 2020-11-15 MED ORDER — LOSARTAN POTASSIUM 50 MG PO TABS: 1.0000 | ORAL_TABLET | Freq: Every day | ORAL | 0 refills | Status: DC

## 2020-12-02 ENCOUNTER — Other Ambulatory Visit: Payer: Self-pay

## 2020-12-02 ENCOUNTER — Ambulatory Visit (INDEPENDENT_AMBULATORY_CARE_PROVIDER_SITE_OTHER): Payer: Medicare Other

## 2020-12-02 DIAGNOSIS — G63 Polyneuropathy in diseases classified elsewhere: Secondary | ICD-10-CM

## 2020-12-02 DIAGNOSIS — E538 Deficiency of other specified B group vitamins: Secondary | ICD-10-CM | POA: Diagnosis not present

## 2020-12-02 MED ORDER — CYANOCOBALAMIN 1000 MCG/ML IJ SOLN
1000.0000 ug | Freq: Once | INTRAMUSCULAR | Status: AC
  Administered 2020-12-02: 1000 ug via INTRAMUSCULAR

## 2020-12-02 NOTE — Progress Notes (Addendum)
Patient here for monthly b12 injection per Dr. Ronnald Ramp.  B12 1061mcg given in right deltoid IM, and patient tolerated injection well.  Next B12 injection scheduled for 01/01/21  I have reviewed and agree

## 2020-12-25 ENCOUNTER — Encounter: Payer: Self-pay | Admitting: Internal Medicine

## 2020-12-25 ENCOUNTER — Other Ambulatory Visit: Payer: Self-pay

## 2020-12-25 ENCOUNTER — Other Ambulatory Visit: Payer: Self-pay | Admitting: Internal Medicine

## 2020-12-25 ENCOUNTER — Ambulatory Visit (INDEPENDENT_AMBULATORY_CARE_PROVIDER_SITE_OTHER): Payer: Medicare Other | Admitting: Internal Medicine

## 2020-12-25 VITALS — BP 138/76 | HR 93 | Ht 62.0 in | Wt 297.0 lb

## 2020-12-25 DIAGNOSIS — E1165 Type 2 diabetes mellitus with hyperglycemia: Secondary | ICD-10-CM | POA: Diagnosis not present

## 2020-12-25 DIAGNOSIS — IMO0002 Reserved for concepts with insufficient information to code with codable children: Secondary | ICD-10-CM

## 2020-12-25 DIAGNOSIS — Z794 Long term (current) use of insulin: Secondary | ICD-10-CM

## 2020-12-25 DIAGNOSIS — E118 Type 2 diabetes mellitus with unspecified complications: Secondary | ICD-10-CM

## 2020-12-25 DIAGNOSIS — E785 Hyperlipidemia, unspecified: Secondary | ICD-10-CM | POA: Diagnosis not present

## 2020-12-25 DIAGNOSIS — E1142 Type 2 diabetes mellitus with diabetic polyneuropathy: Secondary | ICD-10-CM

## 2020-12-25 LAB — BASIC METABOLIC PANEL
BUN: 14 mg/dL (ref 6–23)
CO2: 27 mEq/L (ref 19–32)
Calcium: 9.8 mg/dL (ref 8.4–10.5)
Chloride: 100 mEq/L (ref 96–112)
Creatinine, Ser: 0.75 mg/dL (ref 0.40–1.20)
GFR: 83.07 mL/min (ref >60.00)
Glucose, Bld: 168 mg/dL — ABNORMAL HIGH (ref 70–99)
Potassium: 4.4 mEq/L (ref 3.5–5.1)
Sodium: 137 mEq/L (ref 135–145)

## 2020-12-25 LAB — POCT GLYCOSYLATED HEMOGLOBIN (HGB A1C): Hemoglobin A1C: 7.7 % — AB (ref 4.0–5.6)

## 2020-12-25 LAB — LIPID PANEL
Cholesterol: 130 mg/dL (ref 0–200)
HDL: 42.6 mg/dL (ref >39.00)
LDL Cholesterol: 62 mg/dL (ref 0–99)
NonHDL: 87.37
Total CHOL/HDL Ratio: 3
Triglycerides: 127 mg/dL (ref 0.0–149.0)
VLDL: 25.4 mg/dL (ref 0.0–40.0)

## 2020-12-25 LAB — MICROALBUMIN / CREATININE URINE RATIO
Creatinine,U: 164.2 mg/dL
Microalb Creat Ratio: 0.6 mg/g (ref 0.0–30.0)
Microalb, Ur: 1.1 mg/dL (ref 0.0–1.9)

## 2020-12-25 MED ORDER — ROSUVASTATIN CALCIUM 20 MG PO TABS: 20.0000 mg | ORAL_TABLET | Freq: Every day | ORAL | 3 refills | Status: DC

## 2020-12-25 MED ORDER — BD PEN NEEDLE NANO 2ND GEN 32G X 4 MM MISC: 1.0000 | Freq: Every day | 3 refills | Status: DC

## 2020-12-25 MED ORDER — RYBELSUS 14 MG PO TABS: 1.0000 | ORAL_TABLET | Freq: Every day | ORAL | 3 refills | Status: DC

## 2020-12-25 MED ORDER — BASAGLAR KWIKPEN 100 UNIT/ML ~~LOC~~ SOPN: 38.0000 [IU] | PEN_INJECTOR | Freq: Every day | SUBCUTANEOUS | 3 refills | Status: DC

## 2020-12-25 MED ORDER — BASAGLAR KWIKPEN 100 UNIT/ML ~~LOC~~ SOPN: 38.0000 [IU] | PEN_INJECTOR | Freq: Every day | SUBCUTANEOUS | 4 refills | Status: DC

## 2020-12-25 NOTE — Progress Notes (Signed)
Name: Sandra Wood  Age/ Sex: 65 y.o., female   MRN/ DOB: [id number], 1956/01/12     PCP: Janith Lima, MD   Reason for Endocrinology Evaluation: Type 2 Diabetes Mellitus  Initial Endocrine Consultative Visit: 11/07/2018    PATIENT IDENTIFIER: Sandra Wood is a 65 y.o. female with a past medical history of HTN, T2DM, Asthma, Hx of Breast Ca (2018). The patient has followed with Endocrinology clinic since 11/07/2018 for consultative assistance with management of her diabetes.  DIABETIC HISTORY:  Sandra Wood was diagnosed with T2DM in 2013. Pt was on diet control until 08/2018 when she was started on Metformin, Rybelsus and Insulin. Her hemoglobin A1c has ranged from 6.6% in 2013, peaking at 12.5% in 2020  On her initial visit to our clinic her A1c was 8.8 % . She was on Rybelsus, tresiba and Metformin  Stopped Metformin 10/2020 due to GI side effects   SUBJECTIVE:   During the last visit (06/10/2020): A1c 7.9% . Continued   Metformin, tresiba and Rybelsus , she declined increasing the Rybelsus dose     Today (12/25/2020): Sandra Wood is here for a 4 months follow up on her diabetes management.  She has not been checking glucose    She is lactose intolerant and has occasional diarrhea due to that  She stopped Metformin in 09/2020 due to stomach issues , she is not sure of that helped  Denies fever   HOME DIABETES REGIMEN:  Basaglar  34 units daily  Metformin 500 mg with supper-not taking  Rybelsus 14 mg daily       DIABETIC COMPLICATIONS: Microvascular complications:  neuropathy Denies: CKD,  Last eye exam: Completed 2021 Macrovascular complications:   Denies: CAD, PVD, CVA      HISTORY:  Past Medical History:  Past Medical History:  Diagnosis Date   Abnormal Pap smear of cervix 2009   Allergic rhinitis, cause unspecified    Arthritis    Arthritis of ankle BILATERAL / HX FX'S   Asthma, mild persistent    Breast cancer ([place])    Dyspnea     with much activity   Fibroids 2008   GERD (gastroesophageal reflux disease)    not current   History of radiation therapy 01/27/17-03/16/17   left breast 1.8 Gy in 28 fractions, left breast boost 2 Gy in 6 fractions   HSV-2 (herpes simplex virus 2) infection    at age 77   Malignant neoplasm of lower-inner quadrant of left breast in female, estrogen receptor positive ([place]) 08/20/2016   Obesity, morbid ([place])    Personal history of chemotherapy    Personal history of radiation therapy    Pneumonia    2013, 2017   Tear of medial meniscus of knee LEFT   Type II or unspecified type diabetes mellitus without mention of complication, uncontrolled    Type II- has never been on medication   Past Surgical History:  Past Surgical History:  Procedure Laterality Date   BREAST BIOPSY     BREAST LUMPECTOMY Left 09/22/2016   BREAST LUMPECTOMY WITH RADIOACTIVE SEED AND SENTINEL LYMPH NODE BIOPSY Left 09/22/2016   Procedure: BREAST LUMPECTOMY WITH RADIOACTIVE SEED AND LEFT AXILLARY SENTINEL LYMPH NODE BIOPSY;  Surgeon: Alphonsa Overall, MD;  Location: [place];  Service: General;  Laterality: Left;   CHONDROPLASTY  08/19/2011   Procedure: CHONDROPLASTY;  Surgeon: Magnus Sinning, MD;  Location: [place];  Service: Orthopedics;;  shaving of medial femeral chondral   DILATION  AND CURETTAGE OF UTERUS     HYSTEROSCOPY WITH D & C  2008   benign endometrial polyp - Dr. Maryelizabeth Rowan   KNEE ARTHROSCOPY W/ MENISCECTOMY Bilateral 07/07/2010   1 year apart   MENISECTOMY  08/19/2011   Procedure: MENISECTOMY;  Surgeon: Magnus Sinning, MD;  Location: [facility];  Service: Orthopedics;;  partial lateral menisectomy   PORT-A-CATH REMOVAL N/A 12/21/2017   Procedure: REMOVAL PORT-A-CATH;  Surgeon: Alphonsa Overall, MD;  Location: [place];  Service: General;  Laterality: N/A;   PORTACATH PLACEMENT N/A 09/22/2016   Procedure: INSERTION PORT-A-CATH;  Surgeon: Alphonsa Overall, MD;  Location: [facility];   Service: General;  Laterality: N/A;   TONSILLECTOMY     TUBAL LIGATION  AGE 10   Social History:  reports that she quit smoking about 34 years ago. Her smoking use included cigarettes. She has never used smokeless tobacco. She reports previous alcohol use. She reports that she does not use drugs. Family History:  Family History  Problem Relation Age of Onset   Diabetes Mother    Heart disease Father    Cancer Brother 29       prostate   Breast cancer Maternal Grandmother 88     HOME MEDICATIONS: Allergies as of 12/25/2020       Reactions   No Known Allergies         Medication List        Accurate as of December 25, 2020  9:27 AM. If you have any questions, ask your nurse or doctor.          anastrozole 1 MG tablet Commonly known as: ARIMIDEX Take 1 tablet (1 mg total) by mouth daily.   Basaglar KwikPen 100 UNIT/ML Inject 34 Units into the skin daily.   BD Pen Needle Nano 2nd Gen 32G X 4 MM Misc Generic drug: Insulin Pen Needle USE TO INJECT INSULIN DAILY.   blood glucose meter kit and supplies Kit Dispense based on patient and insurance preference. Use up to four times daily as directed. (FOR ICD-9 250.00, 250.01). Check blood sugar BID.   Cyanocobalamin 1000 MCG/ML Kit Inject as directed.   FreeStyle Libre 14 Day Reader Kerrin Mo 1 Act by Does not apply route daily.   FreeStyle Libre 14 Day Sensor Misc USE 1 SENSOR EVERY 14 DAYS   losartan 50 MG tablet Commonly known as: COZAAR Take 1 tablet (50 mg total) by mouth daily.   metFORMIN 500 MG tablet Commonly known as: GLUCOPHAGE Take 1 tablet (500 mg total) by mouth daily with supper.   nystatin powder Commonly known as: MYCOSTATIN/NYSTOP Apply topically 3 (three) times daily. Apply to affected area for up to 7 days   nystatin-triamcinolone cream Commonly known as: MYCOLOG II Apply 1 application topically 2 (two) times daily. Apply to affected area BID for up to 7 days.   OneTouch Verio test  strip Generic drug: glucose blood Use as instructed   rosuvastatin 20 MG tablet Commonly known as: CRESTOR Take 1 tablet (20 mg total) by mouth daily.   Rybelsus 14 MG Tabs Generic drug: Semaglutide Take 1 tablet by mouth daily with breakfast.         OBJECTIVE:   Vital Signs: BP 138/76   Pulse 93   Ht 5' 2" (1.575 m)   Wt 297 lb (134.7 kg)   LMP 06/30/2003   SpO2 94%   BMI 54.32 kg/m   Wt Readings from Last 3 Encounters:  12/25/20 297 lb (134.7 kg)  06/10/20 (!) 303 lb (137.4 kg)  02/12/20 (!) 305 lb 6.4 oz (138.5 kg)     Exam: General: Pt appears well and is in NAD  Lungs: Clear with good BS bilat with no rales, rhonchi, or wheezes  Heart: RRR with normal S1 and S2 and no gallops; no murmurs; no rub  Extremities: No pretibial edema.   Neuro: MS is good with appropriate affect, pt is alert and Ox3       DM foot exam: 06/10/2020    The skin of the feet is intact without sores or ulcerations.Plantar callous formation noted bilaterally The pedal pulses are 2+ on right and 2+ on left. The sensation is decreased  to a screening 5.07, 10 gram monofilament bilaterally       DATA REVIEWED:  Lab Results  Component Value Date   HGBA1C 7.7 (A) 12/25/2020   HGBA1C 7.8 (A) 06/10/2020   HGBA1C 7.9 (A) 02/12/2020   Results for Apfel, Sandra L "CINDY" (MRN [id number]) as of 12/25/2020 16:09  Ref. Range 12/25/2020 09:39  Sodium Latest Ref Range: 135 - 145 mEq/L 137  Potassium Latest Ref Range: 3.5 - 5.1 mEq/L 4.4  Chloride Latest Ref Range: 96 - 112 mEq/L 100  CO2 Latest Ref Range: 19 - 32 mEq/L 27  Glucose Latest Ref Range: 70 - 99 mg/dL 168 (H)  BUN Latest Ref Range: 6 - 23 mg/dL 14  Creatinine Latest Ref Range: 0.40 - 1.20 mg/dL 0.75  Calcium Latest Ref Range: 8.4 - 10.5 mg/dL 9.8  GFR Latest Ref Range: >60.00 mL/min 83.07  Total CHOL/HDL Ratio Unknown 3  Cholesterol Latest Ref Range: 0 - 200 mg/dL 130  HDL Cholesterol Latest Ref Range: >39.00 mg/dL 42.60   LDL (calc) Latest Ref Range: 0 - 99 mg/dL 62  MICROALB/CREAT RATIO Latest Ref Range: 0.0 - 30.0 mg/g 0.6  NonHDL Unknown 87.37  Triglycerides Latest Ref Range: 0.0 - 149.0 mg/dL 127.0  VLDL Latest Ref Range: 0.0 - 40.0 mg/dL 25.4  Creatinine,U Latest Units: mg/dL 164.2  Microalb, Ur Latest Ref Range: 0.0 - 1.9 mg/dL 1.1     ASSESSMENT / PLAN / RECOMMENDATIONS:   1) Type 2 Diabetes Mellitus, Sub- Optimally controlled, With Neuropathic complications - Most recent A1c of 7.7 %. Goal A1c < 7.0 %.    - A1c continues to be above goal  - She stopped metformin due to GI issues  - We discussed add-on therpay with SGLT-2 inhibitors but she opted to increase insulin instead at this time    MEDICATIONS: - Increase  Basaglar 38 Units daily  - Continue Rybelsus 14 mg daily    EDUCATION / INSTRUCTIONS: BG monitoring instructions: Patient is instructed to check her blood sugars 2 times a day, fasting and bedtime. Call [facility] [facility] if: BG persistently < 70 I reviewed the Rule of 15 for the treatment of hypoglycemia in detail with the patient. Literature supplied.   2) Dyslipidemia :  - Repeat lipid today is at goal  Medication Continue rosuvastatin 20 mg daily   F/U in 4 months    Signed electronically by: Mack Guise, MD  [facility] Endocrinology  [facility] [address] [place], [place] [address] Phone: [phone] FAX: [phone]   CC: Janith Lima, MD [place] [address] Phone: [phone]  Fax: [phone]  Return to Endocrinology clinic as below: Future Appointments  Date Time Provider [place]  12/25/2020  9:30 AM Libi Corso, Melanie Crazier, MD [facility] None  01/01/2021  9:20 AM [facility] NURSE [facility] None  02/07/2021 11:00 AM Nicholas Lose, MD [facility] None  02/10/2021  3:00 PM Amundson Raliegh Ip, MD [facility] None

## 2020-12-25 NOTE — Patient Instructions (Addendum)
-   Increase Basaglar 38 Units daily  - Continue Rybelsus 14 mg, 1 tablet daily     HOW TO TREAT LOW BLOOD SUGARS (Blood sugar LESS THAN 70 MG/DL) Please follow the RULE OF 15 for the treatment of hypoglycemia treatment (when your (blood sugars are less than 70 mg/dL)   STEP 1: Take 15 grams of carbohydrates when your blood sugar is low, which includes:  3-4 GLUCOSE TABS  OR 3-4 OZ OF JUICE OR REGULAR SODA OR ONE TUBE OF GLUCOSE GEL    STEP 2: RECHECK blood sugar in 15 MINUTES STEP 3: If your blood sugar is still low at the 15 minute recheck --> then, go back to STEP 1 and treat AGAIN with another 15 grams of carbohydrates.

## 2021-01-01 ENCOUNTER — Ambulatory Visit (INDEPENDENT_AMBULATORY_CARE_PROVIDER_SITE_OTHER): Payer: Medicare Other

## 2021-01-01 ENCOUNTER — Other Ambulatory Visit: Payer: Self-pay

## 2021-01-01 DIAGNOSIS — G63 Polyneuropathy in diseases classified elsewhere: Secondary | ICD-10-CM

## 2021-01-01 DIAGNOSIS — E538 Deficiency of other specified B group vitamins: Secondary | ICD-10-CM

## 2021-01-01 NOTE — Progress Notes (Signed)
Pt here for monthly B12 injection per Dr Ronnald Ramp.  B12 104mcg given IM right deltoid and pt tolerated injection well.  Next B12 injection scheduled for 02/03/21.

## 2021-02-03 ENCOUNTER — Ambulatory Visit (INDEPENDENT_AMBULATORY_CARE_PROVIDER_SITE_OTHER): Payer: Medicare Other

## 2021-02-03 ENCOUNTER — Other Ambulatory Visit: Payer: Self-pay

## 2021-02-03 DIAGNOSIS — E538 Deficiency of other specified B group vitamins: Secondary | ICD-10-CM

## 2021-02-03 DIAGNOSIS — G63 Polyneuropathy in diseases classified elsewhere: Secondary | ICD-10-CM | POA: Diagnosis not present

## 2021-02-03 MED ORDER — CYANOCOBALAMIN 1000 MCG/ML IJ SOLN
1000.0000 ug | Freq: Once | INTRAMUSCULAR | Status: AC
  Administered 2021-08-11: 1000 ug via INTRAMUSCULAR

## 2021-02-03 NOTE — Progress Notes (Signed)
Pt given B12 w/o any complications. °

## 2021-02-06 NOTE — Progress Notes (Signed)
Patient Care Team: Janith Lima, MD as PCP - General (Internal Medicine) Alphonsa Overall, MD as Consulting Physician (General Surgery) Nicholas Lose, MD as Consulting Physician (Hematology and Oncology) Eppie Gibson, MD as Attending Physician (Radiation Oncology) Nunzio Cobbs, MD as Consulting Physician (Obstetrics and Gynecology)  DIAGNOSIS:    ICD-10-CM   1. Malignant neoplasm of lower-inner quadrant of left breast in female, estrogen receptor positive ([place])  C50.312 MM DIAG BREAST TOMO BILATERAL   Z17.0     2. Post-menopausal  Z78.0 DG Bone Density      SUMMARY OF ONCOLOGIC HISTORY: Oncology History  Malignant neoplasm of lower-inner quadrant of left breast in female, estrogen receptor positive ([place])  08/18/2016 Initial Diagnosis   Left breast biopsy 8:00 retroareolar position: IDC, high-grade with DCIS high-grade, ER 90%, PR 95%, Ki-67 20%, HER-2 positive ratio 3.4; screening detected left breast distortion LIQ 8 8:00 retroareolar 0.8 cm, axilla negative, T1b N0 stage IA clinical stage   09/22/2016 Surgery   Left lumpectomy: IDC grade 3, 1.3 cm, DCIS high-grade, margins clear, 0/1 lymph node negative, T1CN 0 stage IA, ER 90%, PR 95%, Ki-67 20%, HER-2 positive ratio 3.4   10/19/2016 - 01/04/2017 Chemotherapy   Taxol Herceptin weekly 12 followed by Herceptin maintenance every 3 weeks for 1 year    01/27/2017 - 03/16/2017 Radiation Therapy   Adjuvant radiation therapy   05/10/2017 -  Anti-estrogen oral therapy   Anastrozole 1 mg p.o. daily     CHIEF COMPLIANT: Follow-up of left breast cancer on anastrozole therapy  INTERVAL HISTORY: Sandra Wood is a 65 y.o. with above-mentioned history of left breast cancer treated with lumpectomy, adjuvant chemotherapy, radiation, and who is currently on anti-estrogen therapy with anastrozole. She presents to the clinic today for follow-up.  She has not had a mammogram this year.  She denies any lumps or nodules in the  breast.  She does not do much activity in form of exercise.  ALLERGIES:  is allergic to metformin and related and no known allergies.  MEDICATIONS:  Current Outpatient Medications  Medication Sig Dispense Refill   anastrozole (ARIMIDEX) 1 MG tablet Take 1 tablet (1 mg total) by mouth daily. 90 tablet 3   blood glucose meter kit and supplies KIT Dispense based on patient and insurance preference. Use up to four times daily as directed. (FOR ICD-9 250.00, 250.01). Check blood sugar BID. (Patient not taking: No sig reported) 1 each 0   Cyanocobalamin 1000 MCG/ML KIT Inject as directed.     glucose blood (ONETOUCH VERIO) test strip Use as instructed 100 each 12   Insulin Glargine (BASAGLAR KWIKPEN) 100 UNIT/ML Inject 38 Units into the skin daily. 45 mL 3   Insulin Pen Needle (BD PEN NEEDLE NANO 2ND GEN) 32G X 4 MM MISC Inject 1 Device into the skin daily. 100 each 3   losartan (COZAAR) 50 MG tablet Take 1 tablet (50 mg total) by mouth daily. 90 tablet 0   nystatin (MYCOSTATIN/NYSTOP) powder Apply topically 3 (three) times daily. Apply to affected area for up to 7 days 45 g 1   nystatin-triamcinolone (MYCOLOG II) cream Apply 1 application topically 2 (two) times daily. Apply to affected area BID for up to 7 days. 60 g 0   rosuvastatin (CRESTOR) 20 MG tablet Take 1 tablet (20 mg total) by mouth daily. 90 tablet 3   Semaglutide (RYBELSUS) 14 MG TABS Take 1 tablet by mouth daily with breakfast. 90 tablet 3   Current Facility-Administered  Medications  Medication Dose Route Frequency Provider Last Rate Last Admin   cyanocobalamin ((VITAMIN B-12)) injection 1,000 mcg  1,000 mcg Intramuscular Q30 days Janith Lima, MD   1,000 mcg at 02/03/21 6387   cyanocobalamin ((VITAMIN B-12)) injection 1,000 mcg  1,000 mcg Intramuscular Once Janith Lima, MD        PHYSICAL EXAMINATION: ECOG PERFORMANCE STATUS: 1 - Symptomatic but completely ambulatory  Vitals:   02/07/21 1054  BP: (!) 145/62  Pulse: 90   Resp: 19  Temp: (!) 97.2 F (36.2 C)  SpO2: 92%   Filed Weights   02/07/21 1054  Weight: (!) 300 lb 3.2 oz (136.2 kg)    BREAST: No palpable masses or nodules in either right or left breasts. No palpable axillary supraclavicular or infraclavicular adenopathy no breast tenderness or nipple discharge. (exam performed in the presence of a chaperone)  LABORATORY DATA:  I have reviewed the data as listed CMP Latest Ref Rng & Units 12/25/2020 11/20/2019 05/22/2019  Glucose 70 - 99 mg/dL 168(H) 124(H) 136(H)  BUN 6 - 23 mg/dL _0 Creatinine 0.40 - 1.20 mg/dL 0.75 0.74 0.62  Sodium 135 - 145 mEq/L 137 141 137  Potassium 3.5 - 5.1 mEq/L 4.4 4.0 4.5  Chloride 96 - 112 mEq/L 100 104 102  CO2 19 - 32 mEq/L _1 Calcium 8.4 - 10.5 mg/dL 9.8 9.7 9.8  Total Protein 6.0 - 8.3 g/dL - - -  Total Bilirubin 0.2 - 1.2 mg/dL - - -  Alkaline Phos 39 - 117 U/L - - -  AST 0 - 37 U/L - - -  ALT 0 - 35 U/L - - -    Lab Results  Component Value Date   WBC 11.9 (H) 11/20/2019   HGB 14.0 11/20/2019   HCT 43.0 11/20/2019   MCV 81.1 11/20/2019   PLT 374.0 11/20/2019   NEUTROABS 7.5 11/20/2019    ASSESSMENT & PLAN:  Malignant neoplasm of lower-inner quadrant of left breast in female, estrogen receptor positive ([facility]) Left breast biopsy 08/18/2016: 8:00 retroareolar position: IDC, high-grade with DCIS high-grade, ER 90%, PR 95%, Ki-67 20%, HER-2 positive ratio 3.4; screening detected left breast distortion LIQ 8 8:00 retroareolar 0.8 cm, axilla negative, T1b N0 stage IA clinical stage Left lumpectomy 09/22/2016: IDC grade 3, 1.3 cm, DCIS high-grade, margins clear, 0/1 lymph node negative, T1 CN 0 stage IA, ER 90%, PR 95%, Ki-67 20%, HER-2 positive ratio 3.4    Treatment summary: 1. Adjuvant chemotherapy and Herceptin with weekly Taxol and Herceptin 12  completed 01/04/2017 followed by Herceptin maintenance for 1 year completed 10/04/2017 3. Adjuvant radiation 01/27/2017-03/16/2017 4. Adjuvant  antiestrogen therapy with letrozole 2.5 mg daily 5 years started 05/10/2017 ----------------------------------------------------------------------------- Diabetes: No change in the current plan   Anastrozole toxicities: Mild hot flashes. She has chronic osteoarthritis related to weight.   Breast cancer surveillance: 1.  Mammogram and ultrasound 02/02/2020: Benign, breast density category B 2. breast exam patient had breast exams done by her gynecologist and her primary care physician.  Therefore we did not do an exam today.   Return to clinic in 1 year for follow-up.  I sent a prescription refill for anastrozole.    Orders Placed This Encounter  Procedures   DG Bone Density    Standing Status:   Future    Standing Expiration Date:   02/07/2022    Scheduling Instructions:     Please set it up at same time as her  mammograms    Order Specific Question:   Reason for Exam (SYMPTOM  OR DIAGNOSIS REQUIRED)    Answer:   Post menopausal    Order Specific Question:   Preferred imaging location?    Answer:   [facility]   MM DIAG BREAST TOMO BILATERAL    Standing Status:   Future    Standing Expiration Date:   02/07/2022    Scheduling Instructions:     At same date as her bone density    Order Specific Question:   Reason for Exam (SYMPTOM  OR DIAGNOSIS REQUIRED)    Answer:   H/O breast cancer    Order Specific Question:   Preferred imaging location?    Answer:   [facility]    Order Specific Question:   Release to patient    Answer:   Immediate   The patient has a good understanding of the overall plan. she agrees with it. she will call with any problems that may develop before the next visit here.  Total time spent: 20 mins including face to face time and time spent for planning, charting and coordination of care  Rulon Eisenmenger, MD, MPH 02/07/2021  I, Thana Ates, am acting as scribe for Dr. Nicholas Lose.  I have reviewed the above documentation for accuracy and  completeness, and I agree with the above.

## 2021-02-07 ENCOUNTER — Inpatient Hospital Stay: Payer: Medicare Other | Attending: Hematology and Oncology | Admitting: Hematology and Oncology

## 2021-02-07 ENCOUNTER — Other Ambulatory Visit: Payer: Self-pay

## 2021-02-07 VITALS — BP 145/62 | HR 90 | Temp 97.2°F | Resp 19 | Ht 62.0 in | Wt 300.2 lb

## 2021-02-07 DIAGNOSIS — Z79811 Long term (current) use of aromatase inhibitors: Secondary | ICD-10-CM | POA: Diagnosis not present

## 2021-02-07 DIAGNOSIS — Z780 Asymptomatic menopausal state: Secondary | ICD-10-CM | POA: Diagnosis not present

## 2021-02-07 DIAGNOSIS — C50312 Malignant neoplasm of lower-inner quadrant of left female breast: Secondary | ICD-10-CM | POA: Diagnosis present

## 2021-02-07 DIAGNOSIS — Z170 Estrogen receptor positive status [ER+]: Secondary | ICD-10-CM | POA: Diagnosis not present

## 2021-02-07 DIAGNOSIS — Z9221 Personal history of antineoplastic chemotherapy: Secondary | ICD-10-CM | POA: Insufficient documentation

## 2021-02-07 DIAGNOSIS — Z923 Personal history of irradiation: Secondary | ICD-10-CM | POA: Diagnosis not present

## 2021-02-07 MED ORDER — ANASTROZOLE 1 MG PO TABS: 1.0000 mg | ORAL_TABLET | Freq: Every day | ORAL | 3 refills | Status: DC

## 2021-02-07 NOTE — Assessment & Plan Note (Signed)
Left breast biopsy02/20/2018:8:00 retroareolar position: IDC, high-grade with DCIS high-grade, ER 90%, PR 95%, Ki-67 20%, HER-2 positive ratio 3.4; screening detected left breast distortion LIQ 8 8:00 retroareolar 0.8 cm, axilla negative, T1b N0 stage IA clinical stage Left lumpectomy 09/22/2016: IDC grade 3, 1.3 cm, DCIS high-grade, margins clear, 0/1 lymph node negative, T1 CN 0 stage IA, ER 90%, PR 95%, Ki-67 20%, HER-2 positive ratio 3.4  Treatment summary: 1. Adjuvant chemotherapy and Herceptin with weekly Taxol and Herceptin 12 completed 01/04/2017 followed by Herceptin maintenance for 1 year completed 10/04/2017 3.Adjuvant radiation8/06/2016-03/16/2017 4.Adjuvant antiestrogen therapy with letrozole 2.5 mg daily 5 yearsstarted 05/10/2017 ----------------------------------------------------------------------------- Diabetes:No change in the current plan  Anastrozoletoxicities: Mild hot flashes. She has chronic osteoarthritis related to weight.  Breast cancer surveillance: 1.Mammogramand ultrasound 02/02/2020: Benign, breast density category B 2.breast exam patient had breast exams done by her gynecologist and her primary care physician.  Therefore we did not do an exam today.  Return to clinic in 1 year for follow-up. I sent a prescription refill for anastrozole.

## 2021-02-10 ENCOUNTER — Encounter: Payer: Self-pay | Admitting: Obstetrics and Gynecology

## 2021-02-10 ENCOUNTER — Other Ambulatory Visit: Payer: Self-pay

## 2021-02-10 ENCOUNTER — Ambulatory Visit (INDEPENDENT_AMBULATORY_CARE_PROVIDER_SITE_OTHER): Payer: Medicare Other | Admitting: Obstetrics and Gynecology

## 2021-02-10 VITALS — BP 136/82 | HR 91 | Ht 61.5 in | Wt 300.0 lb

## 2021-02-10 DIAGNOSIS — Z01419 Encounter for gynecological examination (general) (routine) without abnormal findings: Secondary | ICD-10-CM

## 2021-02-10 DIAGNOSIS — Z853 Personal history of malignant neoplasm of breast: Secondary | ICD-10-CM

## 2021-02-10 DIAGNOSIS — B372 Candidiasis of skin and nail: Secondary | ICD-10-CM | POA: Diagnosis not present

## 2021-02-10 DIAGNOSIS — Z008 Encounter for other general examination: Secondary | ICD-10-CM

## 2021-02-10 DIAGNOSIS — Z1239 Encounter for other screening for malignant neoplasm of breast: Secondary | ICD-10-CM

## 2021-02-10 DIAGNOSIS — Z9289 Personal history of other medical treatment: Secondary | ICD-10-CM | POA: Diagnosis not present

## 2021-02-10 MED ORDER — FLUCONAZOLE 150 MG PO TABS: 150.0000 mg | ORAL_TABLET | Freq: Once | ORAL | 0 refills | Status: AC

## 2021-02-10 MED ORDER — NYSTATIN 100000 UNIT/GM EX POWD: Freq: Three times a day (TID) | CUTANEOUS | 1 refills | Status: DC

## 2021-02-10 MED ORDER — CLOTRIMAZOLE 1 % EX CREA: 1.0000 "application " | TOPICAL_CREAM | Freq: Two times a day (BID) | CUTANEOUS | 1 refills | Status: DC

## 2021-02-10 NOTE — Progress Notes (Signed)
65 y.o. G26P2002 Divorced Caucasian female here for Breast and pelvic.    Has seen her oncologist and her breast surgeon, and she is now due for her mammogram. She is also due for a bone density.   She denies vaginal bleeding or discharge.   She uses Nystatin powder for skin yeast infection.   Received her fist Covid booster.   PCP: Sanda Linger, MD  Patient's last menstrual period was 06/30/2003.           Sexually active: No.  The current method of family planning is tubal ligation/PMP.    Exercising: No.  The patient does not participate in regular exercise at present. Smoker:  Former  Health Maintenance: Pap: 02-06-20 Neg, 07/27/16 Neg:Neg HR HPV History of abnormal Pap:  Hx of ASCUS per patient MMG: 02-02-20 Diag.Bil/Neg/BiRads2/Diag.in 1year--Pt. Knows to schedule--oncology ordered MMG and BMD Colonoscopy: NEVER.  PCP will assist.  BMD: 05-18-17  Result: Osteopenia -- Oncology following TDaP:  03-31-12 Gardasil:   no HIV:09-13-18 NR Hep C:09-13-18 Neg Screening Labs:  PCP.   reports that she quit smoking about 34 years ago. Her smoking use included cigarettes. She has never used smokeless tobacco. She reports that she does not currently use alcohol. She reports that she does not use drugs.  Past Medical History:  Diagnosis Date   Abnormal Pap smear of cervix 2009   Allergic rhinitis, cause unspecified    Arthritis    Arthritis of ankle BILATERAL / HX FX'S   Asthma, mild persistent    Breast cancer (HCC)    Dyspnea    with much activity   Fibroids 2008   GERD (gastroesophageal reflux disease)    not current   History of radiation therapy 01/27/17-03/16/17   left breast 1.8 Gy in 28 fractions, left breast boost 2 Gy in 6 fractions   HSV-2 (herpes simplex virus 2) infection    at age 37   Malignant neoplasm of lower-inner quadrant of left breast in female, estrogen receptor positive (HCC) 08/20/2016   Obesity, morbid (HCC)    Personal history of chemotherapy    Personal  history of radiation therapy    Pneumonia    2013, 2017   Tear of medial meniscus of knee LEFT   Type II or unspecified type diabetes mellitus without mention of complication, uncontrolled    Type II- has never been on medication    Past Surgical History:  Procedure Laterality Date   BREAST BIOPSY     BREAST LUMPECTOMY Left 09/22/2016   BREAST LUMPECTOMY WITH RADIOACTIVE SEED AND SENTINEL LYMPH NODE BIOPSY Left 09/22/2016   Procedure: BREAST LUMPECTOMY WITH RADIOACTIVE SEED AND LEFT AXILLARY SENTINEL LYMPH NODE BIOPSY;  Surgeon: Ovidio Kin, MD;  Location: [facility] OR;  Service: General;  Laterality: Left;   CHONDROPLASTY  08/19/2011   Procedure: CHONDROPLASTY;  Surgeon: Drucilla Schmidt, MD;  Location: [facility];  Service: Orthopedics;;  shaving of medial femeral chondral   DILATION AND CURETTAGE OF UTERUS     HYSTEROSCOPY WITH D & C  2008   benign endometrial polyp - Dr. Delia Heady   KNEE ARTHROSCOPY W/ MENISCECTOMY Bilateral 07/07/2010   1 year apart   MENISECTOMY  08/19/2011   Procedure: MENISECTOMY;  Surgeon: Drucilla Schmidt, MD;  Location: [facility];  Service: Orthopedics;;  partial lateral menisectomy   PORT-A-CATH REMOVAL N/A 12/21/2017   Procedure: REMOVAL PORT-A-CATH;  Surgeon: Ovidio Kin, MD;  Location: [facility] OR;  Service: General;  Laterality: N/A;  PORTACATH PLACEMENT N/A 09/22/2016   Procedure: INSERTION PORT-A-CATH;  Surgeon: Alphonsa Overall, MD;  Location: [facility];  Service: General;  Laterality: N/A;   TONSILLECTOMY     TUBAL LIGATION  AGE 55    Current Outpatient Medications  Medication Sig Dispense Refill   anastrozole (ARIMIDEX) 1 MG tablet Take 1 tablet (1 mg total) by mouth daily. 90 tablet 3   blood glucose meter kit and supplies KIT Dispense based on patient and insurance preference. Use up to four times daily as directed. (FOR ICD-9 250.00, 250.01). Check blood sugar BID. 1 each 0   Cyanocobalamin 1000 MCG/ML KIT Inject as directed.      glucose blood (ONETOUCH VERIO) test strip Use as instructed 100 each 12   Insulin Glargine (BASAGLAR KWIKPEN) 100 UNIT/ML Inject 38 Units into the skin daily. 45 mL 3   Insulin Pen Needle (BD PEN NEEDLE NANO 2ND GEN) 32G X 4 MM MISC Inject 1 Device into the skin daily. 100 each 3   losartan (COZAAR) 50 MG tablet Take 1 tablet (50 mg total) by mouth daily. 90 tablet 0   nystatin-triamcinolone (MYCOLOG II) cream Apply 1 application topically 2 (two) times daily. Apply to affected area BID for up to 7 days. 60 g 0   rosuvastatin (CRESTOR) 20 MG tablet Take 1 tablet (20 mg total) by mouth daily. 90 tablet 3   Semaglutide (RYBELSUS) 14 MG TABS Take 1 tablet by mouth daily with breakfast. 90 tablet 3   nystatin (MYCOSTATIN/NYSTOP) powder Apply topically 3 (three) times daily. Apply to affected area for up to 7 days 45 g 1   Current Facility-Administered Medications  Medication Dose Route Frequency Provider Last Rate Last Admin   cyanocobalamin ((VITAMIN B-12)) injection 1,000 mcg  1,000 mcg Intramuscular Q30 days Janith Lima, MD   1,000 mcg at 02/03/21 2202   cyanocobalamin ((VITAMIN B-12)) injection 1,000 mcg  1,000 mcg Intramuscular Once Janith Lima, MD        Family History  Problem Relation Age of Onset   Diabetes Mother    Heart disease Father    Cancer Brother 39       prostate   Breast cancer Maternal Grandmother 88    Review of Systems  All other systems reviewed and are negative.  Exam:   BP 136/82 (Cuff Size: Large)   Pulse 91   Ht 5' 1.5" (1.562 m)   Wt 300 lb (136.1 kg)   LMP 06/30/2003   SpO2 96%   BMI 55.77 kg/m     General appearance: alert, cooperative and appears stated age Lungs: clear to auscultation bilaterally Breasts: normal appearance, no masses or tenderness, No nipple retraction or dimpling, No nipple discharge or bleeding, No axillary adenopathy Heart: regular rate and rhythm Abdomen: obese with pannus, soft, non-tender; no masses, no  organomegaly Extremities: extremities normal, atraumatic, no cyanosis or edema Skin: skin color, texture, turgor normal. Erythema under pannus and along medial thighs/inguinal region.  Inguinal nodes:  not enlarged. Neurologic: grossly normal  Pelvic: External genitalia:  no lesions              No abnormal inguinal nodes palpated.              Urethra:  normal appearing urethra with no masses, tenderness or lesions              Bartholins and Skenes: normal                 Vagina:  normal appearing vagina with normal color and discharge, no lesions              Cervix: no lesions              Pap taken: No. Bimanual Exam:  Uterus:  normal size, contour, position, consistency, mobility, non-tender.  Exam limited by BMI.               Adnexa: no mass, fullness, tenderness              Rectal exam: yes..  Confirms.              Anus:  normal sphincter tone, no lesions  Chaperone was present for exam:  Estill Bamberg, CMA.  Assessment:    Screening breast exam. Left breast cancer.  Status post lumpectomy, XRT and chemotherapy.  On Arimidex. Pelvic exam with abnormal findings absent. Rectal exam.  Hx HSV. Osteopenia.  Candida of flexural folds. DM.  Plan: Mammogram screening discussed. Self breast awareness reviewed. Pap next year. Guidelines for Calcium, Vitamin D, regular exercise program including cardiovascular and weight bearing exercise. Declines antiviral medication.  Rx for Diflucan, Nystatin powder, and Clotrimazole cream.  Follow up annually and prn.    After visit summary provided.   20 min  total time was spent for this patient encounter, including preparation, face-to-face counseling with the patient, coordination of care, and documentation of the encounter.

## 2021-02-10 NOTE — Patient Instructions (Signed)

## 2021-02-13 ENCOUNTER — Other Ambulatory Visit: Payer: Self-pay

## 2021-02-13 ENCOUNTER — Other Ambulatory Visit: Payer: Self-pay | Admitting: Internal Medicine

## 2021-02-13 ENCOUNTER — Ambulatory Visit
Admission: RE | Admit: 2021-02-13 | Discharge: 2021-02-13 | Disposition: A | Discharge status: Home / Self Care | Payer: Medicare Other | Source: Ambulatory Visit | Attending: Hematology and Oncology | Admitting: Hematology and Oncology

## 2021-02-13 DIAGNOSIS — E1165 Type 2 diabetes mellitus with hyperglycemia: Secondary | ICD-10-CM

## 2021-02-13 DIAGNOSIS — I10 Essential (primary) hypertension: Secondary | ICD-10-CM

## 2021-02-13 DIAGNOSIS — Z170 Estrogen receptor positive status [ER+]: Secondary | ICD-10-CM

## 2021-02-13 DIAGNOSIS — IMO0002 Reserved for concepts with insufficient information to code with codable children: Secondary | ICD-10-CM

## 2021-02-13 DIAGNOSIS — C50312 Malignant neoplasm of lower-inner quadrant of left female breast: Secondary | ICD-10-CM

## 2021-03-04 ENCOUNTER — Emergency Department (HOSPITAL_BASED_OUTPATIENT_CLINIC_OR_DEPARTMENT_OTHER)
Admission: EM | Admit: 2021-03-04 | Discharge: 2021-03-04 | Disposition: A | Discharge status: Home / Self Care | Payer: Medicare Other | Attending: Emergency Medicine | Admitting: Emergency Medicine

## 2021-03-04 ENCOUNTER — Emergency Department (HOSPITAL_BASED_OUTPATIENT_CLINIC_OR_DEPARTMENT_OTHER): Payer: Medicare Other

## 2021-03-04 ENCOUNTER — Encounter (HOSPITAL_BASED_OUTPATIENT_CLINIC_OR_DEPARTMENT_OTHER): Payer: Self-pay | Admitting: Emergency Medicine

## 2021-03-04 ENCOUNTER — Other Ambulatory Visit: Payer: Self-pay

## 2021-03-04 DIAGNOSIS — R0602 Shortness of breath: Secondary | ICD-10-CM | POA: Diagnosis present

## 2021-03-04 DIAGNOSIS — J4530 Mild persistent asthma, uncomplicated: Secondary | ICD-10-CM | POA: Diagnosis not present

## 2021-03-04 DIAGNOSIS — Z794 Long term (current) use of insulin: Secondary | ICD-10-CM | POA: Insufficient documentation

## 2021-03-04 DIAGNOSIS — Z853 Personal history of malignant neoplasm of breast: Secondary | ICD-10-CM | POA: Diagnosis not present

## 2021-03-04 DIAGNOSIS — E119 Type 2 diabetes mellitus without complications: Secondary | ICD-10-CM | POA: Diagnosis not present

## 2021-03-04 DIAGNOSIS — Z87891 Personal history of nicotine dependence: Secondary | ICD-10-CM | POA: Insufficient documentation

## 2021-03-04 DIAGNOSIS — Z8616 Personal history of COVID-19: Secondary | ICD-10-CM | POA: Insufficient documentation

## 2021-03-04 DIAGNOSIS — U099 Post covid-19 condition, unspecified: Secondary | ICD-10-CM

## 2021-03-04 DIAGNOSIS — Z9012 Acquired absence of left breast and nipple: Secondary | ICD-10-CM | POA: Insufficient documentation

## 2021-03-04 DIAGNOSIS — U071 COVID-19: Secondary | ICD-10-CM | POA: Diagnosis not present

## 2021-03-04 DIAGNOSIS — Z79899 Other long term (current) drug therapy: Secondary | ICD-10-CM | POA: Insufficient documentation

## 2021-03-04 LAB — BASIC METABOLIC PANEL
Anion gap: 10 (ref 5–15)
BUN: 13 mg/dL (ref 8–23)
CO2: 23 mmol/L (ref 22–32)
Calcium: 9 mg/dL (ref 8.9–10.3)
Chloride: 101 mmol/L (ref 98–111)
Creatinine, Ser: 0.68 mg/dL (ref 0.44–1.00)
GFR, Estimated: >60 mL/min (ref >60)
Glucose, Bld: 169 mg/dL — ABNORMAL HIGH (ref 70–99)
Potassium: 3.7 mmol/L (ref 3.5–5.1)
Sodium: 134 mmol/L — ABNORMAL LOW (ref 135–145)

## 2021-03-04 LAB — CBC WITH DIFFERENTIAL/PLATELET
Abs Immature Granulocytes: 0.06 10*3/uL (ref 0.00–0.07)
Basophils Absolute: 0 10*3/uL (ref 0.0–0.1)
Basophils Relative: 0 %
Eosinophils Absolute: 0.2 10*3/uL (ref 0.0–0.5)
Eosinophils Relative: 2 %
HCT: 47.8 % — ABNORMAL HIGH (ref 36.0–46.0)
Hemoglobin: 14.9 g/dL (ref 12.0–15.0)
Immature Granulocytes: 1 %
Lymphocytes Relative: 29 %
Lymphs Abs: 2 10*3/uL (ref 0.7–4.0)
MCH: 25.6 pg — ABNORMAL LOW (ref 26.0–34.0)
MCHC: 31.2 g/dL (ref 30.0–36.0)
MCV: 82 fL (ref 80.0–100.0)
Monocytes Absolute: 0.9 10*3/uL (ref 0.1–1.0)
Monocytes Relative: 13 %
Neutro Abs: 3.8 10*3/uL (ref 1.7–7.7)
Neutrophils Relative %: 55 %
Platelets: 254 10*3/uL (ref 150–400)
RBC: 5.83 MIL/uL — ABNORMAL HIGH (ref 3.87–5.11)
RDW: 15.7 % — ABNORMAL HIGH (ref 11.5–15.5)
WBC: 6.9 10*3/uL (ref 4.0–10.5)
nRBC: 0 % (ref 0.0–0.2)

## 2021-03-04 LAB — RESP PANEL BY RT-PCR (FLU A&B, COVID) ARPGX2
Influenza A by PCR: NEGATIVE
Influenza B by PCR: NEGATIVE
SARS Coronavirus 2 by RT PCR: POSITIVE — AB

## 2021-03-04 LAB — BRAIN NATRIURETIC PEPTIDE: B Natriuretic Peptide: 3.3 pg/mL (ref 0.0–100.0)

## 2021-03-04 MED ORDER — BENZONATATE 100 MG PO CAPS: 100.0000 mg | ORAL_CAPSULE | Freq: Three times a day (TID) | ORAL | 0 refills | Status: DC

## 2021-03-04 NOTE — ED Provider Notes (Signed)
[facility] EMERGENCY DEPT Provider Note   CSN: [id number] Arrival date & time: 03/04/21  0857     History Chief Complaint  Patient presents with   Cough   Shortness of Breath    Sandra Wood is a 65 y.o. female.  The history is provided by the patient.  Cough Cough characteristics:  Productive Sputum characteristics:  Nondescript Severity:  Moderate Onset quality:  Sudden Duration:  2 weeks Timing:  Intermittent Progression:  Unchanged Chronicity:  New Context comment:  Had COVID the second week of July. Was sick for 5 days, improved. Did not do Paxlovid Relieved by:  Nothing Worsened by:  Nothing Ineffective treatments:  Cough suppressants Associated symptoms: no chills       Past Medical History:  Diagnosis Date   Abnormal Pap smear of cervix 2009   Allergic rhinitis, cause unspecified    Arthritis    Arthritis of ankle BILATERAL / HX FX'S   Asthma, mild persistent    Breast cancer ([place])    Dyspnea    with much activity   Fibroids 2008   GERD (gastroesophageal reflux disease)    not current   History of radiation therapy 01/27/17-03/16/17   left breast 1.8 Gy in 28 fractions, left breast boost 2 Gy in 6 fractions   HSV-2 (herpes simplex virus 2) infection    at age 72   Malignant neoplasm of lower-inner quadrant of left breast in female, estrogen receptor positive ([place]) 08/20/2016   Obesity, morbid ([place])    Personal history of chemotherapy    Personal history of radiation therapy    Pneumonia    2013, 2017   Tear of medial meniscus of knee LEFT   Type II or unspecified type diabetes mellitus without mention of complication, uncontrolled    Type II- has never been on medication    Patient Active Problem List   Diagnosis Date Noted   Type 2 diabetes mellitus with hyperglycemia, with long-term current use of insulin ([place]) 06/10/2020   Need for pneumococcal vaccination 11/21/2019   Colon cancer screening 11/21/2019   Leukocytosis  11/21/2019   Type 2 diabetes mellitus with diabetic polyneuropathy, without long-term current use of insulin ([place]) 02/01/2019   Essential hypertension 09/14/2018   Vitamin B12 deficiency neuropathy ([place]) 09/14/2018   Hyperlipidemia LDL goal <100 09/13/2018   Screening for colon cancer 09/13/2018   Port catheter in place 11/02/2016   Malignant neoplasm of lower-inner quadrant of left breast in female, estrogen receptor positive ([place]) 08/20/2016   Depression, recurrent ([place]) 07/28/2016   Routine general medical examination at a health care facility 06/02/2012   Abnormal Pap smear of cervix 06/02/2012   Other screening mammogram 06/02/2012   Allergic rhinitis, cause unspecified 05/04/2012   Type II diabetes mellitus with manifestations, uncontrolled ([place]) 04/03/2012   Obesity, morbid ([place]) 03/31/2012   Asthma, mild persistent 01/27/2012   GERD (gastroesophageal reflux disease) 12/30/2011    Past Surgical History:  Procedure Laterality Date   BREAST BIOPSY     BREAST LUMPECTOMY Left 09/22/2016   BREAST LUMPECTOMY WITH RADIOACTIVE SEED AND SENTINEL LYMPH NODE BIOPSY Left 09/22/2016   Procedure: BREAST LUMPECTOMY WITH RADIOACTIVE SEED AND LEFT AXILLARY SENTINEL LYMPH NODE BIOPSY;  Surgeon: Alphonsa Overall, MD;  Location: [facility];  Service: General;  Laterality: Left;   CHONDROPLASTY  08/19/2011   Procedure: CHONDROPLASTY;  Surgeon: Magnus Sinning, MD;  Location: [facility];  Service: Orthopedics;;  shaving of medial femeral chondral   DILATION AND CURETTAGE OF  UTERUS     HYSTEROSCOPY WITH D & C  2008   benign endometrial polyp - Dr. Maryelizabeth Rowan   KNEE ARTHROSCOPY W/ MENISCECTOMY Bilateral 07/07/2010   1 year apart   MENISECTOMY  08/19/2011   Procedure: MENISECTOMY;  Surgeon: Magnus Sinning, MD;  Location: [facility];  Service: Orthopedics;;  partial lateral menisectomy   PORT-A-CATH REMOVAL N/A 12/21/2017   Procedure: REMOVAL PORT-A-CATH;  Surgeon: Alphonsa Overall, MD;  Location: [facility];  Service: General;  Laterality: N/A;   PORTACATH PLACEMENT N/A 09/22/2016   Procedure: INSERTION PORT-A-CATH;  Surgeon: Alphonsa Overall, MD;  Location: [facility];  Service: General;  Laterality: N/A;   TONSILLECTOMY     TUBAL LIGATION  AGE 45     OB History     Gravida  2   Para  2   Term  2   Preterm      AB      Living  2      SAB      IAB      Ectopic      Multiple      Live Births  2           Family History  Problem Relation Age of Onset   Diabetes Mother    Heart disease Father    Cancer Brother 69       prostate   Breast cancer Maternal Grandmother 88    Social History   Tobacco Use   Smoking status: Former    Years: 5.00    Types: Cigarettes    Quit date: 08/13/1986    Years since quitting: 34.5   Smokeless tobacco: Never  Vaping Use   Vaping Use: Never used  Substance Use Topics   Alcohol use: Not Currently   Drug use: No    Home Medications Prior to Admission medications   Medication Sig Start Date End Date Taking? Authorizing Provider  anastrozole (ARIMIDEX) 1 MG tablet Take 1 tablet (1 mg total) by mouth daily. 02/07/21   Nicholas Lose, MD  blood glucose meter kit and supplies KIT Dispense based on patient and insurance preference. Use up to four times daily as directed. (FOR ICD-9 250.00, 250.01). Check blood sugar BID. 12/21/16   Causey, Charlestine Massed, NP  clotrimazole (CLOTRIMAZOLE AF) 1 % cream Apply 1 application topically 2 (two) times daily. Use for 2 weeks as needed. 02/10/21   Nunzio Cobbs, MD  Cyanocobalamin 1000 MCG/ML KIT Inject as directed.    [provider]  glucose blood (ONETOUCH VERIO) test strip Use as instructed 06/10/20   Shamleffer, Melanie Crazier, MD  Insulin Glargine (BASAGLAR KWIKPEN) 100 UNIT/ML Inject 38 Units into the skin daily. 12/25/20   Shamleffer, Melanie Crazier, MD  Insulin Pen Needle (BD PEN NEEDLE NANO 2ND GEN) 32G X 4 MM MISC Inject 1 Device into the skin  daily. 12/25/20   Shamleffer, Melanie Crazier, MD  losartan (COZAAR) 50 MG tablet TAKE 1 TABLET BY MOUTH EVERY DAY 02/13/21   Janith Lima, MD  nystatin (MYCOSTATIN/NYSTOP) powder Apply topically 3 (three) times daily. Apply to affected area for up to 7 days 02/10/21   Nunzio Cobbs, MD  nystatin-triamcinolone [facility] II) cream Apply 1 application topically 2 (two) times daily. Apply to affected area BID for up to 7 days. 09/13/18   Nunzio Cobbs, MD  rosuvastatin (CRESTOR) 20 MG tablet Take 1 tablet (20 mg total) by mouth daily.  12/25/20   Shamleffer, Melanie Crazier, MD  Semaglutide (RYBELSUS) 14 MG TABS Take 1 tablet by mouth daily with breakfast. 12/25/20   Shamleffer, Melanie Crazier, MD  prochlorperazine (COMPAZINE) 10 MG tablet Take 1 tablet (10 mg total) by mouth every 6 (six) hours as needed (Nausea or vomiting). Patient not taking: Reported on 01/11/2017 10/05/16 02/15/17  Nicholas Lose, MD    Allergies    Metformin and related and No known allergies  Review of Systems   Review of Systems  Constitutional:  Negative for chills.  Eyes:  Negative for pain and visual disturbance.  Cardiovascular:  Negative for palpitations and leg swelling.  Genitourinary:  Negative for dysuria and hematuria.  Musculoskeletal:  Negative for arthralgias and back pain.  Skin:  Negative for color change.  Neurological:  Negative for seizures and syncope.  All other systems reviewed and are negative.  Physical Exam Updated Vital Signs BP (!) 144/66   Pulse 95   Temp 99 F (37.2 C)   Resp (!) 22   Ht $R'5\' 3"'Iy$  (1.6 m)   Wt 132.9 kg   LMP 06/30/2003   SpO2 95%   BMI 51.90 kg/m   Physical Exam Vitals and nursing note reviewed.  Constitutional:      Appearance: She is well-developed.  HENT:     Head: Normocephalic and atraumatic.  Cardiovascular:     Rate and Rhythm: Normal rate and regular rhythm.     Heart sounds: Normal heart sounds.  Pulmonary:     Effort: Pulmonary  effort is normal. No tachypnea.     Breath sounds: Normal breath sounds.  Musculoskeletal:     Right lower leg: No edema.     Left lower leg: No edema.  Skin:    General: Skin is warm and dry.  Neurological:     General: No focal deficit present.     Mental Status: She is alert and oriented to person, place, and time.  Psychiatric:        Mood and Affect: Mood normal.        Behavior: Behavior normal.    ED Results / Procedures / Treatments   Labs (all labs ordered are listed, but only abnormal results are displayed) Labs Reviewed  RESP PANEL BY RT-PCR (FLU A&B, COVID) ARPGX2 - Abnormal; Notable for the following components:      Result Value   SARS Coronavirus 2 by RT PCR POSITIVE (*)    All other components within normal limits  BASIC METABOLIC PANEL - Abnormal; Notable for the following components:   Sodium 134 (*)    Glucose, Bld 169 (*)    All other components within normal limits  CBC WITH DIFFERENTIAL/PLATELET - Abnormal; Notable for the following components:   RBC 5.83 (*)    HCT 47.8 (*)    MCH 25.6 (*)    RDW 15.7 (*)    All other components within normal limits  BRAIN NATRIURETIC PEPTIDE    EKG EKG Interpretation  Date/Time:  Tuesday March 04 2021 09:43:38 EDT Ventricular Rate:  90 PR Interval:  161 QRS Duration: 91 QT Interval:  356 QTC Calculation: 436 R Axis:   -28 Text Interpretation: Sinus rhythm Borderline left axis deviation No acute ischemia Confirmed by Lorre Munroe ([id number]) on 03/04/2021 9:49:17 AM  Radiology DG Chest Port 1 View  Result Date: 03/04/2021 CLINICAL DATA:  Cough, evaluate pneumonia EXAM: PORTABLE CHEST 1 VIEW COMPARISON:  09/22/2016 FINDINGS: Mild cardiomegaly. Mild, diffuse interstitial pulmonary opacity. The visualized skeletal structures are  unremarkable. IMPRESSION: Mild cardiomegaly with diffuse interstitial pulmonary opacity, which may reflect edema and/or chronic interstitial change. No focal airspace opacity. Electronically  Signed   By: Eddie Candle M.D.   On: 03/04/2021 09:44    Procedures Procedures   Medications Ordered in ED Medications - No data to display  ED Course  I have reviewed the triage vital signs and the nursing notes.  Pertinent labs & imaging results that were available during my care of the patient were reviewed by me and considered in my medical decision making (see chart for details).    MDM Rules/Calculators/A&P                           Army Melia presents with 2 weeks of a cough.  She recently recovered from COVID-19 fairly uneventfully.  She has had some dyspnea and chest pain only with cough.  She is well-appearing here.  No hypoxia.  ED work-up was fairly unremarkable.  Chest x-ray findings may be interstitial changes from COVID-19.  No clinical signs or symptoms of CHF.  BNP is normal.  I would recommend Tessalon Perles for symptomatic relief and follow-up with her primary care doctor and/or pulmonology.  I did consider PE.  Patient has chest pain only with cough, and this seemed much less likely.  No indication of ACS. Final Clinical Impression(s) / ED Diagnoses Final diagnoses:  [id number] long hauler manifesting chronic cough    Rx / DC Orders ED Discharge Orders     None        Arnaldo Natal, MD 03/04/21 [phone]

## 2021-03-04 NOTE — ED Triage Notes (Signed)
Pt to ER with c/o cough for last 2 weeks.  Pt states cough is productive of yellow/brown sputum.  Pt also reports SHOB, denies fever.  Pt noted to have congested cough with expiratory wheezes on arrival.

## 2021-03-07 ENCOUNTER — Ambulatory Visit (INDEPENDENT_AMBULATORY_CARE_PROVIDER_SITE_OTHER): Payer: Medicare Other

## 2021-03-07 ENCOUNTER — Other Ambulatory Visit: Payer: Self-pay

## 2021-03-07 DIAGNOSIS — E538 Deficiency of other specified B group vitamins: Secondary | ICD-10-CM | POA: Diagnosis not present

## 2021-03-07 DIAGNOSIS — G63 Polyneuropathy in diseases classified elsewhere: Secondary | ICD-10-CM | POA: Diagnosis not present

## 2021-03-07 MED ORDER — CYANOCOBALAMIN 1000 MCG/ML IJ SOLN
1000.0000 ug | Freq: Once | INTRAMUSCULAR | Status: AC
  Administered 2021-03-07: 1000 ug via INTRAMUSCULAR

## 2021-03-07 NOTE — Progress Notes (Signed)
Pt here for monthly B12 injection per Dr. Ronnald Ramp  B12 1052mg given IM, and pt tolerated injection well.  Next B12 injection scheduled for 04/03/21

## 2021-04-03 ENCOUNTER — Encounter: Payer: Self-pay | Admitting: Internal Medicine

## 2021-04-03 ENCOUNTER — Ambulatory Visit (INDEPENDENT_AMBULATORY_CARE_PROVIDER_SITE_OTHER): Payer: Medicare Other | Admitting: Internal Medicine

## 2021-04-03 ENCOUNTER — Ambulatory Visit (INDEPENDENT_AMBULATORY_CARE_PROVIDER_SITE_OTHER): Payer: Medicare Other

## 2021-04-03 ENCOUNTER — Other Ambulatory Visit: Payer: Self-pay

## 2021-04-03 VITALS — BP 136/84 | HR 91 | Temp 98.3°F | Resp 16 | Ht 63.0 in | Wt 296.0 lb

## 2021-04-03 DIAGNOSIS — E1165 Type 2 diabetes mellitus with hyperglycemia: Secondary | ICD-10-CM | POA: Diagnosis not present

## 2021-04-03 DIAGNOSIS — Z794 Long term (current) use of insulin: Secondary | ICD-10-CM

## 2021-04-03 DIAGNOSIS — E1142 Type 2 diabetes mellitus with diabetic polyneuropathy: Secondary | ICD-10-CM | POA: Diagnosis not present

## 2021-04-03 DIAGNOSIS — G63 Polyneuropathy in diseases classified elsewhere: Secondary | ICD-10-CM

## 2021-04-03 DIAGNOSIS — I10 Essential (primary) hypertension: Secondary | ICD-10-CM

## 2021-04-03 DIAGNOSIS — Z23 Encounter for immunization: Secondary | ICD-10-CM | POA: Diagnosis not present

## 2021-04-03 DIAGNOSIS — E538 Deficiency of other specified B group vitamins: Secondary | ICD-10-CM

## 2021-04-03 DIAGNOSIS — R058 Other specified cough: Secondary | ICD-10-CM

## 2021-04-03 LAB — TSH: TSH: 3.02 u[IU]/mL (ref 0.35–5.50)

## 2021-04-03 LAB — BASIC METABOLIC PANEL
BUN: 9 mg/dL (ref 6–23)
CO2: 27 mEq/L (ref 19–32)
Calcium: 9.7 mg/dL (ref 8.4–10.5)
Chloride: 103 mEq/L (ref 96–112)
Creatinine, Ser: 0.7 mg/dL (ref 0.40–1.20)
GFR: 90.06 mL/min (ref >60.00)
Glucose, Bld: 136 mg/dL — ABNORMAL HIGH (ref 70–99)
Potassium: 4.3 mEq/L (ref 3.5–5.1)
Sodium: 137 mEq/L (ref 135–145)

## 2021-04-03 LAB — FOLATE: Folate: 9.3 ng/mL (ref >5.9)

## 2021-04-03 LAB — HEMOGLOBIN A1C: Hgb A1c MFr Bld: 7.9 % — ABNORMAL HIGH (ref 4.6–6.5)

## 2021-04-03 MED ORDER — CYANOCOBALAMIN 1000 MCG/ML IJ SOLN
1000.0000 ug | Freq: Once | INTRAMUSCULAR | Status: AC
  Administered 2021-04-03: 1000 ug via INTRAMUSCULAR

## 2021-04-03 MED ORDER — EMPAGLIFLOZIN 25 MG PO TABS: 25.0000 mg | ORAL_TABLET | Freq: Every day | ORAL | 1 refills | Status: DC

## 2021-04-03 MED ORDER — RYBELSUS 14 MG PO TABS: 1.0000 | ORAL_TABLET | Freq: Every day | ORAL | 1 refills | Status: DC

## 2021-04-03 NOTE — Patient Instructions (Signed)

## 2021-04-03 NOTE — Progress Notes (Signed)
Subjective:  Patient ID: Sandra Wood, female    DOB: 1955-08-10  Age: 65 y.o. MRN: [id number]  CC: Cough and Diabetes  This visit occurred during the SARS-CoV-2 public health emergency.  Safety protocols were in place, including screening questions prior to the visit, additional usage of staff PPE, and extensive cleaning of exam room while observing appropriate contact time as indicated for disinfecting solutions.    HPI Sandra Wood presents for f/up -  She was treated for COVID-19 about 3 months ago - she is improving but has a lingering cough that is rarely productive of yellow phlegm.  Outpatient Medications Prior to Visit  Medication Sig Dispense Refill   anastrozole (ARIMIDEX) 1 MG tablet Take 1 tablet (1 mg total) by mouth daily. 90 tablet 3   blood glucose meter kit and supplies KIT Dispense based on patient and insurance preference. Use up to four times daily as directed. (FOR ICD-9 250.00, 250.01). Check blood sugar BID. 1 each 0   clotrimazole (CLOTRIMAZOLE AF) 1 % cream Apply 1 application topically 2 (two) times daily. Use for 2 weeks as needed. 30 g 1   Cyanocobalamin 1000 MCG/ML KIT Inject as directed.     glucose blood (ONETOUCH VERIO) test strip Use as instructed 100 each 12   Insulin Glargine (BASAGLAR KWIKPEN) 100 UNIT/ML Inject 38 Units into the skin daily. 45 mL 3   Insulin Pen Needle (BD PEN NEEDLE NANO 2ND GEN) 32G X 4 MM MISC Inject 1 Device into the skin daily. 100 each 3   losartan (COZAAR) 50 MG tablet TAKE 1 TABLET BY MOUTH EVERY DAY 90 tablet 0   nystatin (MYCOSTATIN/NYSTOP) powder Apply topically 3 (three) times daily. Apply to affected area for up to 7 days 45 g 1   nystatin-triamcinolone (MYCOLOG II) cream Apply 1 application topically 2 (two) times daily. Apply to affected area BID for up to 7 days. 60 g 0   rosuvastatin (CRESTOR) 20 MG tablet Take 1 tablet (20 mg total) by mouth daily. 90 tablet 3   benzonatate (TESSALON) 100 MG capsule Take 1  capsule (100 mg total) by mouth every 8 (eight) hours. 21 capsule 0   Semaglutide (RYBELSUS) 14 MG TABS Take 1 tablet by mouth daily with breakfast. 90 tablet 3   Facility-Administered Medications Prior to Visit  Medication Dose Route Frequency Provider Last Rate Last Admin   cyanocobalamin ((VITAMIN B-12)) injection 1,000 mcg  1,000 mcg Intramuscular Once Etta Grandchild, MD        ROS Review of Systems  Constitutional:  Negative for chills, diaphoresis, fatigue and fever.  Eyes: Negative.   Respiratory:  Positive for cough. Negative for chest tightness, shortness of breath and wheezing.   Cardiovascular:  Negative for chest pain, palpitations and leg swelling.  Gastrointestinal:  Negative for abdominal pain, constipation, diarrhea, nausea and vomiting.  Endocrine: Negative.   Genitourinary: Negative.  Negative for difficulty urinating.  Musculoskeletal:  Negative for arthralgias and joint swelling.  Skin: Negative.   Neurological:  Negative for dizziness, weakness, light-headedness and headaches.  Hematological:  Negative for adenopathy. Does not bruise/bleed easily.  Psychiatric/Behavioral: Negative.     Objective:  BP 136/84 (BP Location: Right Arm, Patient Position: Sitting, Cuff Size: Large)   Pulse 91   Temp 98.3 F (36.8 C) (Oral)   Resp 16   Ht 5\' 3"  (1.6 m)   Wt 296 lb (134.3 kg)   LMP 06/30/2003   SpO2 92%   BMI 52.43 kg/m  BP Readings from Last 3 Encounters:  04/03/21 136/84  03/04/21 (!) 101/55  02/10/21 136/82    Wt Readings from Last 3 Encounters:  04/03/21 296 lb (134.3 kg)  03/04/21 293 lb (132.9 kg)  02/10/21 300 lb (136.1 kg)    Physical Exam Vitals reviewed.  Constitutional:      Appearance: She is obese. She is not ill-appearing.  HENT:     Nose: Nose normal.     Mouth/Throat:     Mouth: Mucous membranes are moist.  Eyes:     Conjunctiva/sclera: Conjunctivae normal.  Cardiovascular:     Rate and Rhythm: Normal rate and regular rhythm.      Heart sounds: No murmur heard. Pulmonary:     Effort: Pulmonary effort is normal.     Breath sounds: No stridor. No wheezing, rhonchi or rales.  Abdominal:     General: Abdomen is protuberant. Bowel sounds are normal. There is no distension.     Palpations: Abdomen is soft. There is no hepatomegaly, splenomegaly or mass.     Tenderness: There is no abdominal tenderness.  Musculoskeletal:        General: Normal range of motion.     Cervical back: Neck supple.     Right lower leg: No edema.     Left lower leg: No edema.  Lymphadenopathy:     Cervical: No cervical adenopathy.  Skin:    General: Skin is warm and dry.  Neurological:     General: No focal deficit present.  Psychiatric:        Mood and Affect: Mood normal.        Behavior: Behavior normal.    Lab Results  Component Value Date   WBC 6.9 03/04/2021   HGB 14.9 03/04/2021   HCT 47.8 (H) 03/04/2021   PLT 254 03/04/2021   GLUCOSE 136 (H) 04/03/2021   CHOL 130 12/25/2020   TRIG 127.0 12/25/2020   HDL 42.60 12/25/2020   LDLDIRECT 177.0 09/13/2018   LDLCALC 62 12/25/2020   ALT 17 09/13/2018   AST 14 09/13/2018   NA 137 04/03/2021   K 4.3 04/03/2021   CL 103 04/03/2021   CREATININE 0.70 04/03/2021   BUN 9 04/03/2021   CO2 27 04/03/2021   TSH 3.02 04/03/2021   INR 1.1 RATIO 07/09/2006   HGBA1C 7.9 (H) 04/03/2021   MICROALBUR 1.1 12/25/2020    DG Chest Port 1 View  Result Date: 03/04/2021 CLINICAL DATA:  Cough, evaluate pneumonia EXAM: PORTABLE CHEST 1 VIEW COMPARISON:  09/22/2016 FINDINGS: Mild cardiomegaly. Mild, diffuse interstitial pulmonary opacity. The visualized skeletal structures are unremarkable. IMPRESSION: Mild cardiomegaly with diffuse interstitial pulmonary opacity, which may reflect edema and/or chronic interstitial change. No focal airspace opacity. Electronically Signed   By: Eddie Candle M.D.   On: 03/04/2021 09:44    DG Chest 2 View  Result Date: 04/04/2021 CLINICAL DATA:  Productive  cough. EXAM: CHEST - 2 VIEW COMPARISON:  March 04, 2021. FINDINGS: The heart size and mediastinal contours are within normal limits. Both lungs are clear. The visualized skeletal structures are unremarkable. IMPRESSION: No active cardiopulmonary disease. Electronically Signed   By: Marijo Conception M.D.   On: 04/04/2021 16:44     Assessment & Plan:   Sandra Wood was seen today for cough and diabetes.  Diagnoses and all orders for this visit:  Essential hypertension- Her BP is well controlled. -     Cancel: CBC with Differential/Platelet; Future -     Basic metabolic panel; Future -  TSH; Future -     TSH -     Basic metabolic panel  Type 2 diabetes mellitus with diabetic polyneuropathy, without long-term current use of insulin (Sandra Wood)- Her A1C is up to 7.9%. Will add an SGLT2 inh to the GLP-1 ag. -     Semaglutide (RYBELSUS) 14 MG TABS; Take 1 tablet by mouth daily with breakfast. -     HM Diabetes Foot Exam -     Hemoglobin A1c; Future -     Hemoglobin A1c -     empagliflozin (JARDIANCE) 25 MG TABS tablet; Take 1 tablet (25 mg total) by mouth daily before breakfast.  Type 2 diabetes mellitus with hyperglycemia, with long-term current use of insulin (Sandra Wood) -     Semaglutide (RYBELSUS) 14 MG TABS; Take 1 tablet by mouth daily with breakfast. -     HM Diabetes Foot Exam -     Hemoglobin A1c; Future -     Hemoglobin A1c -     empagliflozin (JARDIANCE) 25 MG TABS tablet; Take 1 tablet (25 mg total) by mouth daily before breakfast.  Obesity, morbid (Sandra Wood)- Labs are negative for secondary causes. -     TSH; Future -     TSH  Flu vaccine need -     Flu Vaccine QUAD High Dose(Fluad)  Cough productive of yellow sputum- Her CXR is neg for mass, interstitial changes, infiltrate. -     DG Chest 2 View; Future  Vitamin B12 deficiency neuropathy (Sandra Wood) -     Folate; Future -     cyanocobalamin ((VITAMIN B-12)) injection 1,000 mcg -     Folate  I have discontinued Sandra Wood  "Sandra Wood"'s benzonatate. I am also having her start on empagliflozin. Additionally, I am having her maintain her blood glucose meter kit and supplies, nystatin-triamcinolone, Cyanocobalamin, OneTouch Verio, Basaglar KwikPen, rosuvastatin, BD Pen Needle Nano 2nd Gen, anastrozole, nystatin, clotrimazole, losartan, and Rybelsus. We administered cyanocobalamin. We will continue to administer cyanocobalamin.  Meds ordered this encounter  Medications   Semaglutide (RYBELSUS) 14 MG TABS    Sig: Take 1 tablet by mouth daily with breakfast.    Dispense:  90 tablet    Refill:  1   cyanocobalamin ((VITAMIN B-12)) injection 1,000 mcg   empagliflozin (JARDIANCE) 25 MG TABS tablet    Sig: Take 1 tablet (25 mg total) by mouth daily before breakfast.    Dispense:  90 tablet    Refill:  1     Follow-up: Return in about 4 weeks (around 05/01/2021).  Scarlette Calico, MD

## 2021-05-08 ENCOUNTER — Ambulatory Visit (INDEPENDENT_AMBULATORY_CARE_PROVIDER_SITE_OTHER): Payer: Medicare Other

## 2021-05-08 ENCOUNTER — Other Ambulatory Visit: Payer: Self-pay

## 2021-05-08 DIAGNOSIS — G63 Polyneuropathy in diseases classified elsewhere: Secondary | ICD-10-CM

## 2021-05-08 DIAGNOSIS — E538 Deficiency of other specified B group vitamins: Secondary | ICD-10-CM | POA: Diagnosis not present

## 2021-05-08 MED ORDER — CYANOCOBALAMIN 1000 MCG/ML IJ SOLN
1000.0000 ug | Freq: Once | INTRAMUSCULAR | Status: AC
  Administered 2021-05-08: 1000 ug via INTRAMUSCULAR

## 2021-05-08 NOTE — Progress Notes (Signed)
Pt given B12 w/o any complications. °

## 2021-06-09 ENCOUNTER — Ambulatory Visit (INDEPENDENT_AMBULATORY_CARE_PROVIDER_SITE_OTHER): Payer: Medicare Other

## 2021-06-09 ENCOUNTER — Other Ambulatory Visit: Payer: Self-pay

## 2021-06-09 DIAGNOSIS — G63 Polyneuropathy in diseases classified elsewhere: Secondary | ICD-10-CM

## 2021-06-09 DIAGNOSIS — E538 Deficiency of other specified B group vitamins: Secondary | ICD-10-CM

## 2021-06-09 MED ORDER — CYANOCOBALAMIN 1000 MCG/ML IJ SOLN
1000.0000 ug | Freq: Once | INTRAMUSCULAR | Status: AC
  Administered 2021-06-09: 1000 ug via INTRAMUSCULAR

## 2021-06-09 NOTE — Progress Notes (Signed)
Pt given B12 w/o any complications. °

## 2021-06-13 ENCOUNTER — Ambulatory Visit: Payer: Medicare Other | Admitting: Internal Medicine

## 2021-07-04 ENCOUNTER — Ambulatory Visit: Payer: Medicare Other | Admitting: Internal Medicine

## 2021-07-04 NOTE — Progress Notes (Deleted)
Name: Sandra Wood  Age/ Sex: 66 y.o., female   MRN/ DOB: [id number], 11/19/1955     PCP: Etta Grandchild, MD   Reason for Endocrinology Evaluation: Type 2 Diabetes Mellitus  Initial Endocrine Consultative Visit: 11/07/2018    PATIENT IDENTIFIER: Ms. Sandra Wood is a 66 y.o. female with a past medical history of HTN, T2DM, Asthma, Hx of Breast Ca (2018). The patient has followed with Endocrinology clinic since 11/07/2018 for consultative assistance with management of her diabetes.  DIABETIC HISTORY:  Sandra Wood was diagnosed with T2DM in 2013. Pt was on diet control until 08/2018 when she was started on Metformin, Rybelsus and Insulin. Her hemoglobin A1c has ranged from 6.6% in 2013, peaking at 12.5% in 2020  On her initial visit to our clinic her A1c was 8.8 % . She was on Rybelsus, tresiba and Metformin  Stopped Metformin 10/2020 due to GI side effects   SUBJECTIVE:   During the last visit (12/25/2020): A1c 7.7% . Increased  basaglar  and continued Rybelsus     Today (07/04/2021): Sandra Wood is here for a follow up on her diabetes management.  She has not been checking glucose    She is lactose intolerant and has occasional diarrhea due to that  Had COVID in 02/2021  HOME DIABETES REGIMEN:  Basaglar  38 units daily  Rybelsus 14 mg daily       DIABETIC COMPLICATIONS: Microvascular complications:  neuropathy Denies: CKD,  Last eye exam: Completed 2021 Macrovascular complications:   Denies: CAD, PVD, CVA      HISTORY:  Past Medical History:  Past Medical History:  Diagnosis Date   Abnormal Pap smear of cervix 2009   Allergic rhinitis, cause unspecified    Arthritis    Arthritis of ankle BILATERAL / HX FX'S   Asthma, mild persistent    Breast cancer (HCC)    Dyspnea    with much activity   Fibroids 2008   GERD (gastroesophageal reflux disease)    not current   History of radiation therapy 01/27/17-03/16/17   left breast 1.8 Gy in 28 fractions,  left breast boost 2 Gy in 6 fractions   HSV-2 (herpes simplex virus 2) infection    at age 86   Malignant neoplasm of lower-inner quadrant of left breast in female, estrogen receptor positive (HCC) 08/20/2016   Obesity, morbid (HCC)    Personal history of chemotherapy    Personal history of radiation therapy    Pneumonia    2013, 2017   Tear of medial meniscus of knee LEFT   Type II or unspecified type diabetes mellitus without mention of complication, uncontrolled    Type II- has never been on medication   Past Surgical History:  Past Surgical History:  Procedure Laterality Date   BREAST BIOPSY     BREAST LUMPECTOMY Left 09/22/2016   BREAST LUMPECTOMY WITH RADIOACTIVE SEED AND SENTINEL LYMPH NODE BIOPSY Left 09/22/2016   Procedure: BREAST LUMPECTOMY WITH RADIOACTIVE SEED AND LEFT AXILLARY SENTINEL LYMPH NODE BIOPSY;  Surgeon: Ovidio Kin, MD;  Location: [facility] OR;  Service: General;  Laterality: Left;   CHONDROPLASTY  08/19/2011   Procedure: CHONDROPLASTY;  Surgeon: Drucilla Schmidt, MD;  Location: [facility];  Service: Orthopedics;;  shaving of medial femeral chondral   DILATION AND CURETTAGE OF UTERUS     HYSTEROSCOPY WITH D & C  2008   benign endometrial polyp - Dr. Delia Heady   KNEE ARTHROSCOPY W/ MENISCECTOMY Bilateral 07/07/2010  1 year apart   MENISECTOMY  08/19/2011   Procedure: MENISECTOMY;  Surgeon: Drucilla Schmidt, MD;  Location: [facility];  Service: Orthopedics;;  partial lateral menisectomy   PORT-A-CATH REMOVAL N/A 12/21/2017   Procedure: REMOVAL PORT-A-CATH;  Surgeon: Ovidio Kin, MD;  Location: [facility] OR;  Service: General;  Laterality: N/A;   PORTACATH PLACEMENT N/A 09/22/2016   Procedure: INSERTION PORT-A-CATH;  Surgeon: Ovidio Kin, MD;  Location: [facility] OR;  Service: General;  Laterality: N/A;   TONSILLECTOMY     TUBAL LIGATION  AGE 47   Social History:  reports that she quit smoking about 34 years ago. Her smoking use included  cigarettes. She has never used smokeless tobacco. She reports that she does not currently use alcohol. She reports that she does not use drugs. Family History:  Family History  Problem Relation Age of Onset   Diabetes Mother    Heart disease Father    Cancer Brother 21       prostate   Breast cancer Maternal Grandmother 72     HOME MEDICATIONS: Allergies as of 07/04/2021       Reactions   Metformin And Related    GI upset    No Known Allergies         Medication List        Accurate as of July 04, 2021  9:33 AM. If you have any questions, ask your nurse or doctor.          anastrozole 1 MG tablet Commonly known as: ARIMIDEX Take 1 tablet (1 mg total) by mouth daily.   Basaglar KwikPen 100 UNIT/ML Inject 38 Units into the skin daily.   BD Pen Needle Nano 2nd Gen 32G X 4 MM Misc Generic drug: Insulin Pen Needle Inject 1 Device into the skin daily.   blood glucose meter kit and supplies Kit Dispense based on patient and insurance preference. Use up to four times daily as directed. (FOR ICD-9 250.00, 250.01). Check blood sugar BID.   clotrimazole 1 % cream Commonly known as: Clotrimazole AF Apply 1 application topically 2 (two) times daily. Use for 2 weeks as needed.   Cyanocobalamin 1000 MCG/ML Kit Inject as directed.   empagliflozin 25 MG Tabs tablet Commonly known as: Jardiance Take 1 tablet (25 mg total) by mouth daily before breakfast.   losartan 50 MG tablet Commonly known as: COZAAR TAKE 1 TABLET BY MOUTH EVERY DAY   nystatin powder Commonly known as: MYCOSTATIN/NYSTOP Apply topically 3 (three) times daily. Apply to affected area for up to 7 days   nystatin-triamcinolone cream Commonly known as: MYCOLOG II Apply 1 application topically 2 (two) times daily. Apply to affected area BID for up to 7 days.   OneTouch Verio test strip Generic drug: glucose blood Use as instructed   rosuvastatin 20 MG tablet Commonly known as: CRESTOR Take 1  tablet (20 mg total) by mouth daily.   Rybelsus 14 MG Tabs Generic drug: Semaglutide Take 1 tablet by mouth daily with breakfast.         OBJECTIVE:   Vital Signs: LMP 06/30/2003   Wt Readings from Last 3 Encounters:  04/03/21 296 lb (134.3 kg)  03/04/21 293 lb (132.9 kg)  02/10/21 300 lb (136.1 kg)     Exam: General: Pt appears well and is in NAD  Lungs: Clear with good BS bilat with no rales, rhonchi, or wheezes  Heart: RRR with normal S1 and S2 and no gallops; no murmurs; no rub  Extremities: No pretibial edema.   Neuro: MS is good with appropriate affect, pt is alert and Ox3       DM foot exam: 06/10/2020    The skin of the feet is intact without sores or ulcerations.Plantar callous formation noted bilaterally The pedal pulses are 2+ on right and 2+ on left. The sensation is decreased  to a screening 5.07, 10 gram monofilament bilaterally       DATA REVIEWED:  Lab Results  Component Value Date   HGBA1C 7.9 (H) 04/03/2021   HGBA1C 7.7 (A) 12/25/2020   HGBA1C 7.8 (A) 06/10/2020   Results for Fassler, Ashanty L "CINDY" (MRN [id number]) as of 12/25/2020 16:09  Ref. Range 12/25/2020 09:39  Sodium Latest Ref Range: 135 - 145 mEq/L 137  Potassium Latest Ref Range: 3.5 - 5.1 mEq/L 4.4  Chloride Latest Ref Range: 96 - 112 mEq/L 100  CO2 Latest Ref Range: 19 - 32 mEq/L 27  Glucose Latest Ref Range: 70 - 99 mg/dL 168 (H)  BUN Latest Ref Range: 6 - 23 mg/dL 14  Creatinine Latest Ref Range: 0.40 - 1.20 mg/dL 0.75  Calcium Latest Ref Range: 8.4 - 10.5 mg/dL 9.8  GFR Latest Ref Range: >60.00 mL/min 83.07  Total CHOL/HDL Ratio Unknown 3  Cholesterol Latest Ref Range: 0 - 200 mg/dL 130  HDL Cholesterol Latest Ref Range: >39.00 mg/dL 42.60  LDL (calc) Latest Ref Range: 0 - 99 mg/dL 62  MICROALB/CREAT RATIO Latest Ref Range: 0.0 - 30.0 mg/g 0.6  NonHDL Unknown 87.37  Triglycerides Latest Ref Range: 0.0 - 149.0 mg/dL 127.0  VLDL Latest Ref Range: 0.0 - 40.0 mg/dL 25.4   Creatinine,U Latest Units: mg/dL 164.2  Microalb, Ur Latest Ref Range: 0.0 - 1.9 mg/dL 1.1     ASSESSMENT / PLAN / RECOMMENDATIONS:   1) Type 2 Diabetes Mellitus, Sub- Optimally controlled, With Neuropathic complications - Most recent A1c of 7.7 %. Goal A1c < 7.0 %.    - A1c continues to be above goal  - She stopped metformin due to GI issues  - We discussed add-on therpay with SGLT-2 inhibitors but she opted to increase insulin instead at this time    MEDICATIONS: - Increase  Basaglar 38 Units daily  - Continue Rybelsus 14 mg daily    EDUCATION / INSTRUCTIONS: BG monitoring instructions: Patient is instructed to check her blood sugars 2 times a day, fasting and bedtime. Call [place] [facility] if: BG persistently < 70 I reviewed the Rule of 15 for the treatment of hypoglycemia in detail with the patient. Literature supplied.   2) Dyslipidemia :  - Repeat lipid today is at goal  Medication Continue rosuvastatin 20 mg daily   F/U in 4 months    Signed electronically by: Mack Guise, MD  [facility] Endocrinology  [facility] [address] [place],  [address] Phone: [phone] FAX: [phone]   CC: Janith Lima, MD [place] [address] Phone: [phone]  Fax: [phone]  Return to Endocrinology clinic as below: Future Appointments  Date Time Provider [place]  07/04/2021 10:50 AM Tinleigh Whitmire, Melanie Crazier, MD [facility] None  07/10/2021  9:00 AM [facility] NURSE [facility] None  08/05/2021  9:00 AM [facility] DX DEXA 1 [facility] GI-BREAST CE  03/03/2022  9:30 AM Nicholas Lose, MD [facility] None

## 2021-07-10 ENCOUNTER — Other Ambulatory Visit: Payer: Self-pay

## 2021-07-10 ENCOUNTER — Ambulatory Visit (INDEPENDENT_AMBULATORY_CARE_PROVIDER_SITE_OTHER): Payer: Medicare Other

## 2021-07-10 DIAGNOSIS — G63 Polyneuropathy in diseases classified elsewhere: Secondary | ICD-10-CM

## 2021-07-10 DIAGNOSIS — E538 Deficiency of other specified B group vitamins: Secondary | ICD-10-CM

## 2021-07-10 MED ORDER — CYANOCOBALAMIN 1000 MCG/ML IJ SOLN
1000.0000 ug | Freq: Once | INTRAMUSCULAR | Status: AC
Start: 2021-07-10 — End: 2021-07-10
  Administered 2021-07-10: 1000 ug via INTRAMUSCULAR

## 2021-07-10 NOTE — Progress Notes (Signed)
Pt was given B12 w/o any complications. 

## 2021-07-17 ENCOUNTER — Other Ambulatory Visit: Payer: Self-pay

## 2021-07-17 ENCOUNTER — Ambulatory Visit (INDEPENDENT_AMBULATORY_CARE_PROVIDER_SITE_OTHER): Payer: Medicare Other | Admitting: Internal Medicine

## 2021-07-17 ENCOUNTER — Encounter: Payer: Self-pay | Admitting: Internal Medicine

## 2021-07-17 VITALS — BP 132/80 | HR 92 | Ht 63.0 in | Wt 293.6 lb

## 2021-07-17 DIAGNOSIS — E1165 Type 2 diabetes mellitus with hyperglycemia: Secondary | ICD-10-CM

## 2021-07-17 DIAGNOSIS — E1142 Type 2 diabetes mellitus with diabetic polyneuropathy: Secondary | ICD-10-CM

## 2021-07-17 DIAGNOSIS — E785 Hyperlipidemia, unspecified: Secondary | ICD-10-CM | POA: Diagnosis not present

## 2021-07-17 DIAGNOSIS — Z794 Long term (current) use of insulin: Secondary | ICD-10-CM

## 2021-07-17 LAB — POCT GLYCOSYLATED HEMOGLOBIN (HGB A1C): Hemoglobin A1C: 7.8 % — AB (ref 4.0–5.6)

## 2021-07-17 LAB — POCT GLUCOSE (DEVICE FOR HOME USE): Glucose Fasting, POC: 145 mg/dL — AB (ref 70–99)

## 2021-07-17 MED ORDER — RYBELSUS 14 MG PO TABS: 1.0000 | ORAL_TABLET | Freq: Every day | ORAL | 3 refills | Status: DC

## 2021-07-17 MED ORDER — BASAGLAR KWIKPEN 100 UNIT/ML ~~LOC~~ SOPN: 42.0000 [IU] | PEN_INJECTOR | Freq: Every day | SUBCUTANEOUS | 3 refills | Status: DC

## 2021-07-17 MED ORDER — BD PEN NEEDLE MICRO U/F 32G X 6 MM MISC: 1.0000 | Freq: Every day | 3 refills | Status: DC

## 2021-07-17 NOTE — Progress Notes (Signed)
Name: Sandra Wood  Age/ Sex: 66 y.o., female   MRN/ DOB: [id number], 08-16-1955     PCP: Etta Grandchild, MD   Reason for Endocrinology Evaluation: Type 2 Diabetes Mellitus  Initial Endocrine Consultative Visit: 11/07/2018    PATIENT IDENTIFIER: Sandra Wood is a 66 y.o. female with a past medical history of HTN, T2DM, Asthma, Hx of Breast Ca (2018). The patient has followed with Endocrinology clinic since 11/07/2018 for consultative assistance with management of her diabetes.  DIABETIC HISTORY:  Ms. Dorsainvil was diagnosed with T2DM in 2013. Pt was on diet control until 08/2018 when she was started on Metformin, Rybelsus and Insulin. Her hemoglobin A1c has ranged from 6.6% in 2013, peaking at 12.5% in 2020  On her initial visit to our clinic her A1c was 8.8 % . She was on Rybelsus, tresiba and Metformin  Stopped Metformin 10/2020 due to GI side effects    SUBJECTIVE:   During the last visit (12/25/2020): A1c 7.7% . Increased  basaglar  and continued Rybelsus     Today (07/17/2021): Ms. Hoelting is here for a follow up on her diabetes management.  She has not been checking glucose   She had freestyle libre but cost prohibitive  She was prescribed Jardiance by her PCP but has not started due to cost   Had COVID in 02/2021 No recent diarrhea , no nausea or vomiting    HOME DIABETES REGIMEN:  Basaglar  38 units daily  Rybelsus 14 mg daily  Jardiance 25 mg daily - not taking       DIABETIC COMPLICATIONS: Microvascular complications:  neuropathy Denies: CKD,  Last eye exam: Completed 2021 Macrovascular complications:   Denies: CAD, PVD, CVA      HISTORY:  Past Medical History:  Past Medical History:  Diagnosis Date   Abnormal Pap smear of cervix 2009   Allergic rhinitis, cause unspecified    Arthritis    Arthritis of ankle BILATERAL / HX FX'S   Asthma, mild persistent    Breast cancer (HCC)    Dyspnea    with much activity   Fibroids 2008    GERD (gastroesophageal reflux disease)    not current   History of radiation therapy 01/27/17-03/16/17   left breast 1.8 Gy in 28 fractions, left breast boost 2 Gy in 6 fractions   HSV-2 (herpes simplex virus 2) infection    at age 62   Malignant neoplasm of lower-inner quadrant of left breast in female, estrogen receptor positive (HCC) 08/20/2016   Obesity, morbid (HCC)    Personal history of chemotherapy    Personal history of radiation therapy    Pneumonia    2013, 2017   Tear of medial meniscus of knee LEFT   Type II or unspecified type diabetes mellitus without mention of complication, uncontrolled    Type II- has never been on medication   Past Surgical History:  Past Surgical History:  Procedure Laterality Date   BREAST BIOPSY     BREAST LUMPECTOMY Left 09/22/2016   BREAST LUMPECTOMY WITH RADIOACTIVE SEED AND SENTINEL LYMPH NODE BIOPSY Left 09/22/2016   Procedure: BREAST LUMPECTOMY WITH RADIOACTIVE SEED AND LEFT AXILLARY SENTINEL LYMPH NODE BIOPSY;  Surgeon: Ovidio Kin, MD;  Location: [facility] OR;  Service: General;  Laterality: Left;   CHONDROPLASTY  08/19/2011   Procedure: CHONDROPLASTY;  Surgeon: Drucilla Schmidt, MD;  Location: [facility];  Service: Orthopedics;;  shaving of medial femeral chondral   DILATION AND CURETTAGE OF  UTERUS     HYSTEROSCOPY WITH D & C  2008   benign endometrial polyp - Dr. Maryelizabeth Rowan   KNEE ARTHROSCOPY W/ MENISCECTOMY Bilateral 07/07/2010   1 year apart   MENISECTOMY  08/19/2011   Procedure: MENISECTOMY;  Surgeon: Magnus Sinning, MD;  Location: [facility];  Service: Orthopedics;;  partial lateral menisectomy   PORT-A-CATH REMOVAL N/A 12/21/2017   Procedure: REMOVAL PORT-A-CATH;  Surgeon: Alphonsa Overall, MD;  Location: [place];  Service: General;  Laterality: N/A;   PORTACATH PLACEMENT N/A 09/22/2016   Procedure: INSERTION PORT-A-CATH;  Surgeon: Alphonsa Overall, MD;  Location: [place];  Service: General;  Laterality: N/A;    TONSILLECTOMY     TUBAL LIGATION  AGE 22   Social History:  reports that she quit smoking about 34 years ago. Her smoking use included cigarettes. She has never used smokeless tobacco. She reports that she does not currently use alcohol. She reports that she does not use drugs. Family History:  Family History  Problem Relation Age of Onset   Diabetes Mother    Heart disease Father    Cancer Brother 18       prostate   Breast cancer Maternal Grandmother 88     HOME MEDICATIONS: Allergies as of 07/17/2021       Reactions   Metformin And Related    GI upset    No Known Allergies         Medication List        Accurate as of July 17, 2021  3:13 PM. If you have any questions, ask your nurse or doctor.          anastrozole 1 MG tablet Commonly known as: ARIMIDEX Take 1 tablet (1 mg total) by mouth daily.   Basaglar KwikPen 100 UNIT/ML Inject 42 Units into the skin daily. What changed: how much to take Changed by: Dorita Sciara, MD   BD Pen Needle Micro U/F 32G X 6 MM Misc Generic drug: Insulin Pen Needle 1 Device by Does not apply route daily in the afternoon. What changed:  medication strength how to take this when to take this Changed by: Dorita Sciara, MD   blood glucose meter kit and supplies Kit Dispense based on patient and insurance preference. Use up to four times daily as directed. (FOR ICD-9 250.00, 250.01). Check blood sugar BID.   clotrimazole 1 % cream Commonly known as: Clotrimazole AF Apply 1 application topically 2 (two) times daily. Use for 2 weeks as needed.   Cyanocobalamin 1000 MCG/ML Kit Inject as directed.   empagliflozin 25 MG Tabs tablet Commonly known as: Jardiance Take 1 tablet (25 mg total) by mouth daily before breakfast.   losartan 50 MG tablet Commonly known as: COZAAR TAKE 1 TABLET BY MOUTH EVERY DAY   nystatin powder Commonly known as: MYCOSTATIN/NYSTOP Apply topically 3 (three) times daily. Apply to  affected area for up to 7 days   nystatin-triamcinolone cream Commonly known as: MYCOLOG II Apply 1 application topically 2 (two) times daily. Apply to affected area BID for up to 7 days.   OneTouch Verio test strip Generic drug: glucose blood Use as instructed   rosuvastatin 20 MG tablet Commonly known as: CRESTOR Take 1 tablet (20 mg total) by mouth daily.   Rybelsus 14 MG Tabs Generic drug: Semaglutide Take 1 tablet by mouth daily with breakfast.         OBJECTIVE:   Vital Signs: BP 132/80 (BP Location: Right  Wrist, Patient Position: Sitting)    Pulse 92    Ht $R'5\' 3"'HD$  (1.6 m)    Wt 293 lb 9.6 oz (133.2 kg)    LMP 06/30/2003    SpO2 99%    BMI 52.01 kg/m   Wt Readings from Last 3 Encounters:  07/17/21 293 lb 9.6 oz (133.2 kg)  04/03/21 296 lb (134.3 kg)  03/04/21 293 lb (132.9 kg)     Exam: General: Pt appears well and is in NAD  Lungs: Clear with good BS bilat with no rales, rhonchi, or wheezes  Heart: RRR with normal S1 and S2 and no gallops; no murmurs; no rub  Extremities: No pretibial edema.   Neuro: MS is good with appropriate affect, pt is alert and Ox3       DM foot exam: 07/17/2021    The skin of the feet is intact without sores or ulcerations.Plantar callous formation noted bilaterally The pedal pulses are 2+ on right and 2+ on left. The sensation is decreased  to a screening 5.07, 10 gram monofilament bilaterally at the heels       DATA REVIEWED:  Lab Results  Component Value Date   HGBA1C 7.8 (A) 07/17/2021   HGBA1C 7.9 (H) 04/03/2021   HGBA1C 7.7 (A) 12/25/2020    Latest Reference Range & Units 04/03/21 10:21  Sodium 135 - 145 mEq/L 137  Potassium 3.5 - 5.1 mEq/L 4.3  Chloride 96 - 112 mEq/L 103  CO2 19 - 32 mEq/L 27  Glucose 70 - 99 mg/dL 136 (H)  BUN 6 - 23 mg/dL 9  Creatinine 0.40 - 1.20 mg/dL 0.70  Calcium 8.4 - 10.5 mg/dL 9.7  GFR >60.00 mL/min 90.06     ASSESSMENT / PLAN / RECOMMENDATIONS:   1) Type 2 Diabetes Mellitus,  Sub- Optimally controlled, With Neuropathic complications - Most recent A1c of 7.8 %. Goal A1c < 7.0 %.    - A1c continues to be above goal but stable - She stopped metformin due to GI issues -She was prescribed Jardiance to her PCP in 03/2021 but the patient tells me she has not started this due to cost -Patient with social determinants and is financially helping another family member who has lost her job -She was provided with patient assistance papers for [employer] -We will increase insulin as below -She is at maximum dose for Rybelsus   MEDICATIONS: - Increase  Basaglar 42 Units daily  - Continue Rybelsus 14 mg daily  -Start Jardiance 10 mg daily (if approved through patient assistance)   EDUCATION / INSTRUCTIONS: BG monitoring instructions: Patient is instructed to check her blood sugars 2 times a day, fasting and bedtime. Call [place] [facility] clinic if: BG persistently < 70 I reviewed the Rule of 15 for the treatment of hypoglycemia in detail with the patient. Literature supplied.   2) Dyslipidemia :  -LDL has been at goal  Medication Continue rosuvastatin 20 mg daily   F/U in 4 months    Signed electronically by: Mack Guise, MD  [facility] Endocrinology  [facility] [address] [place], [place] [address] Phone: [phone] FAX: [phone]   CC: Janith Lima, MD [place] [address] Phone: [phone]  Fax: [phone]  Return to Endocrinology clinic as below: Future Appointments  Date Time Provider [place]  08/05/2021  9:00 AM GI-[facility] DX DEXA 1 [facility] GI-BREAST CE  08/11/2021  9:00 AM [facility] NURSE [facility] None  01/15/2022  9:30 AM  Jarl Sellitto, Melanie Crazier, MD [facility] None  03/03/2022  9:30 AM Nicholas Lose, MD [facility] None

## 2021-07-17 NOTE — Patient Instructions (Addendum)
-   Increase Basaglar 42 Units daily  - Continue Rybelsus 14 mg, 1 tablet daily  - Fill out forms for Jardiance please and bring it back to [place]     HOW TO TREAT LOW BLOOD SUGARS (Blood sugar LESS THAN 70 MG/DL) Please follow the RULE OF 15 for the treatment of hypoglycemia treatment (when your (blood sugars are less than 70 mg/dL)   STEP 1: Take 15 grams of carbohydrates when your blood sugar is low, which includes:  3-4 GLUCOSE TABS  OR 3-4 OZ OF JUICE OR REGULAR SODA OR ONE TUBE OF GLUCOSE GEL    STEP 2: RECHECK blood sugar in 15 MINUTES STEP 3: If your blood sugar is still low at the 15 minute recheck --> then, go back to STEP 1 and treat AGAIN with another 15 grams of carbohydrates.

## 2021-08-05 ENCOUNTER — Other Ambulatory Visit: Payer: Self-pay | Admitting: Hematology and Oncology

## 2021-08-05 ENCOUNTER — Encounter: Payer: Self-pay | Admitting: Hematology and Oncology

## 2021-08-05 ENCOUNTER — Ambulatory Visit
Admission: RE | Admit: 2021-08-05 | Discharge: 2021-08-05 | Disposition: A | Discharge status: Home / Self Care | Payer: Medicare Other | Source: Ambulatory Visit | Attending: Hematology and Oncology | Admitting: Hematology and Oncology

## 2021-08-05 DIAGNOSIS — Z780 Asymptomatic menopausal state: Secondary | ICD-10-CM

## 2021-08-05 DIAGNOSIS — Z1231 Encounter for screening mammogram for malignant neoplasm of breast: Secondary | ICD-10-CM

## 2021-08-11 ENCOUNTER — Ambulatory Visit (INDEPENDENT_AMBULATORY_CARE_PROVIDER_SITE_OTHER): Payer: Medicare Other

## 2021-08-11 ENCOUNTER — Other Ambulatory Visit: Payer: Self-pay

## 2021-08-11 DIAGNOSIS — G63 Polyneuropathy in diseases classified elsewhere: Secondary | ICD-10-CM | POA: Diagnosis not present

## 2021-08-11 DIAGNOSIS — E538 Deficiency of other specified B group vitamins: Secondary | ICD-10-CM

## 2021-08-11 MED ORDER — CYANOCOBALAMIN 1000 MCG/ML IJ SOLN: 1000.0000 ug | Freq: Once | INTRAMUSCULAR | 0 refills | Status: DC

## 2021-08-11 NOTE — Progress Notes (Signed)
Pt here for monthly B12 injection per Dr.Jones.  B12 1062mcg given IM, and pt tolerated injection well.  Next B12 injection scheduled for 09/10/21.

## 2021-09-10 ENCOUNTER — Other Ambulatory Visit: Payer: Self-pay | Admitting: Internal Medicine

## 2021-09-10 ENCOUNTER — Other Ambulatory Visit: Payer: Self-pay

## 2021-09-10 ENCOUNTER — Ambulatory Visit (INDEPENDENT_AMBULATORY_CARE_PROVIDER_SITE_OTHER): Payer: Medicare Other

## 2021-09-10 DIAGNOSIS — G63 Polyneuropathy in diseases classified elsewhere: Secondary | ICD-10-CM

## 2021-09-10 DIAGNOSIS — E538 Deficiency of other specified B group vitamins: Secondary | ICD-10-CM

## 2021-09-10 MED ORDER — CYANOCOBALAMIN 1000 MCG/ML IJ SOLN
1000.0000 ug | Freq: Once | INTRAMUSCULAR | Status: AC
  Administered 2021-09-10: 1000 ug via INTRAMUSCULAR

## 2021-09-10 NOTE — Progress Notes (Signed)
Pt here for monthly B12 injection per Dr. Ronnald Ramp ? ?B12 1062mg given IM, and pt tolerated injection well. ? ?Next B12 injection scheduled for 10/15/21 ?

## 2021-09-12 ENCOUNTER — Other Ambulatory Visit: Payer: Self-pay | Admitting: Internal Medicine

## 2021-10-15 ENCOUNTER — Ambulatory Visit (INDEPENDENT_AMBULATORY_CARE_PROVIDER_SITE_OTHER): Payer: Medicare Other

## 2021-10-15 ENCOUNTER — Ambulatory Visit (INDEPENDENT_AMBULATORY_CARE_PROVIDER_SITE_OTHER): Payer: Medicare Other | Admitting: *Deleted

## 2021-10-15 DIAGNOSIS — Z0000 Encounter for general adult medical examination without abnormal findings: Secondary | ICD-10-CM | POA: Diagnosis not present

## 2021-10-15 DIAGNOSIS — E538 Deficiency of other specified B group vitamins: Secondary | ICD-10-CM | POA: Diagnosis not present

## 2021-10-15 DIAGNOSIS — G63 Polyneuropathy in diseases classified elsewhere: Secondary | ICD-10-CM

## 2021-10-15 MED ORDER — CYANOCOBALAMIN 1000 MCG/ML IJ SOLN
1000.0000 ug | Freq: Once | INTRAMUSCULAR | Status: AC
  Administered 2021-10-15: 1000 ug via INTRAMUSCULAR

## 2021-10-15 NOTE — Patient Instructions (Signed)
Sandra Wood , ?Thank you for taking time to come for your Medicare Wellness Visit. I appreciate your ongoing commitment to your health goals. Please review the following plan we discussed and let me know if I can assist you in the future.  ? ?Screening recommendations/referrals: ?Colonoscopy: Never done ?Mammogram: 02/13/2021; scheduled for 01/27/2022 ?Bone Density: 08/05/2021; due every 2 years ?Recommended yearly ophthalmology/optometry visit for glaucoma screening and checkup ?Recommended yearly dental visit for hygiene and checkup ? ?Vaccinations: ?Influenza vaccine: 04/03/2021 ?Pneumococcal vaccine: 09/13/2018, 11/20/2019 ?Tdap vaccine: 03/31/2012; due every 10 years ?Shingles vaccine: 01/25/2019, 05/22/2019   ?Covid-19: 10/06/2019, 11/03/2019 ? ?Advanced directives: No; Advance directive discussed with you today. Even though you declined this today please call our office should you change your mind and we can give you the proper paperwork for you to fill out. ? ?Conditions/risks identified: Yes ? ?Next appointment: Please schedule your next Medicare Wellness Visit with your Nurse Health Advisor in 1 year by calling [phone]. ? ? ?Preventive Care 31 Years and Older, Female ?Preventive care refers to lifestyle choices and visits with your health care provider that can promote health and wellness. ?What does preventive care include? ?A yearly physical exam. This is also called an annual well check. ?Dental exams once or twice a year. ?Routine eye exams. Ask your health care provider how often you should have your eyes checked. ?Personal lifestyle choices, including: ?Daily care of your teeth and gums. ?Regular physical activity. ?Eating a healthy diet. ?Avoiding tobacco and drug use. ?Limiting alcohol use. ?Practicing safe sex. ?Taking low-dose aspirin every day. ?Taking vitamin and mineral supplements as recommended by your health care provider. ?What happens during an annual well check? ?The services and screenings done  by your health care provider during your annual well check will depend on your age, overall health, lifestyle risk factors, and family history of disease. ?Counseling  ?Your health care provider may ask you questions about your: ?Alcohol use. ?Tobacco use. ?Drug use. ?Emotional well-being. ?Home and relationship well-being. ?Sexual activity. ?Eating habits. ?History of falls. ?Memory and ability to understand (cognition). ?Work and work Statistician. ?Reproductive health. ?Screening  ?You may have the following tests or measurements: ?Height, weight, and BMI. ?Blood pressure. ?Lipid and cholesterol levels. These may be checked every 5 years, or more frequently if you are over 15 years old. ?Skin check. ?Lung cancer screening. You may have this screening every year starting at age 18 if you have a 30-pack-year history of smoking and currently smoke or have quit within the past 15 years. ?Fecal occult blood test (FOBT) of the stool. You may have this test every year starting at age 74. ?Flexible sigmoidoscopy or colonoscopy. You may have a sigmoidoscopy every 5 years or a colonoscopy every 10 years starting at age 80. ?Hepatitis C blood test. ?Hepatitis B blood test. ?Sexually transmitted disease (STD) testing. ?Diabetes screening. This is done by checking your blood sugar (glucose) after you have not eaten for a while (fasting). You may have this done every 1-3 years. ?Bone density scan. This is done to screen for osteoporosis. You may have this done starting at age 12. ?Mammogram. This may be done every 1-2 years. Talk to your health care provider about how often you should have regular mammograms. ?Talk with your health care provider about your test results, treatment options, and if necessary, the need for more tests. ?Vaccines  ?Your health care provider may recommend certain vaccines, such as: ?Influenza vaccine. This is recommended every year. ?Tetanus, diphtheria, and acellular  pertussis (Tdap, Td) vaccine. You  may need a Td booster every 10 years. ?Zoster vaccine. You may need this after age 75. ?Pneumococcal 13-valent conjugate (PCV13) vaccine. One dose is recommended after age 54. ?Pneumococcal polysaccharide (PPSV23) vaccine. One dose is recommended after age 6. ?Talk to your health care provider about which screenings and vaccines you need and how often you need them. ?This information is not intended to replace advice given to you by your health care provider. Make sure you discuss any questions you have with your health care provider. ?Document Released: 07/12/2015 Document Revised: 03/04/2016 Document Reviewed: 04/16/2015 ?Elsevier Interactive Patient Education ? 2017 [place]. ? ?Fall Prevention in the Home ?Falls can cause injuries. They can happen to people of all ages. There are many things you can do to make your home safe and to help prevent falls. ?What can I do on the outside of my home? ?Regularly fix the edges of walkways and driveways and fix any cracks. ?Remove anything that might make you trip as you walk through a door, such as a raised step or threshold. ?Trim any bushes or trees on the path to your home. ?Use bright outdoor lighting. ?Clear any walking paths of anything that might make someone trip, such as rocks or tools. ?Regularly check to see if handrails are loose or broken. Make sure that both sides of any steps have handrails. ?Any raised decks and porches should have guardrails on the edges. ?Have any leaves, snow, or ice cleared regularly. ?Use sand or salt on walking paths during winter. ?Clean up any spills in your garage right away. This includes oil or grease spills. ?What can I do in the bathroom? ?Use night lights. ?Install grab bars by the toilet and in the tub and shower. Do not use towel bars as grab bars. ?Use non-skid mats or decals in the tub or shower. ?If you need to sit down in the shower, use a plastic, non-slip stool. ?Keep the floor dry. Clean up any water that spills  on the floor as soon as it happens. ?Remove soap buildup in the tub or shower regularly. ?Attach bath mats securely with double-sided non-slip rug tape. ?Do not have throw rugs and other things on the floor that can make you trip. ?What can I do in the bedroom? ?Use night lights. ?Make sure that you have a light by your bed that is easy to reach. ?Do not use any sheets or blankets that are too big for your bed. They should not hang down onto the floor. ?Have a firm chair that has side arms. You can use this for support while you get dressed. ?Do not have throw rugs and other things on the floor that can make you trip. ?What can I do in the kitchen? ?Clean up any spills right away. ?Avoid walking on wet floors. ?Keep items that you use a lot in easy-to-reach places. ?If you need to reach something above you, use a strong step stool that has a grab bar. ?Keep electrical cords out of the way. ?Do not use floor polish or wax that makes floors slippery. If you must use wax, use non-skid floor wax. ?Do not have throw rugs and other things on the floor that can make you trip. ?What can I do with my stairs? ?Do not leave any items on the stairs. ?Make sure that there are handrails on both sides of the stairs and use them. Fix handrails that are broken or loose. Make sure that handrails  are as long as the stairways. ?Check any carpeting to make sure that it is firmly attached to the stairs. Fix any carpet that is loose or worn. ?Avoid having throw rugs at the top or bottom of the stairs. If you do have throw rugs, attach them to the floor with carpet tape. ?Make sure that you have a light switch at the top of the stairs and the bottom of the stairs. If you do not have them, ask someone to add them for you. ?What else can I do to help prevent falls? ?Wear shoes that: ?Do not have high heels. ?Have rubber bottoms. ?Are comfortable and fit you well. ?Are closed at the toe. Do not wear sandals. ?If you use a stepladder: ?Make  sure that it is fully opened. Do not climb a closed stepladder. ?Make sure that both sides of the stepladder are locked into place. ?Ask someone to hold it for you, if possible. ?Clearly mark and mak

## 2021-10-15 NOTE — Progress Notes (Signed)
Pls cosign for B12 inj../lmb  

## 2021-10-15 NOTE — Progress Notes (Signed)
? ?Subjective:  ? Sandra Wood is a 66 y.o. female who presents for an Initial Medicare Annual Wellness Visit. ? ?Review of Systems    ? ?Cardiac Risk Factors include: advanced age (>26men, >47 women);diabetes mellitus;dyslipidemia;family history of premature cardiovascular disease;hypertension;obesity (BMI >30kg/m2) ? ?   ?Objective:  ?  ?There were no vitals filed for this visit. ?There is no height or weight on file to calculate BMI. ? ? ?  10/15/2021  ? 10:04 AM 03/04/2021  ?  9:11 AM 11/15/2018  ?  3:34 PM 04/12/2017  ? 11:31 AM 02/15/2017  ? 11:01 AM 01/11/2017  ?  9:03 AM 12/07/2016  ?  1:43 PM  ?Advanced Directives  ?Does Patient Have a Medical Advance Directive? No No No No No No Yes  ?Would patient like information on creating a medical advance directive? No - Patient declined No - Patient declined No - Patient declined   No - Patient declined   ? ? ?Current Medications (verified) ?Outpatient Encounter Medications as of 10/15/2021  ?Medication Sig  ? anastrozole (ARIMIDEX) 1 MG tablet Take 1 tablet (1 mg total) by mouth daily.  ? blood glucose meter kit and supplies KIT Dispense based on patient and insurance preference. Use up to four times daily as directed. (FOR ICD-9 250.00, 250.01). Check blood sugar BID.  ? clotrimazole (CLOTRIMAZOLE AF) 1 % cream Apply 1 application topically 2 (two) times daily. Use for 2 weeks as needed.  ? cyanocobalamin (,VITAMIN B-12,) 1000 MCG/ML injection INJECT 1 ML (1,000 MCG TOTAL) INTO THE MUSCLE ONCE FOR 1 DOSE.  ? glucose blood (ONETOUCH VERIO) test strip Use as instructed  ? Insulin Glargine (BASAGLAR KWIKPEN) 100 UNIT/ML Inject 42 Units into the skin daily.  ? Insulin Pen Needle (BD PEN NEEDLE MICRO U/F) 32G X 6 MM MISC 1 Device by Does not apply route daily in the afternoon.  ? losartan (COZAAR) 50 MG tablet TAKE 1 TABLET BY MOUTH EVERY DAY  ? nystatin (MYCOSTATIN/NYSTOP) powder Apply topically 3 (three) times daily. Apply to affected area for up to 7 days  ?  rosuvastatin (CRESTOR) 20 MG tablet Take 1 tablet (20 mg total) by mouth daily.  ? Semaglutide (RYBELSUS) 14 MG TABS Take 1 tablet by mouth daily with breakfast.  ? [DISCONTINUED] nystatin-triamcinolone (MYCOLOG II) cream Apply 1 application topically 2 (two) times daily. Apply to affected area BID for up to 7 days.  ? [DISCONTINUED] prochlorperazine (COMPAZINE) 10 MG tablet Take 1 tablet (10 mg total) by mouth every 6 (six) hours as needed (Nausea or vomiting). (Patient not taking: Reported on 01/11/2017)  ? [EXPIRED] cyanocobalamin ((VITAMIN B-12)) injection 1,000 mcg   ? ?No facility-administered encounter medications on file as of 10/15/2021.  ? ? ?Allergies (verified) ?Metformin and related and No known allergies  ? ?History: ?Past Medical History:  ?Diagnosis Date  ? Abnormal Pap smear of cervix 2009  ? Allergic rhinitis, cause unspecified   ? Arthritis   ? Arthritis of ankle BILATERAL / HX FX'S  ? Asthma, mild persistent   ? Breast cancer ([place])   ? Dyspnea   ? with much activity  ? Fibroids 2008  ? GERD (gastroesophageal reflux disease)   ? not current  ? History of radiation therapy 01/27/17-03/16/17  ? left breast 1.8 Gy in 28 fractions, left breast boost 2 Gy in 6 fractions  ? HSV-2 (herpes simplex virus 2) infection   ? at age 46  ? Malignant neoplasm of lower-inner quadrant of left breast in female,  estrogen receptor positive ([place]) 08/20/2016  ? Obesity, morbid ([place])   ? Personal history of chemotherapy   ? Personal history of radiation therapy   ? Pneumonia   ? 2013, 2017  ? Tear of medial meniscus of knee LEFT  ? Type II or unspecified type diabetes mellitus without mention of complication, uncontrolled   ? Type II- has never been on medication  ? ?Past Surgical History:  ?Procedure Laterality Date  ? BREAST BIOPSY    ? BREAST LUMPECTOMY Left 09/22/2016  ? BREAST LUMPECTOMY WITH RADIOACTIVE SEED AND SENTINEL LYMPH NODE BIOPSY Left 09/22/2016  ? Procedure: BREAST LUMPECTOMY WITH RADIOACTIVE SEED AND LEFT  AXILLARY SENTINEL LYMPH NODE BIOPSY;  Surgeon: Alphonsa Overall, MD;  Location: [facility];  Service: General;  Laterality: Left;  ? CHONDROPLASTY  08/19/2011  ? Procedure: CHONDROPLASTY;  Surgeon: Magnus Sinning, MD;  Location: [facility];  Service: Orthopedics;;  shaving of medial femeral chondral  ? DILATION AND CURETTAGE OF UTERUS    ? HYSTEROSCOPY WITH D & C  2008  ? benign endometrial polyp - Dr. Maryelizabeth Rowan  ? KNEE ARTHROSCOPY W/ MENISCECTOMY Bilateral 07/07/2010  ? 1 year apart  ? MENISECTOMY  08/19/2011  ? Procedure: MENISECTOMY;  Surgeon: Magnus Sinning, MD;  [facility];  Service: Orthopedics;;  partial lateral menisectomy  ? PORT-A-CATH REMOVAL N/A 12/21/2017  ? Procedure: REMOVAL PORT-A-CATH;  Surgeon: Alphonsa Overall, MD;  Location: [facility];  Service: General;  Laterality: N/A;  ? PORTACATH PLACEMENT N/A 09/22/2016  ? Procedure: INSERTION PORT-A-CATH;  Surgeon: Alphonsa Overall, MD;  Location: [facility];  Service: General;  Laterality: N/A;  ? TONSILLECTOMY    ? TUBAL LIGATION  AGE 32  ? ?Family History  ?Problem Relation Age of Onset  ? Diabetes Mother   ? Heart disease Father   ? Cancer Brother 70  ?     prostate  ? Breast cancer Maternal Grandmother 58  ? ?Social History  ? ?Socioeconomic History  ? Marital status: Divorced  ?  Spouse name: Not on file  ? Number of children: Not on file  ? Years of education: Not on file  ? Highest education level: Not on file  ?Occupational History  ? Occupation: retired  ?Tobacco Use  ? Smoking status: Former  ?  Years: 5.00  ?  Types: Cigarettes  ?  Quit date: 08/13/1986  ?  Years since quitting: 35.1  ? Smokeless tobacco: Never  ?Vaping Use  ? Vaping Use: Never used  ?Substance and Sexual Activity  ? Alcohol use: Not Currently  ? Drug use: No  ? Sexual activity: Not Currently  ?  Birth control/protection: Post-menopausal, Abstinence  ?Other Topics Concern  ? Not on file  ?Social History Narrative  ? Not on file  ? ?Social Determinants of  Health  ? ?Financial Resource Strain: Low Risk   ? Difficulty of Paying Living Expenses: Not hard at all  ?Food Insecurity: No Food Insecurity  ? Worried About Charity fundraiser in the Last Year: Never true  ? Ran Out of Food in the Last Year: Never true  ?Transportation Needs: No Transportation Needs  ? Lack of Transportation (Medical): No  ? Lack of Transportation (Non-Medical): No  ?Physical Activity: Sufficiently Active  ? Days of Exercise per Week: 5 days  ? Minutes of Exercise per Session: 30 min  ?Stress: No Stress Concern Present  ? Feeling of Stress : Not at all  ?Social Connections: Moderately Integrated  ?  Frequency of Communication with Friends and Family: More than three times a week  ? Frequency of Social Gatherings with Friends and Family: More than three times a week  ? Attends Religious Services: 1 to 4 times per year  ? Active Member of Clubs or Organizations: Yes  ? Attends Archivist Meetings: 1 to 4 times per year  ? Marital Status: Divorced  ? ? ?Tobacco Counseling ?Counseling given: Not Answered ? ? ?Clinical Intake: ? ?Pre-visit preparation completed: Yes ? ?Pain : No/denies pain ? ?  ? ?Nutritional Risks: None ?Diabetes: Yes ?CBG done?: No ?Did pt. bring in CBG monitor from home?: No ? ?How often do you need to have someone help you when you read instructions, pamphlets, or other written materials from your doctor or pharmacy?: 1 - Never ?What is the last grade level you completed in school?: 2 years of college ? ?Diabetic? yes ? ?Interpreter Needed?: No ? ?Information entered by :: Lisette Abu, LPN ? ? ?Activities of Daily Living ? ?  10/15/2021  ? 10:05 AM 04/03/2021  ?  9:51 AM  ?In your present state of health, do you have any difficulty performing the following activities:  ?Hearing? 0 0  ?Vision? 0 0  ?Difficulty concentrating or making decisions? 0 0  ?Walking or climbing stairs? 0 0  ?Dressing or bathing? 0 0  ?Doing errands, shopping? 0 0  ?Preparing Food and eating ?  N   ?Using the Toilet? N   ?In the past six months, have you accidently leaked urine? N   ?Do you have problems with loss of bowel control? N   ?Managing your Medications? N   ?Managing your Finances? N   ?Housekeepin

## 2021-11-19 ENCOUNTER — Ambulatory Visit (INDEPENDENT_AMBULATORY_CARE_PROVIDER_SITE_OTHER): Payer: Medicare Other

## 2021-11-19 DIAGNOSIS — E538 Deficiency of other specified B group vitamins: Secondary | ICD-10-CM | POA: Diagnosis not present

## 2021-11-19 DIAGNOSIS — G63 Polyneuropathy in diseases classified elsewhere: Secondary | ICD-10-CM | POA: Diagnosis not present

## 2021-11-19 MED ORDER — CYANOCOBALAMIN 1000 MCG/ML IJ SOLN
1000.0000 ug | Freq: Once | INTRAMUSCULAR | Status: AC
  Administered 2021-11-19: 1000 ug via INTRAMUSCULAR

## 2021-11-19 NOTE — Progress Notes (Signed)
Administered monthly B12 injection as ordered by PCP. 1000 mcg/ml injected to left deltoid, patient tolerated it well with no discomfort reported.  Next appointment has already been scheduled.

## 2021-12-15 ENCOUNTER — Other Ambulatory Visit: Payer: Self-pay | Admitting: Internal Medicine

## 2021-12-15 DIAGNOSIS — I10 Essential (primary) hypertension: Secondary | ICD-10-CM

## 2021-12-19 ENCOUNTER — Other Ambulatory Visit: Payer: Self-pay | Admitting: Internal Medicine

## 2021-12-19 DIAGNOSIS — I10 Essential (primary) hypertension: Secondary | ICD-10-CM

## 2021-12-19 DIAGNOSIS — IMO0002 Reserved for concepts with insufficient information to code with codable children: Secondary | ICD-10-CM

## 2021-12-22 ENCOUNTER — Encounter: Payer: Self-pay | Admitting: Internal Medicine

## 2021-12-22 ENCOUNTER — Telehealth: Payer: Self-pay | Admitting: Internal Medicine

## 2021-12-24 ENCOUNTER — Encounter: Payer: Self-pay | Admitting: Family Medicine

## 2021-12-24 ENCOUNTER — Ambulatory Visit: Payer: Medicare Other

## 2021-12-24 ENCOUNTER — Ambulatory Visit (INDEPENDENT_AMBULATORY_CARE_PROVIDER_SITE_OTHER): Payer: Medicare Other | Admitting: Family Medicine

## 2021-12-24 VITALS — BP 132/84 | HR 93 | Temp 97.6°F | Ht 63.0 in | Wt 283.0 lb

## 2021-12-24 DIAGNOSIS — I10 Essential (primary) hypertension: Secondary | ICD-10-CM | POA: Diagnosis not present

## 2021-12-24 DIAGNOSIS — G63 Polyneuropathy in diseases classified elsewhere: Secondary | ICD-10-CM

## 2021-12-24 DIAGNOSIS — E538 Deficiency of other specified B group vitamins: Secondary | ICD-10-CM | POA: Diagnosis not present

## 2021-12-24 LAB — BASIC METABOLIC PANEL
BUN: 16 mg/dL (ref 6–23)
CO2: 26 mEq/L (ref 19–32)
Calcium: 9.8 mg/dL (ref 8.4–10.5)
Chloride: 102 mEq/L (ref 96–112)
Creatinine, Ser: 0.67 mg/dL (ref 0.40–1.20)
GFR: 90.56 mL/min (ref >60.00)
Glucose, Bld: 195 mg/dL — ABNORMAL HIGH (ref 70–99)
Potassium: 4.1 mEq/L (ref 3.5–5.1)
Sodium: 138 mEq/L (ref 135–145)

## 2021-12-24 MED ORDER — CYANOCOBALAMIN 1000 MCG/ML IJ SOLN
1000.0000 ug | Freq: Once | INTRAMUSCULAR | Status: AC
  Administered 2021-12-24: 1000 ug via INTRAMUSCULAR

## 2021-12-24 MED ORDER — LOSARTAN POTASSIUM 50 MG PO TABS: 50.0000 mg | ORAL_TABLET | Freq: Every day | ORAL | 0 refills | Status: DC

## 2021-12-24 NOTE — Progress Notes (Signed)
Pt here for monthly B12 injection per Dr jones   B12 1000mcg given IM, and pt tolerated injection well.   

## 2021-12-24 NOTE — Progress Notes (Signed)
Subjective:     Patient ID: Sandra Wood, female    DOB: August 31, 1955, 66 y.o.   MRN: [id number]  Chief Complaint  Patient presents with   Medication Refill    Losartan refill    HPI Patient is in today for HTN follow up. States she typically takes losartan 50 mg daily but has been out of the medication for the past week.  Does not check BP at home.   Denies fever, chills, dizziness, chest pain, palpitations, shortness of breath, abdominal pain, N/V/D, urinary symptoms, LE edema.      Health Maintenance Due  Topic Date Due   COLONOSCOPY (Pts 45-8yr Insurance coverage will need to be confirmed)  Never done   OPHTHALMOLOGY EXAM  12/09/2019    Past Medical History:  Diagnosis Date   Abnormal Pap smear of cervix 2009   Allergic rhinitis, cause unspecified    Arthritis    Arthritis of ankle BILATERAL / HX FX'S   Asthma, mild persistent    Breast cancer ([facility]    Dyspnea    with much activity   Fibroids 2008   GERD (gastroesophageal reflux disease)    not current   History of radiation therapy 01/27/17-03/16/17   left breast 1.8 Gy in 28 fractions, left breast boost 2 Gy in 6 fractions   HSV-2 (herpes simplex virus 2) infection    at age 89  Malignant neoplasm of lower-inner quadrant of left breast in female, estrogen receptor positive ([facility] 08/20/2016   Obesity, morbid ([facility]    Personal history of chemotherapy    Personal history of radiation therapy    Pneumonia    2013, 2017   Tear of medial meniscus of knee LEFT   Type II or unspecified type diabetes mellitus without mention of complication, uncontrolled    Type II- has never been on medication    Past Surgical History:  Procedure Laterality Date   BREAST BIOPSY     BREAST LUMPECTOMY Left 09/22/2016   BREAST LUMPECTOMY WITH RADIOACTIVE SEED AND SENTINEL LYMPH NODE BIOPSY Left 09/22/2016   Procedure: BREAST LUMPECTOMY WITH RADIOACTIVE SEED AND LEFT AXILLARY SENTINEL LYMPH NODE BIOPSY;  Surgeon: DAlphonsa Overall  MD;  [place] [facility] [facility]  Service: General;  Laterality: Left;   CHONDROPLASTY  08/19/2011   Procedure: CHONDROPLASTY;  Surgeon: JMagnus Sinning MD;  [place] [facility]  Service: Orthopedics;;  shaving of medial femeral chondral   DILATION AND CURETTAGE OF UTERUS     HYSTEROSCOPY WITH D & C  2008   benign endometrial polyp - Dr. WMaryelizabeth Rowan  KNEE ARTHROSCOPY W/ MENISCECTOMY Bilateral 07/07/2010   1 year apart   MENISECTOMY  08/19/2011   Procedure: MENISECTOMY;  Surgeon: JMagnus Sinning MD;  [place] [facility]  Service: Orthopedics;;  partial lateral menisectomy   PORT-A-CATH REMOVAL N/A 12/21/2017   Procedure: REMOVAL PORT-A-CATH;  Surgeon: NAlphonsa Overall MD;  [place] [facility]  Service: General;  Laterality: N/A;   PORTACATH PLACEMENT N/A 09/22/2016   Procedure: INSERTION PORT-A-CATH;  Surgeon: DAlphonsa Overall MD;  [facility]  Service: General;  Laterality: N/A;   TONSILLECTOMY     TUBAL LIGATION  AGE 66   Family History  Problem Relation Age of Onset   Diabetes Mother    Heart disease Father    Cancer Brother 527      prostate   Breast cancer Maternal Grandmother 88    Social History   Socioeconomic History  Marital status: Divorced    Spouse name: Not on file   Number of children: Not on file   Years of education: Not on file   Highest education level: Not on file  Occupational History   Occupation: retired  Tobacco Use   Smoking status: Former    Years: 5.00    Types: Cigarettes    Quit date: 08/13/1986    Years since quitting: 35.3   Smokeless tobacco: Never  Vaping Use   Vaping Use: Never used  Substance and Sexual Activity   Alcohol use: Not Currently   Drug use: No   Sexual activity: Not Currently    Birth control/protection: Post-menopausal, Abstinence  Other Topics Concern   Not on file  Social History Narrative   Not on file   Social Determinants of Health   Financial Resource Strain: Low Risk  (10/15/2021)    Overall Financial Resource Strain (CARDIA)    Difficulty of Paying Living Expenses: Not hard at all  Food Insecurity: No Food Insecurity (10/15/2021)   Hunger Vital Sign    Worried About Running Out of Food in the Last Year: Never true    Ran Out of Food in the Last Year: Never true  Transportation Needs: No Transportation Needs (10/15/2021)   PRAPARE - Transportation    Lack of Transportation (Medical): No    Lack of Transportation (Non-Medical): No  Physical Activity: Sufficiently Active (10/15/2021)   Exercise Vital Sign    Days of Exercise per Week: 5 days    Minutes of Exercise per Session: 30 min  Stress: No Stress Concern Present (10/15/2021)   [employer] - Occupational Stress Questionnaire    Feeling of Stress : Not at all  Social Connections: Moderately Integrated (10/15/2021)   Social Connection and Isolation Panel [NHANES]    Frequency of Communication with Friends and Family: More than three times a week    Frequency of Social Gatherings with Friends and Family: More than three times a week    Attends Religious Services: 1 to 4 times per year    Active Member of Clubs or Organizations: Yes    Attends Club or Organization Meetings: 1 to 4 times per year    Marital Status: Divorced  Intimate Partner Violence: Not At Risk (10/15/2021)   Humiliation, Afraid, Rape, and Kick questionnaire    Fear of Current or Ex-Partner: No    Emotionally Abused: No    Physically Abused: No    Sexually Abused: No    Outpatient Medications Prior to Visit  Medication Sig Dispense Refill   anastrozole (ARIMIDEX) 1 MG tablet Take 1 tablet (1 mg total) by mouth daily. 90 tablet 3   blood glucose meter kit and supplies KIT Dispense based on patient and insurance preference. Use up to four times daily as directed. (FOR ICD-9 250.00, 250.01). Check blood sugar BID. 1 each 0   clotrimazole (CLOTRIMAZOLE AF) 1 % cream Apply 1 application topically 2 (two) times daily. Use for  2 weeks as needed. 30 g 1   cyanocobalamin (,VITAMIN B-12,) 1000 MCG/ML injection INJECT 1 ML (1,000 MCG TOTAL) INTO THE MUSCLE ONCE FOR 1 DOSE. 1 mL 1   glucose blood (ONETOUCH VERIO) test strip Use as instructed 100 each 12   Insulin Glargine (BASAGLAR KWIKPEN) 100 UNIT/ML Inject 42 Units into the skin daily. 45 mL 3   Insulin Pen Needle (BD PEN NEEDLE MICRO U/F) 32G X 6 MM MISC 1 Device by Does not apply route daily   in the afternoon. 100 each 3   nystatin (MYCOSTATIN/NYSTOP) powder Apply topically 3 (three) times daily. Apply to affected area for up to 7 days 45 g 1   rosuvastatin (CRESTOR) 20 MG tablet Take 1 tablet (20 mg total) by mouth daily. 90 tablet 3   Semaglutide (RYBELSUS) 14 MG TABS Take 1 tablet by mouth daily with breakfast. 90 tablet 3   losartan (COZAAR) 50 MG tablet TAKE 1 TABLET BY MOUTH EVERY DAY 90 tablet 0   No facility-administered medications prior to visit.    Allergies  Allergen Reactions   Metformin And Related     GI upset    No Known Allergies     ROS Pertinent positives and negatives in the history of present illness.     Objective:    Physical Exam  BP 132/84 (BP Location: Right Arm, Patient Position: Sitting, Cuff Size: Large)   Pulse 93   Temp 97.6 F (36.4 C) (Temporal)   Ht 5' 3" (1.6 m)   Wt 283 lb (128.4 kg)   LMP 06/30/2003   SpO2 94%   BMI 50.13 kg/m  Wt Readings from Last 3 Encounters:  12/24/21 283 lb (128.4 kg)  07/17/21 293 lb 9.6 oz (133.2 kg)  04/03/21 296 lb (134.3 kg)   Alert and in no distress.  Cardiac exam shows a regular rate and rhythm.  Lungs are clear to auscultation. Extremities without edema. Skin is warm and dry. Normal mood.       Assessment & Plan:   Problem List Items Addressed This Visit       Cardiovascular and Mediastinum   Essential hypertension - Primary    Refilled losartan 50 mg. BP close to goal. Check CMP and follow up.       Relevant Medications   losartan (COZAAR) 50 MG tablet   Other  Relevant Orders   Basic metabolic panel (Completed)     Nervous and Auditory   Vitamin B12 deficiency neuropathy (HCC)    I have changed Ilina L. Mccubbin "Cindy"'s losartan. I am also having her maintain her blood glucose meter kit and supplies, OneTouch Verio, rosuvastatin, anastrozole, nystatin, clotrimazole, Rybelsus, Basaglar KwikPen, BD Pen Needle Micro U/F, and cyanocobalamin. We administered cyanocobalamin.  Meds ordered this encounter  Medications   cyanocobalamin ((VITAMIN B-12)) injection 1,000 mcg   losartan (COZAAR) 50 MG tablet    Sig: Take 1 tablet (50 mg total) by mouth daily.    Dispense:  90 tablet    Refill:  0    Order Specific Question:   Supervising Provider    Answer:   Schildt, ELIZABETH A [[id number]]    

## 2021-12-24 NOTE — Patient Instructions (Signed)
Please go to the lab on the first floor before you leave today.   I refilled your medication, losartan.   Follow up with Dr. Ronnald Ramp in October for your medications and chronic health conditions.    DASH Eating Plan DASH stands for Dietary Approaches to Stop Hypertension. The DASH eating plan is a healthy eating plan that has been shown to: Reduce high blood pressure (hypertension). Reduce your risk for type 2 diabetes, heart disease, and stroke. Help with weight loss. What are tips for following this plan? Reading food labels Check food labels for the amount of salt (sodium) per serving. Choose foods with less than 5 percent of the Daily Value of sodium. Generally, foods with less than 300 milligrams (mg) of sodium per serving fit into this eating plan. To find whole grains, look for the word "whole" as the first word in the ingredient list. Shopping Buy products labeled as "low-sodium" or "no salt added." Buy fresh foods. Avoid canned foods and pre-made or frozen meals. Cooking Avoid adding salt when cooking. Use salt-free seasonings or herbs instead of table salt or sea salt. Check with your health care provider or pharmacist before using salt substitutes. Do not fry foods. Cook foods using healthy methods such as baking, boiling, grilling, roasting, and broiling instead. Cook with heart-healthy oils, such as olive, canola, avocado, soybean, or sunflower oil. Meal planning  Eat a balanced diet that includes: 4 or more servings of fruits and 4 or more servings of vegetables each day. Try to fill one-half of your plate with fruits and vegetables. 6-8 servings of whole grains each day. Less than 6 oz (170 g) of lean meat, poultry, or fish each day. A 3-oz (85-g) serving of meat is about the same size as a deck of cards. One egg equals 1 oz (28 g). 2-3 servings of low-fat dairy each day. One serving is 1 cup (237 mL). 1 serving of nuts, seeds, or beans 5 times each week. 2-3 servings of  heart-healthy fats. Healthy fats called omega-3 fatty acids are found in foods such as walnuts, flaxseeds, fortified milks, and eggs. These fats are also found in cold-water fish, such as sardines, salmon, and mackerel. Limit how much you eat of: Canned or prepackaged foods. Food that is high in trans fat, such as some fried foods. Food that is high in saturated fat, such as fatty meat. Desserts and other sweets, sugary drinks, and other foods with added sugar. Full-fat dairy products. Do not salt foods before eating. Do not eat more than 4 egg yolks a week. Try to eat at least 2 vegetarian meals a week. Eat more home-cooked food and less restaurant, buffet, and fast food. Lifestyle When eating at a restaurant, ask that your food be prepared with less salt or no salt, if possible. If you drink alcohol: Limit how much you use to: 0-1 drink a day for women who are not pregnant. 0-2 drinks a day for men. Be aware of how much alcohol is in your drink. In the [place] one drink equals one 12 oz bottle of beer (355 mL), one 5 oz glass of wine (148 mL), or one 1 oz glass of hard liquor (44 mL). General information Avoid eating more than 2,300 mg of salt a day. If you have hypertension, you may need to reduce your sodium intake to 1,500 mg a day. Work with your health care provider to maintain a healthy body weight or to lose weight. Ask what an ideal weight is  for you. Get at least 30 minutes of exercise that causes your heart to beat faster (aerobic exercise) most days of the week. Activities may include walking, swimming, or biking. Work with your health care provider or dietitian to adjust your eating plan to your individual calorie needs. What foods should I eat? Fruits All fresh, dried, or frozen fruit. Canned fruit in natural juice (without added sugar). Vegetables Fresh or frozen vegetables (raw, steamed, roasted, or grilled). Low-sodium or reduced-sodium tomato and vegetable juice.  Low-sodium or reduced-sodium tomato sauce and tomato paste. Low-sodium or reduced-sodium canned vegetables. Grains Whole-grain or whole-wheat bread. Whole-grain or whole-wheat pasta. Brown rice. Sandra Wood. Bulgur. Whole-grain and low-sodium cereals. Pita bread. Low-fat, low-sodium crackers. Whole-wheat flour tortillas. Meats and other proteins Skinless chicken or [place]. Ground chicken or [place]. Pork with fat trimmed off. Fish and seafood. Egg whites. Dried beans, peas, or lentils. Unsalted nuts, nut butters, and seeds. Unsalted canned beans. Lean cuts of beef with fat trimmed off. Low-sodium, lean precooked or cured meat, such as sausages or meat loaves. Dairy Low-fat (1%) or fat-free (skim) milk. Reduced-fat, low-fat, or fat-free cheeses. Nonfat, low-sodium ricotta or cottage cheese. Low-fat or nonfat yogurt. Low-fat, low-sodium cheese. Fats and oils Soft margarine without trans fats. Vegetable oil. Reduced-fat, low-fat, or light mayonnaise and salad dressings (reduced-sodium). Canola, safflower, olive, avocado, soybean, and sunflower oils. Avocado. Seasonings and condiments Herbs. Spices. Seasoning mixes without salt. Other foods Unsalted popcorn and pretzels. Fat-free sweets. The items listed above may not be a complete list of foods and beverages you can eat. Contact a dietitian for more information. What foods should I avoid? Fruits Canned fruit in a light or heavy syrup. Fried fruit. Fruit in cream or butter sauce. Vegetables Creamed or fried vegetables. Vegetables in a cheese sauce. Regular canned vegetables (not low-sodium or reduced-sodium). Regular canned tomato sauce and paste (not low-sodium or reduced-sodium). Regular tomato and vegetable juice (not low-sodium or reduced-sodium). Sandra Wood. Olives. Grains Baked goods made with fat, such as croissants, muffins, or some breads. Dry pasta or rice meal packs. Meats and other proteins Fatty cuts of meat. Ribs. Fried meat. Sandra Wood.  Bologna, salami, and other precooked or cured meats, such as sausages or meat loaves. Fat from the back of a pig (fatback). Bratwurst. Salted nuts and seeds. Canned beans with added salt. Canned or smoked fish. Whole eggs or egg yolks. Chicken or [place] with skin. Dairy Whole or 2% milk, cream, and half-and-half. Whole or full-fat cream cheese. Whole-fat or sweetened yogurt. Full-fat cheese. Nondairy creamers. Whipped toppings. Processed cheese and cheese spreads. Fats and oils Butter. Stick margarine. Lard. Shortening. Ghee. Bacon fat. Tropical oils, such as coconut, palm kernel, or palm oil. Seasonings and condiments Onion salt, garlic salt, seasoned salt, table salt, and sea salt. Worcestershire sauce. Tartar sauce. Barbecue sauce. Teriyaki sauce. Soy sauce, including reduced-sodium. Steak sauce. Canned and packaged gravies. Fish sauce. Oyster sauce. Cocktail sauce. Store-bought horseradish. Ketchup. Mustard. Meat flavorings and tenderizers. Bouillon cubes. Hot sauces. Pre-made or packaged marinades. Pre-made or packaged taco seasonings. Relishes. Regular salad dressings. Other foods Salted popcorn and pretzels. The items listed above may not be a complete list of foods and beverages you should avoid. Contact a dietitian for more information. Where to find more information National Heart, Lung, and Blood Institute: https://wilson-eaton.com/ American Heart Association: www.heart.org Academy of Nutrition and Dietetics: www.eatright.[place]: www.kidney.org Summary The DASH eating plan is a healthy eating plan that has been shown to reduce high blood pressure (hypertension). It  may also reduce your risk for type 2 diabetes, heart disease, and stroke. When on the DASH eating plan, aim to eat more fresh fruits and vegetables, whole grains, lean proteins, low-fat dairy, and heart-healthy fats. With the DASH eating plan, you should limit salt (sodium) intake to 2,300 mg a day. If you have  hypertension, you may need to reduce your sodium intake to 1,500 mg a day. Work with your health care provider or dietitian to adjust your eating plan to your individual calorie needs. This information is not intended to replace advice given to you by your health care provider. Make sure you discuss any questions you have with your health care provider. Document Revised: 05/19/2019 Document Reviewed: 05/19/2019 Elsevier Patient Education  [place].

## 2021-12-24 NOTE — Assessment & Plan Note (Signed)
Refilled losartan 50 mg. BP close to goal. Check CMP and follow up.

## 2021-12-28 ENCOUNTER — Other Ambulatory Visit: Payer: Self-pay | Admitting: Internal Medicine

## 2021-12-28 DIAGNOSIS — E785 Hyperlipidemia, unspecified: Secondary | ICD-10-CM

## 2022-01-15 ENCOUNTER — Ambulatory Visit (INDEPENDENT_AMBULATORY_CARE_PROVIDER_SITE_OTHER): Payer: Medicare Other | Admitting: Internal Medicine

## 2022-01-15 ENCOUNTER — Encounter: Payer: Self-pay | Admitting: Internal Medicine

## 2022-01-15 VITALS — BP 130/72 | HR 82 | Ht 63.0 in | Wt 282.0 lb

## 2022-01-15 DIAGNOSIS — Z794 Long term (current) use of insulin: Secondary | ICD-10-CM | POA: Diagnosis not present

## 2022-01-15 DIAGNOSIS — E785 Hyperlipidemia, unspecified: Secondary | ICD-10-CM

## 2022-01-15 DIAGNOSIS — E1142 Type 2 diabetes mellitus with diabetic polyneuropathy: Secondary | ICD-10-CM | POA: Diagnosis not present

## 2022-01-15 DIAGNOSIS — E1165 Type 2 diabetes mellitus with hyperglycemia: Secondary | ICD-10-CM | POA: Diagnosis not present

## 2022-01-15 LAB — POCT GLYCOSYLATED HEMOGLOBIN (HGB A1C): Hemoglobin A1C: 8.3 % — AB (ref 4.0–5.6)

## 2022-01-15 LAB — POCT GLUCOSE (DEVICE FOR HOME USE): POC Glucose: 163 mg/dl — AB (ref 70–99)

## 2022-01-15 MED ORDER — BASAGLAR KWIKPEN 100 UNIT/ML ~~LOC~~ SOPN: 46.0000 [IU] | PEN_INJECTOR | Freq: Every day | SUBCUTANEOUS | 4 refills | Status: DC

## 2022-01-15 NOTE — Progress Notes (Signed)
Name: Sandra Wood  Age/ Sex: 66 y.o., female   MRN/ DOB: [id number], 07/06/1955     PCP: Janith Lima, MD   Reason for Endocrinology Evaluation: Type 2 Diabetes Mellitus  Initial Endocrine Consultative Visit: 11/07/2018    PATIENT IDENTIFIER: Sandra Wood is a 66 y.o. female with a past medical history of HTN, T2DM, Asthma, Hx of Breast Ca (2018). The patient has followed with Endocrinology clinic since 11/07/2018 for consultative assistance with management of her diabetes.  DIABETIC HISTORY:  Sandra Wood was diagnosed with T2DM in 2013. Pt was on diet control until 08/2018 when she was started on Metformin, Rybelsus and Insulin. Her hemoglobin A1c has ranged from 6.6% in 2013, peaking at 12.5% in 2020  On her initial visit to our clinic her A1c was 8.8 % . She was on Rybelsus, tresiba and Metformin  Stopped Metformin 10/2020 due to GI side effects  Attempted to start Jardiance through patient assistance program in January 2023  that she did not fill out   SUBJECTIVE:   During the last visit (07/17/2021): A1c 7.9% . Increased  basaglar  and continued Rybelsus and attempted to start on Jardiance through patient assistance   Today (01/15/2022): Sandra Wood is here for a follow up on her diabetes management.  She has not been checking glucose   She had freestyle libre but cost prohibitive  She was prescribed Jardiance by her PCP but has not   Avoids sugar sweetened beverages  Denies vomiting or diarrhea    HOME DIABETES REGIMEN:  Basaglar  42 units daily - still taking 40 units Rybelsus 14 mg daily        DIABETIC COMPLICATIONS: Microvascular complications:  neuropathy Denies: CKD,  Last eye exam: Completed 2021 Macrovascular complications:   Denies: CAD, PVD, CVA      HISTORY:  Past Medical History:  Past Medical History:  Diagnosis Date   Abnormal Pap smear of cervix 2009   Allergic rhinitis, cause unspecified    Arthritis    Arthritis of  ankle BILATERAL / HX FX'S   Asthma, mild persistent    Breast cancer ([place])    Dyspnea    with much activity   Fibroids 2008   GERD (gastroesophageal reflux disease)    not current   History of radiation therapy 01/27/17-03/16/17   left breast 1.8 Gy in 28 fractions, left breast boost 2 Gy in 6 fractions   HSV-2 (herpes simplex virus 2) infection    at age 22   Malignant neoplasm of lower-inner quadrant of left breast in female, estrogen receptor positive ([place]) 08/20/2016   Obesity, morbid ([place])    Personal history of chemotherapy    Personal history of radiation therapy    Pneumonia    2013, 2017   Tear of medial meniscus of knee LEFT   Type II or unspecified type diabetes mellitus without mention of complication, uncontrolled    Type II- has never been on medication   Past Surgical History:  Past Surgical History:  Procedure Laterality Date   BREAST BIOPSY     BREAST LUMPECTOMY Left 09/22/2016   BREAST LUMPECTOMY WITH RADIOACTIVE SEED AND SENTINEL LYMPH NODE BIOPSY Left 09/22/2016   Procedure: BREAST LUMPECTOMY WITH RADIOACTIVE SEED AND LEFT AXILLARY SENTINEL LYMPH NODE BIOPSY;  Surgeon: Alphonsa Overall, MD;  Location: [facility];  Service: General;  Laterality: Left;   CHONDROPLASTY  08/19/2011   Procedure: CHONDROPLASTY;  Surgeon: Magnus Sinning, MD;  Location: [facility];  Service: Orthopedics;;  shaving of medial femeral chondral   DILATION AND CURETTAGE OF UTERUS     HYSTEROSCOPY WITH D & C  2008   benign endometrial polyp - Dr. Maryelizabeth Rowan   KNEE ARTHROSCOPY W/ MENISCECTOMY Bilateral 07/07/2010   1 year apart   MENISECTOMY  08/19/2011   Procedure: MENISECTOMY;  Surgeon: Magnus Sinning, MD;  Location: [facility];  Service: Orthopedics;;  partial lateral menisectomy   PORT-A-CATH REMOVAL N/A 12/21/2017   Procedure: REMOVAL PORT-A-CATH;  Surgeon: Alphonsa Overall, MD;  Location: [facility];  Service: General;  Laterality: N/A;   PORTACATH PLACEMENT N/A  09/22/2016   Procedure: INSERTION PORT-A-CATH;  Surgeon: Alphonsa Overall, MD;  Location: [facility];  Service: General;  Laterality: N/A;   TONSILLECTOMY     TUBAL LIGATION  AGE 37   Social History:  reports that she quit smoking about 35 years ago. Her smoking use included cigarettes. She has never used smokeless tobacco. She reports that she does not currently use alcohol. She reports that she does not use drugs. Family History:  Family History  Problem Relation Age of Onset   Diabetes Mother    Heart disease Father    Cancer Brother 74       prostate   Breast cancer Maternal Grandmother 88     HOME MEDICATIONS: Allergies as of 01/15/2022       Reactions   Metformin And Related    GI upset    No Known Allergies         Medication List        Accurate as of January 15, 2022  9:33 AM. If you have any questions, ask your nurse or doctor.          anastrozole 1 MG tablet Commonly known as: ARIMIDEX Take 1 tablet (1 mg total) by mouth daily.   Basaglar KwikPen 100 UNIT/ML Inject 42 Units into the skin daily.   BD Pen Needle Micro U/F 32G X 6 MM Misc Generic drug: Insulin Pen Needle 1 Device by Does not apply route daily in the afternoon.   blood glucose meter kit and supplies Kit Dispense based on patient and insurance preference. Use up to four times daily as directed. (FOR ICD-9 250.00, 250.01). Check blood sugar BID.   clotrimazole 1 % cream Commonly known as: Clotrimazole AF Apply 1 application topically 2 (two) times daily. Use for 2 weeks as needed.   cyanocobalamin 1000 MCG/ML injection Commonly known as: (VITAMIN B-12) INJECT 1 ML (1,000 MCG TOTAL) INTO THE MUSCLE ONCE FOR 1 DOSE.   losartan 50 MG tablet Commonly known as: COZAAR Take 1 tablet (50 mg total) by mouth daily.   nystatin powder Commonly known as: MYCOSTATIN/NYSTOP Apply topically 3 (three) times daily. Apply to affected area for up to 7 days   OneTouch Verio test strip Generic drug: glucose  blood Use as instructed   rosuvastatin 20 MG tablet Commonly known as: CRESTOR TAKE 1 TABLET BY MOUTH EVERY DAY   Rybelsus 14 MG Tabs Generic drug: Semaglutide Take 1 tablet by mouth daily with breakfast.         OBJECTIVE:   Vital Signs: BP 130/72 (BP Location: Right Arm, Patient Position: Sitting, Cuff Size: Large)   Pulse 82   Ht $R'5\' 3"'Gn$  (1.6 m)   Wt 282 lb (127.9 kg)   LMP 06/30/2003   SpO2 94%   BMI 49.95 kg/m   Wt Readings from Last 3 Encounters:  01/15/22 282 lb (127.9  kg)  12/24/21 283 lb (128.4 kg)  07/17/21 293 lb 9.6 oz (133.2 kg)     Exam: General: Pt appears well and is in NAD  Lungs: Clear with good BS bilat with no rales, rhonchi, or wheezes  Heart: RRR with normal S1 and S2 and no gallops; no murmurs; no rub  Extremities: No pretibial edema.   Neuro: MS is good with appropriate affect, pt is alert and Ox3       DM foot exam: 07/17/2021    The skin of the feet is intact without sores or ulcerations.Plantar callous formation noted bilaterally The pedal pulses are 2+ on right and 2+ on left. The sensation is decreased  to a screening 5.07, 10 gram monofilament bilaterally at the heels       DATA REVIEWED:  Lab Results  Component Value Date   HGBA1C 8.3 (A) 01/15/2022   HGBA1C 7.8 (A) 07/17/2021   HGBA1C 7.9 (H) 04/03/2021    Latest Reference Range & Units 04/03/21 10:21  Sodium 135 - 145 mEq/L 137  Potassium 3.5 - 5.1 mEq/L 4.3  Chloride 96 - 112 mEq/L 103  CO2 19 - 32 mEq/L 27  Glucose 70 - 99 mg/dL 136 (H)  BUN 6 - 23 mg/dL 9  Creatinine 0.40 - 1.20 mg/dL 0.70  Calcium 8.4 - 10.5 mg/dL 9.7  GFR >60.00 mL/min 90.06     ASSESSMENT / PLAN / RECOMMENDATIONS:   1) Type 2 Diabetes Mellitus, Poorly controlled, With Neuropathic complications - Most recent A1c of 8.3 %. Goal A1c < 7.0 %.    - A1c has worsened  - Intolerant to  metformin due to GI issues - CGM cost prohibitive  - Jardiance cost-prohibitive , she was provided with pt  assistance forms in 06/2021 but she did not fill out - She continues to use less insulin than previously prescribed  -We will increase insulin as below -She is at maximum dose for Rybelsus - We discussed that if her hyperglycemia continues to worsen, will have to start her on prandial dose of insulin     MEDICATIONS: - Increase  Basaglar 46 Units daily  - Continue Rybelsus 14 mg daily     EDUCATION / INSTRUCTIONS: BG monitoring instructions: Patient is instructed to check her blood sugars 2 times a day, fasting and bedtime. Call [facility] if: BG persistently < 70 I reviewed the Rule of 15 for the treatment of hypoglycemia in detail with the patient. Literature supplied.   2) Dyslipidemia :  -LDL has been at goal  Medication Continue rosuvastatin 20 mg daily   F/U in 6 months    Signed electronically by: Mack Guise, MD  [facility]  [facility] [address] [place], [place] [address] Phone: [phone] FAX: [phone]   CC: Janith Lima, MD [place] [address] Phone: [phone]  Fax: [phone]  Return to Endocrinology clinic as below: Future Appointments  Date Time Provider [place]  01/21/2022  9:00 AM [facility] [place] [facility] None  02/16/2022 10:10 AM [facility] MM 2 [facility] GI-BREAST [facility]  03/03/2022  9:30 AM Nicholas Lose, MD [facility] None  04/15/2022  8:40 AM Janith Lima, MD [facility] None

## 2022-01-15 NOTE — Patient Instructions (Addendum)
-   Increase Basaglar 46 Units daily  - Continue Rybelsus 14 mg, 1 tablet daily      HOW TO TREAT LOW BLOOD SUGARS (Blood sugar LESS THAN 70 MG/DL) Please follow the RULE OF 15 for the treatment of hypoglycemia treatment (when your (blood sugars are less than 70 mg/dL)   STEP 1: Take 15 grams of carbohydrates when your blood sugar is low, which includes:  3-4 GLUCOSE TABS  OR 3-4 OZ OF JUICE OR REGULAR SODA OR ONE TUBE OF GLUCOSE GEL    STEP 2: RECHECK blood sugar in 15 MINUTES STEP 3: If your blood sugar is still low at the 15 minute recheck --> then, go back to STEP 1 and treat AGAIN with another 15 grams of carbohydrates.

## 2022-01-21 ENCOUNTER — Ambulatory Visit (INDEPENDENT_AMBULATORY_CARE_PROVIDER_SITE_OTHER): Payer: Medicare Other

## 2022-01-21 DIAGNOSIS — E538 Deficiency of other specified B group vitamins: Secondary | ICD-10-CM

## 2022-01-21 DIAGNOSIS — G63 Polyneuropathy in diseases classified elsewhere: Secondary | ICD-10-CM | POA: Diagnosis not present

## 2022-01-21 MED ORDER — CYANOCOBALAMIN 1000 MCG/ML IJ SOLN
1000.0000 ug | Freq: Once | INTRAMUSCULAR | Status: AC
  Administered 2022-01-21: 1000 ug via INTRAMUSCULAR

## 2022-01-21 NOTE — Progress Notes (Signed)
After obtaining consent, and per orders of Dr. Jones, injection of B12 given by Ronell Boldin. Patient tolerated injection well in left deltoid and instructed to report any adverse reaction to me immediately.  

## 2022-02-16 ENCOUNTER — Ambulatory Visit
Admission: RE | Admit: 2022-02-16 | Discharge: 2022-02-16 | Disposition: A | Discharge status: Home / Self Care | Payer: Medicare Other | Source: Ambulatory Visit | Attending: Hematology and Oncology | Admitting: Hematology and Oncology

## 2022-02-16 DIAGNOSIS — Z1231 Encounter for screening mammogram for malignant neoplasm of breast: Secondary | ICD-10-CM

## 2022-02-18 ENCOUNTER — Other Ambulatory Visit: Payer: Self-pay | Admitting: Hematology and Oncology

## 2022-02-18 DIAGNOSIS — R928 Other abnormal and inconclusive findings on diagnostic imaging of breast: Secondary | ICD-10-CM

## 2022-02-23 ENCOUNTER — Ambulatory Visit: Payer: Medicare Other

## 2022-02-23 ENCOUNTER — Other Ambulatory Visit: Payer: Self-pay | Admitting: Hematology and Oncology

## 2022-02-23 ENCOUNTER — Ambulatory Visit
Admission: RE | Admit: 2022-02-23 | Discharge: 2022-02-23 | Disposition: A | Discharge status: Home / Self Care | Payer: Medicare Other | Source: Ambulatory Visit | Attending: Hematology and Oncology | Admitting: Hematology and Oncology

## 2022-02-23 DIAGNOSIS — R921 Mammographic calcification found on diagnostic imaging of breast: Secondary | ICD-10-CM

## 2022-02-23 DIAGNOSIS — R928 Other abnormal and inconclusive findings on diagnostic imaging of breast: Secondary | ICD-10-CM

## 2022-02-26 ENCOUNTER — Ambulatory Visit (INDEPENDENT_AMBULATORY_CARE_PROVIDER_SITE_OTHER): Payer: Medicare Other

## 2022-02-26 DIAGNOSIS — G63 Polyneuropathy in diseases classified elsewhere: Secondary | ICD-10-CM

## 2022-02-26 DIAGNOSIS — E538 Deficiency of other specified B group vitamins: Secondary | ICD-10-CM | POA: Diagnosis not present

## 2022-02-26 MED ORDER — CYANOCOBALAMIN 1000 MCG/ML IJ SOLN
1000.0000 ug | Freq: Once | INTRAMUSCULAR | Status: AC
  Administered 2022-02-26: 1000 ug via INTRAMUSCULAR

## 2022-02-26 NOTE — Progress Notes (Signed)
B12 given.  Pt tolerated well. Pt is aware to give the office a call for an side effects or reactions. Please co-sign.   

## 2022-02-27 NOTE — Progress Notes (Signed)
Patient Care Team: Etta Grandchild, MD as PCP - General (Internal Medicine) Ovidio Kin, MD as Consulting Physician (General Surgery) Serena Croissant, MD as Consulting Physician (Hematology and Oncology) Lonie Peak, MD as Attending Physician (Radiation Oncology) Patton Salles, MD as Consulting Physician (Obstetrics and Gynecology)  DIAGNOSIS: No diagnosis found.  SUMMARY OF ONCOLOGIC HISTORY: Oncology History  Malignant neoplasm of lower-inner quadrant of left breast in female, estrogen receptor positive ([facility])  08/18/2016 Initial Diagnosis   Left breast biopsy 8:00 retroareolar position: IDC, high-grade with DCIS high-grade, ER 90%, PR 95%, Ki-67 20%, HER-2 positive ratio 3.4; screening detected left breast distortion LIQ 8 8:00 retroareolar 0.8 cm, axilla negative, T1b N0 stage IA clinical stage   09/22/2016 Surgery   Left lumpectomy: IDC grade 3, 1.3 cm, DCIS high-grade, margins clear, 0/1 lymph node negative, T1CN 0 stage IA, ER 90%, PR 95%, Ki-67 20%, HER-2 positive ratio 3.4   10/19/2016 - 01/04/2017 Chemotherapy   Taxol Herceptin weekly 12 followed by Herceptin maintenance every 3 weeks for 1 year    01/27/2017 - 03/16/2017 Radiation Therapy   Adjuvant radiation therapy   05/10/2017 -  Anti-estrogen oral therapy   Anastrozole 1 mg p.o. daily     CHIEF COMPLIANT:   INTERVAL HISTORY: Sandra Wood is a   ALLERGIES:  is allergic to metformin and related and no known allergies.  MEDICATIONS:  Current Outpatient Medications  Medication Sig Dispense Refill   anastrozole (ARIMIDEX) 1 MG tablet Take 1 tablet (1 mg total) by mouth daily. 90 tablet 3   blood glucose meter kit and supplies KIT Dispense based on patient and insurance preference. Use up to four times daily as directed. (FOR ICD-9 250.00, 250.01). Check blood sugar BID. 1 each 0   clotrimazole (CLOTRIMAZOLE AF) 1 % cream Apply 1 application topically 2 (two) times daily. Use for 2 weeks as needed.  30 g 1   cyanocobalamin (,VITAMIN B-12,) 1000 MCG/ML injection INJECT 1 ML (1,000 MCG TOTAL) INTO THE MUSCLE ONCE FOR 1 DOSE. 1 mL 1   glucose blood (ONETOUCH VERIO) test strip Use as instructed 100 each 12   Insulin Glargine (BASAGLAR KWIKPEN) 100 UNIT/ML Inject 46 Units into the skin daily. 45 mL 4   Insulin Pen Needle (BD PEN NEEDLE MICRO U/F) 32G X 6 MM MISC 1 Device by Does not apply route daily in the afternoon. 100 each 3   losartan (COZAAR) 50 MG tablet Take 1 tablet (50 mg total) by mouth daily. 90 tablet 0   nystatin (MYCOSTATIN/NYSTOP) powder Apply topically 3 (three) times daily. Apply to affected area for up to 7 days 45 g 1   rosuvastatin (CRESTOR) 20 MG tablet TAKE 1 TABLET BY MOUTH EVERY DAY 90 tablet 1   Semaglutide (RYBELSUS) 14 MG TABS Take 1 tablet by mouth daily with breakfast. 90 tablet 3   No current facility-administered medications for this visit.    PHYSICAL EXAMINATION: ECOG PERFORMANCE STATUS: {CHL ONC ECOG PS:[id number]}  There were no vitals filed for this visit. There were no vitals filed for this visit.  BREAST:*** No palpable masses or nodules in either right or left breasts. No palpable axillary supraclavicular or infraclavicular adenopathy no breast tenderness or nipple discharge. (exam performed in the presence of a chaperone)  LABORATORY DATA:  I have reviewed the data as listed    Latest Ref Rng & Units 12/24/2021    9:38 AM 04/03/2021   10:21 AM 03/04/2021    9:54 AM  CMP  Glucose 70 - 99 mg/dL 195  136  169   BUN 6 - 23 mg/dL $Remove'16  9  13   'HZexqDQ$ Creatinine 0.40 - 1.20 mg/dL 0.67  0.70  0.68   Sodium 135 - 145 mEq/L 138  137  134   Potassium 3.5 - 5.1 mEq/L 4.1  4.3  3.7   Chloride 96 - 112 mEq/L 102  103  101   CO2 19 - 32 mEq/L $Remove'26  27  23   'vKhIcBm$ Calcium 8.4 - 10.5 mg/dL 9.8  9.7  9.0     Lab Results  Component Value Date   WBC 6.9 03/04/2021   HGB 14.9 03/04/2021   HCT 47.8 (H) 03/04/2021   MCV 82.0 03/04/2021   PLT 254 03/04/2021   NEUTROABS 3.8  03/04/2021    ASSESSMENT & PLAN:  No problem-specific Assessment & Plan notes found for this encounter.    No orders of the defined types were placed in this encounter.  The patient has a good understanding of the overall plan. she agrees with it. she will call with any problems that may develop before the next visit here. Total time spent: 30 mins including face to face time and time spent for planning, charting and co-ordination of care   Suzzette Righter, [place] 02/27/22    I Gardiner Coins am scribing for Dr. Lindi Adie  ***

## 2022-03-03 ENCOUNTER — Other Ambulatory Visit: Payer: Self-pay

## 2022-03-03 ENCOUNTER — Inpatient Hospital Stay: Payer: Medicare Other | Attending: Hematology and Oncology | Admitting: Hematology and Oncology

## 2022-03-03 DIAGNOSIS — Z170 Estrogen receptor positive status [ER+]: Secondary | ICD-10-CM

## 2022-03-03 DIAGNOSIS — Z9221 Personal history of antineoplastic chemotherapy: Secondary | ICD-10-CM | POA: Diagnosis not present

## 2022-03-03 DIAGNOSIS — Z923 Personal history of irradiation: Secondary | ICD-10-CM | POA: Diagnosis not present

## 2022-03-03 DIAGNOSIS — Z79811 Long term (current) use of aromatase inhibitors: Secondary | ICD-10-CM | POA: Diagnosis not present

## 2022-03-03 DIAGNOSIS — C50312 Malignant neoplasm of lower-inner quadrant of left female breast: Secondary | ICD-10-CM

## 2022-03-03 MED ORDER — ANASTROZOLE 1 MG PO TABS: 1.0000 mg | ORAL_TABLET | Freq: Every day | ORAL | 3 refills | Status: DC

## 2022-03-03 NOTE — Assessment & Plan Note (Addendum)
Left breast biopsy02/20/2018:8:00 retroareolar position: IDC, high-grade with DCIS high-grade, ER 90%, PR 95%, Ki-67 20%, HER-2 positive ratio 3.4; screening detected left breast distortion LIQ 8 8:00 retroareolar 0.8 cm, axilla negative, T1b N0 stage IA clinical stage Left lumpectomy 09/22/2016: IDC grade 3, 1.3 cm, DCIS high-grade, margins clear, 0/1 lymph node negative, T1 CN 0 stage IA, ER 90%, PR 95%, Ki-67 20%, HER-2 positive ratio 3.4  Treatment summary: 1. Adjuvant chemotherapy and Herceptin with weekly Taxol and Herceptin 12 completed 01/04/2017 followed by Herceptin maintenance for 1 year completed 10/04/2017 3.Adjuvant radiation8/06/2016-03/16/2017 4.Adjuvant antiestrogen therapy with letrozole 2.5 mg daily  7 yearsstarted 05/10/2017 ----------------------------------------------------------------------------- Diabetes:No change in the current plan  Anastrozoletoxicities: Mild hot flashes. She has chronic osteoarthritis related to weight.  Breast cancer surveillance: 1.Mammogramand ultrasound8/11/2019: Benign,breast density category B   Bone density: T score -2: Osteopenia: Encouraged her to take vitamin D and double up on the calcium.  Weight issues: Patient has lost about 15 pounds by watching what she eats.  Return to clinic in 1 year for follow-up. I sent a prescription refill for anastrozole.

## 2022-03-27 ENCOUNTER — Other Ambulatory Visit: Payer: Self-pay | Admitting: Internal Medicine

## 2022-03-27 DIAGNOSIS — E118 Type 2 diabetes mellitus with unspecified complications: Secondary | ICD-10-CM

## 2022-03-30 ENCOUNTER — Ambulatory Visit (INDEPENDENT_AMBULATORY_CARE_PROVIDER_SITE_OTHER): Payer: Medicare Other | Admitting: *Deleted

## 2022-03-30 DIAGNOSIS — E538 Deficiency of other specified B group vitamins: Secondary | ICD-10-CM | POA: Diagnosis not present

## 2022-03-30 DIAGNOSIS — G63 Polyneuropathy in diseases classified elsewhere: Secondary | ICD-10-CM | POA: Diagnosis not present

## 2022-03-30 MED ORDER — CYANOCOBALAMIN 1000 MCG/ML IJ SOLN
1000.0000 ug | Freq: Once | INTRAMUSCULAR | Status: AC
  Administered 2022-03-30: 1000 ug via INTRAMUSCULAR

## 2022-03-30 NOTE — Progress Notes (Signed)
Pls cosign for B12 inj../lmb  

## 2022-04-15 ENCOUNTER — Encounter: Payer: Self-pay | Admitting: Internal Medicine

## 2022-04-15 ENCOUNTER — Ambulatory Visit (INDEPENDENT_AMBULATORY_CARE_PROVIDER_SITE_OTHER): Payer: Medicare Other | Admitting: Internal Medicine

## 2022-04-15 VITALS — BP 132/74 | HR 83 | Temp 98.0°F | Ht 63.0 in | Wt 286.5 lb

## 2022-04-15 DIAGNOSIS — E785 Hyperlipidemia, unspecified: Secondary | ICD-10-CM | POA: Diagnosis not present

## 2022-04-15 DIAGNOSIS — E1165 Type 2 diabetes mellitus with hyperglycemia: Secondary | ICD-10-CM

## 2022-04-15 DIAGNOSIS — Z23 Encounter for immunization: Secondary | ICD-10-CM | POA: Diagnosis not present

## 2022-04-15 DIAGNOSIS — Z794 Long term (current) use of insulin: Secondary | ICD-10-CM | POA: Diagnosis not present

## 2022-04-15 DIAGNOSIS — E538 Deficiency of other specified B group vitamins: Secondary | ICD-10-CM

## 2022-04-15 DIAGNOSIS — D485 Neoplasm of uncertain behavior of skin: Secondary | ICD-10-CM | POA: Insufficient documentation

## 2022-04-15 DIAGNOSIS — G63 Polyneuropathy in diseases classified elsewhere: Secondary | ICD-10-CM

## 2022-04-15 DIAGNOSIS — K219 Gastro-esophageal reflux disease without esophagitis: Secondary | ICD-10-CM | POA: Diagnosis not present

## 2022-04-15 DIAGNOSIS — I119 Hypertensive heart disease without heart failure: Secondary | ICD-10-CM

## 2022-04-15 DIAGNOSIS — I10 Essential (primary) hypertension: Secondary | ICD-10-CM

## 2022-04-15 DIAGNOSIS — Z1211 Encounter for screening for malignant neoplasm of colon: Secondary | ICD-10-CM

## 2022-04-15 DIAGNOSIS — L821 Other seborrheic keratosis: Secondary | ICD-10-CM | POA: Insufficient documentation

## 2022-04-15 DIAGNOSIS — L850 Acquired ichthyosis: Secondary | ICD-10-CM | POA: Insufficient documentation

## 2022-04-15 LAB — URINALYSIS, ROUTINE W REFLEX MICROSCOPIC
Hgb urine dipstick: NEGATIVE
Leukocytes,Ua: NEGATIVE
Nitrite: NEGATIVE
RBC / HPF: NONE SEEN (ref 0–?)
Specific Gravity, Urine: >=1.03 — AB (ref 1.000–1.030)
Urine Glucose: NEGATIVE
Urobilinogen, UA: 1 (ref 0.0–1.0)
pH: 5.5 (ref 5.0–8.0)

## 2022-04-15 LAB — BASIC METABOLIC PANEL
BUN: 14 mg/dL (ref 6–23)
CO2: 26 mEq/L (ref 19–32)
Calcium: 9.4 mg/dL (ref 8.4–10.5)
Chloride: 103 mEq/L (ref 96–112)
Creatinine, Ser: 0.69 mg/dL (ref 0.40–1.20)
GFR: 89.72 mL/min (ref >60.00)
Glucose, Bld: 180 mg/dL — ABNORMAL HIGH (ref 70–99)
Potassium: 3.9 mEq/L (ref 3.5–5.1)
Sodium: 137 mEq/L (ref 135–145)

## 2022-04-15 LAB — CBC WITH DIFFERENTIAL/PLATELET
Basophils Absolute: 0.1 10*3/uL (ref 0.0–0.1)
Basophils Relative: 0.7 % (ref 0.0–3.0)
Eosinophils Absolute: 0.6 10*3/uL (ref 0.0–0.7)
Eosinophils Relative: 6.2 % — ABNORMAL HIGH (ref 0.0–5.0)
HCT: 44.2 % (ref 36.0–46.0)
Hemoglobin: 14.3 g/dL (ref 12.0–15.0)
Lymphocytes Relative: 23.3 % (ref 12.0–46.0)
Lymphs Abs: 2.4 10*3/uL (ref 0.7–4.0)
MCHC: 32.4 g/dL (ref 30.0–36.0)
MCV: 81.5 fl (ref 78.0–100.0)
Monocytes Absolute: 0.8 10*3/uL (ref 0.1–1.0)
Monocytes Relative: 7.8 % (ref 3.0–12.0)
Neutro Abs: 6.4 10*3/uL (ref 1.4–7.7)
Neutrophils Relative %: 62 % (ref 43.0–77.0)
Platelets: 343 10*3/uL (ref 150.0–400.0)
RBC: 5.42 Mil/uL — ABNORMAL HIGH (ref 3.87–5.11)
RDW: 15.5 % (ref 11.5–15.5)
WBC: 10.2 10*3/uL (ref 4.0–10.5)

## 2022-04-15 LAB — HEPATIC FUNCTION PANEL
ALT: 11 U/L (ref 0–35)
AST: 13 U/L (ref 0–37)
Albumin: 4 g/dL (ref 3.5–5.2)
Alkaline Phosphatase: 133 U/L — ABNORMAL HIGH (ref 39–117)
Bilirubin, Direct: 0.1 mg/dL (ref 0.0–0.3)
Total Bilirubin: 0.6 mg/dL (ref 0.2–1.2)
Total Protein: 7.8 g/dL (ref 6.0–8.3)

## 2022-04-15 LAB — LIPID PANEL
Cholesterol: 130 mg/dL (ref 0–200)
HDL: 46.2 mg/dL (ref >39.00)
LDL Cholesterol: 56 mg/dL (ref 0–99)
NonHDL: 83.4
Total CHOL/HDL Ratio: 3
Triglycerides: 136 mg/dL (ref 0.0–149.0)
VLDL: 27.2 mg/dL (ref 0.0–40.0)

## 2022-04-15 LAB — MICROALBUMIN / CREATININE URINE RATIO
Creatinine,U: 271 mg/dL
Microalb Creat Ratio: 0.9 mg/g (ref 0.0–30.0)
Microalb, Ur: 2.5 mg/dL — ABNORMAL HIGH (ref 0.0–1.9)

## 2022-04-15 LAB — TSH: TSH: 3.98 u[IU]/mL (ref 0.35–5.50)

## 2022-04-15 LAB — FOLATE: Folate: 15.9 ng/mL (ref >5.9)

## 2022-04-15 LAB — HEMOGLOBIN A1C: Hgb A1c MFr Bld: 8.4 % — ABNORMAL HIGH (ref 4.6–6.5)

## 2022-04-15 MED ORDER — LOSARTAN POTASSIUM 50 MG PO TABS: 50.0000 mg | ORAL_TABLET | Freq: Every day | ORAL | 1 refills | Status: DC

## 2022-04-15 NOTE — Progress Notes (Addendum)
Subjective:  Patient ID: Sandra Wood, female    DOB: [birth year]  Age: 66 y.o. MRN: [id number]  CC: Hypertension, Diabetes, and Hyperlipidemia   HPI AMI MALLY presents for f/up -  She walks about 10 minutes per day. She denies DOE, CP, SOB, edema.  Outpatient Medications Prior to Visit  Medication Sig Dispense Refill   anastrozole (ARIMIDEX) 1 MG tablet Take 1 tablet (1 mg total) by mouth daily. 90 tablet 3   BD PEN NEEDLE NANO 2ND GEN 32G X 4 MM MISC INJECT 1 DEVICE INTO THE SKIN DAILY. 100 each 3   blood glucose meter kit and supplies KIT Dispense based on patient and insurance preference. Use up to four times daily as directed. (FOR ICD-9 250.00, 250.01). Check blood sugar BID. 1 each 0   clotrimazole (CLOTRIMAZOLE AF) 1 % cream Apply 1 application topically 2 (two) times daily. Use for 2 weeks as needed. 30 g 1   cyanocobalamin (,VITAMIN B-12,) 1000 MCG/ML injection INJECT 1 ML (1,000 MCG TOTAL) INTO THE MUSCLE ONCE FOR 1 DOSE. 1 mL 1   glucose blood (ONETOUCH VERIO) test strip Use as instructed 100 each 12   Insulin Glargine (BASAGLAR KWIKPEN) 100 UNIT/ML Inject 46 Units into the skin daily. 45 mL 4   Insulin Pen Needle (BD PEN NEEDLE MICRO U/F) 32G X 6 MM MISC 1 Device by Does not apply route daily in the afternoon. 100 each 3   nystatin (MYCOSTATIN/NYSTOP) powder Apply topically 3 (three) times daily. Apply to affected area for up to 7 days 45 g 1   rosuvastatin (CRESTOR) 20 MG tablet TAKE 1 TABLET BY MOUTH EVERY DAY 90 tablet 1   Semaglutide (RYBELSUS) 14 MG TABS Take 1 tablet by mouth daily with breakfast. 90 tablet 3   losartan (COZAAR) 50 MG tablet Take 1 tablet (50 mg total) by mouth daily. 90 tablet 0   No facility-administered medications prior to visit.    ROS Review of Systems  Constitutional:  Negative for appetite change, diaphoresis, fatigue and unexpected weight change.  HENT: Negative.    Eyes: Negative.   Respiratory:  Negative for cough, chest  tightness, shortness of breath and wheezing.   Cardiovascular:  Negative for chest pain, palpitations and leg swelling.  Gastrointestinal:  Negative for abdominal pain, constipation, diarrhea, nausea and vomiting.  Endocrine: Negative.   Genitourinary: Negative.  Negative for difficulty urinating.  Musculoskeletal:  Positive for arthralgias. Negative for myalgias.  Skin: Negative.   Neurological:  Negative for dizziness, weakness, light-headedness and headaches.  Hematological:  Negative for adenopathy. Does not bruise/bleed easily.  Psychiatric/Behavioral: Negative.      Objective:  BP 132/74 (BP Location: Right Arm, Patient Position: Sitting, Cuff Size: Large) Comment (Cuff Size): thigh cuff  Pulse 83   Temp 98 F (36.7 C) (Oral)   Ht $R'5\' 3"'vy$  (1.6 m)   Wt 286 lb 8 oz (130 kg)   LMP 06/30/2003   SpO2 91%   BMI 50.75 kg/m   BP Readings from Last 3 Encounters:  04/15/22 132/74  03/03/22 138/81  01/15/22 130/72    Wt Readings from Last 3 Encounters:  04/15/22 286 lb 8 oz (130 kg)  03/03/22 284 lb 4.8 oz (129 kg)  01/15/22 282 lb (127.9 kg)    Physical Exam Vitals reviewed.  Constitutional:      General: She is not in acute distress.    Appearance: She is obese. She is not toxic-appearing or diaphoretic.  HENT:  Nose: Nose normal.     Mouth/Throat:     Mouth: Mucous membranes are moist.  Eyes:     General: No scleral icterus.    Conjunctiva/sclera: Conjunctivae normal.  Cardiovascular:     Rate and Rhythm: Normal rate and regular rhythm.     Heart sounds: No murmur heard.    Comments: EKG- NSR, 76 bpm Minimal LVH - normal variant No ST/T wave changes or Q waves Pulmonary:     Effort: Pulmonary effort is normal.     Breath sounds: No stridor. No wheezing, rhonchi or rales.  Abdominal:     General: Abdomen is flat.     Palpations: There is no mass.     Tenderness: There is no abdominal tenderness. There is no guarding.     Hernia: No hernia is present.   Musculoskeletal:        General: Normal range of motion.     Cervical back: Neck supple.     Right lower leg: No edema.     Left lower leg: No edema.  Lymphadenopathy:     Cervical: No cervical adenopathy.  Skin:    General: Skin is warm and dry.  Neurological:     General: No focal deficit present.     Mental Status: She is alert.  Psychiatric:        Mood and Affect: Mood normal.        Behavior: Behavior normal.     Lab Results  Component Value Date   WBC 10.2 04/15/2022   HGB 14.3 04/15/2022   HCT 44.2 04/15/2022   PLT 343.0 04/15/2022   GLUCOSE 180 (H) 04/15/2022   CHOL 130 04/15/2022   TRIG 136.0 04/15/2022   HDL 46.20 04/15/2022   LDLDIRECT 177.0 09/13/2018   LDLCALC 56 04/15/2022   ALT 11 04/15/2022   AST 13 04/15/2022   NA 137 04/15/2022   K 3.9 04/15/2022   CL 103 04/15/2022   CREATININE 0.69 04/15/2022   BUN 14 04/15/2022   CO2 26 04/15/2022   TSH 3.98 04/15/2022   INR 1.1 RATIO 07/09/2006   HGBA1C 8.4 Repeated and verified X2. (H) 04/15/2022   MICROALBUR 2.5 (H) 04/15/2022    MM Digital Diagnostic Unilat L  Result Date: 02/23/2022 CLINICAL DATA:  History of left breast cancer status post lumpectomy in 2018. Patient was recalled from screening mammogram for left breast calcifications. EXAM: DIGITAL DIAGNOSTIC UNILATERAL LEFT MAMMOGRAM WITH CAD TECHNIQUE: Left digital diagnostic mammography was performed. COMPARISON:  Previous exam(s). ACR Breast Density Category c: The breast tissue is heterogeneously dense, which may obscure small masses. FINDINGS: Additional imaging of the left breast was performed. There are round punctate calcifications as well as dystrophic appearing calcifications in the lumpectomy site. The calcifications are all felt to likely be benign. IMPRESSION: Probable benign calcifications in the left breast. RECOMMENDATION: Short-term interval follow-up left mammogram in 6 months is recommended. I have discussed the findings and  recommendations with the patient. If applicable, a reminder letter will be sent to the patient regarding the next appointment. BI-RADS CATEGORY  3: Probably benign. Electronically Signed   By: Lillia Mountain M.D.   On: 02/23/2022 16:48   Assessment & Plan:   Aleila was seen today for hypertension, diabetes and hyperlipidemia.  Diagnoses and all orders for this visit:  Essential hypertension- Her blood pressure is adequately well controlled. -     Basic metabolic panel; Future -     CBC with Differential/Platelet; Future -  TSH; Future -     Urinalysis, Routine w reflex microscopic; Future -     EKG 12-Lead -     Urinalysis, Routine w reflex microscopic -     TSH -     CBC with Differential/Platelet -     Basic metabolic panel -     losartan (COZAAR) 50 MG tablet; Take 1 tablet (50 mg total) by mouth daily.  Gastroesophageal reflux disease without esophagitis  Type 2 diabetes mellitus with hyperglycemia, with long-term current use of insulin ([place])- Her blood sugar is adequately well controlled. -     Microalbumin / creatinine urine ratio; Future -     Hemoglobin A1c; Future -     Ambulatory referral to Ophthalmology -     Hemoglobin A1c -     Microalbumin / creatinine urine ratio  Vitamin B12 deficiency neuropathy (HCC) -     Basic metabolic panel; Future -     CBC with Differential/Platelet; Future -     Folate; Future -     Folate -     CBC with Differential/Platelet -     Basic metabolic panel  Hyperlipidemia LDL goal <100- LDL goal achieved. Doing well on the statin  -     Lipid panel; Future -     TSH; Future -     Hepatic function panel; Future -     Hepatic function panel -     TSH -     Lipid panel  Need for vaccination -     Flu Vaccine QUAD High Dose(Fluad)  Screen for colon cancer -     Cologuard  LVH (left ventricular hypertrophy) due to hypertensive disease, without heart failure -     Ambulatory referral to Cardiology -     losartan (COZAAR) 50 MG  tablet; Take 1 tablet (50 mg total) by mouth daily.   I am having [employer]" maintain her blood glucose meter kit and supplies, OneTouch Verio, nystatin, clotrimazole, Rybelsus, BD Pen Needle Micro U/F, cyanocobalamin, rosuvastatin, Basaglar KwikPen, anastrozole, BD Pen Needle Nano 2nd Gen, and losartan.  Meds ordered this encounter  Medications   losartan (COZAAR) 50 MG tablet    Sig: Take 1 tablet (50 mg total) by mouth daily.    Dispense:  90 tablet    Refill:  1     Follow-up: Return in about 6 months (around 10/15/2022).  Scarlette Calico, MD

## 2022-04-15 NOTE — Addendum Note (Signed)
Addended by: Janith Lima on: 04/15/2022 05:23 PM   Modules accepted: Orders

## 2022-04-15 NOTE — Patient Instructions (Signed)
Hypertension, Adult High blood pressure (hypertension) is when the force of blood pumping through the arteries is too strong. The arteries are the blood vessels that carry blood from the heart throughout the body. Hypertension forces the heart to work harder to pump blood and may cause arteries to become narrow or stiff. Untreated or uncontrolled hypertension can lead to a heart attack, heart failure, a stroke, kidney disease, and other problems. A blood pressure reading consists of a higher number over a lower number. Ideally, your blood pressure should be below 120/80. The first ("top") number is called the systolic pressure. It is a measure of the pressure in your arteries as your heart beats. The second ("bottom") number is called the diastolic pressure. It is a measure of the pressure in your arteries as the heart relaxes. What are the causes? The exact cause of this condition is not known. There are some conditions that result in high blood pressure. What increases the risk? Certain factors may make you more likely to develop high blood pressure. Some of these risk factors are under your control, including: Smoking. Not getting enough exercise or physical activity. Being overweight. Having too much fat, sugar, calories, or salt (sodium) in your diet. Drinking too much alcohol. Other risk factors include: Having a personal history of heart disease, diabetes, high cholesterol, or kidney disease. Stress. Having a family history of high blood pressure and high cholesterol. Having obstructive sleep apnea. Age. The risk increases with age. What are the signs or symptoms? High blood pressure may not cause symptoms. Very high blood pressure (hypertensive crisis) may cause: Headache. Fast or irregular heartbeats (palpitations). Shortness of breath. Nosebleed. Nausea and vomiting. Vision changes. Severe chest pain, dizziness, and seizures. How is this diagnosed? This condition is diagnosed by  measuring your blood pressure while you are seated, with your arm resting on a flat surface, your legs uncrossed, and your feet flat on the floor. The cuff of the blood pressure monitor will be placed directly against the skin of your upper arm at the level of your heart. Blood pressure should be measured at least twice using the same arm. Certain conditions can cause a difference in blood pressure between your right and left arms. If you have a high blood pressure reading during one visit or you have normal blood pressure with other risk factors, you may be asked to: Return on a different day to have your blood pressure checked again. Monitor your blood pressure at home for 1 week or longer. If you are diagnosed with hypertension, you may have other blood or imaging tests to help your health care provider understand your overall risk for other conditions. How is this treated? This condition is treated by making healthy lifestyle changes, such as eating healthy foods, exercising more, and reducing your alcohol intake. You may be referred for counseling on a healthy diet and physical activity. Your health care provider may prescribe medicine if lifestyle changes are not enough to get your blood pressure under control and if: Your systolic blood pressure is above 130. Your diastolic blood pressure is above 80. Your personal target blood pressure may vary depending on your medical conditions, your age, and other factors. Follow these instructions at home: Eating and drinking  Eat a diet that is high in fiber and potassium, and low in sodium, added sugar, and fat. An example of this eating plan is called the DASH diet. DASH stands for Dietary Approaches to Stop Hypertension. To eat this way: Eat   plenty of fresh fruits and vegetables. Try to fill one half of your plate at each meal with fruits and vegetables. Eat whole grains, such as whole-wheat pasta, brown rice, or whole-grain bread. Fill about one  fourth of your plate with whole grains. Eat or drink low-fat dairy products, such as skim milk or low-fat yogurt. Avoid fatty cuts of meat, processed or cured meats, and poultry with skin. Fill about one fourth of your plate with lean proteins, such as fish, chicken without skin, beans, eggs, or tofu. Avoid pre-made and processed foods. These tend to be higher in sodium, added sugar, and fat. Reduce your daily sodium intake. Many people with hypertension should eat less than 1,500 mg of sodium a day. Do not drink alcohol if: Your health care provider tells you not to drink. You are pregnant, may be pregnant, or are planning to become pregnant. If you drink alcohol: Limit how much you have to: 0-1 drink a day for women. 0-2 drinks a day for men. Know how much alcohol is in your drink. In the [place] one drink equals one 12 oz bottle of beer (355 mL), one 5 oz glass of wine (148 mL), or one 1 oz glass of hard liquor (44 mL). Lifestyle  Work with your health care provider to maintain a healthy body weight or to lose weight. Ask what an ideal weight is for you. Get at least 30 minutes of exercise that causes your heart to beat faster (aerobic exercise) most days of the week. Activities may include walking, swimming, or biking. Include exercise to strengthen your muscles (resistance exercise), such as Pilates or lifting weights, as part of your weekly exercise routine. Try to do these types of exercises for 30 minutes at least 3 days a week. Do not use any products that contain nicotine or tobacco. These products include cigarettes, chewing tobacco, and vaping devices, such as e-cigarettes. If you need help quitting, ask your health care provider. Monitor your blood pressure at home as told by your health care provider. Keep all follow-up visits. This is important. Medicines Take over-the-counter and prescription medicines only as told by your health care provider. Follow directions carefully. Blood  pressure medicines must be taken as prescribed. Do not skip doses of blood pressure medicine. Doing this puts you at risk for problems and can make the medicine less effective. Ask your health care provider about side effects or reactions to medicines that you should watch for. Contact a health care provider if you: Think you are having a reaction to a medicine you are taking. Have headaches that keep coming back (recurring). Feel dizzy. Have swelling in your ankles. Have trouble with your vision. Get help right away if you: Develop a severe headache or confusion. Have unusual weakness or numbness. Feel faint. Have severe pain in your chest or abdomen. Vomit repeatedly. Have trouble breathing. These symptoms may be an emergency. Get help right away. Call 911. Do not wait to see if the symptoms will go away. Do not drive yourself to the hospital. Summary Hypertension is when the force of blood pumping through your arteries is too strong. If this condition is not controlled, it may put you at risk for serious complications. Your personal target blood pressure may vary depending on your medical conditions, your age, and other factors. For most people, a normal blood pressure is less than 120/80. Hypertension is treated with lifestyle changes, medicines, or a combination of both. Lifestyle changes include losing weight, eating a healthy,   low-sodium diet, exercising more, and limiting alcohol. This information is not intended to replace advice given to you by your health care provider. Make sure you discuss any questions you have with your health care provider. Document Revised: 04/22/2021 Document Reviewed: 04/22/2021 Elsevier Patient Education  2023 [employer]  

## 2022-04-30 ENCOUNTER — Ambulatory Visit (INDEPENDENT_AMBULATORY_CARE_PROVIDER_SITE_OTHER): Payer: Medicare Other | Admitting: *Deleted

## 2022-04-30 DIAGNOSIS — E538 Deficiency of other specified B group vitamins: Secondary | ICD-10-CM | POA: Diagnosis not present

## 2022-04-30 MED ORDER — CYANOCOBALAMIN 1000 MCG/ML IJ SOLN
1000.0000 ug | Freq: Once | INTRAMUSCULAR | Status: AC
  Administered 2022-04-30: 1000 ug via INTRAMUSCULAR

## 2022-04-30 NOTE — Progress Notes (Signed)
Administered B12 shot left deltoid. Pt tolerated well.

## 2022-06-01 ENCOUNTER — Ambulatory Visit (INDEPENDENT_AMBULATORY_CARE_PROVIDER_SITE_OTHER): Payer: Medicare Other

## 2022-06-01 DIAGNOSIS — E538 Deficiency of other specified B group vitamins: Secondary | ICD-10-CM

## 2022-06-01 MED ORDER — CYANOCOBALAMIN 1000 MCG/ML IJ SOLN
1000.0000 ug | Freq: Once | INTRAMUSCULAR | Status: AC
  Administered 2022-06-01: 1000 ug via INTRAMUSCULAR

## 2022-06-01 NOTE — Progress Notes (Signed)
After obtaining consent, and per orders of Dr. Jones, injection of B12 was given in the left deltoid by Imya Mance P Lawarence Meek. Patient instructed to report any adverse reaction to me immediately.  

## 2022-06-22 ENCOUNTER — Other Ambulatory Visit: Payer: Self-pay | Admitting: Internal Medicine

## 2022-06-22 DIAGNOSIS — E785 Hyperlipidemia, unspecified: Secondary | ICD-10-CM

## 2022-07-02 ENCOUNTER — Ambulatory Visit (INDEPENDENT_AMBULATORY_CARE_PROVIDER_SITE_OTHER): Payer: Medicare Other | Admitting: *Deleted

## 2022-07-02 DIAGNOSIS — E538 Deficiency of other specified B group vitamins: Secondary | ICD-10-CM | POA: Diagnosis not present

## 2022-07-02 MED ORDER — CYANOCOBALAMIN 1000 MCG/ML IJ SOLN
1000.0000 ug | Freq: Once | INTRAMUSCULAR | Status: AC
  Administered 2022-07-02: 1000 ug via INTRAMUSCULAR

## 2022-07-02 NOTE — Progress Notes (Signed)
Pls cosign for B12 inj../lmb  

## 2022-07-20 NOTE — Progress Notes (Signed)
Name: Sandra Wood  Age/ Sex: 67 y.o., female   MRN/ DOB: [id number], 25-Jun-1956     PCP: Janith Lima, MD   Reason for Endocrinology Evaluation: Type 2 Diabetes Mellitus  Initial Endocrine Consultative Visit: 11/07/2018    PATIENT IDENTIFIER: Ms. Sandra Wood is a 67 y.o. female with a past medical history of HTN, T2DM, Asthma, Hx of Breast Ca (2018). The patient has followed with Endocrinology clinic since 11/07/2018 for consultative assistance with management of her diabetes.  DIABETIC HISTORY:  Sandra Wood was diagnosed with T2DM in 2013. Pt was on diet control until 08/2018 when she was started on Metformin, Rybelsus and Insulin. Her hemoglobin A1c has ranged from 6.6% in 2013, peaking at 12.5% in 2020  On her initial visit to our clinic her A1c was 8.8 % . She was on Rybelsus, tresiba and Metformin  Stopped Metformin 10/2020 due to GI side effects  Attempted to start Jardiance through patient assistance program in January 2023  that she did not fill out   SUBJECTIVE:   During the last visit (01/15/2022): A1c 8.3 %      Today (07/21/2022): Sandra Wood is here for a follow up on her diabetes management.  She has not been checking glucose.   She follows with oncology for hx of breast ca  She hurt her back and has been in pain   Denies vomiting or diarrhea    HOME DIABETES REGIMEN:  Basaglar  46 units daily  Rybelsus 14 mg daily        DIABETIC COMPLICATIONS: Microvascular complications:  neuropathy Denies: CKD,  Last eye exam: Completed 2022  Macrovascular complications:   Denies: CAD, PVD, CVA      HISTORY:  Past Medical History:  Past Medical History:  Diagnosis Date   Abnormal Pap smear of cervix 2009   Allergic rhinitis, cause unspecified    Arthritis    Arthritis of ankle BILATERAL / HX FX'S   Asthma, mild persistent    Breast cancer ([place])    Dyspnea    with much activity   Fibroids 2008   GERD (gastroesophageal reflux  disease)    not current   History of radiation therapy 01/27/17-03/16/17   left breast 1.8 Gy in 28 fractions, left breast boost 2 Gy in 6 fractions   HSV-2 (herpes simplex virus 2) infection    at age 52   Malignant neoplasm of lower-inner quadrant of left breast in female, estrogen receptor positive ([place]) 08/20/2016   Obesity, morbid ([place])    Personal history of chemotherapy    Personal history of radiation therapy    Pneumonia    2013, 2017   Tear of medial meniscus of knee LEFT   Type II or unspecified type diabetes mellitus without mention of complication, uncontrolled    Type II- has never been on medication   Past Surgical History:  Past Surgical History:  Procedure Laterality Date   BREAST BIOPSY     BREAST LUMPECTOMY Left 09/22/2016   BREAST LUMPECTOMY WITH RADIOACTIVE SEED AND SENTINEL LYMPH NODE BIOPSY Left 09/22/2016   Procedure: BREAST LUMPECTOMY WITH RADIOACTIVE SEED AND LEFT AXILLARY SENTINEL LYMPH NODE BIOPSY;  Surgeon: Alphonsa Overall, MD;  Location: [facility];  Service: General;  Laterality: Left;   CHONDROPLASTY  08/19/2011   Procedure: CHONDROPLASTY;  Surgeon: Magnus Sinning, MD;  Location: [place] [facility] [place];  Service: Orthopedics;;  shaving of medial femeral chondral   DILATION AND CURETTAGE OF UTERUS  HYSTEROSCOPY WITH D & C  2008   benign endometrial polyp - Dr. Maryelizabeth Rowan   KNEE ARTHROSCOPY W/ MENISCECTOMY Bilateral 07/07/2010   1 year apart   MENISECTOMY  08/19/2011   Procedure: MENISECTOMY;  Surgeon: Magnus Sinning, MD;  Location: [facility];  Service: Orthopedics;;  partial lateral menisectomy   PORT-A-CATH REMOVAL N/A 12/21/2017   Procedure: REMOVAL PORT-A-CATH;  Surgeon: Alphonsa Overall, MD;  Location: [place];  Service: General;  Laterality: N/A;   PORTACATH PLACEMENT N/A 09/22/2016   Procedure: INSERTION PORT-A-CATH;  Surgeon: Alphonsa Overall, MD;  Location: [place];  Service: General;  Laterality: N/A;   TONSILLECTOMY     TUBAL LIGATION   AGE 36   Social History:  reports that she quit smoking about 35 years ago. Her smoking use included cigarettes. She has never used smokeless tobacco. She reports that she does not currently use alcohol. She reports that she does not use drugs. Family History:  Family History  Problem Relation Age of Onset   Diabetes Mother    Heart disease Father    Cancer Brother 84       prostate   Breast cancer Maternal Grandmother 88     HOME MEDICATIONS: Allergies as of 07/21/2022       Reactions   Metformin And Related    GI upset    No Known Allergies         Medication List        Accurate as of July 21, 2022  8:36 AM. If you have any questions, ask your nurse or doctor.          STOP taking these medications    cyanocobalamin 1000 MCG/ML injection Commonly known as: VITAMIN B12 Stopped by: Dorita Sciara, MD       TAKE these medications    anastrozole 1 MG tablet Commonly known as: ARIMIDEX Take 1 tablet (1 mg total) by mouth daily.   Basaglar KwikPen 100 UNIT/ML Inject 46 Units into the skin daily.   BD Pen Needle Micro U/F 32G X 6 MM Misc Generic drug: Insulin Pen Needle 1 Device by Does not apply route daily in the afternoon.   BD Pen Needle Nano 2nd Gen 32G X 4 MM Misc Generic drug: Insulin Pen Needle INJECT 1 DEVICE INTO THE SKIN DAILY.   blood glucose meter kit and supplies Kit Dispense based on patient and insurance preference. Use up to four times daily as directed. (FOR ICD-9 250.00, 250.01). Check blood sugar BID.   clotrimazole 1 % cream Commonly known as: Clotrimazole AF Apply 1 application topically 2 (two) times daily. Use for 2 weeks as needed.   losartan 50 MG tablet Commonly known as: COZAAR Take 1 tablet (50 mg total) by mouth daily.   nystatin powder Commonly known as: MYCOSTATIN/NYSTOP Apply topically 3 (three) times daily. Apply to affected area for up to 7 days   OneTouch Verio test strip Generic drug: glucose  blood Use as instructed   rosuvastatin 20 MG tablet Commonly known as: CRESTOR TAKE 1 TABLET BY MOUTH EVERY DAY   Rybelsus 14 MG Tabs Generic drug: Semaglutide Take 1 tablet by mouth daily with breakfast.         OBJECTIVE:   Vital Signs: BP 130/82 (BP Location: Right Wrist, Patient Position: Sitting, Cuff Size: Large)   Pulse 81   Ht '5\' 3"'$  (1.6 m)   Wt 291 lb 6.4 oz (132.2 kg)   LMP 06/30/2003   SpO2 95%  BMI 51.62 kg/m   Wt Readings from Last 3 Encounters:  07/21/22 291 lb 6.4 oz (132.2 kg)  04/15/22 286 lb 8 oz (130 kg)  03/03/22 284 lb 4.8 oz (129 kg)     Exam: General: Pt appears well and is in NAD  Lungs: Clear with good BS bilat   Heart: RRR   Extremities: No pretibial edema.   Neuro: MS is good with appropriate affect, pt is alert and Ox3       DM foot exam: 07/21/2022    The skin of the feet is intact without sores or ulcerations.Plantar callous formation noted bilaterally The pedal pulses are 2+ on right and 2+ on left. The sensation is intact to a screening 5.07, 10 gram monofilament bilaterally at the heels       DATA REVIEWED:  Lab Results  Component Value Date   HGBA1C 8.4 Repeated and verified X2. (H) 04/15/2022   HGBA1C 8.3 (A) 01/15/2022   HGBA1C 7.8 (A) 07/17/2021    Latest Reference Range & Units 04/15/22 08:51  Sodium 135 - 145 mEq/L 137  Potassium 3.5 - 5.1 mEq/L 3.9  Chloride 96 - 112 mEq/L 103  CO2 19 - 32 mEq/L 26  Glucose 70 - 99 mg/dL 180 (H)  BUN 6 - 23 mg/dL 14  Creatinine 0.40 - 1.20 mg/dL 0.69  Calcium 8.4 - 10.5 mg/dL 9.4  Alkaline Phosphatase 39 - 117 U/L 133 (H)  Albumin 3.5 - 5.2 g/dL 4.0  AST 0 - 37 U/L 13  ALT 0 - 35 U/L 11  Total Protein 6.0 - 8.3 g/dL 7.8  Bilirubin, Direct 0.0 - 0.3 mg/dL 0.1  Total Bilirubin 0.2 - 1.2 mg/dL 0.6  GFR >60.00 mL/min 89.72    Latest Reference Range & Units 04/15/22 08:51  Total CHOL/HDL Ratio  3  Cholesterol 0 - 200 mg/dL 130  HDL Cholesterol >39.00 mg/dL 46.20  LDL  (calc) 0 - 99 mg/dL 56  MICROALB/CREAT RATIO 0.0 - 30.0 mg/g 0.9  NonHDL  83.40  Triglycerides 0.0 - 149.0 mg/dL 136.0  VLDL 0.0 - 40.0 mg/dL 27.2   In office BG 149 mg/dL    ASSESSMENT / PLAN / RECOMMENDATIONS:   1) Type 2 Diabetes Mellitus, Poorly controlled, With Neuropathic complications - Most recent A1c of 8.0 %. Goal A1c < 7.0 %.    - A1c trending down but remains above goal  - Intolerant to  metformin due to GI issues - CGM cost prohibitive  - Jardiance cost-prohibitive , she was provided with pt assistance forms in 06/2021 but she did not fill out, I offered to prescribe it again today 06/2022 but she would prefer to increase her insulin at this time   MEDICATIONS: - Increase  Basaglar 50 Units daily  - Continue Rybelsus 14 mg daily     EDUCATION / INSTRUCTIONS: BG monitoring instructions: Patient is instructed to check her blood sugars 2 times a day, fasting and bedtime. Call [facility] if: BG persistently < 70 I reviewed the Rule of 15 for the treatment of hypoglycemia in detail with the patient. Literature supplied.   2) Dyslipidemia :  -LDL has been at goal  Medication Continue rosuvastatin 20 mg daily   F/U in 6 months    Signed electronically by: Mack Guise, MD  [facility]  [facility] [address] [place], [place] [address] Phone: [phone] FAX: [phone]   CC: Janith Lima, MD [place]  [id number] Phone: [phone]  Fax: [phone]  Return to Endocrinology clinic as below: Future Appointments  Date Time Provider [place]  07/21/2022  8:50 AM Rulon Abdalla, Melanie Crazier, MD [facility] None  08/03/2022  9:20 AM [facility] NURSE [facility] None  08/27/2022  8:20 AM Janith Lima, MD [facility] None  08/27/2022  3:40 PM [facility] 1 [facility] CE  03/04/2023  9:30 AM Nicholas Lose, MD [facility] None

## 2022-07-21 ENCOUNTER — Ambulatory Visit (INDEPENDENT_AMBULATORY_CARE_PROVIDER_SITE_OTHER): Payer: Medicare Other | Admitting: Internal Medicine

## 2022-07-21 ENCOUNTER — Encounter: Payer: Self-pay | Admitting: Internal Medicine

## 2022-07-21 VITALS — BP 130/82 | HR 81 | Ht 63.0 in | Wt 291.4 lb

## 2022-07-21 DIAGNOSIS — E1142 Type 2 diabetes mellitus with diabetic polyneuropathy: Secondary | ICD-10-CM | POA: Diagnosis not present

## 2022-07-21 DIAGNOSIS — E785 Hyperlipidemia, unspecified: Secondary | ICD-10-CM

## 2022-07-21 DIAGNOSIS — E1165 Type 2 diabetes mellitus with hyperglycemia: Secondary | ICD-10-CM

## 2022-07-21 DIAGNOSIS — Z794 Long term (current) use of insulin: Secondary | ICD-10-CM

## 2022-07-21 LAB — POCT GLUCOSE (DEVICE FOR HOME USE): POC Glucose: 149 mg/dl — AB (ref 70–99)

## 2022-07-21 LAB — POCT GLYCOSYLATED HEMOGLOBIN (HGB A1C)

## 2022-07-21 MED ORDER — BASAGLAR KWIKPEN 100 UNIT/ML ~~LOC~~ SOPN
50.0000 [IU] | PEN_INJECTOR | Freq: Every day | SUBCUTANEOUS | 4 refills | Status: DC
Start: 2022-07-21 — End: 2023-01-28

## 2022-07-21 MED ORDER — ROSUVASTATIN CALCIUM 20 MG PO TABS: 20.0000 mg | ORAL_TABLET | Freq: Every day | ORAL | 3 refills | Status: DC

## 2022-07-21 MED ORDER — BD PEN NEEDLE MICRO U/F 32G X 6 MM MISC: 1.0000 | Freq: Every day | 3 refills | Status: DC

## 2022-07-21 MED ORDER — RYBELSUS 14 MG PO TABS: 1.0000 | ORAL_TABLET | Freq: Every day | ORAL | 3 refills | Status: DC

## 2022-07-21 NOTE — Patient Instructions (Signed)
-  Increase Basaglar 50 Units daily  - Continue Rybelsus 14 mg, 1 tablet daily      HOW TO TREAT LOW BLOOD SUGARS (Blood sugar LESS THAN 70 MG/DL) Please follow the RULE OF 15 for the treatment of hypoglycemia treatment (when your (blood sugars are less than 70 mg/dL)   STEP 1: Take 15 grams of carbohydrates when your blood sugar is low, which includes:  3-4 GLUCOSE TABS  OR 3-4 OZ OF JUICE OR REGULAR SODA OR ONE TUBE OF GLUCOSE GEL    STEP 2: RECHECK blood sugar in 15 MINUTES STEP 3: If your blood sugar is still low at the 15 minute recheck --> then, go back to STEP 1 and treat AGAIN with another 15 grams of carbohydrates.

## 2022-08-03 ENCOUNTER — Ambulatory Visit (INDEPENDENT_AMBULATORY_CARE_PROVIDER_SITE_OTHER): Payer: Medicare Other

## 2022-08-03 DIAGNOSIS — E538 Deficiency of other specified B group vitamins: Secondary | ICD-10-CM | POA: Diagnosis not present

## 2022-08-03 MED ORDER — CYANOCOBALAMIN 1000 MCG/ML IJ SOLN
1000.0000 ug | Freq: Once | INTRAMUSCULAR | Status: AC
  Administered 2022-08-03: 1000 ug via INTRAMUSCULAR

## 2022-08-03 NOTE — Progress Notes (Signed)
After obtaining consent, and per orders of Dr. Jones, injection of B12 given by Audryanna Zurita P Dazja Houchin. Patient instructed to report any adverse reaction to me immediately.  

## 2022-08-27 ENCOUNTER — Ambulatory Visit (INDEPENDENT_AMBULATORY_CARE_PROVIDER_SITE_OTHER): Payer: Medicare Other | Admitting: Internal Medicine

## 2022-08-27 ENCOUNTER — Encounter: Payer: Self-pay | Admitting: Internal Medicine

## 2022-08-27 VITALS — BP 136/76 | HR 82 | Temp 98.0°F | Resp 16 | Ht 63.0 in | Wt 291.0 lb

## 2022-08-27 DIAGNOSIS — R0609 Other forms of dyspnea: Secondary | ICD-10-CM | POA: Diagnosis not present

## 2022-08-27 DIAGNOSIS — E1142 Type 2 diabetes mellitus with diabetic polyneuropathy: Secondary | ICD-10-CM | POA: Diagnosis not present

## 2022-08-27 DIAGNOSIS — E538 Deficiency of other specified B group vitamins: Secondary | ICD-10-CM

## 2022-08-27 DIAGNOSIS — I10 Essential (primary) hypertension: Secondary | ICD-10-CM

## 2022-08-27 LAB — BASIC METABOLIC PANEL
BUN: 16 mg/dL (ref 6–23)
CO2: 27 mEq/L (ref 19–32)
Calcium: 9.7 mg/dL (ref 8.4–10.5)
Chloride: 102 mEq/L (ref 96–112)
Creatinine, Ser: 0.71 mg/dL (ref 0.40–1.20)
GFR: 87.68 mL/min (ref >60.00)
Glucose, Bld: 145 mg/dL — ABNORMAL HIGH (ref 70–99)
Potassium: 4.4 mEq/L (ref 3.5–5.1)
Sodium: 139 mEq/L (ref 135–145)

## 2022-08-27 LAB — BRAIN NATRIURETIC PEPTIDE: Pro B Natriuretic peptide (BNP): 3 pg/mL (ref 0.0–100.0)

## 2022-08-27 LAB — CBC WITH DIFFERENTIAL/PLATELET
Basophils Absolute: 0.1 10*3/uL (ref 0.0–0.1)
Basophils Relative: 0.5 % (ref 0.0–3.0)
Eosinophils Absolute: 0.3 10*3/uL (ref 0.0–0.7)
Eosinophils Relative: 2.9 % (ref 0.0–5.0)
HCT: 43.4 % (ref 36.0–46.0)
Hemoglobin: 14.4 g/dL (ref 12.0–15.0)
Lymphocytes Relative: 20.3 % (ref 12.0–46.0)
Lymphs Abs: 2.1 10*3/uL (ref 0.7–4.0)
MCHC: 33.3 g/dL (ref 30.0–36.0)
MCV: 81.6 fl (ref 78.0–100.0)
Monocytes Absolute: 0.8 10*3/uL (ref 0.1–1.0)
Monocytes Relative: 8.1 % (ref 3.0–12.0)
Neutro Abs: 7.1 10*3/uL (ref 1.4–7.7)
Neutrophils Relative %: 68.2 % (ref 43.0–77.0)
Platelets: 360 10*3/uL (ref 150.0–400.0)
RBC: 5.31 Mil/uL — ABNORMAL HIGH (ref 3.87–5.11)
RDW: 15.3 % (ref 11.5–15.5)
WBC: 10.4 10*3/uL (ref 4.0–10.5)

## 2022-08-27 LAB — TROPONIN I (HIGH SENSITIVITY): High Sens Troponin I: 6 ng/L (ref 2–17)

## 2022-08-27 LAB — HEMOGLOBIN A1C: Hgb A1c MFr Bld: 8.2 % — ABNORMAL HIGH (ref 4.6–6.5)

## 2022-08-27 MED ORDER — EMPAGLIFLOZIN 25 MG PO TABS: 25.0000 mg | ORAL_TABLET | Freq: Every day | ORAL | 1 refills | Status: DC

## 2022-08-27 NOTE — Progress Notes (Signed)
Subjective:  Patient ID: Sandra Wood, female    DOB: 1955/12/19  Age: 67 y.o. MRN: [id number]  CC: Hypertension and Diabetes   HPI Sandra Wood presents for f/up ---  She complains of dyspnea on exertion when she walks quickly.  She denies chest pain, diaphoresis, or palpitations.  Outpatient Medications Prior to Visit  Medication Sig Dispense Refill   anastrozole (ARIMIDEX) 1 MG tablet Take 1 tablet (1 mg total) by mouth daily. 90 tablet 3   BD PEN NEEDLE NANO 2ND GEN 32G X 4 MM MISC INJECT 1 DEVICE INTO THE SKIN DAILY. 100 each 3   blood glucose meter kit and supplies KIT Dispense based on patient and insurance preference. Use up to four times daily as directed. (FOR ICD-9 250.00, 250.01). Check blood sugar BID. 1 each 0   glucose blood (ONETOUCH VERIO) test strip Use as instructed 100 each 12   Insulin Glargine (BASAGLAR KWIKPEN) 100 UNIT/ML Inject 50 Units into the skin daily. 45 mL 4   Insulin Pen Needle (BD PEN NEEDLE MICRO U/F) 32G X 6 MM MISC 1 Device by Does not apply route daily in the afternoon. 100 each 3   losartan (COZAAR) 50 MG tablet Take 1 tablet (50 mg total) by mouth daily. 90 tablet 1   rosuvastatin (CRESTOR) 20 MG tablet Take 1 tablet (20 mg total) by mouth daily. 90 tablet 3   Semaglutide (RYBELSUS) 14 MG TABS Take 1 tablet (14 mg total) by mouth daily with breakfast. 90 tablet 3   clotrimazole (CLOTRIMAZOLE AF) 1 % cream Apply 1 application topically 2 (two) times daily. Use for 2 weeks as needed. 30 g 1   nystatin (MYCOSTATIN/NYSTOP) powder Apply topically 3 (three) times daily. Apply to affected area for up to 7 days 45 g 1   No facility-administered medications prior to visit.    ROS Review of Systems  Constitutional:  Negative for appetite change, chills, diaphoresis, fatigue and unexpected weight change.  HENT: Negative.    Eyes: Negative.   Respiratory:  Positive for shortness of breath. Negative for chest tightness and wheezing.    Cardiovascular:  Negative for chest pain, palpitations and leg swelling.  Gastrointestinal:  Negative for abdominal pain, constipation, diarrhea, nausea and vomiting.  Endocrine: Negative.   Genitourinary: Negative.  Negative for difficulty urinating and dysuria.  Musculoskeletal: Negative.   Skin: Negative.   Neurological:  Negative for dizziness and weakness.  Hematological:  Negative for adenopathy. Does not bruise/bleed easily.  Psychiatric/Behavioral: Negative.      Objective:  BP 136/76 (BP Location: Left Arm, Patient Position: Sitting, Cuff Size: Large)   Pulse 82   Temp 98 F (36.7 C) (Oral)   Resp 16   Ht '5\' 3"'$  (1.6 m)   Wt 291 lb (132 kg)   LMP 06/30/2003   SpO2 91%   BMI 51.55 kg/m   BP Readings from Last 3 Encounters:  08/27/22 136/76  07/21/22 130/82  04/15/22 132/74    Wt Readings from Last 3 Encounters:  08/27/22 291 lb (132 kg)  07/21/22 291 lb 6.4 oz (132.2 kg)  04/15/22 286 lb 8 oz (130 kg)    Physical Exam Vitals reviewed.  Constitutional:      Appearance: She is obese.  HENT:     Mouth/Throat:     Mouth: Mucous membranes are moist.  Eyes:     General: No scleral icterus.    Conjunctiva/sclera: Conjunctivae normal.  Cardiovascular:     Rate and Rhythm: Normal  rate and regular rhythm.     Heart sounds: No murmur heard.    No gallop.  Pulmonary:     Effort: Pulmonary effort is normal.     Breath sounds: No stridor. No wheezing, rhonchi or rales.  Abdominal:     General: Abdomen is protuberant. Bowel sounds are normal. There is no distension.     Palpations: Abdomen is soft. There is no hepatomegaly or mass.     Tenderness: There is no abdominal tenderness.  Musculoskeletal:        General: Normal range of motion.     Cervical back: Neck supple.     Right lower leg: No edema.     Left lower leg: No edema.  Lymphadenopathy:     Cervical: No cervical adenopathy.  Skin:    General: Skin is warm and dry.  Neurological:     General: No  focal deficit present.     Mental Status: She is alert.  Psychiatric:        Mood and Affect: Mood normal.        Thought Content: Thought content normal.     Lab Results  Component Value Date   WBC 10.4 08/27/2022   HGB 14.4 08/27/2022   HCT 43.4 08/27/2022   PLT 360.0 08/27/2022   GLUCOSE 145 (H) 08/27/2022   CHOL 130 04/15/2022   TRIG 136.0 04/15/2022   HDL 46.20 04/15/2022   LDLDIRECT 177.0 09/13/2018   LDLCALC 56 04/15/2022   ALT 11 04/15/2022   AST 13 04/15/2022   NA 139 08/27/2022   K 4.4 08/27/2022   CL 102 08/27/2022   CREATININE 0.71 08/27/2022   BUN 16 08/27/2022   CO2 27 08/27/2022   TSH 3.98 04/15/2022   INR 1.1 RATIO 07/09/2006   HGBA1C 8.2 (H) 08/27/2022   MICROALBUR 2.5 (H) 04/15/2022    MM Digital Diagnostic Unilat L  Result Date: 02/23/2022 CLINICAL DATA:  History of left breast cancer status post lumpectomy in 2018. Patient was recalled from screening mammogram for left breast calcifications. EXAM: DIGITAL DIAGNOSTIC UNILATERAL LEFT MAMMOGRAM WITH CAD TECHNIQUE: Left digital diagnostic mammography was performed. COMPARISON:  Previous exam(s). ACR Breast Density Category c: The breast tissue is heterogeneously dense, which may obscure small masses. FINDINGS: Additional imaging of the left breast was performed. There are round punctate calcifications as well as dystrophic appearing calcifications in the lumpectomy site. The calcifications are all felt to likely be benign. IMPRESSION: Probable benign calcifications in the left breast. RECOMMENDATION: Short-term interval follow-up left mammogram in 6 months is recommended. I have discussed the findings and recommendations with the patient. If applicable, a reminder letter will be sent to the patient regarding the next appointment. BI-RADS CATEGORY  3: Probably benign. Electronically Signed   By: Lillia Mountain M.D.   On: 02/23/2022 16:48   Assessment & Plan:   Sandra Wood was seen today for hypertension and  diabetes.  Diagnoses and all orders for this visit:  Essential hypertension- Her blood pressure is adequately well-controlled. -     Basic metabolic panel; Future -     CBC with Differential/Platelet; Future -     CBC with Differential/Platelet -     Basic metabolic panel  Vitamin 123456 deficiency neuropathy (HCC) -     CBC with Differential/Platelet; Future -     CBC with Differential/Platelet  Type 2 diabetes mellitus with diabetic polyneuropathy, without long-term current use of insulin ([place])- Her A1c is up to 8.2%.  Will add an SGLT2 inhibitor. -  Basic metabolic panel; Future -     Hemoglobin A1c; Future -     Hemoglobin A1c -     Basic metabolic panel -     empagliflozin (JARDIANCE) 25 MG TABS tablet; Take 1 tablet (25 mg total) by mouth daily before breakfast.  DOE (dyspnea on exertion)- Labs are normal.  I have asked her to undergo a coronary calcium score to gauge her risk for coronary artery disease. -     CBC with Differential/Platelet; Future -     Troponin I (High Sensitivity); Future -     Brain natriuretic peptide; Future -     Cancel: CT CARDIAC SCORING (SELF PAY ONLY); Future -     Brain natriuretic peptide -     Troponin I (High Sensitivity) -     CBC with Differential/Platelet -     CT CARDIAC SCORING ([facility] LOCATIONS ONLY); Future   I am having Sandra Wood [employer]" start on empagliflozin. I am also having her maintain her blood glucose meter kit and supplies, OneTouch Verio, nystatin, clotrimazole, anastrozole, BD Pen Needle Nano 2nd Gen, losartan, Basaglar KwikPen, BD Pen Needle Micro U/F, Rybelsus, and rosuvastatin.  Meds ordered this encounter  Medications   empagliflozin (JARDIANCE) 25 MG TABS tablet    Sig: Take 1 tablet (25 mg total) by mouth daily before breakfast.    Dispense:  90 tablet    Refill:  1     Follow-up: Return in about 6 months (around 02/25/2023).  Scarlette Calico, MD

## 2022-08-27 NOTE — Patient Instructions (Signed)

## 2022-08-28 ENCOUNTER — Ambulatory Visit
Admission: RE | Admit: 2022-08-28 | Discharge: 2022-08-28 | Disposition: A | Discharge status: Home / Self Care | Payer: Medicare Other | Source: Ambulatory Visit | Attending: Hematology and Oncology | Admitting: Hematology and Oncology

## 2022-08-28 DIAGNOSIS — R921 Mammographic calcification found on diagnostic imaging of breast: Secondary | ICD-10-CM

## 2022-09-07 ENCOUNTER — Ambulatory Visit (INDEPENDENT_AMBULATORY_CARE_PROVIDER_SITE_OTHER): Payer: Medicare Other | Admitting: *Deleted

## 2022-09-07 DIAGNOSIS — G63 Polyneuropathy in diseases classified elsewhere: Secondary | ICD-10-CM | POA: Diagnosis not present

## 2022-09-07 DIAGNOSIS — E538 Deficiency of other specified B group vitamins: Secondary | ICD-10-CM | POA: Diagnosis not present

## 2022-09-07 MED ORDER — CYANOCOBALAMIN 1000 MCG/ML IJ SOLN
1000.0000 ug | Freq: Once | INTRAMUSCULAR | Status: AC
  Administered 2022-09-07: 1000 ug via INTRAMUSCULAR

## 2022-09-07 NOTE — Progress Notes (Signed)
Pls cosign for B12 inj../lmb  

## 2022-09-14 ENCOUNTER — Other Ambulatory Visit: Payer: Self-pay | Admitting: Hematology and Oncology

## 2022-09-14 DIAGNOSIS — R921 Mammographic calcification found on diagnostic imaging of breast: Secondary | ICD-10-CM

## 2022-09-25 ENCOUNTER — Other Ambulatory Visit: Payer: Medicare Other

## 2022-09-29 ENCOUNTER — Ambulatory Visit
Admission: RE | Admit: 2022-09-29 | Discharge: 2022-09-29 | Disposition: A | Discharge status: Home / Self Care | Payer: Medicare Other | Source: Ambulatory Visit | Attending: Internal Medicine | Admitting: Internal Medicine

## 2022-09-29 DIAGNOSIS — R0609 Other forms of dyspnea: Secondary | ICD-10-CM

## 2022-10-09 ENCOUNTER — Ambulatory Visit (INDEPENDENT_AMBULATORY_CARE_PROVIDER_SITE_OTHER): Payer: Medicare Other

## 2022-10-09 DIAGNOSIS — E538 Deficiency of other specified B group vitamins: Secondary | ICD-10-CM

## 2022-10-09 DIAGNOSIS — G63 Polyneuropathy in diseases classified elsewhere: Secondary | ICD-10-CM

## 2022-10-09 MED ORDER — CYANOCOBALAMIN 1000 MCG/ML IJ SOLN
1000.0000 ug | Freq: Once | INTRAMUSCULAR | Status: AC
  Administered 2022-10-09: 1000 ug via INTRAMUSCULAR

## 2022-10-09 NOTE — Progress Notes (Signed)
Pt was given B12 w/o any complications. 

## 2022-10-25 ENCOUNTER — Other Ambulatory Visit: Payer: Self-pay | Admitting: Internal Medicine

## 2022-10-25 DIAGNOSIS — I10 Essential (primary) hypertension: Secondary | ICD-10-CM

## 2022-10-25 DIAGNOSIS — I119 Hypertensive heart disease without heart failure: Secondary | ICD-10-CM

## 2022-10-26 ENCOUNTER — Telehealth: Payer: Self-pay

## 2022-10-26 NOTE — Telephone Encounter (Signed)
Called patient to schedule Medicare Annual Wellness Visit (AWV). No voicemail available to leave a message.  Last date of AWV: 10/15/21  Please schedule an appointment at any time On Annual Wellness Visit Schedule.

## 2022-10-26 NOTE — Telephone Encounter (Signed)
Contacted Sandra Wood to schedule their annual wellness visit. Appointment made for 11/11/22.  Agnes Lawrence, CMA (AAMA)  [facility]- AWV Program [phone]

## 2022-11-11 ENCOUNTER — Ambulatory Visit (INDEPENDENT_AMBULATORY_CARE_PROVIDER_SITE_OTHER): Payer: Medicare Other | Admitting: *Deleted

## 2022-11-11 DIAGNOSIS — E538 Deficiency of other specified B group vitamins: Secondary | ICD-10-CM

## 2022-11-11 MED ORDER — CYANOCOBALAMIN 1000 MCG/ML IJ SOLN
1000.0000 ug | Freq: Once | INTRAMUSCULAR | Status: AC
Start: 2022-11-11 — End: 2022-11-11
  Administered 2022-11-11: 1000 ug via INTRAMUSCULAR

## 2022-11-11 NOTE — Progress Notes (Signed)
Pls cosign for B12 inj../lmb  

## 2022-12-14 ENCOUNTER — Ambulatory Visit (INDEPENDENT_AMBULATORY_CARE_PROVIDER_SITE_OTHER): Payer: Medicare Other

## 2022-12-14 DIAGNOSIS — E538 Deficiency of other specified B group vitamins: Secondary | ICD-10-CM

## 2022-12-14 MED ORDER — CYANOCOBALAMIN 1000 MCG/ML IJ SOLN
1000.0000 ug | Freq: Once | INTRAMUSCULAR | Status: AC
Start: 2022-12-14 — End: 2022-12-14
  Administered 2022-12-14: 1000 ug via INTRAMUSCULAR

## 2022-12-14 NOTE — Progress Notes (Signed)
After obtaining consent, and per orders of Dr. Jones, injection of B12 given by Anida Deol P Demara Lover. Patient instructed to report any adverse reaction to me immediately.  

## 2023-01-13 ENCOUNTER — Ambulatory Visit: Payer: Medicare Other

## 2023-01-18 ENCOUNTER — Ambulatory Visit (INDEPENDENT_AMBULATORY_CARE_PROVIDER_SITE_OTHER): Payer: Medicare Other

## 2023-01-18 DIAGNOSIS — E538 Deficiency of other specified B group vitamins: Secondary | ICD-10-CM | POA: Diagnosis not present

## 2023-01-18 DIAGNOSIS — G63 Polyneuropathy in diseases classified elsewhere: Secondary | ICD-10-CM | POA: Diagnosis not present

## 2023-01-18 MED ORDER — CYANOCOBALAMIN 1000 MCG/ML IJ SOLN
1000.0000 ug | Freq: Once | INTRAMUSCULAR | Status: AC
Start: 2023-01-18 — End: 2023-01-18
  Administered 2023-01-18: 1000 ug via INTRAMUSCULAR

## 2023-01-18 NOTE — Progress Notes (Signed)
Patient here for monthly B12 injection per Dr. Jones.  B12 1000 mcg given in left IM and patient tolerated injection well today.  

## 2023-01-27 ENCOUNTER — Encounter (INDEPENDENT_AMBULATORY_CARE_PROVIDER_SITE_OTHER): Payer: Self-pay

## 2023-01-28 ENCOUNTER — Encounter: Payer: Self-pay | Admitting: Internal Medicine

## 2023-01-28 ENCOUNTER — Ambulatory Visit (INDEPENDENT_AMBULATORY_CARE_PROVIDER_SITE_OTHER): Payer: Medicare Other

## 2023-01-28 ENCOUNTER — Ambulatory Visit (INDEPENDENT_AMBULATORY_CARE_PROVIDER_SITE_OTHER): Payer: Medicare Other | Admitting: Internal Medicine

## 2023-01-28 VITALS — BP 118/74 | HR 92 | Ht 63.0 in | Wt 292.4 lb

## 2023-01-28 VITALS — Ht 64.0 in | Wt 292.0 lb

## 2023-01-28 DIAGNOSIS — Z794 Long term (current) use of insulin: Secondary | ICD-10-CM

## 2023-01-28 DIAGNOSIS — Z1211 Encounter for screening for malignant neoplasm of colon: Secondary | ICD-10-CM

## 2023-01-28 DIAGNOSIS — Z0000 Encounter for general adult medical examination without abnormal findings: Secondary | ICD-10-CM | POA: Diagnosis not present

## 2023-01-28 DIAGNOSIS — E118 Type 2 diabetes mellitus with unspecified complications: Secondary | ICD-10-CM

## 2023-01-28 DIAGNOSIS — E785 Hyperlipidemia, unspecified: Secondary | ICD-10-CM | POA: Diagnosis not present

## 2023-01-28 DIAGNOSIS — Z1212 Encounter for screening for malignant neoplasm of rectum: Secondary | ICD-10-CM | POA: Diagnosis not present

## 2023-01-28 DIAGNOSIS — E1165 Type 2 diabetes mellitus with hyperglycemia: Secondary | ICD-10-CM | POA: Diagnosis not present

## 2023-01-28 DIAGNOSIS — E1142 Type 2 diabetes mellitus with diabetic polyneuropathy: Secondary | ICD-10-CM

## 2023-01-28 LAB — POCT GLYCOSYLATED HEMOGLOBIN (HGB A1C): Hemoglobin A1C: 8.9 % — AB (ref 4.0–5.6)

## 2023-01-28 LAB — POCT GLUCOSE (DEVICE FOR HOME USE): POC Glucose: 206 mg/dl — AB (ref 70–99)

## 2023-01-28 MED ORDER — ROSUVASTATIN CALCIUM 20 MG PO TABS: 20.0000 mg | ORAL_TABLET | Freq: Every day | ORAL | 3 refills | Status: DC

## 2023-01-28 MED ORDER — BD PEN NEEDLE MICRO U/F 32G X 6 MM MISC: 1.0000 | Freq: Every day | 3 refills | Status: DC

## 2023-01-28 MED ORDER — TIRZEPATIDE 5 MG/0.5ML ~~LOC~~ SOAJ: 5.0000 mg | SUBCUTANEOUS | 3 refills | Status: DC

## 2023-01-28 MED ORDER — BASAGLAR KWIKPEN 100 UNIT/ML ~~LOC~~ SOPN: 52.0000 [IU] | PEN_INJECTOR | Freq: Every day | SUBCUTANEOUS | 4 refills | Status: DC

## 2023-01-28 NOTE — Patient Instructions (Signed)
-   Increase Basaglar 52 Units daily  - Stop  Rybelsus 14 mg, 1 tablet daily  - Start mounjaro 5 mg once weekly      HOW TO TREAT LOW BLOOD SUGARS (Blood sugar LESS THAN 70 MG/DL) Please follow the RULE OF 15 for the treatment of hypoglycemia treatment (when your (blood sugars are less than 70 mg/dL)   STEP 1: Take 15 grams of carbohydrates when your blood sugar is low, which includes:  3-4 GLUCOSE TABS  OR 3-4 OZ OF JUICE OR REGULAR SODA OR ONE TUBE OF GLUCOSE GEL    STEP 2: RECHECK blood sugar in 15 MINUTES STEP 3: If your blood sugar is still low at the 15 minute recheck --> then, go back to STEP 1 and treat AGAIN with another 15 grams of carbohydrates.

## 2023-01-28 NOTE — Progress Notes (Addendum)
Subjective:   Sandra Wood is a 67 y.o. female who presents for Medicare Annual (Subsequent) preventive examination.  Visit Complete: Virtual  I connected with  Sandra Wood on 02/04/23 by a audio enabled telemedicine application and verified that I am speaking with the correct person using two identifiers.  Patient Location: Home  Provider Location: Office/Clinic  I discussed the limitations of evaluation and management by telemedicine. The patient expressed understanding and agreed to proceed.  Vital Signs: Per patient no change in vitals since last visit.   Review of Systems    Cardiac Risk Factors include: advanced age (>68men, >41 women);hypertension;diabetes mellitus;dyslipidemia;obesity (BMI >30kg/m2)     Objective:    Today's Vitals   01/28/23 1431  Weight: 292 lb (132.5 kg)  Height: 5\' 4"  (1.626 m)   Body mass index is 50.12 kg/m.     01/28/2023    2:37 PM 03/03/2022    9:49 AM 10/15/2021   10:04 AM 03/04/2021    9:11 AM 11/15/2018    3:34 PM 04/12/2017   11:31 AM 02/15/2017   11:01 AM  Advanced Directives  Does Patient Have a Medical Advance Directive? Yes No No No No No No  Type of Estate agent of [place];Living will        Copy of Healthcare Power of Attorney in Chart? No - copy requested        Would patient like information on creating a medical advance directive?  No - Patient declined No - Patient declined No - Patient declined No - Patient declined      Current Medications (verified) Outpatient Encounter Medications as of 01/28/2023  Medication Sig   anastrozole (ARIMIDEX) 1 MG tablet Take 1 tablet (1 mg total) by mouth daily.   blood glucose meter kit and supplies KIT Dispense based on patient and insurance preference. Use up to four times daily as directed. (FOR ICD-9 250.00, 250.01). Check blood sugar BID.   glucose blood (ONETOUCH VERIO) test strip Use as instructed   Insulin Glargine (BASAGLAR KWIKPEN) 100 UNIT/ML  Inject 52 Units into the skin daily.   Insulin Pen Needle (BD PEN NEEDLE MICRO U/F) 32G X 6 MM MISC 1 Device by Does not apply route daily in the afternoon.   losartan (COZAAR) 50 MG tablet TAKE 1 TABLET BY MOUTH EVERY DAY   rosuvastatin (CRESTOR) 20 MG tablet Take 1 tablet (20 mg total) by mouth daily.   tirzepatide [facility]) 5 MG/0.5ML Pen Inject 5 mg into the skin once a week.   [DISCONTINUED] BD PEN NEEDLE NANO 2ND GEN 32G X 4 MM MISC INJECT 1 DEVICE INTO THE SKIN DAILY.   [DISCONTINUED] empagliflozin (JARDIANCE) 25 MG TABS tablet Take 1 tablet (25 mg total) by mouth daily before breakfast.   [DISCONTINUED] Insulin Glargine (BASAGLAR KWIKPEN) 100 UNIT/ML Inject 50 Units into the skin daily.   [DISCONTINUED] Insulin Pen Needle (BD PEN NEEDLE MICRO U/F) 32G X 6 MM MISC 1 Device by Does not apply route daily in the afternoon.   [DISCONTINUED] prochlorperazine (COMPAZINE) 10 MG tablet Take 1 tablet (10 mg total) by mouth every 6 (six) hours as needed (Nausea or vomiting). (Patient not taking: Reported on 01/11/2017)   [DISCONTINUED] rosuvastatin (CRESTOR) 20 MG tablet Take 1 tablet (20 mg total) by mouth daily.   [DISCONTINUED] Semaglutide (RYBELSUS) 14 MG TABS Take 1 tablet (14 mg total) by mouth daily with breakfast.   No facility-administered encounter medications on file as of 01/28/2023.    Allergies (  verified) Metformin and related and No known allergies   History: Past Medical History:  Diagnosis Date   Abnormal Pap smear of cervix 2009   Allergic rhinitis, cause unspecified    Arthritis    Arthritis of ankle BILATERAL / HX FX'S   Asthma, mild persistent    Breast cancer (HCC)    Dyspnea    with much activity   Fibroids 2008   GERD (gastroesophageal reflux disease)    not current   History of radiation therapy 01/27/17-03/16/17   left breast 1.8 Gy in 28 fractions, left breast boost 2 Gy in 6 fractions   HSV-2 (herpes simplex virus 2) infection    at age 40   Malignant neoplasm  of lower-inner quadrant of left breast in female, estrogen receptor positive (HCC) 08/20/2016   Obesity, morbid (HCC)    Personal history of chemotherapy    Personal history of radiation therapy    Pneumonia    2013, 2017   Tear of medial meniscus of knee LEFT   Type II or unspecified type diabetes mellitus without mention of complication, uncontrolled    Type II- has never been on medication   Past Surgical History:  Procedure Laterality Date   BREAST BIOPSY     BREAST LUMPECTOMY Left 09/22/2016   BREAST LUMPECTOMY WITH RADIOACTIVE SEED AND SENTINEL LYMPH NODE BIOPSY Left 09/22/2016   Procedure: BREAST LUMPECTOMY WITH RADIOACTIVE SEED AND LEFT AXILLARY SENTINEL LYMPH NODE BIOPSY;  Surgeon: Ovidio Kin, MD;  Location: [facility] OR;  Service: General;  Laterality: Left;   CHONDROPLASTY  08/19/2011   Procedure: CHONDROPLASTY;  Surgeon: Drucilla Schmidt, MD;  Location: [facility];  Service: Orthopedics;;  shaving of medial femeral chondral   DILATION AND CURETTAGE OF UTERUS     HYSTEROSCOPY WITH D & C  2008   benign endometrial polyp - Dr. Delia Heady   KNEE ARTHROSCOPY W/ MENISCECTOMY Bilateral 07/07/2010   1 year apart   MENISECTOMY  08/19/2011   Procedure: MENISECTOMY;  Surgeon: Drucilla Schmidt, MD;  Location: [facility];  Service: Orthopedics;;  partial lateral menisectomy   PORT-A-CATH REMOVAL N/A 12/21/2017   Procedure: REMOVAL PORT-A-CATH;  Surgeon: Ovidio Kin, MD;  Location: [facility];  Service: General;  Laterality: N/A;   PORTACATH PLACEMENT N/A 09/22/2016   Procedure: INSERTION PORT-A-CATH;  Surgeon: Ovidio Kin, MD;  Location: [facility] OR;  Service: General;  Laterality: N/A;   TONSILLECTOMY     TUBAL LIGATION  AGE 18   Family History  Problem Relation Age of Onset   Diabetes Mother    Heart disease Father    Cancer Brother 26       prostate   Breast cancer Maternal Grandmother 17   Social History   Socioeconomic History   Marital status: Divorced     Spouse name: Not on file   Number of children: 2   Years of education: Not on file   Highest education level: Not on file  Occupational History   Occupation: retired  Tobacco Use   Smoking status: Former    Current packs/day: 0.00    Types: Cigarettes    Start date: 08/13/1981    Quit date: 08/13/1986    Years since quitting: 36.5   Smokeless tobacco: Never  Vaping Use   Vaping status: Never Used  Substance and Sexual Activity   Alcohol use: Not Currently   Drug use: No   Sexual activity: Not Currently    Birth control/protection: Post-menopausal, Abstinence  Other  Topics Concern   Not on file  Social History Narrative   Not on file   Social Determinants of Health   Financial Resource Strain: High Risk (01/28/2023)   Overall Financial Resource Strain (CARDIA)    Difficulty of Paying Living Expenses: Very hard  Food Insecurity: No Food Insecurity (01/28/2023)   Hunger Vital Sign    Worried About Running Out of Food in the Last Year: Never true    Ran Out of Food in the Last Year: Never true  Transportation Needs: No Transportation Needs (01/28/2023)   PRAPARE - Administrator, Civil Service (Medical): No    Lack of Transportation (Non-Medical): No  Physical Activity: Inactive (01/28/2023)   Exercise Vital Sign    Days of Exercise per Week: 0 days    Minutes of Exercise per Session: 0 min  Stress: No Stress Concern Present (01/28/2023)   [employer] of Occupational Health - Occupational Stress Questionnaire    Feeling of Stress : Only a little  Social Connections: Socially Isolated (01/28/2023)   Social Connection and Isolation Panel [NHANES]    Frequency of Communication with Friends and Family: More than three times a week    Frequency of Social Gatherings with Friends and Family: More than three times a week    Attends Religious Services: Never    Database administrator or Organizations: No    Attends Engineer, structural: Never    Marital Status:  Divorced    Tobacco Counseling Counseling given: Not Answered   Clinical Intake:  Pre-visit preparation completed: Yes  Pain : No/denies pain     BMI - recorded: 50.12 Nutritional Status: BMI > 30  Obese Nutritional Risks: None Diabetes: Yes CBG done?: No (per patient-fasting 160) Did pt. bring in CBG monitor from home?: No  How often do you need to have someone help you when you read instructions, pamphlets, or other written materials from your doctor or pharmacy?: 1 - Never  Interpreter Needed?: No  Information entered by ::  , RMA   Activities of Daily Living    01/28/2023    2:34 PM  In your present state of health, do you have any difficulty performing the following activities:  Hearing? 0  Vision? 0  Difficulty concentrating or making decisions? 0  Walking or climbing stairs? 1  Dressing or bathing? 0  Doing errands, shopping? 0  Preparing Food and eating ? N  Using the Toilet? N  In the past six months, have you accidently leaked urine? N  Do you have problems with loss of bowel control? N  Managing your Medications? N  Managing your Finances? N  Housekeeping or managing your Housekeeping? N    Patient Care Team: Etta Grandchild, MD as PCP - General (Internal Medicine) Ovidio Kin, MD as Consulting Physician (General Surgery) Serena Croissant, MD as Consulting Physician (Hematology and Oncology) Lonie Peak, MD as Attending Physician (Radiation Oncology) Patton Salles, MD as Consulting Physician (Obstetrics and Gynecology)  Indicate any recent Medical Services you may have received from other than [facility] providers in the past year (date may be approximate).     Assessment:   This is a routine wellness examination for Sandra Wood.  Hearing/Vision screen Hearing Screening - Comments:: Denies hearing difficulties    Dietary issues and exercise activities discussed:     Goals Addressed   None   Depression Screen    01/28/2023    11:00 AM 08/27/2022    8:16  AM 12/24/2021    8:53 AM 10/15/2021   10:03 AM 11/20/2019   10:47 AM 11/15/2018    3:34 PM 09/13/2018    3:25 PM  PHQ 2/9 Scores  PHQ - 2 Score 0 0 1 0 0 0 1  PHQ- 9 Score 2  3  1  3     Fall Risk    01/28/2023    2:38 PM 08/27/2022    8:16 AM 12/24/2021    8:54 AM 10/15/2021   10:05 AM 04/03/2021    9:51 AM  Fall Risk   Falls in the past year? 0 0 0 0 0  Number falls in past yr: 0 0 0 0   Injury with Fall? 0 0 0 0   Risk for fall due to : No Fall Risks No Fall Risks No Fall Risks No Fall Risks   Follow up Falls prevention discussed Falls evaluation completed Falls evaluation completed Falls evaluation completed     MEDICARE RISK AT HOME:    TIMED UP AND GO:  Was the test performed?  No    Cognitive Function:        01/28/2023    2:39 PM 10/15/2021   10:07 AM  6CIT Screen  What Year?  0 points  What month?  0 points  What time? 0 points 0 points  Count back from 20 2 points 0 points  Months in reverse 0 points 0 points  Repeat phrase 0 points 0 points  Total Score  0 points    Immunizations Immunization History  Administered Date(s) Administered   Fluad Quad(high Dose 65+) 04/24/2020, 04/03/2021, 04/15/2022   Influenza Split 03/31/2012   Influenza,inj,Quad PF,6+ Mos 03/29/2017, 09/13/2018, 04/03/2019   Moderna Sars-Covid-2 Vaccination 10/06/2019, 11/03/2019   Pneumococcal Conjugate-13 11/20/2019   Pneumococcal Polysaccharide-23 12/30/2011, 09/13/2018   Tdap 03/31/2012   Zoster Recombinant(Shingrix) 01/25/2019, 05/22/2019   Zoster, Live 06/02/2012    TDAP status: Up to date  Flu Vaccine status: Up to date  Pneumococcal vaccine status: Up to date  Covid-19 vaccine status: Completed vaccines  Qualifies for Shingles Vaccine? Yes   Zostavax completed Yes   Shingrix Completed?: Yes  Screening Tests Health Maintenance  Topic Date Due   Colonoscopy  Never done   COVID-19 Vaccine (3 - Moderna risk series) 12/01/2019    OPHTHALMOLOGY EXAM  12/09/2019   DTaP/Tdap/Td (2 - Td or Tdap) 03/31/2022   INFLUENZA VACCINE  01/28/2023   Diabetic kidney evaluation - Urine ACR  04/16/2023   FOOT EXAM  07/22/2023   HEMOGLOBIN A1C  07/31/2023   Diabetic kidney evaluation - eGFR measurement  08/27/2023   Medicare Annual Wellness (AWV)  01/28/2024   MAMMOGRAM  02/24/2024   Pneumonia Vaccine 28+ Years old (3 of 3 - PPSV23 or PCV20) 11/19/2024   DEXA SCAN  Completed   Hepatitis C Screening  Completed   Zoster Vaccines- Shingrix  Completed   HPV VACCINES  Aged Out    Health Maintenance  Health Maintenance Due  Topic Date Due   Colonoscopy  Never done   COVID-19 Vaccine (3 - Moderna risk series) 12/01/2019   OPHTHALMOLOGY EXAM  12/09/2019   DTaP/Tdap/Td (2 - Td or Tdap) 03/31/2022   INFLUENZA VACCINE  01/28/2023    Colorectal cancer screening: Referral to GI placed 01/28/2023. Pt aware the office will call re: appt.  Mammogram status: Completed 08/28/2022. Repeat every year   Bone Density status: Completed 08/05/2021. Results reflect: Bone density results: OSTEOPENIA. Repeat every 2 years.  Lung Cancer  Screening: (Low Dose CT Chest recommended if Age 37-80 years, 20 pack-year currently smoking OR have quit w/in 15years.) does not qualify.   Lung Cancer Screening Referral: N/A  Additional Screening:  Hepatitis C Screening: does qualify; Completed 09/13/2018  Vision Screening: Recommended annual ophthalmology exams for early detection of glaucoma and other disorders of the eye. Is the patient up to date with their annual eye exam?  Yes  Who is the provider or what is the name of the office in which the patient attends annual eye exams? [facility] If pt is not established with a provider, would they like to be referred to a provider to establish care? No .   Dental Screening: Recommended annual dental exams for proper oral hygiene  Community Resource Referral / Chronic Care Management: CRR  required this visit?  No   CCM required this visit?  No     Plan:     I have personally reviewed and noted the following in the patient's chart:   Medical and social history Use of alcohol, tobacco or illicit drugs  Current medications and supplements including opioid prescriptions. Patient is not currently taking opioid prescriptions. Functional ability and status Nutritional status Physical activity Advanced directives List of other physicians Hospitalizations, surgeries, and ER visits in previous 12 months Vitals Screenings to include cognitive, depression, and falls Referrals and appointments  In addition, I have reviewed and discussed with patient certain preventive protocols, quality metrics, and best practice recommendations. A written personalized care plan for preventive services as well as general preventive health recommendations were provided to patient.      L , CMA   01/28/2023   After Visit Summary: (MyChart) Due to this being a telephonic visit, the after visit summary with patients personalized plan was offered to patient via MyChart   Nurse Notes: Patient has no concerns today.  A referral has been place for a colonoscopy.

## 2023-01-28 NOTE — Progress Notes (Signed)
Name: Sandra Wood  Age/ Sex: 67 y.o., female   MRN/ DOB: [id number], 03-19-1956     PCP: Etta Grandchild, MD   Reason for Endocrinology Evaluation: Type 2 Diabetes Mellitus  Initial Endocrine Consultative Visit: 11/07/2018    PATIENT IDENTIFIER: Sandra Wood is a 67 y.o. female with a past medical history of HTN, T2DM, Asthma, Hx of Breast Ca (2018). The patient has followed with Endocrinology clinic since 11/07/2018 for consultative assistance with management of her diabetes.  DIABETIC HISTORY:  Sandra Wood was diagnosed with T2DM in 2013. Pt was on diet control until 08/2018 when she was started on Metformin, Rybelsus and Insulin. Her hemoglobin A1c has ranged from 6.6% in 2013, peaking at 12.5% in 2020  On her initial visit to our clinic her A1c was 8.8 % . She was on Rybelsus, tresiba and Metformin  Stopped Metformin 10/2020 due to GI side effects  Attempted to start Jardiance through patient assistance program in January 2023  that she did not fill out   SUBJECTIVE:   During the last visit (07/21/2022): A1c 8.0 %      Today (01/28/2023): Sandra Wood is here for a follow up on her diabetes management.  She checks  glucose rarely.    She follows with oncology for hx of breast ca   Denies nausea or  vomiting  Denies constipation or diarrhea  She did not started Jardiance and is skeptical about side effects    HOME DIABETES REGIMEN:  Basaglar  50 units daily  Rybelsus 14 mg daily  Jardiance 25 mg daily - not taking       DIABETIC COMPLICATIONS: Microvascular complications:  neuropathy Denies: CKD,  Last eye exam: Completed 2022  Macrovascular complications:   Denies: CAD, PVD, CVA      HISTORY:  Past Medical History:  Past Medical History:  Diagnosis Date   Abnormal Pap smear of cervix 2009   Allergic rhinitis, cause unspecified    Arthritis    Arthritis of ankle BILATERAL / HX FX'S   Asthma, mild persistent    Breast cancer (HCC)     Dyspnea    with much activity   Fibroids 2008   GERD (gastroesophageal reflux disease)    not current   History of radiation therapy 01/27/17-03/16/17   left breast 1.8 Gy in 28 fractions, left breast boost 2 Gy in 6 fractions   HSV-2 (herpes simplex virus 2) infection    at age 66   Malignant neoplasm of lower-inner quadrant of left breast in female, estrogen receptor positive (HCC) 08/20/2016   Obesity, morbid (HCC)    Personal history of chemotherapy    Personal history of radiation therapy    Pneumonia    2013, 2017   Tear of medial meniscus of knee LEFT   Type II or unspecified type diabetes mellitus without mention of complication, uncontrolled    Type II- has never been on medication   Past Surgical History:  Past Surgical History:  Procedure Laterality Date   BREAST BIOPSY     BREAST LUMPECTOMY Left 09/22/2016   BREAST LUMPECTOMY WITH RADIOACTIVE SEED AND SENTINEL LYMPH NODE BIOPSY Left 09/22/2016   Procedure: BREAST LUMPECTOMY WITH RADIOACTIVE SEED AND LEFT AXILLARY SENTINEL LYMPH NODE BIOPSY;  Surgeon: Ovidio Kin, MD;  Location: [facility] OR;  Service: General;  Laterality: Left;   CHONDROPLASTY  08/19/2011   Procedure: CHONDROPLASTY;  Surgeon: Drucilla Schmidt, MD;  Location: [facility];  Service: Orthopedics;;  shaving  of medial femeral chondral   DILATION AND CURETTAGE OF UTERUS     HYSTEROSCOPY WITH D & C  2008   benign endometrial polyp - Dr. Delia Heady   KNEE ARTHROSCOPY W/ MENISCECTOMY Bilateral 07/07/2010   1 year apart   MENISECTOMY  08/19/2011   Procedure: MENISECTOMY;  Surgeon: Drucilla Schmidt, MD;  Location: [facility];  Service: Orthopedics;;  partial lateral menisectomy   PORT-A-CATH REMOVAL N/A 12/21/2017   Procedure: REMOVAL PORT-A-CATH;  Surgeon: Ovidio Kin, MD;  Location: [facility] OR;  Service: General;  Laterality: N/A;   PORTACATH PLACEMENT N/A 09/22/2016   Procedure: INSERTION PORT-A-CATH;  Surgeon: Ovidio Kin, MD;  Location: [facility]  OR;  Service: General;  Laterality: N/A;   TONSILLECTOMY     TUBAL LIGATION  AGE 42   Social History:  reports that she quit smoking about 36 years ago. Her smoking use included cigarettes. She started smoking about 41 years ago. She has never used smokeless tobacco. She reports that she does not currently use alcohol. She reports that she does not use drugs. Family History:  Family History  Problem Relation Age of Onset   Diabetes Mother    Heart disease Father    Cancer Brother 58       prostate   Breast cancer Maternal Grandmother 5     HOME MEDICATIONS: Allergies as of 01/28/2023       Reactions   Metformin And Related    GI upset    No Known Allergies         Medication List        Accurate as of January 28, 2023 11:43 AM. If you have any questions, ask your nurse or doctor.          anastrozole 1 MG tablet Commonly known as: ARIMIDEX Take 1 tablet (1 mg total) by mouth daily.   Basaglar KwikPen 100 UNIT/ML Inject 50 Units into the skin daily.   BD Pen Needle Nano 2nd Gen 32G X 4 MM Misc Generic drug: Insulin Pen Needle INJECT 1 DEVICE INTO THE SKIN DAILY.   BD Pen Needle Micro U/F 32G X 6 MM Misc Generic drug: Insulin Pen Needle 1 Device by Does not apply route daily in the afternoon.   blood glucose meter kit and supplies Kit Dispense based on patient and insurance preference. Use up to four times daily as directed. (FOR ICD-9 250.00, 250.01). Check blood sugar BID.   empagliflozin 25 MG Tabs tablet Commonly known as: Jardiance Take 1 tablet (25 mg total) by mouth daily before breakfast.   losartan 50 MG tablet Commonly known as: COZAAR TAKE 1 TABLET BY MOUTH EVERY DAY   OneTouch Verio test strip Generic drug: glucose blood Use as instructed   rosuvastatin 20 MG tablet Commonly known as: CRESTOR Take 1 tablet (20 mg total) by mouth daily.   Rybelsus 14 MG Tabs Generic drug: Semaglutide Take 1 tablet (14 mg total) by mouth daily with  breakfast.         OBJECTIVE:   Vital Signs: BP 118/74 (BP Location: Left Arm, Patient Position: Sitting, Cuff Size: Large)   Pulse 92   Ht 5\' 3"  (1.6 m)   Wt (!) 301 lb (136.5 kg)   LMP 06/30/2003   SpO2 99%   BMI 53.32 kg/m   Wt Readings from Last 3 Encounters:  01/28/23 (!) 301 lb (136.5 kg)  08/27/22 291 lb (132 kg)  07/21/22 291 lb 6.4 oz (132.2 kg)  Exam: General: Pt appears well and is in NAD  Lungs: Clear with good BS bilat   Heart: RRR   Extremities: No pretibial edema.   Neuro: MS is good with appropriate affect, pt is alert and Ox3       DM foot exam: 07/21/2022    The skin of the feet is intact without sores or ulcerations.Plantar callous formation noted bilaterally The pedal pulses are 2+ on right and 2+ on left. The sensation is intact to a screening 5.07, 10 gram monofilament bilaterally at the heels       DATA REVIEWED:  Lab Results  Component Value Date   HGBA1C 8.9 (A) 01/28/2023   HGBA1C 8.2 (H) 08/27/2022   HGBA1C 8.4 Repeated and verified X2. (H) 04/15/2022    Latest Reference Range & Units 08/27/22 08:47  Sodium 135 - 145 mEq/L 139  Potassium 3.5 - 5.1 mEq/L 4.4  Chloride 96 - 112 mEq/L 102  CO2 19 - 32 mEq/L 27  Glucose 70 - 99 mg/dL 191 (H)  BUN 6 - 23 mg/dL 16  Creatinine 4.78 - 2.95 mg/dL 6.21  Calcium 8.4 - 30.8 mg/dL 9.7  GFR >65.78 mL/min 87.68  Pro B Natriuretic peptide (BNP) 0.0 - 100.0 pg/mL 3.0     Latest Reference Range & Units 04/15/22 08:51  Total CHOL/HDL Ratio  3  Cholesterol 0 - 200 mg/dL 469  HDL Cholesterol >62.95 mg/dL 28.41  LDL (calc) 0 - 99 mg/dL 56  MICROALB/CREAT RATIO 0.0 - 30.0 mg/g 0.9  NonHDL  83.40  Triglycerides 0.0 - 149.0 mg/dL 324.4  VLDL 0.0 - 01.0 mg/dL 27.2    In office BG 536 mg/dL    ASSESSMENT / PLAN / RECOMMENDATIONS:   1) Type 2 Diabetes Mellitus, Poorly controlled, With Neuropathic complications - Most recent A1c of 8.9 %. Goal A1c < 7.0 %.    -Her A1c has been trending  up - Intolerant to  metformin due to GI issues - CGM cost prohibitive  -I have encouraged the patient to check glucose more frequently at home especially with starting new medication - Jardiance cost-prohibitive , she was provided with pt assistance forms in 06/2021 but she did not fill out, I offered to prescribe it again today but she is skeptical about side effects and would like to hold off for now, I will remove this from her medication list -I have recommended switching Rybelsus to [facility], caution against GI side effects -Patient advised to perform stretching exercises at home -Will increase insulin as below  MEDICATIONS: - Increase  Basaglar 52 Units daily  - Stop Rybelsus 14 mg daily  -Start Mounjaro 5 mg weekly    EDUCATION / INSTRUCTIONS: BG monitoring instructions: Patient is instructed to check her blood sugars 2 times a day, fasting and bedtime. Call [facility] if: BG persistently < 70 I reviewed the Rule of 15 for the treatment of hypoglycemia in detail with the patient. Literature supplied.   2) Dyslipidemia :  -LDL has been at goal in the past  Medication Continue rosuvastatin 20 mg daily   F/U in 3 months    Signed electronically by: Lyndle Herrlich, MD  [facility]  Endocrinology  [facility] [address] [place] [place], [place] [address] Phone: [phone] FAX: [phone]   CC: Etta Grandchild, MD [address] [place] [address] Phone: [phone]  Fax: [phone]  Return to Endocrinology clinic as below: Future Appointments  Date Time Provider Department Center  01/28/2023  2:30 PM [facility] NURSE 2 [facility] None  02/18/2023 10:00 AM [facility] NURSE [facility] None  03/03/2023 10:00 AM [facility] DIAG [facility]OM[facility] 1 [facility]  03/04/2023  9:30 AM Serena Croissant, MD [facility] None

## 2023-01-28 NOTE — Patient Instructions (Signed)
Sandra Wood , Thank you for taking time to come for your Medicare Wellness Visit. I appreciate your ongoing commitment to your health goals. Please review the following plan we discussed and let me know if I can assist you in the future.   Referrals/Orders/Follow-Ups/Clinician Recommendations: Remember to call and schedule your colon screening at [facility], [phone].  Aim for 30 minutes of exercise or brisk walking, 6-8 glasses of water, and 5 servings of fruits and vegetables each day.   This is a list of the screening recommended for you and due dates:  Health Maintenance  Topic Date Due   Colon Cancer Screening  Never done   COVID-19 Vaccine (3 - Moderna risk series) 12/01/2019   Eye exam for diabetics  12/09/2019   DTaP/Tdap/Td vaccine (2 - Td or Tdap) 03/31/2022   Flu Shot  01/28/2023   Yearly kidney health urinalysis for diabetes  04/16/2023   Complete foot exam   07/22/2023   Hemoglobin A1C  07/31/2023   Yearly kidney function blood test for diabetes  08/27/2023   Medicare Annual Wellness Visit  01/28/2024   Mammogram  02/24/2024   Pneumonia Vaccine (3 of 3 - PPSV23 or PCV20) 11/19/2024   DEXA scan (bone density measurement)  Completed   Hepatitis C Screening  Completed   Zoster (Shingles) Vaccine  Completed   HPV Vaccine  Aged Out    Advanced directives: (Copy Requested) Please bring a copy of your health care power of attorney and living will to the office to be added to your chart at your convenience.  Next Medicare Annual Wellness Visit scheduled for next year: Yes  Preventive Care 68 Years and Older, Female Preventive care refers to lifestyle choices and visits with your health care provider that can promote health and wellness. What does preventive care include? A yearly physical exam. This is also called an annual well check. Dental exams once or twice a year. Routine eye exams. Ask your health care provider how often you should have  your eyes checked. Personal lifestyle choices, including: Daily care of your teeth and gums. Regular physical activity. Eating a healthy diet. Avoiding tobacco and drug use. Limiting alcohol use. Practicing safe sex. Taking low-dose aspirin every day. Taking vitamin and mineral supplements as recommended by your health care provider. What happens during an annual well check? The services and screenings done by your health care provider during your annual well check will depend on your age, overall health, lifestyle risk factors, and family history of disease. Counseling  Your health care provider may ask you questions about your: Alcohol use. Tobacco use. Drug use. Emotional well-being. Home and relationship well-being. Sexual activity. Eating habits. History of falls. Memory and ability to understand (cognition). Work and work Astronomer. Reproductive health. Screening  You may have the following tests or measurements: Height, weight, and BMI. Blood pressure. Lipid and cholesterol levels. These may be checked every 5 years, or more frequently if you are over 39 years old. Skin check. Lung cancer screening. You may have this screening every year starting at age 68 if you have a 30-pack-year history of smoking and currently smoke or have quit within the past 15 years. Fecal occult blood test (FOBT) of the stool. You may have this test every year starting at age 68. Flexible sigmoidoscopy or colonoscopy. You may have a sigmoidoscopy every 5 years or a colonoscopy every 10 years starting at age 52. Hepatitis C blood test. Hepatitis B blood test. Sexually transmitted disease (  STD) testing. Diabetes screening. This is done by checking your blood sugar (glucose) after you have not eaten for a while (fasting). You may have this done every 1-3 years. Bone density scan. This is done to screen for osteoporosis. You may have this done starting at age 68. Mammogram. This may be done every  1-2 years. Talk to your health care provider about how often you should have regular mammograms. Talk with your health care provider about your test results, treatment options, and if necessary, the need for more tests. Vaccines  Your health care provider may recommend certain vaccines, such as: Influenza vaccine. This is recommended every year. Tetanus, diphtheria, and acellular pertussis (Tdap, Td) vaccine. You may need a Td booster every 10 years. Zoster vaccine. You may need this after age 97. Pneumococcal 13-valent conjugate (PCV13) vaccine. One dose is recommended after age 68. Pneumococcal polysaccharide (PPSV23) vaccine. One dose is recommended after age 83. Talk to your health care provider about which screenings and vaccines you need and how often you need them. This information is not intended to replace advice given to you by your health care provider. Make sure you discuss any questions you have with your health care provider. Document Released: 07/12/2015 Document Revised: 03/04/2016 Document Reviewed: 04/16/2015 Elsevier Interactive Patient Education  2017 [employer].  Fall Prevention in the Home Falls can cause injuries. They can happen to people of all ages. There are many things you can do to make your home safe and to help prevent falls. What can I do on the outside of my home? Regularly fix the edges of walkways and driveways and fix any cracks. Remove anything that might make you trip as you walk through a door, such as a raised step or threshold. Trim any bushes or trees on the path to your home. Use bright outdoor lighting. Clear any walking paths of anything that might make someone trip, such as rocks or tools. Regularly check to see if handrails are loose or broken. Make sure that both sides of any steps have handrails. Any raised decks and porches should have guardrails on the edges. Have any leaves, snow, or ice cleared regularly. Use sand or salt on walking  paths during winter. Clean up any spills in your garage right away. This includes oil or grease spills. What can I do in the bathroom? Use night lights. Install grab bars by the toilet and in the tub and shower. Do not use towel bars as grab bars. Use non-skid mats or decals in the tub or shower. If you need to sit down in the shower, use a plastic, non-slip stool. Keep the floor dry. Clean up any water that spills on the floor as soon as it happens. Remove soap buildup in the tub or shower regularly. Attach bath mats securely with double-sided non-slip rug tape. Do not have throw rugs and other things on the floor that can make you trip. What can I do in the bedroom? Use night lights. Make sure that you have a light by your bed that is easy to reach. Do not use any sheets or blankets that are too big for your bed. They should not hang down onto the floor. Have a firm chair that has side arms. You can use this for support while you get dressed. Do not have throw rugs and other things on the floor that can make you trip. What can I do in the kitchen? Clean up any spills right away. Avoid walking on  wet floors. Keep items that you use a lot in easy-to-reach places. If you need to reach something above you, use a strong step stool that has a grab bar. Keep electrical cords out of the way. Do not use floor polish or wax that makes floors slippery. If you must use wax, use non-skid floor wax. Do not have throw rugs and other things on the floor that can make you trip. What can I do with my stairs? Do not leave any items on the stairs. Make sure that there are handrails on both sides of the stairs and use them. Fix handrails that are broken or loose. Make sure that handrails are as long as the stairways. Check any carpeting to make sure that it is firmly attached to the stairs. Fix any carpet that is loose or worn. Avoid having throw rugs at the top or bottom of the stairs. If you do have  throw rugs, attach them to the floor with carpet tape. Make sure that you have a light switch at the top of the stairs and the bottom of the stairs. If you do not have them, ask someone to add them for you. What else can I do to help prevent falls? Wear shoes that: Do not have high heels. Have rubber bottoms. Are comfortable and fit you well. Are closed at the toe. Do not wear sandals. If you use a stepladder: Make sure that it is fully opened. Do not climb a closed stepladder. Make sure that both sides of the stepladder are locked into place. Ask someone to hold it for you, if possible. Clearly mark and make sure that you can see: Any grab bars or handrails. First and last steps. Where the edge of each step is. Use tools that help you move around (mobility aids) if they are needed. These include: Canes. Walkers. Scooters. Crutches. Turn on the lights when you go into a dark area. Replace any light bulbs as soon as they burn out. Set up your furniture so you have a clear path. Avoid moving your furniture around. If any of your floors are uneven, fix them. If there are any pets around you, be aware of where they are. Review your medicines with your doctor. Some medicines can make you feel dizzy. This can increase your chance of falling. Ask your doctor what other things that you can do to help prevent falls. This information is not intended to replace advice given to you by your health care provider. Make sure you discuss any questions you have with your health care provider. Document Released: 04/11/2009 Document Revised: 11/21/2015 Document Reviewed: 07/20/2014 Elsevier Interactive Patient Education  2017 [employer].

## 2023-02-18 ENCOUNTER — Ambulatory Visit (INDEPENDENT_AMBULATORY_CARE_PROVIDER_SITE_OTHER): Payer: Medicare Other | Admitting: *Deleted

## 2023-02-18 DIAGNOSIS — E538 Deficiency of other specified B group vitamins: Secondary | ICD-10-CM | POA: Diagnosis not present

## 2023-02-18 MED ORDER — CYANOCOBALAMIN 1000 MCG/ML IJ SOLN
1000.0000 ug | Freq: Once | INTRAMUSCULAR | Status: AC
Start: 2023-02-18 — End: 2023-02-18
  Administered 2023-02-18: 1000 ug via INTRAMUSCULAR

## 2023-02-18 NOTE — Progress Notes (Signed)
Pls cosign for B12 inj../lmb  

## 2023-03-03 ENCOUNTER — Ambulatory Visit: Admission: RE | Admit: 2023-03-03 | Payer: Medicare Other | Source: Ambulatory Visit

## 2023-03-03 DIAGNOSIS — R921 Mammographic calcification found on diagnostic imaging of breast: Secondary | ICD-10-CM

## 2023-03-04 ENCOUNTER — Inpatient Hospital Stay: Payer: Medicare Other | Attending: Hematology and Oncology | Admitting: Hematology and Oncology

## 2023-03-04 ENCOUNTER — Other Ambulatory Visit: Payer: Self-pay | Admitting: Hematology and Oncology

## 2023-03-04 VITALS — BP 153/58 | HR 91 | Temp 97.2°F | Resp 18 | Ht 64.0 in | Wt 298.8 lb

## 2023-03-04 DIAGNOSIS — C50312 Malignant neoplasm of lower-inner quadrant of left female breast: Secondary | ICD-10-CM | POA: Diagnosis present

## 2023-03-04 DIAGNOSIS — R921 Mammographic calcification found on diagnostic imaging of breast: Secondary | ICD-10-CM

## 2023-03-04 DIAGNOSIS — Z780 Asymptomatic menopausal state: Secondary | ICD-10-CM | POA: Diagnosis not present

## 2023-03-04 DIAGNOSIS — Z170 Estrogen receptor positive status [ER+]: Secondary | ICD-10-CM | POA: Diagnosis not present

## 2023-03-04 DIAGNOSIS — Z79811 Long term (current) use of aromatase inhibitors: Secondary | ICD-10-CM | POA: Insufficient documentation

## 2023-03-04 MED ORDER — ANASTROZOLE 1 MG PO TABS: 1.0000 mg | ORAL_TABLET | Freq: Every day | ORAL | 3 refills | Status: DC

## 2023-03-04 NOTE — Assessment & Plan Note (Addendum)
Left breast biopsy 08/18/2016: 8:00 retroareolar position: IDC, high-grade with DCIS high-grade, ER 90%, PR 95%, Ki-67 20%, HER-2 positive ratio 3.4; screening detected left breast distortion LIQ 8 8:00 retroareolar 0.8 cm, axilla negative, T1b N0 stage IA clinical stage Left lumpectomy 09/22/2016: IDC grade 3, 1.3 cm, DCIS high-grade, margins clear, 0/1 lymph node negative, T1 CN 0 stage IA, ER 90%, PR 95%, Ki-67 20%, HER-2 positive ratio 3.4    Treatment summary: 1. Adjuvant chemotherapy and Herceptin with weekly Taxol and Herceptin 12  completed 01/04/2017 followed by Herceptin maintenance for 1 year completed 10/04/2017 3. Adjuvant radiation 01/27/2017-03/16/2017 4. Adjuvant antiestrogen therapy with letrozole 2.5 mg daily  7 years started 05/10/2017 ----------------------------------------------------------------------------- Diabetes: No change in the current plan   Anastrozole toxicities: Mild hot flashes. She has chronic osteoarthritis related to weight.   Breast cancer surveillance: 1. Mammogram and ultrasound 03/03/2023: Increasing calcifications and biopsy recommended, breast density category B   Bone density: T score -2: Osteopenia: Encouraged her to take vitamin D and double up on the calcium.  Will get another bone density test in February 2025   Weight issues: Started Wegovy 3 weeks ago.  Previously she was on Rybelsus.   Return to clinic in 1 year for follow-up.  I sent a prescription refill for anastrozole.

## 2023-03-04 NOTE — Progress Notes (Signed)
Patient Care Team: Etta Grandchild, MD as PCP - General (Internal Medicine) Ovidio Kin, MD as Consulting Physician (General Surgery) Serena Croissant, MD as Consulting Physician (Hematology and Oncology) Lonie Peak, MD as Attending Physician (Radiation Oncology) Patton Salles, MD as Consulting Physician (Obstetrics and Gynecology)  DIAGNOSIS:  Encounter Diagnoses  Name Primary?   Malignant neoplasm of lower-inner quadrant of left breast in female, estrogen receptor positive (HCC) Yes   Postmenopausal     SUMMARY OF ONCOLOGIC HISTORY: Oncology History  Malignant neoplasm of lower-inner quadrant of left breast in female, estrogen receptor positive (HCC)  08/18/2016 Initial Diagnosis   Left breast biopsy 8:00 retroareolar position: IDC, high-grade with DCIS high-grade, ER 90%, PR 95%, Ki-67 20%, HER-2 positive ratio 3.4; screening detected left breast distortion LIQ 8 8:00 retroareolar 0.8 cm, axilla negative, T1b N0 stage IA clinical stage   09/22/2016 Surgery   Left lumpectomy: IDC grade 3, 1.3 cm, DCIS high-grade, margins clear, 0/1 lymph node negative, T1CN 0 stage IA, ER 90%, PR 95%, Ki-67 20%, HER-2 positive ratio 3.4   10/19/2016 - 01/04/2017 Chemotherapy   Taxol Herceptin weekly 12 followed by Herceptin maintenance every 3 weeks for 1 year    01/27/2017 - 03/16/2017 Radiation Therapy   Adjuvant radiation therapy   05/10/2017 -  Anti-estrogen oral therapy   Anastrozole 1 mg p.o. daily     CHIEF COMPLIANT: Follow-up of left breast cancer on anastrozole therapy   INTERVAL HISTORY: Sandra Wood is a 67 y.o. with above-mentioned history of left breast cancer. Currently on anastrozole. Patient reports that she is tolerating the anastrozole extremely well with no complaints or side effects.    ALLERGIES:  is allergic to metformin and related and no known allergies.  MEDICATIONS:  Current Outpatient Medications  Medication Sig Dispense Refill   blood  glucose meter kit and supplies KIT Dispense based on patient and insurance preference. Use up to four times daily as directed. (FOR ICD-9 250.00, 250.01). Check blood sugar BID. 1 each 0   glucose blood (ONETOUCH VERIO) test strip Use as instructed 100 each 12   Insulin Glargine (BASAGLAR KWIKPEN) 100 UNIT/ML Inject 52 Units into the skin daily. 45 mL 4   Insulin Pen Needle (BD PEN NEEDLE MICRO U/F) 32G X 6 MM MISC 1 Device by Does not apply route daily in the afternoon. 100 each 3   losartan (COZAAR) 50 MG tablet TAKE 1 TABLET BY MOUTH EVERY DAY 90 tablet 1   rosuvastatin (CRESTOR) 20 MG tablet Take 1 tablet (20 mg total) by mouth daily. 90 tablet 3   tirzepatide (MOUNJARO) 5 MG/0.5ML Pen Inject 5 mg into the skin once a week. 6 mL 3   anastrozole (ARIMIDEX) 1 MG tablet Take 1 tablet (1 mg total) by mouth daily. 90 tablet 3   No current facility-administered medications for this visit.    PHYSICAL EXAMINATION: ECOG PERFORMANCE STATUS: 1 - Symptomatic but completely ambulatory  Vitals:   03/04/23 0925  BP: (!) 153/58  Pulse: 91  Resp: 18  Temp: (!) 97.2 F (36.2 C)  SpO2: 95%   Filed Weights   03/04/23 0925  Weight: 298 lb 12.8 oz (135.5 kg)      LABORATORY DATA:  I have reviewed the data as listed    Latest Ref Rng & Units 08/27/2022    8:47 AM 04/15/2022    8:51 AM 12/24/2021    9:38 AM  CMP  Glucose 70 - 99 mg/dL 865  784  195   BUN 6 - 23 mg/dL 16  14  16    Creatinine 0.40 - 1.20 mg/dL 4.69  6.29  5.28   Sodium 135 - 145 mEq/L 139  137  138   Potassium 3.5 - 5.1 mEq/L 4.4  3.9  4.1   Chloride 96 - 112 mEq/L 102  103  102   CO2 19 - 32 mEq/L 27  26  26    Calcium 8.4 - 10.5 mg/dL 9.7  9.4  9.8   Total Protein 6.0 - 8.3 g/dL  7.8    Total Bilirubin 0.2 - 1.2 mg/dL  0.6    Alkaline Phos 39 - 117 U/L  133    AST 0 - 37 U/L  13    ALT 0 - 35 U/L  11      Lab Results  Component Value Date   WBC 10.4 08/27/2022   HGB 14.4 08/27/2022   HCT 43.4 08/27/2022   MCV 81.6  08/27/2022   PLT 360.0 08/27/2022   NEUTROABS 7.1 08/27/2022    ASSESSMENT & PLAN:  Malignant neoplasm of lower-inner quadrant of left breast in female, estrogen receptor positive (HCC) Left breast biopsy 08/18/2016: 8:00 retroareolar position: IDC, high-grade with DCIS high-grade, ER 90%, PR 95%, Ki-67 20%, HER-2 positive ratio 3.4; screening detected left breast distortion LIQ 8 8:00 retroareolar 0.8 cm, axilla negative, T1b N0 stage IA clinical stage Left lumpectomy 09/22/2016: IDC grade 3, 1.3 cm, DCIS high-grade, margins clear, 0/1 lymph node negative, T1 CN 0 stage IA, ER 90%, PR 95%, Ki-67 20%, HER-2 positive ratio 3.4    Treatment summary: 1. Adjuvant chemotherapy and Herceptin with weekly Taxol and Herceptin 12  completed 01/04/2017 followed by Herceptin maintenance for 1 year completed 10/04/2017 3. Adjuvant radiation 01/27/2017-03/16/2017 4. Adjuvant antiestrogen therapy with letrozole 2.5 mg daily  7 years started 05/10/2017 ----------------------------------------------------------------------------- Diabetes: No change in the current plan   Anastrozole toxicities: Mild hot flashes. She has chronic osteoarthritis related to weight.   Breast cancer surveillance: 1. Mammogram and ultrasound 03/03/2023: Increasing calcifications and biopsy recommended, breast density category B   Bone density: T score -2: Osteopenia: Encouraged her to take vitamin D and double up on the calcium.  Will get another bone density test in February 2025   Weight issues: Started Wegovy 3 weeks ago.  Previously she was on Rybelsus.   Return to clinic in 1 year for follow-up.  I sent a prescription refill for anastrozole.    Orders Placed This Encounter  Procedures   DG Bone Density    Standing Status:   Future    Standing Expiration Date:   03/03/2024    Order Specific Question:   Reason for Exam (SYMPTOM  OR DIAGNOSIS REQUIRED)    Answer:   postmenopausal    Order Specific Question:   Preferred  imaging location?    Answer:   [facility]   The patient has a good understanding of the overall plan. she agrees with it. she will call with any problems that may develop before the next visit here. Total time spent: 30 mins including face to face time and time spent for planning, charting and co-ordination of care   Tamsen Meek, MD 03/04/23    I Janan Ridge am acting as a Neurosurgeon for [employer]  I have reviewed the above documentation for accuracy and completeness, and I agree with the above.

## 2023-03-10 ENCOUNTER — Ambulatory Visit
Admission: RE | Admit: 2023-03-10 | Discharge: 2023-03-10 | Disposition: A | Discharge status: Home / Self Care | Payer: Medicare Other | Source: Ambulatory Visit | Attending: Hematology and Oncology

## 2023-03-10 ENCOUNTER — Ambulatory Visit
Admission: RE | Admit: 2023-03-10 | Discharge: 2023-03-10 | Disposition: A | Discharge status: Home / Self Care | Payer: Medicare Other | Source: Ambulatory Visit | Attending: Hematology and Oncology | Admitting: Hematology and Oncology

## 2023-03-10 DIAGNOSIS — R921 Mammographic calcification found on diagnostic imaging of breast: Secondary | ICD-10-CM

## 2023-03-10 HISTORY — PX: BREAST BIOPSY: SHX20

## 2023-03-11 LAB — SURGICAL PATHOLOGY

## 2023-03-16 ENCOUNTER — Encounter: Payer: Self-pay | Admitting: Hematology and Oncology

## 2023-03-16 ENCOUNTER — Telehealth: Payer: Self-pay | Admitting: *Deleted

## 2023-03-16 NOTE — Telephone Encounter (Signed)
Spoke with pt and informed her that due to e BMI she will have to have procedure done at hospital. Pt stated she understood. OV made with Dr Barron Alvine for 06/17/23 @ 9 am and was told to come 15 min early. Pt states she will.

## 2023-03-22 ENCOUNTER — Ambulatory Visit (INDEPENDENT_AMBULATORY_CARE_PROVIDER_SITE_OTHER): Payer: Medicare Other

## 2023-03-22 DIAGNOSIS — Z23 Encounter for immunization: Secondary | ICD-10-CM | POA: Diagnosis not present

## 2023-03-22 DIAGNOSIS — E538 Deficiency of other specified B group vitamins: Secondary | ICD-10-CM

## 2023-03-22 MED ORDER — CYANOCOBALAMIN 1000 MCG/ML IJ SOLN
1000.0000 ug | Freq: Once | INTRAMUSCULAR | Status: AC
Start: 2023-03-22 — End: 2023-03-22
  Administered 2023-03-22: 1000 ug via INTRAMUSCULAR

## 2023-03-22 NOTE — Progress Notes (Signed)
Pt here for monthly B12 injection per Dr. Yetta Barre   B12 given IM and pt tolerated injection well.  Patient was advised to report to the office if she noticed any adverse reaction.   Patient also given her HD flu vaccine into her Right Deltoid. She tolerated the medication well and her injection site look fine. She was also advised to report to the office immediately if she noticed any adverse reactions.

## 2023-04-01 ENCOUNTER — Encounter: Payer: Medicare Other | Admitting: Gastroenterology

## 2023-04-05 ENCOUNTER — Encounter: Payer: Medicare Other | Admitting: Gastroenterology

## 2023-04-21 ENCOUNTER — Ambulatory Visit (INDEPENDENT_AMBULATORY_CARE_PROVIDER_SITE_OTHER): Payer: Medicare Other | Admitting: Radiology

## 2023-04-21 DIAGNOSIS — E538 Deficiency of other specified B group vitamins: Secondary | ICD-10-CM | POA: Diagnosis not present

## 2023-04-21 MED ORDER — CYANOCOBALAMIN 1000 MCG/ML IJ SOLN
1000.0000 ug | Freq: Once | INTRAMUSCULAR | Status: AC
Start: 2023-04-21 — End: 2023-04-21
  Administered 2023-04-21: 1000 ug via INTRAMUSCULAR

## 2023-04-21 NOTE — Progress Notes (Signed)
Patient here for B12 injection. Patient tolerated well with no complications

## 2023-05-03 ENCOUNTER — Encounter: Payer: Self-pay | Admitting: Internal Medicine

## 2023-05-03 ENCOUNTER — Ambulatory Visit (INDEPENDENT_AMBULATORY_CARE_PROVIDER_SITE_OTHER): Payer: Medicare Other | Admitting: Internal Medicine

## 2023-05-03 VITALS — BP 188/74 | HR 83 | Ht 64.0 in | Wt 298.0 lb

## 2023-05-03 DIAGNOSIS — E785 Hyperlipidemia, unspecified: Secondary | ICD-10-CM

## 2023-05-03 DIAGNOSIS — E1165 Type 2 diabetes mellitus with hyperglycemia: Secondary | ICD-10-CM | POA: Diagnosis not present

## 2023-05-03 DIAGNOSIS — Z794 Long term (current) use of insulin: Secondary | ICD-10-CM

## 2023-05-03 DIAGNOSIS — E1142 Type 2 diabetes mellitus with diabetic polyneuropathy: Secondary | ICD-10-CM | POA: Diagnosis not present

## 2023-05-03 LAB — POCT GLYCOSYLATED HEMOGLOBIN (HGB A1C): Hemoglobin A1C: 10.2 % — AB (ref 4.0–5.6)

## 2023-05-03 LAB — POCT GLUCOSE (DEVICE FOR HOME USE): Glucose Fasting, POC: 198 mg/dL — AB (ref 70–99)

## 2023-05-03 MED ORDER — TIRZEPATIDE 7.5 MG/0.5ML ~~LOC~~ SOAJ: 7.5000 mg | SUBCUTANEOUS | 3 refills | Status: DC

## 2023-05-03 MED ORDER — BD PEN NEEDLE MICRO U/F 32G X 6 MM MISC: 1.0000 | Freq: Every day | 3 refills | Status: DC

## 2023-05-03 MED ORDER — INSULIN GLARGINE-YFGN 100 UNIT/ML ~~LOC~~ SOLN: 52.0000 [IU] | Freq: Every day | SUBCUTANEOUS | 4 refills | Status: DC

## 2023-05-03 NOTE — Patient Instructions (Addendum)
-   Continue Basaglar/ Semglee 52 Units daily  - Increase mounjaro 7.5 mg once weekly      HOW TO TREAT LOW BLOOD SUGARS (Blood sugar LESS THAN 70 MG/DL) Please follow the RULE OF 15 for the treatment of hypoglycemia treatment (when your (blood sugars are less than 70 mg/dL)   STEP 1: Take 15 grams of carbohydrates when your blood sugar is low, which includes:  3-4 GLUCOSE TABS  OR 3-4 OZ OF JUICE OR REGULAR SODA OR ONE TUBE OF GLUCOSE GEL    STEP 2: RECHECK blood sugar in 15 MINUTES STEP 3: If your blood sugar is still low at the 15 minute recheck --> then, go back to STEP 1 and treat AGAIN with another 15 grams of carbohydrates.

## 2023-05-03 NOTE — Progress Notes (Signed)
Name: Sandra Wood  Age/ Sex: 67 y.o., female   MRN/ DOB: [id number], 1955-12-19     PCP: Etta Grandchild, MD   Reason for Endocrinology Evaluation: Type 2 Diabetes Mellitus  Initial Endocrine Consultative Visit: 11/07/2018    PATIENT IDENTIFIER: Sandra Wood is a 67 y.o. female with a past medical history of HTN, T2DM, Asthma, Hx of Breast Ca (2018). The patient has followed with Endocrinology clinic since 11/07/2018 for consultative assistance with management of her diabetes.  DIABETIC HISTORY:  Sandra Wood was diagnosed with T2DM in 2013. Pt was on diet control until 08/2018 when she was started on Metformin, Rybelsus and Insulin. Her hemoglobin A1c has ranged from 6.6% in 2013, peaking at 12.5% in 2020  On her initial visit to our clinic her A1c was 8.8 % . She was on Rybelsus, tresiba and Metformin  Stopped Metformin 10/2020 due to GI side effects  Attempted to start Jardiance through patient assistance program in January 2023  that she did not fill out    Switch Rybelsus to [facility] 01/2023  SUBJECTIVE:   During the last visit (01/28/2023): A1c 8.9%     Today (05/03/2023): Sandra Wood is here for a follow up on her diabetes management.  She checks  glucose rarely.    She follows with oncology for hx of breast ca   Denies nausea or  vomiting  Denies constipation or diarrhea     HOME DIABETES REGIMEN:  Basaglar  52 units daily  Mounjaro 5 mg weekly       DIABETIC COMPLICATIONS: Microvascular complications:  neuropathy Denies: CKD,  Last eye exam: Completed 2023  Macrovascular complications:   Denies: CAD, PVD, CVA      HISTORY:  Past Medical History:  Past Medical History:  Diagnosis Date   Abnormal Pap smear of cervix 2009   Allergic rhinitis, cause unspecified    Arthritis    Arthritis of ankle BILATERAL / HX FX'S   Asthma, mild persistent    Breast cancer (HCC)    Dyspnea    with much activity   Fibroids 2008   GERD  (gastroesophageal reflux disease)    not current   History of radiation therapy 01/27/17-03/16/17   left breast 1.8 Gy in 28 fractions, left breast boost 2 Gy in 6 fractions   HSV-2 (herpes simplex virus 2) infection    at age 17   Malignant neoplasm of lower-inner quadrant of left breast in female, estrogen receptor positive (HCC) 08/20/2016   Obesity, morbid (HCC)    Personal history of chemotherapy    Personal history of radiation therapy    Pneumonia    2013, 2017   Tear of medial meniscus of knee LEFT   Type II or unspecified type diabetes mellitus without mention of complication, uncontrolled    Type II- has never been on medication   Past Surgical History:  Past Surgical History:  Procedure Laterality Date   BREAST BIOPSY     BREAST BIOPSY Left 03/10/2023   MM LT BREAST BX W LOC DEV 1ST LESION IMAGE BX SPEC STEREO GUIDE 03/10/2023 [facility] MAMMOGRAPHY   BREAST LUMPECTOMY Left 09/22/2016   BREAST LUMPECTOMY WITH RADIOACTIVE SEED AND SENTINEL LYMPH NODE BIOPSY Left 09/22/2016   Procedure: BREAST LUMPECTOMY WITH RADIOACTIVE SEED AND LEFT AXILLARY SENTINEL LYMPH NODE BIOPSY;  Surgeon: Ovidio Kin, MD;  Location: [facility] OR;  Service: General;  Laterality: Left;   CHONDROPLASTY  08/19/2011   Procedure: CHONDROPLASTY;  Surgeon: Drucilla Schmidt, MD;  Location: [facility];  Service: Orthopedics;;  shaving of medial femeral chondral   DILATION AND CURETTAGE OF UTERUS     HYSTEROSCOPY WITH D & C  2008   benign endometrial polyp - Dr. Delia Heady   KNEE ARTHROSCOPY W/ MENISCECTOMY Bilateral 07/07/2010   1 year apart   MENISECTOMY  08/19/2011   Procedure: MENISECTOMY;  Surgeon: Drucilla Schmidt, MD;  Location: [facility];  Service: Orthopedics;;  partial lateral menisectomy   PORT-A-CATH REMOVAL N/A 12/21/2017   Procedure: REMOVAL PORT-A-CATH;  Surgeon: Ovidio Kin, MD;  Location: [facility] OR;  Service: General;  Laterality: N/A;   PORTACATH PLACEMENT N/A 09/22/2016    Procedure: INSERTION PORT-A-CATH;  Surgeon: Ovidio Kin, MD;  Location: [facility] OR;  Service: General;  Laterality: N/A;   TONSILLECTOMY     TUBAL LIGATION  AGE 89   Social History:  reports that she quit smoking about 36 years ago. Her smoking use included cigarettes. She started smoking about 41 years ago. She has never used smokeless tobacco. She reports that she does not currently use alcohol. She reports that she does not use drugs. Family History:  Family History  Problem Relation Age of Onset   Diabetes Mother    Heart disease Father    Cancer Brother 55       prostate   Breast cancer Maternal Grandmother 11     HOME MEDICATIONS: Allergies as of 05/03/2023       Reactions   Metformin And Related    GI upset    No Known Allergies         Medication List        Accurate as of May 03, 2023  9:11 AM. If you have any questions, ask your nurse or doctor.          anastrozole 1 MG tablet Commonly known as: ARIMIDEX Take 1 tablet (1 mg total) by mouth daily.   Basaglar KwikPen 100 UNIT/ML Inject 52 Units into the skin daily.   BD Pen Needle Micro U/F 32G X 6 MM Misc Generic drug: Insulin Pen Needle 1 Device by Does not apply route daily in the afternoon.   blood glucose meter kit and supplies Kit Dispense based on patient and insurance preference. Use up to four times daily as directed. (FOR ICD-9 250.00, 250.01). Check blood sugar BID.   losartan 50 MG tablet Commonly known as: COZAAR TAKE 1 TABLET BY MOUTH EVERY DAY   OneTouch Verio test strip Generic drug: glucose blood Use as instructed   rosuvastatin 20 MG tablet Commonly known as: CRESTOR Take 1 tablet (20 mg total) by mouth daily.   tirzepatide 5 MG/0.5ML Pen Commonly known as: MOUNJARO Inject 5 mg into the skin once a week.         OBJECTIVE:   Vital Signs: BP (!) 188/74 (BP Location: Right Wrist, Patient Position: Sitting, Cuff Size: Large)   Pulse 83   Ht 5\' 4"  (1.626 m)   Wt 298 lb  (135.2 kg)   LMP 06/30/2003   SpO2 98%   BMI 51.15 kg/m   Wt Readings from Last 3 Encounters:  05/03/23 298 lb (135.2 kg)  03/04/23 298 lb 12.8 oz (135.5 kg)  01/28/23 292 lb (132.5 kg)     Exam: General: Pt appears well and is in NAD  Lungs: Clear with good BS bilat   Heart: RRR   Extremities: No pretibial edema.   Neuro: MS is good with appropriate affect, pt is  alert and Ox3       DM foot exam: 05/03/2023    The skin of the feet is intact without sores or ulcerations.Plantar callous formation noted bilaterally The pedal pulses are 2+ on right and 2+ on left. The sensation is intact to a screening 5.07, 10 gram monofilament bilaterally at the heels       DATA REVIEWED:  Lab Results  Component Value Date   HGBA1C 8.9 (A) 01/28/2023   HGBA1C 8.2 (H) 08/27/2022   HGBA1C 8.4 Repeated and verified X2. (H) 04/15/2022    Latest Reference Range & Units 08/27/22 08:47  Sodium 135 - 145 mEq/L 139  Potassium 3.5 - 5.1 mEq/L 4.4  Chloride 96 - 112 mEq/L 102  CO2 19 - 32 mEq/L 27  Glucose 70 - 99 mg/dL 416 (H)  BUN 6 - 23 mg/dL 16  Creatinine 6.06 - 3.01 mg/dL 6.01  Calcium 8.4 - 09.3 mg/dL 9.7  GFR >23.55 mL/min 87.68  Pro B Natriuretic peptide (BNP) 0.0 - 100.0 pg/mL 3.0     Latest Reference Range & Units 04/15/22 08:51  Total CHOL/HDL Ratio  3  Cholesterol 0 - 200 mg/dL 732  HDL Cholesterol >20.25 mg/dL 42.70  LDL (calc) 0 - 99 mg/dL 56  MICROALB/CREAT RATIO 0.0 - 30.0 mg/g 0.9  NonHDL  83.40  Triglycerides 0.0 - 149.0 mg/dL 623.7  VLDL 0.0 - 62.8 mg/dL 31.5    In office BG 176 mg/dL    ASSESSMENT / PLAN / RECOMMENDATIONS:   1) Type 2 Diabetes Mellitus, Poorly controlled, With Neuropathic complications - Most recent A1c of 10.2  %. Goal A1c < 7.0 %.    -Patient continues with worsening glycemic control, she assures me compliance with her medications - Intolerant to  metformin due to GI issues - CGM cost prohibitive  - Jardiance cost-prohibitive , I have  attempted to obtain patient assistance for her, but the patient is skeptical about side effects -She is tolerating Mounjaro, will increase as below -She brought a letter from her insurance company that [employer] is not on the formulary and Semglee is, I have made the switch -Patient encouraged to continue with low carb diet and avoid sugar sweetened beverages   MEDICATIONS: -Continue Basaglar/Semglee  52 Units daily  - Increase Mounjaro 7.5 mg weekly    EDUCATION / INSTRUCTIONS: BG monitoring instructions: Patient is instructed to check her blood sugars 2 times a day, fasting and bedtime. Call [facility] if: BG persistently < 70 I reviewed the Rule of 15 for the treatment of hypoglycemia in detail with the patient. Literature supplied.   2) Dyslipidemia :  -LDL has been at goal in the past  Medication Continue rosuvastatin 20 mg daily   F/U in 4 months    Signed electronically by: Lyndle Herrlich, MD  [facility]  [facility] [address] [place] [place], [place] [address] Phone: [phone] FAX: [phone]   CC: Etta Grandchild, MD [address] [place] [address] Phone: [phone]  Fax: [phone]  Return to Endocrinology clinic as below: Future Appointments  Date Time Provider Department Center  05/24/2023 10:20 AM [facility] NURSE [facility] None  06/17/2023  9:00 AM Cirigliano, Vito V, DO [facility]  10/27/2023  8:30 AM [facility] DX DEXA 1 [facility]  02/03/2024  9:00 AM [facility] NURSE 2 [facility] None  03/06/2024  9:30 AM Serena Croissant, MD [facility] None

## 2023-05-24 ENCOUNTER — Ambulatory Visit (INDEPENDENT_AMBULATORY_CARE_PROVIDER_SITE_OTHER): Payer: Medicare Other

## 2023-05-24 DIAGNOSIS — E538 Deficiency of other specified B group vitamins: Secondary | ICD-10-CM | POA: Diagnosis not present

## 2023-05-24 MED ORDER — CYANOCOBALAMIN 1000 MCG/ML IJ SOLN
1000.0000 ug | Freq: Once | INTRAMUSCULAR | Status: AC
Start: 2023-05-24 — End: 2023-05-24
  Administered 2023-05-24: 1000 ug via INTRAMUSCULAR

## 2023-05-24 NOTE — Progress Notes (Signed)
PT visits today for their B-12 injection. PT informed of what they were receiving and tolerated injection well. PT notified to reach out to office if needed.

## 2023-06-17 ENCOUNTER — Ambulatory Visit: Payer: Medicare Other | Admitting: Gastroenterology

## 2023-06-17 ENCOUNTER — Encounter: Payer: Self-pay | Admitting: Gastroenterology

## 2023-06-17 VITALS — BP 130/78 | HR 72 | Wt 295.6 lb

## 2023-06-17 DIAGNOSIS — E669 Obesity, unspecified: Secondary | ICD-10-CM | POA: Diagnosis not present

## 2023-06-17 DIAGNOSIS — Z7985 Long-term (current) use of injectable non-insulin antidiabetic drugs: Secondary | ICD-10-CM | POA: Diagnosis not present

## 2023-06-17 DIAGNOSIS — Z1211 Encounter for screening for malignant neoplasm of colon: Secondary | ICD-10-CM

## 2023-06-17 DIAGNOSIS — E119 Type 2 diabetes mellitus without complications: Secondary | ICD-10-CM | POA: Diagnosis not present

## 2023-06-17 DIAGNOSIS — Z6841 Body Mass Index (BMI) 40.0 and over, adult: Secondary | ICD-10-CM

## 2023-06-17 DIAGNOSIS — Z79899 Other long term (current) drug therapy: Secondary | ICD-10-CM

## 2023-06-17 DIAGNOSIS — E1142 Type 2 diabetes mellitus with diabetic polyneuropathy: Secondary | ICD-10-CM

## 2023-06-17 MED ORDER — NA SULFATE-K SULFATE-MG SULF 17.5-3.13-1.6 GM/177ML PO SOLN: 1.0000 | ORAL | 0 refills | Status: DC

## 2023-06-17 NOTE — Patient Instructions (Addendum)
_______________________________________________________  If your blood pressure at your visit was 140/90 or greater, please contact your primary care physician to follow up on this. _______________________________________________________  If you are age 68 or older, your body mass index should be between 23-30. Your Body mass index is 50.74 kg/m. If this is out of the aforementioned range listed, please consider follow up with your Primary Care Provider. ________________________________________________________  The [facility] providers would like to encourage you to use [facility] to communicate with providers for non-urgent requests or questions.  Due to long hold times on the telephone, sending your provider a message by [facility] may be a faster and more efficient way to get a response.  Please allow 48 business hours for a response.  Please remember that this is for non-urgent requests.  _______________________________________________________  Bonita Quin have been scheduled for a colonoscopy. Please follow written instructions given to you at your visit today.   Please pick up your prep supplies at the pharmacy within the next 1-3 days.  If you use inhalers (even only as needed), please bring them with you on the day of your procedure.  DO NOT TAKE 7 DAYS PRIOR TO TEST- Trulicity (dulaglutide) Ozempic, Wegovy (semaglutide) Mounjaro (tirzepatide) Bydureon Bcise (exanatide extended release)  DO NOT TAKE 1 DAY PRIOR TO YOUR TEST Rybelsus (semaglutide) Adlyxin (lixisenatide) Victoza (liraglutide) Byetta (exanatide) ___________________________________________________________________________  Due to recent changes in healthcare laws, you may see the results of your imaging and laboratory studies on MyChart before your provider has had a chance to review them.  We understand that in some cases there may be results that are confusing or concerning to you. Not all laboratory results come back in the  same time frame and the provider may be waiting for multiple results in order to interpret others.  Please give [place] 48 hours in order for your provider to thoroughly review all the results before contacting the office for clarification of your results.    It was a pleasure to see you today!  Vito Cirigliano, D.O.

## 2023-06-17 NOTE — Progress Notes (Signed)
Chief Complaint: Colon cancer screening   Referring Provider:     Etta Grandchild, MD   HPI:     REBBECCA TOBER is a 67 y.o. female with a history of breast cancer  s/p lumpectomy, chemotherapy and radiation in 2018 (currently on anastrozole), HTN, diabetes, obesity (BMI  >50), asthma, referred to the Gastroenterology Clinic for evaluation of colon cancer screening.  No prior colon cancer screening. Otherwise, no GI sxs.   She reports that a family history of colon polyps; father with polyps. No Fhx of colon cancer, IBD.   Currently prescribed Mounjaro for the last 3 months.  Will need to hold pre-op.    Reviewed labs from 07/2022: Normal CBC, BMP (BG 145).  A1c 8.2%.  No recent abdominal imaging for review.   Past Medical History:  Diagnosis Date   Abnormal Pap smear of cervix 2009   Allergic rhinitis, cause unspecified    Arthritis    Arthritis of ankle BILATERAL / HX FX'S   Asthma, mild persistent    Breast cancer (HCC)    Dyspnea    with much activity   Fibroids 2008   GERD (gastroesophageal reflux disease)    not current   History of radiation therapy 01/27/17-03/16/17   left breast 1.8 Gy in 28 fractions, left breast boost 2 Gy in 6 fractions   HSV-2 (herpes simplex virus 2) infection    at age 89   Malignant neoplasm of lower-inner quadrant of left breast in female, estrogen receptor positive (HCC) 08/20/2016   Obesity, morbid (HCC)    Personal history of chemotherapy    Personal history of radiation therapy    Pneumonia    2013, 2017   Tear of medial meniscus of knee LEFT   Type II or unspecified type diabetes mellitus without mention of complication, uncontrolled    Type II- has never been on medication     Past Surgical History:  Procedure Laterality Date   BREAST BIOPSY     BREAST BIOPSY Left 03/10/2023   MM LT BREAST BX W LOC DEV 1ST LESION IMAGE BX SPEC STEREO GUIDE 03/10/2023 [facility]   BREAST LUMPECTOMY Left 09/22/2016    BREAST LUMPECTOMY WITH RADIOACTIVE SEED AND SENTINEL LYMPH NODE BIOPSY Left 09/22/2016   Procedure: BREAST LUMPECTOMY WITH RADIOACTIVE SEED AND LEFT AXILLARY SENTINEL LYMPH NODE BIOPSY;  Surgeon: Ovidio Kin, MD;  Location: [facility] OR;  Service: General;  Laterality: Left;   CHONDROPLASTY  08/19/2011   Procedure: CHONDROPLASTY;  Surgeon: Drucilla Schmidt, MD;  Location: [facility];  Service: Orthopedics;;  shaving of medial femeral chondral   DILATION AND CURETTAGE OF UTERUS     HYSTEROSCOPY WITH D & C  2008   benign endometrial polyp - Dr. Delia Heady   KNEE ARTHROSCOPY W/ MENISCECTOMY Bilateral 07/07/2010   1 year apart   MENISECTOMY  08/19/2011   Procedure: MENISECTOMY;  Surgeon: Drucilla Schmidt, MD;  Location: [facility];  Service: Orthopedics;;  partial lateral menisectomy   PORT-A-CATH REMOVAL N/A 12/21/2017   Procedure: REMOVAL PORT-A-CATH;  Surgeon: Ovidio Kin, MD;  Location: [facility] OR;  Service: General;  Laterality: N/A;   PORTACATH PLACEMENT N/A 09/22/2016   Procedure: INSERTION PORT-A-CATH;  Surgeon: Ovidio Kin, MD;  Location: [facility] OR;  Service: General;  Laterality: N/A;   TONSILLECTOMY     TUBAL LIGATION  AGE 50   Family History  Problem Relation Age of  Onset   Diabetes Mother    Heart disease Father    Cancer Brother 60       prostate   Breast cancer Maternal Grandmother 65   Social History   Tobacco Use   Smoking status: Former    Current packs/day: 0.00    Types: Cigarettes    Start date: 08/13/1981    Quit date: 08/13/1986    Years since quitting: 36.8   Smokeless tobacco: Never  Vaping Use   Vaping status: Never Used  Substance Use Topics   Alcohol use: Not Currently   Drug use: No   Current Outpatient Medications  Medication Sig Dispense Refill   anastrozole (ARIMIDEX) 1 MG tablet Take 1 tablet (1 mg total) by mouth daily. 90 tablet 3   blood glucose meter kit and supplies KIT Dispense based on patient and insurance preference. Use  up to four times daily as directed. (FOR ICD-9 250.00, 250.01). Check blood sugar BID. 1 each 0   glucose blood (ONETOUCH VERIO) test strip Use as instructed 100 each 12   Insulin Glargine (BASAGLAR KWIKPEN) 100 UNIT/ML Inject 52 Units into the skin daily. 45 mL 4   insulin glargine-yfgn (SEMGLEE) 100 UNIT/ML injection Inject 0.52 mLs (52 Units total) into the skin daily. 45 mL 4   Insulin Pen Needle (BD PEN NEEDLE MICRO U/F) 32G X 6 MM MISC 1 Device by Does not apply route daily in the afternoon. 100 each 3   losartan (COZAAR) 50 MG tablet TAKE 1 TABLET BY MOUTH EVERY DAY 90 tablet 1   rosuvastatin (CRESTOR) 20 MG tablet Take 1 tablet (20 mg total) by mouth daily. 90 tablet 3   tirzepatide (MOUNJARO) 7.5 MG/0.5ML Pen Inject 7.5 mg into the skin once a week. 6 mL 3   No current facility-administered medications for this visit.   Allergies  Allergen Reactions   Metformin And Related     GI upset    No Known Allergies      Review of Systems: All systems reviewed and negative except where noted in HPI.     Physical Exam:    Wt Readings from Last 3 Encounters:  05/03/23 298 lb (135.2 kg)  03/04/23 298 lb 12.8 oz (135.5 kg)  01/28/23 292 lb (132.5 kg)    LMP 06/30/2003  Constitutional:  Pleasant, in no acute distress. Psychiatric: Normal mood and affect. Behavior is normal. Cardiovascular: Normal rate, regular rhythm. No edema Pulmonary/chest: Effort normal and breath sounds normal. No wheezing, rales or rhonchi. Abdominal: Soft, nondistended, nontender. Bowel sounds active throughout.  Neurological: Alert and oriented to person place and time. Skin: Skin is warm and dry. No rashes noted.   ASSESSMENT AND PLAN;   1) Colon cancer screening 67 year old female due for CRC screening.  No previous colonoscopy.  Discussed screening options to include optical colonoscopy, virtual colonoscopy, Cologuard testing, FIT kit testing, and she would like to proceed with optical  colonoscopy. - Schedule colonoscopy to be done at [facility] - Will provide Dulcolax 10 mg bid x 2 days prior to colonoscopy to facilitate prep along with bowel prep evening prior as routine - Hold Mounjaro 1 week prior to procedure  2) Diabetes 3) Medication management - Will need to hold Mounjaro 1 week prior to colonoscopy.  She will discuss with her Endocrinologist re: diabetes management in the perioperative setting with Mounjaro hold  4) Obesity (BMI 50.7) - Procedures to be scheduled at [facility] due to elevated periprocedural risks from underlying  comorbidities  The indications, risks, and benefits of colonoscopy were explained to the patient in detail. Risks include but are not limited to bleeding, perforation, adverse reaction to medications, and cardiopulmonary compromise. Sequelae include but are not limited to the possibility of surgery, hospitalization, and mortality. The patient verbalized understanding and wished to proceed. All questions answered, referred to the scheduler and bowel prep ordered. Further recommendations pending results of the exam.     Shellia Cleverly, DO, FACG  06/17/2023, 8:44 AM   Etta Grandchild, MD

## 2023-06-21 ENCOUNTER — Ambulatory Visit: Payer: Medicare Other | Admitting: Internal Medicine

## 2023-06-25 ENCOUNTER — Ambulatory Visit (INDEPENDENT_AMBULATORY_CARE_PROVIDER_SITE_OTHER): Payer: Medicare Other

## 2023-06-25 DIAGNOSIS — E538 Deficiency of other specified B group vitamins: Secondary | ICD-10-CM

## 2023-06-25 MED ORDER — CYANOCOBALAMIN 1000 MCG/ML IJ SOLN
1000.0000 ug | Freq: Once | INTRAMUSCULAR | Status: AC
Start: 2023-06-25 — End: 2023-06-25
  Administered 2023-06-25: 1000 ug via INTRAMUSCULAR

## 2023-06-25 NOTE — Progress Notes (Signed)
Pt here for monthly B12 injection per   B12 given IM and pt tolerated injection well.  Patient has received injection in left arm and responded well.

## 2023-07-19 ENCOUNTER — Encounter: Payer: Self-pay | Admitting: Hematology and Oncology

## 2023-07-20 LAB — HM DIABETES EYE EXAM

## 2023-07-26 ENCOUNTER — Ambulatory Visit (INDEPENDENT_AMBULATORY_CARE_PROVIDER_SITE_OTHER): Payer: Medicare Other

## 2023-07-26 DIAGNOSIS — E538 Deficiency of other specified B group vitamins: Secondary | ICD-10-CM | POA: Diagnosis not present

## 2023-07-26 MED ORDER — CYANOCOBALAMIN 1000 MCG/ML IJ SOLN
1000.0000 ug | Freq: Once | INTRAMUSCULAR | Status: AC
Start: 2023-07-26 — End: 2023-07-26
  Administered 2023-07-26: 1000 ug via INTRAMUSCULAR

## 2023-07-26 NOTE — Progress Notes (Signed)
Pt here for monthly B12 injection per Dr. Yetta Barre  B12 given IM and pt tolerated injection well.  Next B12 injection scheduled for 2/27   Advised patient to report to office immediately if she notices any adverse reactions. Gave a verbal understanding.

## 2023-07-30 ENCOUNTER — Telehealth: Payer: Self-pay | Admitting: Gastroenterology

## 2023-07-30 NOTE — Telephone Encounter (Signed)
Patient requests instructions for colonoscopy be made available via mychart. This has been made available. She also states that pharmacy has not filled her suprep prescription. Contacted [facility] and they ran prescription through. No charge. Also should provide patient with dulcolax 5 mg tablets from [facility] at the same time as her prescription.

## 2023-07-30 NOTE — Telephone Encounter (Signed)
Patient called and stated that she would like to go over her instruction for her procedure.Patient has her procedure for Feb 18th. Patient is requesting a call back. Please advise.

## 2023-08-10 ENCOUNTER — Encounter (HOSPITAL_COMMUNITY): Payer: Self-pay | Admitting: Gastroenterology

## 2023-08-10 ENCOUNTER — Encounter: Payer: Self-pay | Admitting: Hematology and Oncology

## 2023-08-11 ENCOUNTER — Encounter: Payer: Self-pay | Admitting: Hematology and Oncology

## 2023-08-17 ENCOUNTER — Encounter (HOSPITAL_COMMUNITY): Payer: Self-pay | Admitting: Gastroenterology

## 2023-08-17 ENCOUNTER — Ambulatory Visit (HOSPITAL_COMMUNITY)
Admission: RE | Admit: 2023-08-17 | Discharge: 2023-08-17 | Disposition: A | Discharge status: Home / Self Care | Payer: Medicare Other | Attending: Gastroenterology | Admitting: Gastroenterology

## 2023-08-17 ENCOUNTER — Ambulatory Visit (HOSPITAL_BASED_OUTPATIENT_CLINIC_OR_DEPARTMENT_OTHER): Payer: Medicare Other | Admitting: Certified Registered"

## 2023-08-17 ENCOUNTER — Encounter: Payer: Self-pay | Admitting: Hematology and Oncology

## 2023-08-17 ENCOUNTER — Other Ambulatory Visit: Payer: Self-pay

## 2023-08-17 ENCOUNTER — Encounter (HOSPITAL_COMMUNITY)
Admission: RE | Disposition: A | Discharge status: Home / Self Care | Payer: Self-pay | Source: Home / Self Care | Attending: Gastroenterology

## 2023-08-17 ENCOUNTER — Ambulatory Visit (HOSPITAL_COMMUNITY): Payer: Self-pay | Admitting: Certified Registered"

## 2023-08-17 DIAGNOSIS — E119 Type 2 diabetes mellitus without complications: Secondary | ICD-10-CM | POA: Diagnosis not present

## 2023-08-17 DIAGNOSIS — I10 Essential (primary) hypertension: Secondary | ICD-10-CM | POA: Diagnosis not present

## 2023-08-17 DIAGNOSIS — D125 Benign neoplasm of sigmoid colon: Secondary | ICD-10-CM | POA: Diagnosis not present

## 2023-08-17 DIAGNOSIS — Z5986 Financial insecurity: Secondary | ICD-10-CM | POA: Insufficient documentation

## 2023-08-17 DIAGNOSIS — D122 Benign neoplasm of ascending colon: Secondary | ICD-10-CM | POA: Diagnosis not present

## 2023-08-17 DIAGNOSIS — Z7985 Long-term (current) use of injectable non-insulin antidiabetic drugs: Secondary | ICD-10-CM | POA: Diagnosis not present

## 2023-08-17 DIAGNOSIS — K621 Rectal polyp: Secondary | ICD-10-CM

## 2023-08-17 DIAGNOSIS — Z87891 Personal history of nicotine dependence: Secondary | ICD-10-CM | POA: Insufficient documentation

## 2023-08-17 DIAGNOSIS — K219 Gastro-esophageal reflux disease without esophagitis: Secondary | ICD-10-CM | POA: Diagnosis not present

## 2023-08-17 DIAGNOSIS — Z794 Long term (current) use of insulin: Secondary | ICD-10-CM | POA: Diagnosis not present

## 2023-08-17 DIAGNOSIS — Z1211 Encounter for screening for malignant neoplasm of colon: Secondary | ICD-10-CM

## 2023-08-17 DIAGNOSIS — Z79899 Other long term (current) drug therapy: Secondary | ICD-10-CM | POA: Insufficient documentation

## 2023-08-17 DIAGNOSIS — K641 Second degree hemorrhoids: Secondary | ICD-10-CM

## 2023-08-17 DIAGNOSIS — Z7951 Long term (current) use of inhaled steroids: Secondary | ICD-10-CM | POA: Diagnosis not present

## 2023-08-17 DIAGNOSIS — D128 Benign neoplasm of rectum: Secondary | ICD-10-CM | POA: Insufficient documentation

## 2023-08-17 DIAGNOSIS — J449 Chronic obstructive pulmonary disease, unspecified: Secondary | ICD-10-CM | POA: Insufficient documentation

## 2023-08-17 DIAGNOSIS — K648 Other hemorrhoids: Secondary | ICD-10-CM | POA: Diagnosis not present

## 2023-08-17 DIAGNOSIS — K5520 Angiodysplasia of colon without hemorrhage: Secondary | ICD-10-CM | POA: Insufficient documentation

## 2023-08-17 HISTORY — PX: POLYPECTOMY: SHX5525

## 2023-08-17 HISTORY — PX: COLONOSCOPY WITH PROPOFOL: SHX5780

## 2023-08-17 LAB — GLUCOSE, CAPILLARY: Glucose-Capillary: 169 mg/dL — ABNORMAL HIGH (ref 70–99)

## 2023-08-17 SURGERY — COLONOSCOPY WITH PROPOFOL
Anesthesia: Monitor Anesthesia Care

## 2023-08-17 MED ORDER — SODIUM CHLORIDE 0.9 % IV SOLN: INTRAVENOUS | Status: DC

## 2023-08-17 MED ORDER — PROPOFOL 500 MG/50ML IV EMUL
INTRAVENOUS | Status: DC | PRN
  Administered 2023-08-17: 150 ug/kg/min via INTRAVENOUS

## 2023-08-17 MED ORDER — LIDOCAINE 2% (20 MG/ML) 5 ML SYRINGE
INTRAMUSCULAR | Status: DC | PRN
  Administered 2023-08-17: 100 mg via INTRAVENOUS

## 2023-08-17 MED ORDER — PROPOFOL 10 MG/ML IV BOLUS
INTRAVENOUS | Status: DC | PRN
  Administered 2023-08-17: 20 mg via INTRAVENOUS

## 2023-08-17 SURGICAL SUPPLY — 20 items
ELECT REM PT RETURN 9FT ADLT (ELECTROSURGICAL)
ELECTRODE REM PT RTRN 9FT ADLT (ELECTROSURGICAL) IMPLANT
FLOOR PAD 36X40 (MISCELLANEOUS) ×2
FORCEPS BIOP RAD 4 LRG CAP 4 (CUTTING FORCEPS) IMPLANT
FORCEPS BIOP RJ4 240 W/NDL (CUTTING FORCEPS)
FORCEPS BXJMBJMB 240X2.8X (CUTTING FORCEPS) IMPLANT
INJECTOR/SNARE I SNARE (MISCELLANEOUS) IMPLANT
LUBRICANT JELLY 4.5OZ STERILE (MISCELLANEOUS) IMPLANT
MANIFOLD NEPTUNE II (INSTRUMENTS) IMPLANT
NDL SCLEROTHERAPY 25GX240 (NEEDLE) IMPLANT
NEEDLE SCLEROTHERAPY 25GX240 (NEEDLE)
PAD FLOOR 36X40 (MISCELLANEOUS) ×2 IMPLANT
PROBE APC STR FIRE (PROBE) IMPLANT
PROBE INJECTION GOLD 7FR (MISCELLANEOUS) IMPLANT
SNARE ROTATE MED OVAL 20MM (MISCELLANEOUS) IMPLANT
SYR 50ML LL SCALE MARK (SYRINGE) IMPLANT
TRAP SPECIMEN MUCOUS 40CC (MISCELLANEOUS) IMPLANT
TUBING ENDO SMARTCAP PENTAX (MISCELLANEOUS) IMPLANT
TUBING IRRIGATION ENDOGATOR (MISCELLANEOUS) ×2 IMPLANT
WATER STERILE IRR 1000ML POUR (IV SOLUTION) IMPLANT

## 2023-08-17 NOTE — H&P (Signed)
GASTROENTEROLOGY PROCEDURE H&P NOTE   Primary Care Physician: Etta Grandchild, MD    Reason for Procedure:  Colon cancer screening  Plan:    Colonoscopy   Patient is appropriate for endoscopic procedure(s) at [facility].  The nature of the procedure, as well as the risks, benefits, and alternatives were carefully and thoroughly reviewed with the patient. Ample time for discussion and questions allowed. The patient understood, was satisfied, and agreed to proceed.     HPI: Sandra Wood is a 68 y.o. female who presents for colonoscopy for colon cancer screening.  No previous colonoscopy.  No recent GI symptoms.  Has been holding Mounjaro in anticipation of procedure today.  Procedure scheduled at [facility] due to elevated periprocedural risks from underlying comorbidities.  Past Medical History:  Diagnosis Date   Abnormal Pap smear of cervix 2009   Allergic rhinitis, cause unspecified    Arthritis    Arthritis of ankle BILATERAL / HX FX'S   Asthma, mild persistent    Breast cancer (HCC)    Dyspnea    with much activity   Fibroids 2008   GERD (gastroesophageal reflux disease)    not current   History of radiation therapy 01/27/17-03/16/17   left breast 1.8 Gy in 28 fractions, left breast boost 2 Gy in 6 fractions   HSV-2 (herpes simplex virus 2) infection    at age 58   Malignant neoplasm of lower-inner quadrant of left breast in female, estrogen receptor positive (HCC) 08/20/2016   Obesity, morbid (HCC)    Personal history of chemotherapy    Personal history of radiation therapy    Pneumonia    2013, 2017   Tear of medial meniscus of knee LEFT   Type II or unspecified type diabetes mellitus without mention of complication, uncontrolled    Type II- has never been on medication    Past Surgical History:  Procedure Laterality Date   BREAST BIOPSY     BREAST BIOPSY Left 03/10/2023   MM LT BREAST BX W LOC DEV 1ST LESION IMAGE BX  SPEC STEREO GUIDE 03/10/2023 [facility]   BREAST LUMPECTOMY Left 09/22/2016   BREAST LUMPECTOMY WITH RADIOACTIVE SEED AND SENTINEL LYMPH NODE BIOPSY Left 09/22/2016   Procedure: BREAST LUMPECTOMY WITH RADIOACTIVE SEED AND LEFT AXILLARY SENTINEL LYMPH NODE BIOPSY;  Surgeon: Ovidio Kin, MD;  Location: [facility] OR;  Service: General;  Laterality: Left;   CHONDROPLASTY  08/19/2011   Procedure: CHONDROPLASTY;  Surgeon: Drucilla Schmidt, MD;  Location: [facility];  Service: Orthopedics;;  shaving of medial femeral chondral   DILATION AND CURETTAGE OF UTERUS     HYSTEROSCOPY WITH D & C  2008   benign endometrial polyp - Dr. Delia Heady   KNEE ARTHROSCOPY W/ MENISCECTOMY Bilateral 07/07/2010   1 year apart   MENISECTOMY  08/19/2011   Procedure: MENISECTOMY;  Surgeon: Drucilla Schmidt, MD;  Location: [facility];  Service: Orthopedics;;  partial lateral menisectomy   PORT-A-CATH REMOVAL N/A 12/21/2017   Procedure: REMOVAL PORT-A-CATH;  Surgeon: Ovidio Kin, MD;  Location: [facility] OR;  Service: General;  Laterality: N/A;   PORTACATH PLACEMENT N/A 09/22/2016   Procedure: INSERTION PORT-A-CATH;  Surgeon: Ovidio Kin, MD;  Location: [facility] OR;  Service: General;  Laterality: N/A;   TONSILLECTOMY     TUBAL LIGATION  AGE 34    Prior to Admission medications   Medication Sig Start Date End Date Taking? Authorizing Provider  anastrozole (  ARIMIDEX) 1 MG tablet Take 1 tablet (1 mg total) by mouth daily. 03/04/23  Yes Serena Croissant, MD  blood glucose meter kit and supplies KIT Dispense based on patient and insurance preference. Use up to four times daily as directed. (FOR ICD-9 250.00, 250.01). Check blood sugar BID. 12/21/16  Yes Causey, Larna Daughters, NP  glucose blood (ONETOUCH VERIO) test strip Use as instructed 06/10/20  Yes Shamleffer, Konrad Dolores, MD  Insulin Glargine (BASAGLAR KWIKPEN) 100 UNIT/ML Inject 52 Units into the skin daily. 01/28/23  Yes Shamleffer, Konrad Dolores,  MD  Insulin Pen Needle (BD PEN NEEDLE MICRO U/F) 32G X 6 MM MISC 1 Device by Does not apply route daily in the afternoon. 05/03/23  Yes Shamleffer, Konrad Dolores, MD  losartan (COZAAR) 50 MG tablet TAKE 1 TABLET BY MOUTH EVERY DAY 10/25/22  Yes Etta Grandchild, MD  Na Sulfate-K Sulfate-Mg Sulf (SUPREP BOWEL PREP KIT) 17.5-3.13-1.6 GM/177ML SOLN Take 1 kit by mouth as directed. 06/17/23  Yes Carver Murakami V, DO  rosuvastatin (CRESTOR) 20 MG tablet Take 1 tablet (20 mg total) by mouth daily. 01/28/23  Yes Shamleffer, Konrad Dolores, MD  tirzepatide [facility]) 7.5 MG/0.5ML Pen Inject 7.5 mg into the skin once a week. 05/03/23   Shamleffer, Konrad Dolores, MD  prochlorperazine (COMPAZINE) 10 MG tablet Take 1 tablet (10 mg total) by mouth every 6 (six) hours as needed (Nausea or vomiting). Patient not taking: Reported on 01/11/2017 10/05/16 02/15/17  Serena Croissant, MD    Current Facility-Administered Medications  Medication Dose Route Frequency Provider Last Rate Last Admin   0.9 %  sodium chloride infusion   Intravenous Continuous Dareld Mcauliffe V, DO 20 mL/hr at 08/17/23 1001 New Bag at 08/17/23 1001    Allergies as of 06/17/2023 - Review Complete 06/17/2023  Allergen Reaction Noted   Metformin and related  12/25/2020   No known allergies  09/21/2016    Family History  Problem Relation Age of Onset   Diabetes Mother    Heart disease Father    Colon polyps Father    Cancer Brother 67       prostate   Breast cancer Maternal Grandmother 64   Colon cancer Neg Hx    Esophageal cancer Neg Hx    Stomach cancer Neg Hx     Social History   Socioeconomic History   Marital status: Divorced    Spouse name: Not on file   Number of children: 2   Years of education: Not on file   Highest education level: Not on file  Occupational History   Occupation: retired  Tobacco Use   Smoking status: Former    Current packs/day: 0.00    Types: Cigarettes    Start date: 08/13/1981    Quit date:  08/13/1986    Years since quitting: 37.0   Smokeless tobacco: Never  Vaping Use   Vaping status: Never Used  Substance and Sexual Activity   Alcohol use: Not Currently   Drug use: No   Sexual activity: Not Currently    Birth control/protection: Post-menopausal, Abstinence  Other Topics Concern   Not on file  Social History Narrative   Not on file   Social Drivers of Health   Financial Resource Strain: High Risk (01/28/2023)   Overall Financial Resource Strain (CARDIA)    Difficulty of Paying Living Expenses: Very hard  Food Insecurity: No Food Insecurity (01/28/2023)   Hunger Vital Sign    Worried About Running Out of Food in the Last Year: Never  true    Ran Out of Food in the Last Year: Never true  Transportation Needs: No Transportation Needs (01/28/2023)   PRAPARE - Administrator, Civil Service (Medical): No    Lack of Transportation (Non-Medical): No  Physical Activity: Inactive (01/28/2023)   Exercise Vital Sign    Days of Exercise per Week: 0 days    Minutes of Exercise per Session: 0 min  Stress: No Stress Concern Present (01/28/2023)   [employer] of Occupational Health - Occupational Stress Questionnaire    Feeling of Stress : Only a little  Social Connections: Socially Isolated (01/28/2023)   Social Connection and Isolation Panel [NHANES]    Frequency of Communication with Friends and Family: More than three times a week    Frequency of Social Gatherings with Friends and Family: More than three times a week    Attends Religious Services: Never    Database administrator or Organizations: No    Attends Banker Meetings: Never    Marital Status: Divorced  Catering manager Violence: Not At Risk (01/28/2023)   Humiliation, Afraid, Rape, and Kick questionnaire    Fear of Current or Ex-Partner: No    Emotionally Abused: No    Physically Abused: No    Sexually Abused: No    Physical Exam: Vital signs in last 24 hours: @BP  (!) 174/65   Pulse 83    Temp 98.6 F (37 C) (Temporal)   Resp (!) 22   Ht 5\' 4"  (1.626 m)   Wt 134 kg   LMP 06/30/2003   SpO2 94%   BMI 50.71 kg/m  GEN: NAD EYE: Sclerae anicteric ENT: MMM CV: Non-tachycardic Pulm: CTA b/l GI: Soft, NT/ND NEURO:  Alert & Oriented x 3   Doristine Locks, DO [facility]   08/17/2023 10:02 AM

## 2023-08-17 NOTE — Discharge Instructions (Signed)

## 2023-08-17 NOTE — Op Note (Signed)
[facility] Patient Name: Sandra Wood Procedure Date: 08/17/2023 MRN: [id number] Attending MD: Doristine Locks , MD, [id number] Date of Birth: [birth year] CSN: [id number] Age: 68 Admit Type: Outpatient Procedure:                Colonoscopy Indications:              Screening for colorectal malignant neoplasm, This                            is the patient's first colonoscopy Providers:                Doristine Locks, MD, Fransisca Connors, Rhodia Albright,                            Technician Referring MD:              Medicines:                Monitored Anesthesia Care Complications:            No immediate complications. Estimated Blood Loss:     Estimated blood loss was minimal. Procedure:                Pre-Anesthesia Assessment:                           - Prior to the procedure, a History and Physical                            was performed, and patient medications and                            allergies were reviewed. The patient's tolerance of                            previous anesthesia was also reviewed. The risks                            and benefits of the procedure and the sedation                            options and risks were discussed with the patient.                            All questions were answered, and informed consent                            was obtained. Prior Anticoagulants: The patient has                            taken no anticoagulant or antiplatelet agents. ASA                            Grade Assessment: III - A patient with severe  systemic disease. After reviewing the risks and                            benefits, the patient was deemed in satisfactory                            condition to undergo the procedure.                           After obtaining informed consent, the colonoscope                            was passed under direct vision. Throughout the                             procedure, the patient's blood pressure, pulse, and                            oxygen saturations were monitored continuously. The                            CF-HQ190L ([id number] Olympus colonoscope was                            introduced through the anus and advanced to the the                            cecum, identified by appendiceal orifice and                            ileocecal valve. The colonoscopy was performed                            without difficulty. The patient tolerated the                            procedure well. The quality of the bowel                            preparation was good. The ileocecal valve,                            appendiceal orifice, and rectum were photographed. Scope In: 11:37:16 AM Scope Out: 11:56:45 AM Scope Withdrawal Time: 0 hours 17 minutes 11 seconds  Total Procedure Duration: 0 hours 19 minutes 29 seconds  Findings:      The perianal and digital rectal examinations were normal.      Two sessile polyps were found in the ascending colon. The polyps were 3       to 5 mm in size. These polyps were removed with a cold snare. Resection       and retrieval were complete. Estimated blood loss was minimal.      A 12 mm polyp was found in the sigmoid colon. The polyp was       pedunculated. The polyp was removed with a  hot snare and removed en       bloc. Resection and retrieval were complete. Estimated blood loss: none.      Two sessile polyps were found in the rectum. The polyps were 2 to 3 mm       in size. These polyps were removed with a cold snare. Resection and       retrieval were complete. Estimated blood loss was minimal.      Non-bleeding internal hemorrhoids were found during retroflexion. The       hemorrhoids were small.      A single small angioectasia with typical arborization was found in the       cecum. Impression:               - Two 3 to 5 mm polyps in the ascending colon,                            removed with a cold  snare. Resected and retrieved.                           - One 12 mm polyp in the sigmoid colon, removed                            with a hot snare. Resected and retrieved.                           - Two 2 to 3 mm polyps in the rectum, removed with                            a cold snare. Resected and retrieved.                           - Non-bleeding internal hemorrhoids.                           - A single, small, non-bleeding colonic                            angioectasia. Moderate Sedation:      Not Applicable - Patient had care per Anesthesia. Recommendation:           - Patient has a contact number available for                            emergencies. The signs and symptoms of potential                            delayed complications were discussed with the                            patient. Return to normal activities tomorrow.                            Written discharge instructions were provided to the  patient.                           - Resume previous diet.                           - Continue present medications.                           - Await pathology results.                           - Repeat colonoscopy for surveillance based on                            pathology results.                           - Return to GI clinic PRN. Procedure Code(s):        --- Professional ---                           [phone], Colonoscopy, flexible; with removal of                            tumor(s), polyp(s), or other lesion(s) by snare                            technique Diagnosis Code(s):        --- Professional ---                           Z12.11, Encounter for screening for malignant                            neoplasm of colon                           D12.2, Benign neoplasm of ascending colon                           D12.8, Benign neoplasm of rectum                           D12.5, Benign neoplasm of sigmoid colon                            K55.20, Angiodysplasia of colon without hemorrhage                           K64.8, Other hemorrhoids CPT copyright 2022 American Medical Association. All rights reserved. The codes documented in this report are preliminary and upon coder review may  be revised to meet current compliance requirements. Doristine Locks, MD 08/17/2023 12:05:25 PM Number of Addenda: 0

## 2023-08-17 NOTE — Anesthesia Postprocedure Evaluation (Signed)
Anesthesia Post Note  Patient: Sandra Wood  Procedure(s) Performed: COLONOSCOPY WITH PROPOFOL POLYPECTOMY     Patient location during evaluation: Endoscopy Anesthesia Type: MAC Level of consciousness: oriented, patient cooperative and awake and alert Respiratory status: respiratory function stable, nonlabored ventilation and spontaneous breathing Cardiovascular status: blood pressure returned to baseline and stable Postop Assessment: able to ambulate and no apparent nausea or vomiting Anesthetic complications: no   No notable events documented.  Last Vitals:  Vitals:   08/17/23 1215 08/17/23 1220  BP:  (!) 143/62  Pulse: 82 77  Resp: 18 18  Temp:    SpO2: 96% 97%    Last Pain:  Vitals:   08/17/23 1220  TempSrc:   PainSc: 0-No pain                 Shyloh Derosa,E. Nattaly Yebra

## 2023-08-17 NOTE — Transfer of Care (Signed)
Immediate Anesthesia Transfer of Care Note  Patient: Sandra Wood  Procedure(s) Performed: COLONOSCOPY WITH PROPOFOL POLYPECTOMY  Patient Location: PACU and Endoscopy Unit  Anesthesia Type:MAC  Level of Consciousness: awake, alert , and patient cooperative  Airway & Oxygen Therapy: Patient Spontanous Breathing and Patient connected to face mask oxygen  Post-op Assessment: Report given to RN and Post -op Vital signs reviewed and stable  Post vital signs: Reviewed and stable  Last Vitals:  Vitals Value Taken Time  BP 148/114   Temp    Pulse 85 08/17/23 1204  Resp 16 08/17/23 1204  SpO2 100 % 08/17/23 1204    Last Pain:  Vitals:   08/17/23 1204  TempSrc:   PainSc: 0-No pain         Complications: No notable events documented.

## 2023-08-17 NOTE — Interval H&P Note (Signed)
History and Physical Interval Note:  08/17/2023 10:03 AM  Sandra Wood  has presented today for surgery, with the diagnosis of colon cancer screening.  The various methods of treatment have been discussed with the patient and family. After consideration of risks, benefits and other options for treatment, the patient has consented to  Procedure(s): COLONOSCOPY WITH PROPOFOL (N/A) as a surgical intervention.  The patient's history has been reviewed, patient examined, no change in status, stable for surgery.  I have reviewed the patient's chart and labs.  Questions were answered to the patient's satisfaction.     Verlin Dike Tucker Steedley

## 2023-08-17 NOTE — Anesthesia Preprocedure Evaluation (Addendum)
Anesthesia Evaluation  Patient identified by MRN, date of birth, ID band Patient awake    Reviewed: Allergy & Precautions, NPO status , Patient's Chart, lab work & pertinent test results  History of Anesthesia Complications Negative for: history of anesthetic complications  Airway Mallampati: II  TM Distance: >3 FB Neck ROM: Full    Dental  (+) Dental Advisory Given   Pulmonary shortness of breath, COPD,  COPD inhaler, former smoker   breath sounds clear to auscultation       Cardiovascular hypertension, Pt. on medications (-) angina  Rhythm:Regular Rate:Normal  '19 ECHO: mild LVH. Systolic function was normal. EF 60% to 65%.  - Wall motion was normal; there were no regional wall motion abnormalities. (grade 1 diastolic dysfunction). GLS -19.8%.  - Aortic valve: There was no stenosis.  - Mitral valve: There was no regurgitation.  - Right ventricle: The cavity size was normal. Systolic function was normal.     Neuro/Psych    Depression     Neuromuscular disease    GI/Hepatic Neg liver ROS,GERD  Medicated and Controlled,,  Endo/Other  diabetes, Insulin Dependent  Mounjaro: last 08/01/2023 BMI 50.7  Renal/GU negative Renal ROS     Musculoskeletal   Abdominal   Peds  Hematology   Anesthesia Other Findings Breast cancer  Reproductive/Obstetrics                             Anesthesia Physical Anesthesia Plan  ASA: 3  Anesthesia Plan: MAC   Post-op Pain Management: Minimal or no pain anticipated   Induction:   PONV Risk Score and Plan: 2 and Treatment may vary due to age or medical condition and TIVA  Airway Management Planned: Natural Airway and Simple Face Mask  Additional Equipment: None  Intra-op Plan:   Post-operative Plan:   Informed Consent: I have reviewed the patients History and Physical, chart, labs and discussed the procedure including the risks, benefits and  alternatives for the proposed anesthesia with the patient or authorized representative who has indicated his/her understanding and acceptance.     Dental advisory given  Plan Discussed with: CRNA and Surgeon  Anesthesia Plan Comments:         Anesthesia Quick Evaluation

## 2023-08-17 NOTE — Anesthesia Procedure Notes (Signed)
Procedure Name: MAC Date/Time: 08/17/2023 11:30 AM  Performed by: Sindy Guadeloupe, CRNAPre-anesthesia Checklist: Patient identified, Emergency Drugs available, Suction available, Patient being monitored and Timeout performed Oxygen Delivery Method: Simple face mask Placement Confirmation: positive ETCO2

## 2023-08-18 LAB — SURGICAL PATHOLOGY

## 2023-08-19 ENCOUNTER — Encounter: Payer: Self-pay | Admitting: Gastroenterology

## 2023-08-20 ENCOUNTER — Encounter (HOSPITAL_COMMUNITY): Payer: Self-pay | Admitting: Gastroenterology

## 2023-08-26 ENCOUNTER — Ambulatory Visit (INDEPENDENT_AMBULATORY_CARE_PROVIDER_SITE_OTHER): Payer: Medicare Other

## 2023-08-26 DIAGNOSIS — E538 Deficiency of other specified B group vitamins: Secondary | ICD-10-CM | POA: Diagnosis not present

## 2023-08-26 MED ORDER — CYANOCOBALAMIN 1000 MCG/ML IJ SOLN
1000.0000 ug | Freq: Once | INTRAMUSCULAR | Status: AC
Start: 2023-08-26 — End: 2023-08-26
  Administered 2023-08-26: 1000 ug via INTRAMUSCULAR

## 2023-08-26 NOTE — Progress Notes (Signed)
Pt here for monthly B12 injection per Dr. Yetta Barre  B12 given IM. and pt tolerated injection well.

## 2023-09-01 ENCOUNTER — Ambulatory Visit: Payer: Medicare Other | Admitting: Internal Medicine

## 2023-09-01 ENCOUNTER — Encounter: Payer: Self-pay | Admitting: Internal Medicine

## 2023-09-01 ENCOUNTER — Telehealth: Payer: Self-pay | Admitting: Internal Medicine

## 2023-09-01 VITALS — BP 124/80 | HR 90 | Ht 64.0 in | Wt 297.0 lb

## 2023-09-01 DIAGNOSIS — E1165 Type 2 diabetes mellitus with hyperglycemia: Secondary | ICD-10-CM

## 2023-09-01 DIAGNOSIS — E1142 Type 2 diabetes mellitus with diabetic polyneuropathy: Secondary | ICD-10-CM

## 2023-09-01 DIAGNOSIS — E785 Hyperlipidemia, unspecified: Secondary | ICD-10-CM

## 2023-09-01 DIAGNOSIS — Z794 Long term (current) use of insulin: Secondary | ICD-10-CM | POA: Diagnosis not present

## 2023-09-01 LAB — POCT GLYCOSYLATED HEMOGLOBIN (HGB A1C): Hemoglobin A1C: 10.6 % — AB (ref 4.0–5.6)

## 2023-09-01 LAB — POCT GLUCOSE (DEVICE FOR HOME USE): Glucose Fasting, POC: 203 mg/dL — AB (ref 70–99)

## 2023-09-01 MED ORDER — TIRZEPATIDE 10 MG/0.5ML ~~LOC~~ SOAJ: 10.0000 mg | SUBCUTANEOUS | 3 refills | Status: DC

## 2023-09-01 MED ORDER — BD PEN NEEDLE MICRO U/F 32G X 6 MM MISC: 1.0000 | Freq: Every day | 3 refills | Status: DC

## 2023-09-01 MED ORDER — ROSUVASTATIN CALCIUM 20 MG PO TABS: 20.0000 mg | ORAL_TABLET | Freq: Every day | ORAL | 3 refills | Status: AC

## 2023-09-01 MED ORDER — BASAGLAR KWIKPEN 100 UNIT/ML ~~LOC~~ SOPN: 60.0000 [IU] | PEN_INJECTOR | Freq: Every day | SUBCUTANEOUS | 4 refills | Status: DC

## 2023-09-01 NOTE — Patient Instructions (Addendum)
-   Increase  Basaglar 60 Units daily  - Increase mounjaro 10 mg once weekly      HOW TO TREAT LOW BLOOD SUGARS (Blood sugar LESS THAN 70 MG/DL) Please follow the RULE OF 15 for the treatment of hypoglycemia treatment (when your (blood sugars are less than 70 mg/dL)   STEP 1: Take 15 grams of carbohydrates when your blood sugar is low, which includes:  3-4 GLUCOSE TABS  OR 3-4 OZ OF JUICE OR REGULAR SODA OR ONE TUBE OF GLUCOSE GEL    STEP 2: RECHECK blood sugar in 15 MINUTES STEP 3: If your blood sugar is still low at the 15 minute recheck --> then, go back to STEP 1 and treat AGAIN with another 15 grams of carbohydrates.

## 2023-09-01 NOTE — Progress Notes (Signed)
 Name: Sandra Wood  Age/ Sex: 68 y.o., female   MRN/ DOB: [id number], 1956-05-28     PCP: Etta Grandchild, MD   Reason for Endocrinology Evaluation: Type 2 Diabetes Mellitus  Initial Endocrine Consultative Visit: 11/07/2018    PATIENT IDENTIFIER: Ms. CHANE MAGNER is a 68 y.o. female with a past medical history of HTN, T2DM, Asthma, Hx of Breast Ca (2018). The patient has followed with Endocrinology clinic since 11/07/2018 for consultative assistance with management of her diabetes.  DIABETIC HISTORY:  Ms. Russum was diagnosed with T2DM in 2013. Pt was on diet control until 08/2018 when she was started on Metformin, Rybelsus and Insulin. Her hemoglobin A1c has ranged from 6.6% in 2013, peaking at 12.5% in 2020  On her initial visit to our clinic her A1c was 8.8 % . She was on Rybelsus, tresiba and Metformin  Stopped Metformin 10/2020 due to GI side effects  Attempted to start Jardiance through patient assistance program in January 2023  that she did not fill out    Switch Rybelsus to [facility] 01/2023  SUBJECTIVE:   During the last visit (05/03/2023): A1c 10.2%     Today (09/01/2023): Ms. Verdell is here for a follow up on her diabetes management.  She checks  glucose rarely.    She follows with oncology for hx of breast ca   Denies nausea or  vomiting  Denies constipation or diarrhea   She stopped mounjaro for 2 weeks while having a colonoscopy  She assures me compliance with Mounjaro but she also states she has 12 pens     HOME DIABETES REGIMEN:  Basaglar  52 units daily  Mounjaro 7.5 mg weekly       DIABETIC COMPLICATIONS: Microvascular complications:  neuropathy Denies: CKD,  Last eye exam: Completed 2023  Macrovascular complications:   Denies: CAD, PVD, CVA      HISTORY:  Past Medical History:  Past Medical History:  Diagnosis Date   Abnormal Pap smear of cervix 2009   Allergic rhinitis, cause unspecified    Arthritis    Arthritis of  ankle BILATERAL / HX FX'S   Asthma, mild persistent    Breast cancer (HCC)    Dyspnea    with much activity   Fibroids 2008   GERD (gastroesophageal reflux disease)    not current   History of radiation therapy 01/27/17-03/16/17   left breast 1.8 Gy in 28 fractions, left breast boost 2 Gy in 6 fractions   HSV-2 (herpes simplex virus 2) infection    at age 59   Malignant neoplasm of lower-inner quadrant of left breast in female, estrogen receptor positive (HCC) 08/20/2016   Obesity, morbid (HCC)    Personal history of chemotherapy    Personal history of radiation therapy    Pneumonia    2013, 2017   Tear of medial meniscus of knee LEFT   Type II or unspecified type diabetes mellitus without mention of complication, uncontrolled    Type II- has never been on medication   Past Surgical History:  Past Surgical History:  Procedure Laterality Date   BREAST BIOPSY     BREAST BIOPSY Left 03/10/2023   MM LT BREAST BX W LOC DEV 1ST LESION IMAGE BX SPEC STEREO GUIDE 03/10/2023 [facility]   BREAST LUMPECTOMY Left 09/22/2016   BREAST LUMPECTOMY WITH RADIOACTIVE SEED AND SENTINEL LYMPH NODE BIOPSY Left 09/22/2016   Procedure: BREAST LUMPECTOMY WITH RADIOACTIVE SEED AND LEFT AXILLARY SENTINEL LYMPH NODE BIOPSY;  Surgeon: Onalee Hua  Ezzard Standing, MD;  Location: [facility] OR;  Service: General;  Laterality: Left;   CHONDROPLASTY  08/19/2011   Procedure: CHONDROPLASTY;  Surgeon: Drucilla Schmidt, MD;  Location: [facility];  Service: Orthopedics;;  shaving of medial femeral chondral   COLONOSCOPY WITH PROPOFOL N/A 08/17/2023   Procedure: COLONOSCOPY WITH PROPOFOL;  Surgeon: Shellia Cleverly, DO;  Location: [facility];  Service: Gastroenterology;  Laterality: N/A;   DILATION AND CURETTAGE OF UTERUS     HYSTEROSCOPY WITH D & C  2008   benign endometrial polyp - Dr. Delia Heady   KNEE ARTHROSCOPY W/ MENISCECTOMY Bilateral 07/07/2010   1 year apart   MENISECTOMY  08/19/2011   Procedure: MENISECTOMY;   Surgeon: Drucilla Schmidt, MD;  Location: [facility];  Service: Orthopedics;;  partial lateral menisectomy   POLYPECTOMY  08/17/2023   Procedure: POLYPECTOMY;  Surgeon: Shellia Cleverly, DO;  Location: [facility];  Service: Gastroenterology;;   [place] REMOVAL N/A 12/21/2017   Procedure: REMOVAL PORT-A-CATH;  Surgeon: Ovidio Kin, MD;  Location: [facility] OR;  Service: General;  Laterality: N/A;   PORTACATH PLACEMENT N/A 09/22/2016   Procedure: INSERTION PORT-A-CATH;  Surgeon: Ovidio Kin, MD;  Location: [facility] OR;  Service: General;  Laterality: N/A;   TONSILLECTOMY     TUBAL LIGATION  AGE 69   Social History:  reports that she quit smoking about 37 years ago. Her smoking use included cigarettes. She started smoking about 42 years ago. She has never used smokeless tobacco. She reports that she does not currently use alcohol. She reports that she does not use drugs. Family History:  Family History  Problem Relation Age of Onset   Diabetes Mother    Heart disease Father    Colon polyps Father    Cancer Brother 23       prostate   Breast cancer Maternal Grandmother 78   Colon cancer Neg Hx    Esophageal cancer Neg Hx    Stomach cancer Neg Hx      HOME MEDICATIONS: Allergies as of 09/01/2023       Reactions   Metformin And Related    GI upset    No Known Allergies         Medication List        Accurate as of September 01, 2023  3:07 PM. If you have any questions, ask your nurse or doctor.          STOP taking these medications    Na Sulfate-K Sulfate-Mg Sulfate concentrate 17.5-3.13-1.6 GM/177ML Soln Commonly known as: Suprep Bowel Prep Kit Stopped by: Johnney Ou Brexley Cutshaw   tirzepatide 7.5 MG/0.5ML Pen Commonly known as: MOUNJARO Replaced by: tirzepatide 10 MG/0.5ML Pen Stopped by: Johnney Ou Tine Mabee       TAKE these medications    anastrozole 1 MG tablet Commonly known as: ARIMIDEX Take 1 tablet (1 mg total) by mouth daily.   Basaglar  KwikPen 100 UNIT/ML Inject 60 Units into the skin daily. What changed: how much to take Changed by: Johnney Ou Ivis Henneman   BD Pen Needle Micro U/F 32G X 6 MM Misc Generic drug: Insulin Pen Needle 1 Device by Does not apply route daily in the afternoon.   blood glucose meter kit and supplies Kit Dispense based on patient and insurance preference. Use up to four times daily as directed. (FOR ICD-9 250.00, 250.01). Check blood sugar BID.   losartan 50 MG tablet Commonly known as: COZAAR TAKE 1 TABLET BY MOUTH EVERY DAY  OneTouch Verio test strip Generic drug: glucose blood Use as instructed   rosuvastatin 20 MG tablet Commonly known as: CRESTOR Take 1 tablet (20 mg total) by mouth daily.   tirzepatide 10 MG/0.5ML Pen Commonly known as: MOUNJARO Inject 10 mg into the skin once a week. Replaces: tirzepatide 7.5 MG/0.5ML Pen Started by: Johnney Ou Barry Faircloth         OBJECTIVE:   Vital Signs: BP 124/80 (BP Location: Left Arm, Patient Position: Sitting, Cuff Size: Large)   Pulse 90   Ht 5\' 4"  (1.626 m)   Wt 297 lb (134.7 kg)   LMP 06/30/2003   SpO2 95%   BMI 50.98 kg/m   Wt Readings from Last 3 Encounters:  09/01/23 297 lb (134.7 kg)  08/17/23 295 lb 6.7 oz (134 kg)  06/17/23 295 lb 9.6 oz (134.1 kg)     Exam: General: Pt appears well and is in NAD  Lungs: Clear with good BS bilat   Heart: RRR   Extremities: No pretibial edema.   Neuro: MS is good with appropriate affect, pt is alert and Ox3       DM foot exam: 05/03/2023    The skin of the feet is intact without sores or ulcerations.Plantar callous formation noted bilaterally The pedal pulses are 2+ on right and 2+ on left. The sensation is intact to a screening 5.07, 10 gram monofilament bilaterally at the heels       DATA REVIEWED:  Lab Results  Component Value Date   HGBA1C 10.6 (A) 09/01/2023   HGBA1C 10.2 (A) 05/03/2023   HGBA1C 8.9 (A) 01/28/2023    Latest Reference Range & Units 08/27/22  08:47  Sodium 135 - 145 mEq/L 139  Potassium 3.5 - 5.1 mEq/L 4.4  Chloride 96 - 112 mEq/L 102  CO2 19 - 32 mEq/L 27  Glucose 70 - 99 mg/dL 425 (H)  BUN 6 - 23 mg/dL 16  Creatinine 9.56 - 3.87 mg/dL 5.64  Calcium 8.4 - 33.2 mg/dL 9.7  GFR >95.18 mL/min 87.68  Pro B Natriuretic peptide (BNP) 0.0 - 100.0 pg/mL 3.0     In office BG 203 mg/dL    ASSESSMENT / PLAN / RECOMMENDATIONS:   1) Type 2 Diabetes Mellitus, Poorly controlled, With Neuropathic complications - Most recent A1c of 10.26 %. Goal A1c < 7.0 %.    -Patient continues with worsening glycemic control, she assures me compliance with her medications - Intolerant to  metformin due to GI issues - CGM cost prohibitive  - Jardiance cost-prohibitive , I have attempted to obtain patient assistance for her, but the patient is skeptical about side effects -She is tolerating Mounjaro, will increase as below -I will also increase insulin as below -We also entertain the idea of adding glipizide twice daily -My assistant has confirmed that the patient was dispensed 12 pens of Mounjaro in November and again in january  MEDICATIONS: -Increase Basaglar 60 Units daily  - Increase Mounjaro 10 mg weekly    EDUCATION / INSTRUCTIONS: BG monitoring instructions: Patient is instructed to check her blood sugars 2 times a day, fasting and bedtime. Call [place] [facility] if: BG persistently < 70 I reviewed the Rule of 15 for the treatment of hypoglycemia in detail with the patient. Literature supplied.   2) Dyslipidemia :  -LDL has been at goal in the past  Medication Continue rosuvastatin 20 mg daily   F/U in 3 months    Signed electronically by: Lyndle Herrlich, MD  [place]  [facility]  [facility] [address] [place], [place] [address] Phone: [phone] FAX: [phone]   CC: Etta Grandchild, MD [address] [place] [address] Phone: [phone]  Fax:  [phone]  Return to Endocrinology clinic as below: Future Appointments  Date Time Provider Department Center  09/23/2023 10:00 AM [facility] CLINICAL SUPPORT [facility] None  10/27/2023  8:30 AM [facility] DX DEXA 1 [facility]  12/01/2023  2:00 PM Crystalynn Mcinerney, Konrad Dolores, MD [facility] None  02/03/2024  8:50 AM [facility]-ANNUAL WELLNESS VISIT 2 [facility] None  03/06/2024  9:30 AM Serena Croissant, MD [facility] None

## 2023-09-01 NOTE — Telephone Encounter (Signed)
 12 pens in November and 12 pens in January

## 2023-09-01 NOTE — Telephone Encounter (Signed)
 Can you please contact the pharmacy and see how many pens have they dispensed of Mounjaro 7.5 mg dose since November?    The patient states she has 12 pens of Mounjaro at home, but she also says she has been using it weekly except she held the 2 weeks before the colonoscopy   Thanks

## 2023-09-16 ENCOUNTER — Encounter: Payer: Self-pay | Admitting: Hematology and Oncology

## 2023-09-23 ENCOUNTER — Encounter: Payer: Self-pay | Admitting: Hematology and Oncology

## 2023-09-23 ENCOUNTER — Ambulatory Visit (INDEPENDENT_AMBULATORY_CARE_PROVIDER_SITE_OTHER): Payer: Medicare Other

## 2023-09-23 DIAGNOSIS — E538 Deficiency of other specified B group vitamins: Secondary | ICD-10-CM

## 2023-09-23 MED ORDER — CYANOCOBALAMIN 1000 MCG/ML IJ SOLN
1000.0000 ug | Freq: Once | INTRAMUSCULAR | Status: AC
  Administered 2023-09-23: 1000 ug via INTRAMUSCULAR

## 2023-09-23 NOTE — Progress Notes (Signed)
 Pt here for monthly B12 injection per Sanda Linger, MD  B12 given IM and pt tolerated injection well.  Next B12 injection scheduled for 04/30

## 2023-10-18 ENCOUNTER — Encounter: Payer: Self-pay | Admitting: Hematology and Oncology

## 2023-10-25 ENCOUNTER — Ambulatory Visit (INDEPENDENT_AMBULATORY_CARE_PROVIDER_SITE_OTHER)

## 2023-10-25 DIAGNOSIS — E538 Deficiency of other specified B group vitamins: Secondary | ICD-10-CM

## 2023-10-25 MED ORDER — CYANOCOBALAMIN 1000 MCG/ML IJ SOLN
1000.0000 ug | Freq: Once | INTRAMUSCULAR | Status: AC
  Administered 2023-10-25: 1000 ug via INTRAMUSCULAR

## 2023-10-25 NOTE — Progress Notes (Signed)
Pt here for monthly B12 injection per Dr. Yetta Barre  B12 given IM. and pt tolerated injection well.

## 2023-10-27 ENCOUNTER — Ambulatory Visit

## 2023-10-27 ENCOUNTER — Ambulatory Visit
Admission: RE | Admit: 2023-10-27 | Discharge: 2023-10-27 | Disposition: A | Discharge status: Home / Self Care | Payer: Medicare Other | Source: Ambulatory Visit | Attending: Hematology and Oncology

## 2023-10-27 DIAGNOSIS — C50312 Malignant neoplasm of lower-inner quadrant of left female breast: Secondary | ICD-10-CM

## 2023-10-27 DIAGNOSIS — Z780 Asymptomatic menopausal state: Secondary | ICD-10-CM

## 2023-11-24 ENCOUNTER — Ambulatory Visit (INDEPENDENT_AMBULATORY_CARE_PROVIDER_SITE_OTHER)

## 2023-11-24 DIAGNOSIS — E538 Deficiency of other specified B group vitamins: Secondary | ICD-10-CM | POA: Diagnosis not present

## 2023-11-24 MED ORDER — CYANOCOBALAMIN 1000 MCG/ML IJ SOLN
1000.0000 ug | Freq: Once | INTRAMUSCULAR | Status: AC
  Administered 2023-11-24: 1000 ug via INTRAMUSCULAR

## 2023-11-24 NOTE — Progress Notes (Signed)
 After obtaining consent, and per orders of Dr. Yetta Barre, injection of B12 given by Ferdie Ping. Patient instructed to report any adverse reaction to me immediately.

## 2023-12-01 ENCOUNTER — Encounter: Payer: Self-pay | Admitting: Internal Medicine

## 2023-12-01 ENCOUNTER — Ambulatory Visit (INDEPENDENT_AMBULATORY_CARE_PROVIDER_SITE_OTHER): Admitting: Internal Medicine

## 2023-12-01 ENCOUNTER — Encounter: Payer: Self-pay | Admitting: Hematology and Oncology

## 2023-12-01 VITALS — Ht 64.0 in | Wt 295.0 lb

## 2023-12-01 DIAGNOSIS — E1142 Type 2 diabetes mellitus with diabetic polyneuropathy: Secondary | ICD-10-CM

## 2023-12-01 DIAGNOSIS — Z794 Long term (current) use of insulin: Secondary | ICD-10-CM

## 2023-12-01 DIAGNOSIS — E785 Hyperlipidemia, unspecified: Secondary | ICD-10-CM

## 2023-12-01 DIAGNOSIS — E1165 Type 2 diabetes mellitus with hyperglycemia: Secondary | ICD-10-CM | POA: Diagnosis not present

## 2023-12-01 LAB — POCT GLUCOSE (DEVICE FOR HOME USE): POC Glucose: 253 mg/dL — AB (ref 70–99)

## 2023-12-01 LAB — POCT GLYCOSYLATED HEMOGLOBIN (HGB A1C): Hemoglobin A1C: 11.1 % — AB (ref 4.0–5.6)

## 2023-12-01 MED ORDER — GLIPIZIDE 5 MG PO TABS: 5.0000 mg | ORAL_TABLET | Freq: Two times a day (BID) | ORAL | 3 refills | Status: DC

## 2023-12-01 MED ORDER — TIRZEPATIDE 12.5 MG/0.5ML ~~LOC~~ SOAJ: 12.5000 mg | SUBCUTANEOUS | 3 refills | Status: AC

## 2023-12-01 MED ORDER — FREESTYLE LIBRE 3 PLUS SENSOR MISC
1.0000 | 3 refills | Status: DC
Start: 2023-12-01 — End: 2024-03-07

## 2023-12-01 MED ORDER — BASAGLAR KWIKPEN 100 UNIT/ML ~~LOC~~ SOPN: 66.0000 [IU] | PEN_INJECTOR | Freq: Every day | SUBCUTANEOUS | 4 refills | Status: DC

## 2023-12-01 NOTE — Progress Notes (Signed)
 Name: Sandra Wood  Age/ Sex: 68 y.o., female   MRN/ DOB: [id number], Feb 16, 1956     PCP: Arcadio Knuckles, MD   Reason for Endocrinology Evaluation: Type 2 Diabetes Mellitus  Initial Endocrine Consultative Visit: 11/07/2018    PATIENT IDENTIFIER: Sandra Wood is a 68 y.o. female with a past medical history of HTN, T2DM, Asthma, Hx of Breast Ca (2018). The patient has followed with Endocrinology clinic since 11/07/2018 for consultative assistance with management of her diabetes.  DIABETIC HISTORY:  Sandra Wood was diagnosed with T2DM in 2013. Pt was on diet control until 08/2018 when she was started on Metformin , Rybelsus  and Insulin . Her hemoglobin A1c has ranged from 6.6% in 2013, peaking at 12.5% in 2020  On her initial visit to our clinic her A1c was 8.8 % . She was on Rybelsus , tresiba  and Metformin   Stopped Metformin  10/2020 due to GI side effects  Attempted to start Jardiance  through patient assistance program in January 2023  that she did not fill out    Switch Rybelsus  to Mounjaro  01/2023  SUBJECTIVE:   During the last visit (09/01/2023): A1c 10.2%     Today (12/01/2023): Sandra Wood is here for a follow up on her diabetes management.  She checks  glucose rarely.    She follows with oncology for hx of breast ca  She is complaining of fatigue and knee pains Denies nausea or  vomiting  Denies constipation or diarrhea     HOME DIABETES REGIMEN:  Basaglar   60 units daily  Mounjaro  10 mg weekly      DIABETIC COMPLICATIONS: Microvascular complications:  neuropathy Denies: CKD,  Last eye exam: Completed 2023  Macrovascular complications:   Denies: CAD, PVD, CVA      HISTORY:  Past Medical History:  Past Medical History:  Diagnosis Date   Abnormal Pap smear of cervix 2009   Allergic rhinitis, cause unspecified    Arthritis    Arthritis of ankle BILATERAL / HX FX'S   Asthma, mild persistent    Breast cancer (HCC)    Dyspnea    with much  activity   Fibroids 2008   GERD (gastroesophageal reflux disease)    not current   History of radiation therapy 01/27/17-03/16/17   left breast 1.8 Gy in 28 fractions, left breast boost 2 Gy in 6 fractions   HSV-2 (herpes simplex virus 2) infection    at age 81   Malignant neoplasm of lower-inner quadrant of left breast in female, estrogen receptor positive (HCC) 08/20/2016   Obesity, morbid (HCC)    Personal history of chemotherapy    Personal history of radiation therapy    Pneumonia    2013, 2017   Tear of medial meniscus of knee LEFT   Type II or unspecified type diabetes mellitus without mention of complication, uncontrolled    Type II- has never been on medication   Past Surgical History:  Past Surgical History:  Procedure Laterality Date   BREAST BIOPSY     BREAST BIOPSY Left 03/10/2023   MM LT BREAST BX W LOC DEV 1ST LESION IMAGE BX SPEC STEREO GUIDE 03/10/2023 [facility]   BREAST LUMPECTOMY Left 09/22/2016   BREAST LUMPECTOMY WITH RADIOACTIVE SEED AND SENTINEL LYMPH NODE BIOPSY Left 09/22/2016   Procedure: BREAST LUMPECTOMY WITH RADIOACTIVE SEED AND LEFT AXILLARY SENTINEL LYMPH NODE BIOPSY;  Surgeon: Juanita Norlander, MD;  Location: [facility] OR;  Service: General;  Laterality: Left;   CHONDROPLASTY  08/19/2011   Procedure: CHONDROPLASTY;  Surgeon: Verlinda Gloss, MD;  Location: [facility];  Service: Orthopedics;;  shaving of medial femeral chondral   COLONOSCOPY WITH PROPOFOL  N/A 08/17/2023   Procedure: COLONOSCOPY WITH PROPOFOL ;  Surgeon: Annis Kinder, DO;  Location: [facility];  Service: Gastroenterology;  Laterality: N/A;   DILATION AND CURETTAGE OF UTERUS     HYSTEROSCOPY WITH D & C  2008   benign endometrial polyp - Dr. Leonetta Rama   KNEE ARTHROSCOPY W/ MENISCECTOMY Bilateral 07/07/2010   1 year apart   MENISECTOMY  08/19/2011   Procedure: MENISECTOMY;  Surgeon: Verlinda Gloss, MD;  Location: [facility];  Service: Orthopedics;;   partial lateral menisectomy   POLYPECTOMY  08/17/2023   Procedure: POLYPECTOMY;  Surgeon: Annis Kinder, DO;  Location: [facility];  Service: Gastroenterology;;   Sammy Crisp REMOVAL N/A 12/21/2017   Procedure: REMOVAL PORT-A-CATH;  Surgeon: Juanita Norlander, MD;  Location: [facility] OR;  Service: General;  Laterality: N/A;   PORTACATH PLACEMENT N/A 09/22/2016   Procedure: INSERTION PORT-A-CATH;  Surgeon: Juanita Norlander, MD;  Location: [facility] OR;  Service: General;  Laterality: N/A;   TONSILLECTOMY     TUBAL LIGATION  AGE 56   Social History:  reports that she quit smoking about 37 years ago. Her smoking use included cigarettes. She started smoking about 42 years ago. She has never used smokeless tobacco. She reports that she does not currently use alcohol. She reports that she does not use drugs. Family History:  Family History  Problem Relation Age of Onset   Diabetes Mother    Heart disease Father    Colon polyps Father    Cancer Brother 74       prostate   Breast cancer Maternal Grandmother 86   Colon cancer Neg Hx    Esophageal cancer Neg Hx    Stomach cancer Neg Hx      HOME MEDICATIONS: Allergies as of 12/01/2023       Reactions   Metformin  And Related    GI upset    No Known Allergies         Medication List        Accurate as of December 01, 2023  2:07 PM. If you have any questions, ask your nurse or doctor.          anastrozole  1 MG tablet Commonly known as: ARIMIDEX  Take 1 tablet (1 mg total) by mouth daily.   Basaglar  KwikPen 100 UNIT/ML Inject 60 Units into the skin daily.   BD Pen Needle Micro U/F 32G X 6 MM Misc Generic drug: Insulin  Pen Needle 1 Device by Does not apply route daily in the afternoon.   blood glucose meter kit and supplies Kit Dispense based on patient and insurance preference. Use up to four times daily as directed. (FOR ICD-9 250.00, 250.01). Check blood sugar BID.   losartan  50 MG tablet Commonly known as: COZAAR  TAKE 1 TABLET BY MOUTH  EVERY DAY   OneTouch Verio test strip Generic drug: glucose blood Use as instructed   rosuvastatin  20 MG tablet Commonly known as: CRESTOR  Take 1 tablet (20 mg total) by mouth daily.   tirzepatide  10 MG/0.5ML Pen Commonly known as: MOUNJARO  Inject 10 mg into the skin once a week.         OBJECTIVE:   Vital Signs: Ht 5\' 4"  (1.626 m)   Wt 295 lb (133.8 kg)   LMP 06/30/2003   BMI 50.64 kg/m   Wt Readings from Last 3 Encounters:  12/01/23 295 lb (133.8 kg)  09/01/23 297 lb (134.7 kg)  08/17/23 295 lb 6.7 oz (134 kg)     Exam: General: Pt appears well and is in NAD  Lungs: Clear with good BS bilat   Heart: RRR   Extremities: No pretibial edema.   Neuro: MS is good with appropriate affect, pt is alert and Ox3       DM foot exam: 05/03/2023    The skin of the feet is intact without sores or ulcerations.Plantar callous formation noted bilaterally The pedal pulses are 2+ on right and 2+ on left. The sensation is intact to a screening 5.07, 10 gram monofilament bilaterally at the heels       DATA REVIEWED:  Lab Results  Component Value Date   HGBA1C 10.6 (A) 09/01/2023   HGBA1C 10.2 (A) 05/03/2023   HGBA1C 8.9 (A) 01/28/2023    Latest Reference Range & Units 08/27/22 08:47  Sodium 135 - 145 mEq/L 139  Potassium 3.5 - 5.1 mEq/L 4.4  Chloride 96 - 112 mEq/L 102  CO2 19 - 32 mEq/L 27  Glucose 70 - 99 mg/dL 324 (H)  BUN 6 - 23 mg/dL 16  Creatinine 4.01 - 0.27 mg/dL 2.53  Calcium  8.4 - 10.5 mg/dL 9.7  GFR >66.44 mL/min 87.68  Pro B Natriuretic peptide (BNP) 0.0 - 100.0 pg/mL 3.0     In office BG 253 mg/dL    ASSESSMENT / PLAN / RECOMMENDATIONS:   1) Type 2 Diabetes Mellitus, Poorly controlled, With Neuropathic complications - Most recent A1c of 11.1 %. Goal A1c < 7.0 %.    -Her A1c continues to trend upwards despite increasing her Basaglar  and Mounjaro  - Intolerant to  metformin  due to GI issues - CGM cost prohibitive in the past, a prescription for  freestyle libre 3+ will be faxed to her DME supplier - Jardiance  cost-prohibitive , I have attempted to obtain patient assistance for her, but the patient is skeptical about side effects - I have recommended starting glipizide as below, discussed the importance of taking it 15-20 minutes before the first and last meal of the day, to prevent hypoglycemia. -She is past due for labs, but she is unable to stay for labs at this time, patient will schedule an appointment for return, will check ACTH, cortisol, and IGF-I  MEDICATIONS: -Start glipizide 5 mg, 1 tablet before breakfast and 1 tablet before supper -Increase Basaglar  66 Units daily  -Increase Mounjaro  12.5 mg weekly    EDUCATION / INSTRUCTIONS: BG monitoring instructions: Patient is instructed to check her blood sugars 2 times a day, fasting and bedtime. Call [facility] if: BG persistently < 70 I reviewed the Rule of 15 for the treatment of hypoglycemia in detail with the patient. Literature supplied.   2) Dyslipidemia :  -LDL has been at goal in the past - Patient will return to the office for repeat lipid panel  Medication Continue rosuvastatin  20 mg daily   F/U in 3 months    Signed electronically by: Natale Bail, MD  [facility]  [facility] [address] [place] [place], [place] [address] Phone: [phone] FAX: [phone]   CC: Arcadio Knuckles, MD [address] [place] [address] Phone: [phone]  Fax: [phone]  Return to Endocrinology clinic as below: Future Appointments  Date Time Provider Department Center  12/27/2023 10:40 AM [facility] CLINICAL SUPPORT [facility] None  02/03/2024  8:50 AM [facility]-ANNUAL WELLNESS VISIT 2 [facility] None  03/06/2024  9:30 AM Cameron Cea, MD [facility] None

## 2023-12-01 NOTE — Patient Instructions (Addendum)
-   Increase  Basaglar  66 Units daily  - Increase mounjaro  12.5 mg once weekly  - Start Glipizide 5 mg , 1 tablet before Breakfast and 1 tablet before Supper     HOW TO TREAT LOW BLOOD SUGARS (Blood sugar LESS THAN 70 MG/DL) Please follow the RULE OF 15 for the treatment of hypoglycemia treatment (when your (blood sugars are less than 70 mg/dL)   STEP 1: Take 15 grams of carbohydrates when your blood sugar is low, which includes:  3-4 GLUCOSE TABS  OR 3-4 OZ OF JUICE OR REGULAR SODA OR ONE TUBE OF GLUCOSE GEL    STEP 2: RECHECK blood sugar in 15 MINUTES STEP 3: If your blood sugar is still low at the 15 minute recheck --> then, go back to STEP 1 and treat AGAIN with another 15 grams of carbohydrates.

## 2023-12-27 ENCOUNTER — Ambulatory Visit (INDEPENDENT_AMBULATORY_CARE_PROVIDER_SITE_OTHER)

## 2023-12-27 DIAGNOSIS — E538 Deficiency of other specified B group vitamins: Secondary | ICD-10-CM

## 2023-12-27 MED ORDER — CYANOCOBALAMIN 1000 MCG/ML IJ SOLN
1000.0000 ug | Freq: Once | INTRAMUSCULAR | Status: AC
  Administered 2023-12-27: 1000 ug via INTRAMUSCULAR

## 2023-12-27 NOTE — Progress Notes (Signed)
 After obtaining consent, and per orders of Dr. Joshua, injection of B12 given by Ronnald SHAUNNA Palms. Patient instructed to to report any adverse reaction to me immediately.

## 2024-01-25 ENCOUNTER — Other Ambulatory Visit: Payer: Self-pay | Admitting: Hematology and Oncology

## 2024-01-25 DIAGNOSIS — Z853 Personal history of malignant neoplasm of breast: Secondary | ICD-10-CM

## 2024-01-26 ENCOUNTER — Ambulatory Visit (INDEPENDENT_AMBULATORY_CARE_PROVIDER_SITE_OTHER)

## 2024-01-26 DIAGNOSIS — E538 Deficiency of other specified B group vitamins: Secondary | ICD-10-CM

## 2024-01-26 MED ORDER — CYANOCOBALAMIN 1000 MCG/ML IJ SOLN
1000.0000 ug | Freq: Once | INTRAMUSCULAR | Status: AC
  Administered 2024-01-26: 1000 ug via INTRAMUSCULAR

## 2024-01-26 NOTE — Progress Notes (Signed)
 Pt here for monthly B12 injection per Dr.Jones  B12 1000mcg given IM and pt tolerated injection well.  Next B12 injection scheduled for next month

## 2024-02-03 ENCOUNTER — Ambulatory Visit (INDEPENDENT_AMBULATORY_CARE_PROVIDER_SITE_OTHER): Payer: Medicare Other

## 2024-02-03 VITALS — Ht 64.0 in | Wt 295.0 lb

## 2024-02-03 DIAGNOSIS — Z0000 Encounter for general adult medical examination without abnormal findings: Secondary | ICD-10-CM | POA: Diagnosis not present

## 2024-02-03 NOTE — Patient Instructions (Addendum)
 Sandra Wood , Thank you for taking time out of your busy schedule to complete your Annual Wellness Visit with me. I enjoyed our conversation and look forward to speaking with you again next year. I, as well as your care team,  appreciate your ongoing commitment to your health goals. Please review the following plan we discussed and let me know if I can assist you in the future. Your Game plan/ To Do List    Referrals: If you haven't heard from the office you've been referred to, please reach out to them at the phone provided.   Follow up Visits: We will see or speak with you next year for your Next Medicare AWV with our clinical staff Have you seen your provider in the last 6 months (3 months if uncontrolled diabetes)? No  Clinician Recommendations:  Aim for 30 minutes of exercise or brisk walking, 6-8 glasses of water, and 5 servings of fruits and vegetables each day. Educated and advised on getting the Tdap vaccine in 2025.      This is a list of the screenings recommended for you:  Health Maintenance  Topic Date Due   Yearly kidney health urinalysis for diabetes  Never done   COVID-19 Vaccine (3 - Moderna risk series) 12/01/2019   DTaP/Tdap/Td vaccine (2 - Td or Tdap) 03/31/2022   Yearly kidney function blood test for diabetes  08/27/2023   Flu Shot  01/28/2024   Complete foot exam   05/02/2024   Hemoglobin A1C  06/01/2024   Eye exam for diabetics  07/19/2024   Pneumococcal Vaccine for age over 83 (3 of 3 - PCV20 or PCV21) 11/19/2024   Medicare Annual Wellness Visit  02/02/2025   Mammogram  03/02/2025   Colon Cancer Screening  08/16/2033   DEXA scan (bone density measurement)  Completed   Hepatitis C Screening  Completed   Zoster (Shingles) Vaccine  Completed   Hepatitis B Vaccine  Aged Out   HPV Vaccine  Aged Out   Meningitis B Vaccine  Aged Out    Advanced directives: (Declined) Advance directive discussed with you today. Even though you declined this today, please call our  office should you change your mind, and we can give you the proper paperwork for you to fill out. Advance Care Planning is important because it:  [x]  Makes sure you receive the medical care that is consistent with your values, goals, and preferences  [x]  It provides guidance to your family and loved ones and reduces their decisional burden about whether or not they are making the right decisions based on your wishes.  Follow the link provided in your after visit summary or read over the paperwork we have mailed to you to help you started getting your Advance Directives in place. If you need assistance in completing these, please reach out to us  so that we can help you!

## 2024-02-03 NOTE — Progress Notes (Signed)
 Subjective:   Sandra Wood is a 68 y.o. who presents for a Medicare Wellness preventive visit.  As a reminder, Annual Wellness Visits don't include a physical exam, and some assessments may be limited, especially if this visit is performed virtually. We may recommend an in-person follow-up visit with your provider if needed.  Visit Complete: Virtual I connected with  Sandra Wood on 02/03/24 by a audio enabled telemedicine application and verified that I am speaking with the correct person using two identifiers.  Patient Location: Home  Provider Location: Office/Clinic  I discussed the limitations of evaluation and management by telemedicine. The patient expressed understanding and agreed to proceed.  Vital Signs: Because this visit was a virtual/telehealth visit, some criteria may be missing or patient reported. Any vitals not documented were not able to be obtained and vitals that have been documented are patient reported.  VideoDeclined- This patient declined Librarian, academic. Therefore the visit was completed with audio only.  Persons Participating in Visit: Patient.  AWV Questionnaire: No: Patient Medicare AWV questionnaire was not completed prior to this visit.  Cardiac Risk Factors include: advanced age (>72men, >20 women);diabetes mellitus;dyslipidemia;hypertension;obesity (BMI >30kg/m2)     Objective:    Today's Vitals   02/03/24 0852  Weight: 295 lb (133.8 kg)  Height: 5' 4 (1.626 m)   Body mass index is 50.64 kg/m.     02/03/2024    8:52 AM 08/17/2023    9:43 AM 03/04/2023    9:28 AM 01/28/2023    2:37 PM 03/03/2022    9:49 AM 10/15/2021   10:04 AM 03/04/2021    9:11 AM  Advanced Directives  Does Patient Have a Medical Advance Directive? No No No Yes No No No  Type of Theme park manager;Living will     Copy of Healthcare Power of Attorney in Chart?    No - copy requested     Would patient like  information on creating a medical advance directive? No - Patient declined No - Patient declined No - Patient declined  No - Patient declined No - Patient declined No - Patient declined    Current Medications (verified) Outpatient Encounter Medications as of 02/03/2024  Medication Sig   anastrozole  (ARIMIDEX ) 1 MG tablet Take 1 tablet (1 mg total) by mouth daily.   blood glucose meter kit and supplies KIT Dispense based on patient and insurance preference. Use up to four times daily as directed. (FOR ICD-9 250.00, 250.01). Check blood sugar BID.   Continuous Glucose Sensor (FREESTYLE LIBRE 3 PLUS SENSOR) MISC 1 Device by Other route every 14 (fourteen) days. Change sensor every 15 days.   glipiZIDE  (GLUCOTROL ) 5 MG tablet Take 1 tablet (5 mg total) by mouth 2 (two) times daily before a meal.   glucose blood (ONETOUCH VERIO) test strip Use as instructed   Insulin  Glargine (BASAGLAR  KWIKPEN) 100 UNIT/ML Inject 66 Units into the skin daily.   Insulin  Pen Needle (BD PEN NEEDLE MICRO U/F) 32G X 6 MM MISC 1 Device by Does not apply route daily in the afternoon.   losartan  (COZAAR ) 50 MG tablet TAKE 1 TABLET BY MOUTH EVERY DAY   rosuvastatin  (CRESTOR ) 20 MG tablet Take 1 tablet (20 mg total) by mouth daily.   tirzepatide  (MOUNJARO ) 12.5 MG/0.5ML Pen Inject 12.5 mg into the skin once a week.   [DISCONTINUED] prochlorperazine  (COMPAZINE ) 10 MG tablet Take 1 tablet (10 mg total) by mouth every 6 (six)  hours as needed (Nausea or vomiting). (Patient not taking: Reported on 01/11/2017)   No facility-administered encounter medications on file as of 02/03/2024.    Allergies (verified) Metformin  and related and No known allergies   History: Past Medical History:  Diagnosis Date   Abnormal Pap smear of cervix 2009   Allergic rhinitis, cause unspecified    Arthritis    Arthritis of ankle BILATERAL / HX FX'S   Asthma, mild persistent    Breast cancer (HCC)    Dyspnea    with much activity   Fibroids 2008    GERD (gastroesophageal reflux disease)    not current   History of radiation therapy 01/27/17-03/16/17   left breast 1.8 Gy in 28 fractions, left breast boost 2 Gy in 6 fractions   HSV-2 (herpes simplex virus 2) infection    at age 65   Malignant neoplasm of lower-inner quadrant of left breast in female, estrogen receptor positive (HCC) 08/20/2016   Obesity, morbid (HCC)    Personal history of chemotherapy    Personal history of radiation therapy    Pneumonia    2013, 2017   Tear of medial meniscus of knee LEFT   Type II or unspecified type diabetes mellitus without mention of complication, uncontrolled    Type II- has never been on medication   Past Surgical History:  Procedure Laterality Date   BREAST BIOPSY     BREAST BIOPSY Left 03/10/2023   MM LT BREAST BX W LOC DEV 1ST LESION IMAGE BX SPEC STEREO GUIDE 03/10/2023 [facility]   BREAST LUMPECTOMY Left 09/22/2016   BREAST LUMPECTOMY WITH RADIOACTIVE SEED AND SENTINEL LYMPH NODE BIOPSY Left 09/22/2016   Procedure: BREAST LUMPECTOMY WITH RADIOACTIVE SEED AND LEFT AXILLARY SENTINEL LYMPH NODE BIOPSY;  Surgeon: Alm Angle, MD;  Location: [facility] OR;  Service: General;  Laterality: Left;   CHONDROPLASTY  08/19/2011   Procedure: CHONDROPLASTY;  Surgeon: Lynwood SHAUNNA Bern, MD;  Location:  [facility];  Service: Orthopedics;;  shaving of medial femeral chondral   COLONOSCOPY WITH PROPOFOL  N/A 08/17/2023   Procedure: COLONOSCOPY WITH PROPOFOL ;  Surgeon: San Sandor GAILS, DO;  Location: [facility];  Service: Gastroenterology;  Laterality: N/A;   DILATION AND CURETTAGE OF UTERUS     HYSTEROSCOPY WITH D & C  2008   benign endometrial polyp - Dr. Toney Moats   KNEE ARTHROSCOPY W/ MENISCECTOMY Bilateral 07/07/2010   1 year apart   MENISECTOMY  08/19/2011   Procedure: MENISECTOMY;  Surgeon: Lynwood SHAUNNA Bern, MD;  Location: [facility];  Service: Orthopedics;;  partial lateral menisectomy   POLYPECTOMY  08/17/2023    Procedure: POLYPECTOMY;  Surgeon: San Sandor GAILS, DO;  Location: [facility];  Service: Gastroenterology;;   [facility] REMOVAL N/A 12/21/2017   Procedure: REMOVAL PORT-A-CATH;  Surgeon: Angle Alm, MD;  Location[facility] OR;  Service: General;  Laterality: N/A;   PORTACATH PLACEMENT N/A 09/22/2016   Procedure: INSERTION PORT-A-CATH;  Surgeon: Alm Angle, MD;  Location: [facility] OR;  Service: General;  Laterality: N/A;   TONSILLECTOMY     TUBAL LIGATION  AGE 64   Family History  Problem Relation Age of Onset   Diabetes Mother    Heart disease Father    Colon polyps Father    Cancer Brother 22       prostate   Breast cancer Maternal Grandmother 35   Colon cancer Neg Hx    Esophageal cancer Neg Hx    Stomach cancer Neg Hx    Social  History   Socioeconomic History   Marital status: Divorced    Spouse name: Not on file   Number of children: 2   Years of education: Not on file   Highest education level: Not on file  Occupational History   Occupation: retired  Tobacco Use   Smoking status: Former    Current packs/day: 0.00    Average packs/day: 1 pack/day for 5.0 years (5.0 ttl pk-yrs)    Types: Cigarettes    Start date: 08/13/1981    Quit date: 08/13/1986    Years since quitting: 37.5   Smokeless tobacco: Never  Vaping Use   Vaping status: Never Used  Substance and Sexual Activity   Alcohol use: Not Currently   Drug use: No   Sexual activity: Not Currently    Birth control/protection: Post-menopausal, Abstinence  Other Topics Concern   Not on file  Social History Narrative   Not on file   Social Drivers of Health   Financial Resource Strain: Low Risk  (02/03/2024)   Overall Financial Resource Strain (CARDIA)    Difficulty of Paying Living Expenses: Not hard at all  Food Insecurity: No Food Insecurity (02/03/2024)   Hunger Vital Sign    Worried About Running Out of Food in the Last Year: Never true    Ran Out of Food in the Last Year: Never true  Transportation Needs: No  Transportation Needs (02/03/2024)   PRAPARE - Administrator, Civil Service (Medical): No    Lack of Transportation (Non-Medical): No  Physical Activity: Inactive (02/03/2024)   Exercise Vital Sign    Days of Exercise per Week: 0 days    Minutes of Exercise per Session: 0 min  Stress: No Stress Concern Present (02/03/2024)   [employer] of Occupational Health - Occupational Stress Questionnaire    Feeling of Stress: Not at all  Social Connections: Socially Isolated (02/03/2024)   Social Connection and Isolation Panel    Frequency of Communication with Friends and Family: More than three times a week    Frequency of Social Gatherings with Friends and Family: More than three times a week    Attends Religious Services: Never    Database administrator or Organizations: No    Attends Engineer, structural: Never    Marital Status: Divorced    Tobacco Counseling Counseling given: No    Clinical Intake:  Pre-visit preparation completed: Yes  Pain : No/denies pain     BMI - recorded: 50.64 Nutritional Status: BMI > 30  Obese Nutritional Risks: None Diabetes: Yes CBG done?: No Did pt. bring in CBG monitor from home?: No  Lab Results  Component Value Date   HGBA1C 11.1 (A) 12/01/2023   HGBA1C 10.6 (A) 09/01/2023   HGBA1C 10.2 (A) 05/03/2023     How often do you need to have someone help you when you read instructions, pamphlets, or other written materials from your doctor or pharmacy?: 1 - Never  Interpreter Needed?: No  Information entered by :: Verdie Saba, CMA   Activities of Daily Living     02/03/2024    8:56 AM  In your present state of health, do you have any difficulty performing the following activities:  Hearing? 0  Vision? 0  Difficulty concentrating or making decisions? 0  Walking or climbing stairs? 0  Dressing or bathing? 0  Doing errands, shopping? 0  Preparing Food and eating ? N  Using the Toilet? N  In the past six months,  have you accidently leaked urine? Y  Comment wears a pantyliner  Do you have problems with loss of bowel control? N  Managing your Medications? N  Managing your Finances? N  Housekeeping or managing your Housekeeping? N    Patient Care Team: Joshua Debby CROME, MD as PCP - General (Internal Medicine) Ethyl Lenis, MD as Consulting Physician (General Surgery) Odean Potts, MD as Consulting Physician (Hematology and Oncology) Izell Domino, MD as Attending Physician (Radiation Oncology) Cathlyn JAYSON Nikki Bobie FORBES, MD as Consulting Physician (Obstetrics and Gynecology) Paul Barrio, OD as Referring Physician (Optometry)  I have updated your Care Teams any recent Medical Services you may have received from other providers in the past year.     Assessment:   This is a routine wellness examination for Sandra Wood.  Hearing/Vision screen Hearing Screening - Comments:: Denies hearing difficulties   Vision Screening - Comments:: Wears eyeglasses for reading - up to date with routine eye exams with Dr Paul   Goals Addressed               This Visit's Progress     Patient Stated (pt-stated)        Patient stated she plans to do more walking       Depression Screen     02/03/2024    8:57 AM 01/28/2023   11:00 AM 08/27/2022    8:16 AM 12/24/2021    8:53 AM 10/15/2021   10:03 AM 11/20/2019   10:47 AM 11/15/2018    3:34 PM  PHQ 2/9 Scores  PHQ - 2 Score 0 0 0 1 0 0 0  PHQ- 9 Score 0 2  3  1      Fall Risk     02/03/2024    8:56 AM 01/28/2023    2:38 PM 08/27/2022    8:16 AM 12/24/2021    8:54 AM 10/15/2021   10:05 AM  Fall Risk   Falls in the past year? 1 0 0 0 0  Number falls in past yr: 0 0 0 0 0  Comment 1      Injury with Fall? 0 0 0 0 0  Risk for fall due to : Impaired balance/gait No Fall Risks No Fall Risks No Fall Risks No Fall Risks  Follow up Falls evaluation completed;Falls prevention discussed Falls prevention discussed Falls evaluation completed Falls evaluation  completed  Falls evaluation completed      Data saved with a previous flowsheet row definition    MEDICARE RISK AT HOME:  Medicare Risk at Home Any stairs in or around the home?: No If so, are there any without handrails?: No Home free of loose throw rugs in walkways, pet beds, electrical cords, etc?: Yes Adequate lighting in your home to reduce risk of falls?: Yes Life alert?: No Use of a cane, walker or w/c?: Yes (cane) Grab bars in the bathroom?: No Shower chair or bench in shower?: No Elevated toilet seat or a handicapped toilet?: No  TIMED UP AND GO:  Was the test performed?  No  Cognitive Function: 6CIT completed        02/03/2024    8:56 AM 01/28/2023    2:39 PM 10/15/2021   10:07 AM  6CIT Screen  What Year? 0 points  0 points  What month? 0 points  0 points  What time? 0 points 0 points 0 points  Count back from 20 0 points 2 points 0 points  Months in reverse 0 points 0 points 0 points  Repeat phrase 0 points 0 points 0 points  Total Score 0 points  0 points    Immunizations Immunization History  Administered Date(s) Administered   Fluad Quad(high Dose 65+) 04/24/2020, 04/03/2021, 04/15/2022   Fluad Trivalent(High Dose 65+) 03/22/2023   Influenza Split 03/31/2012   Influenza,inj,Quad PF,6+ Mos 03/29/2017, 09/13/2018, 04/03/2019   Moderna Sars-Covid-2 Vaccination 10/06/2019, 11/03/2019   Pneumococcal Conjugate-13 11/20/2019   Pneumococcal Polysaccharide-23 12/30/2011, 09/13/2018   Tdap 03/31/2012   Zoster Recombinant(Shingrix ) 01/25/2019, 05/22/2019   Zoster, Live 06/02/2012    Screening Tests Health Maintenance  Topic Date Due   Diabetic kidney evaluation - Urine ACR  Never done   COVID-19 Vaccine (3 - Moderna risk series) 12/01/2019   DTaP/Tdap/Td (2 - Td or Tdap) 03/31/2022   Diabetic kidney evaluation - eGFR measurement  08/27/2023   INFLUENZA VACCINE  01/28/2024   FOOT EXAM  05/02/2024   HEMOGLOBIN A1C  06/01/2024   OPHTHALMOLOGY EXAM   07/19/2024   Pneumococcal Vaccine: 50+ Years (3 of 3 - PCV20 or PCV21) 11/19/2024   Medicare Annual Wellness (AWV)  02/02/2025   MAMMOGRAM  03/02/2025   Colonoscopy  08/16/2033   DEXA SCAN  Completed   Hepatitis C Screening  Completed   Zoster Vaccines- Shingrix   Completed   Hepatitis B Vaccines  Aged Out   HPV VACCINES  Aged Out   Meningococcal B Vaccine  Aged Out    Health Maintenance  Health Maintenance Due  Topic Date Due   Diabetic kidney evaluation - Urine ACR  Never done   COVID-19 Vaccine (3 - Moderna risk series) 12/01/2019   DTaP/Tdap/Td (2 - Td or Tdap) 03/31/2022   Diabetic kidney evaluation - eGFR measurement  08/27/2023   INFLUENZA VACCINE  01/28/2024   Health Maintenance Items Addressed:  Diabetic labs are drawn by Dr Sam.  Additional Screening:  Vision Screening: Recommended annual ophthalmology exams for early detection of glaucoma and other disorders of the eye. Would you like a referral to an eye doctor? No    Dental Screening: Recommended annual dental exams for proper oral hygiene  Community Resource Referral / Chronic Care Management: CRR required this visit?  No   CCM required this visit?  No   Plan:    I have personally reviewed and noted the following in the patient's chart:   Medical and social history Use of alcohol, tobacco or illicit drugs  Current medications and supplements including opioid prescriptions. Patient is not currently taking opioid prescriptions. Functional ability and status Nutritional status Physical activity Advanced directives List of other physicians Hospitalizations, surgeries, and ER visits in previous 12 months Vitals Screenings to include cognitive, depression, and falls Referrals and appointments  In addition, I have reviewed and discussed with patient certain preventive protocols, quality metrics, and best practice recommendations. A written personalized care plan for preventive services as well as  general preventive health recommendations were provided to patient.   Verdie CHRISTELLA Saba, CMA   02/03/2024   After Visit Summary: (MyChart) Due to this being a telephonic visit, the after visit summary with patients personalized plan was offered to patient via MyChart   Notes: Scheduled a 1-yr HTN/Diabetes f/u w/PCP for 02/18/2024.

## 2024-02-29 ENCOUNTER — Other Ambulatory Visit

## 2024-03-01 ENCOUNTER — Ambulatory Visit: Payer: Self-pay | Admitting: Internal Medicine

## 2024-03-01 ENCOUNTER — Ambulatory Visit (INDEPENDENT_AMBULATORY_CARE_PROVIDER_SITE_OTHER)

## 2024-03-01 DIAGNOSIS — E538 Deficiency of other specified B group vitamins: Secondary | ICD-10-CM | POA: Diagnosis not present

## 2024-03-01 MED ORDER — CYANOCOBALAMIN 1000 MCG/ML IJ SOLN
1000.0000 ug | Freq: Once | INTRAMUSCULAR | Status: AC
  Administered 2024-03-01: 1000 ug via INTRAMUSCULAR

## 2024-03-01 NOTE — Progress Notes (Signed)
 After obtaining consent, and per orders of Dr. Joshua, injection of B12 given by Ronnald SHAUNNA Palms. Patient instructed to report any adverse reaction to me immediately.

## 2024-03-03 ENCOUNTER — Other Ambulatory Visit: Payer: Self-pay | Admitting: Internal Medicine

## 2024-03-03 LAB — LIPID PANEL
Cholesterol: 178 mg/dL (ref <200)
HDL: 54 mg/dL (ref >=50)
LDL Cholesterol (Calc): 101 mg/dL — ABNORMAL HIGH
Non-HDL Cholesterol (Calc): 124 mg/dL (ref <130)
Total CHOL/HDL Ratio: 3.3 (calc) (ref <5.0)
Triglycerides: 133 mg/dL (ref <150)

## 2024-03-03 LAB — COMPREHENSIVE METABOLIC PANEL WITH GFR
AG Ratio: 1.2 (calc) (ref 1.0–2.5)
ALT: 13 U/L (ref 6–29)
AST: 10 U/L (ref 10–35)
Albumin: 3.8 g/dL (ref 3.6–5.1)
Alkaline phosphatase (APISO): 145 U/L (ref 37–153)
BUN: 12 mg/dL (ref 7–25)
CO2: 25 mmol/L (ref 20–32)
Calcium: 9.4 mg/dL (ref 8.6–10.4)
Chloride: 102 mmol/L (ref 98–110)
Creat: 0.6 mg/dL (ref 0.50–1.05)
Globulin: 3.3 g/dL (ref 1.9–3.7)
Glucose, Bld: 281 mg/dL — ABNORMAL HIGH (ref 65–99)
Potassium: 4.2 mmol/L (ref 3.5–5.3)
Sodium: 136 mmol/L (ref 135–146)
Total Bilirubin: 0.5 mg/dL (ref 0.2–1.2)
Total Protein: 7.1 g/dL (ref 6.1–8.1)
eGFR: 97 mL/min/1.73m2 (ref >=60)

## 2024-03-03 LAB — INSULIN-LIKE GROWTH FACTOR
IGF-I, LC/MS: 120 ng/mL (ref 41–279)
Z-Score (Female): 0.2 {STDV} (ref ?–2.0)

## 2024-03-03 LAB — MICROALBUMIN / CREATININE URINE RATIO
Creatinine, Urine: 187 mg/dL (ref 20–275)
Microalb Creat Ratio: 4 mg/g{creat} (ref <30)
Microalb, Ur: 0.8 mg/dL

## 2024-03-03 LAB — ACTH: C206 ACTH: 17 pg/mL (ref 6–50)

## 2024-03-03 LAB — CORTISOL: Cortisol, Plasma: 15.2 ug/dL

## 2024-03-06 ENCOUNTER — Inpatient Hospital Stay: Payer: Medicare Other | Attending: Hematology and Oncology | Admitting: Hematology and Oncology

## 2024-03-06 VITALS — BP 155/65 | HR 93 | Temp 97.9°F | Resp 18 | Ht 64.0 in | Wt 303.8 lb

## 2024-03-06 DIAGNOSIS — C50312 Malignant neoplasm of lower-inner quadrant of left female breast: Secondary | ICD-10-CM | POA: Diagnosis present

## 2024-03-06 DIAGNOSIS — Z923 Personal history of irradiation: Secondary | ICD-10-CM | POA: Insufficient documentation

## 2024-03-06 DIAGNOSIS — Z9221 Personal history of antineoplastic chemotherapy: Secondary | ICD-10-CM | POA: Insufficient documentation

## 2024-03-06 DIAGNOSIS — Z170 Estrogen receptor positive status [ER+]: Secondary | ICD-10-CM | POA: Diagnosis not present

## 2024-03-06 DIAGNOSIS — Z79811 Long term (current) use of aromatase inhibitors: Secondary | ICD-10-CM | POA: Diagnosis not present

## 2024-03-06 NOTE — Progress Notes (Signed)
 Patient Care Team: Joshua Debby CROME, MD as PCP - General (Internal Medicine) Ethyl Lenis, MD as Consulting Physician (General Surgery) Odean Potts, MD as Consulting Physician (Hematology and Oncology) Izell Domino, MD as Attending Physician (Radiation Oncology) Cathlyn JAYSON Nikki Bobie FORBES, MD as Consulting Physician (Obstetrics and Gynecology) Paul Barrio, OD as Referring Physician (Optometry)  DIAGNOSIS:  Encounter Diagnosis  Name Primary?   Malignant neoplasm of lower-inner quadrant of left breast in female, estrogen receptor positive (HCC) Yes    SUMMARY OF ONCOLOGIC HISTORY: Oncology History  Malignant neoplasm of lower-inner quadrant of left breast in female, estrogen receptor positive (HCC)  08/18/2016 Initial Diagnosis   Left breast biopsy 8:00 retroareolar position: IDC, high-grade with DCIS high-grade, ER 90%, PR 95%, Ki-67 20%, HER-2 positive ratio 3.4; screening detected left breast distortion LIQ 8 8:00 retroareolar 0.8 cm, axilla negative, T1b N0 stage IA clinical stage   09/22/2016 Surgery   Left lumpectomy: IDC grade 3, 1.3 cm, DCIS high-grade, margins clear, 0/1 lymph node negative, T1CN 0 stage IA, ER 90%, PR 95%, Ki-67 20%, HER-2 positive ratio 3.4   10/19/2016 - 01/04/2017 Chemotherapy   Taxol  Herceptin  weekly 12 followed by Herceptin  maintenance every 3 weeks for 1 year    01/27/2017 - 03/16/2017 Radiation Therapy   Adjuvant radiation therapy   05/10/2017 -  Anti-estrogen oral therapy   Anastrozole  1 mg p.o. daily     CHIEF COMPLIANT: Follow-up on anastrozole   HISTORY OF PRESENT ILLNESS:  History of Present Illness Sandra Wood is a 68 year old female who presents for a final follow-up after seven years of treatment for estrogen receptor-positive breast cancer.  She has completed seven years of anastrozole  therapy and remains in remission. A mammogram is scheduled for next week.  In early April, she experienced a fall while moving but  did not sustain fractures. She has osteopenia and is cautious to avoid falls.  She is on tirzepatide  injections for weight loss but has not experienced significant weight loss. She maintains an active lifestyle, caring for her elderly parents and grandchildren.  She has chronic osteoarthritis, particularly in her knees, with increased pain in one knee, especially with weather changes, despite previous surgeries.     ALLERGIES:  is allergic to metformin  and related and no known allergies.  MEDICATIONS:  Current Outpatient Medications  Medication Sig Dispense Refill   anastrozole  (ARIMIDEX ) 1 MG tablet Take 1 tablet (1 mg total) by mouth daily. 90 tablet 3   blood glucose meter kit and supplies KIT Dispense based on patient and insurance preference. Use up to four times daily as directed. (FOR ICD-9 250.00, 250.01). Check blood sugar BID. 1 each 0   Continuous Glucose Sensor (FREESTYLE LIBRE 3 PLUS SENSOR) MISC 1 Device by Other route every 14 (fourteen) days. Change sensor every 15 days. 6 each 3   glipiZIDE  (GLUCOTROL ) 5 MG tablet Take 1 tablet (5 mg total) by mouth 2 (two) times daily before a meal. 180 tablet 3   glucose blood (ONETOUCH VERIO) test strip Use as instructed 100 each 12   insulin  degludec (TRESIBA  FLEXTOUCH) 100 UNIT/ML FlexTouch Pen Inject 70 Units into the skin daily. 75 mL 3   Insulin  Pen Needle (BD PEN NEEDLE MICRO U/F) 32G X 6 MM MISC 1 Device by Does not apply route daily in the afternoon. 100 each 3   losartan  (COZAAR ) 50 MG tablet TAKE 1 TABLET BY MOUTH EVERY DAY 90 tablet 1   rosuvastatin  (CRESTOR ) 20 MG tablet Take  1 tablet (20 mg total) by mouth daily. 90 tablet 3   tirzepatide  (MOUNJARO ) 12.5 MG/0.5ML Pen Inject 12.5 mg into the skin once a week. 6 mL 3   No current facility-administered medications for this visit.    PHYSICAL EXAMINATION: ECOG PERFORMANCE STATUS: 1 - Symptomatic but completely ambulatory  Vitals:   03/06/24 0927  BP: (!) 155/65  Pulse: 93   Resp: 18  Temp: 97.9 F (36.6 C)  SpO2: 94%   Filed Weights   03/06/24 0927  Weight: (!) 303 lb 12.8 oz (137.8 kg)     LABORATORY DATA:  I have reviewed the data as listed    Latest Ref Rng & Units 02/29/2024    8:07 AM 08/27/2022    8:47 AM 04/15/2022    8:51 AM  CMP  Glucose 65 - 99 mg/dL 718  854  819   BUN 7 - 25 mg/dL 12  16  14    Creatinine 0.50 - 1.05 mg/dL 9.39  9.28  9.30   Sodium 135 - 146 mmol/L 136  139  137   Potassium 3.5 - 5.3 mmol/L 4.2  4.4  3.9   Chloride 98 - 110 mmol/L 102  102  103   CO2 20 - 32 mmol/L 25  27  26    Calcium  8.6 - 10.4 mg/dL 9.4  9.7  9.4   Total Protein 6.1 - 8.1 g/dL 7.1   7.8   Total Bilirubin 0.2 - 1.2 mg/dL 0.5   0.6   Alkaline Phos 39 - 117 U/L   133   AST 10 - 35 U/L 10   13   ALT 6 - 29 U/L 13   11     Lab Results  Component Value Date   WBC 10.4 08/27/2022   HGB 14.4 08/27/2022   HCT 43.4 08/27/2022   MCV 81.6 08/27/2022   PLT 360.0 08/27/2022   NEUTROABS 7.1 08/27/2022    ASSESSMENT & PLAN:  Malignant neoplasm of lower-inner quadrant of left breast in female, estrogen receptor positive (HCC) Left breast biopsy 08/18/2016: 8:00 retroareolar position: IDC, high-grade with DCIS high-grade, ER 90%, PR 95%, Ki-67 20%, HER-2 positive ratio 3.4; screening detected left breast distortion LIQ 8 8:00 retroareolar 0.8 cm, axilla negative, T1b N0 stage IA clinical stage Left lumpectomy 09/22/2016: IDC grade 3, 1.3 cm, DCIS high-grade, margins clear, 0/1 lymph node negative, T1 CN 0 stage IA, ER 90%, PR 95%, Ki-67 20%, HER-2 positive ratio 3.4    Treatment summary: 1. Adjuvant chemotherapy and Herceptin  with weekly Taxol  and Herceptin  12  completed 01/04/2017 followed by Herceptin  maintenance for 1 year completed 10/04/2017 3. Adjuvant radiation 01/27/2017-03/16/2017 4. Adjuvant antiestrogen therapy with letrozole 2.5 mg daily  7 years started  05/10/2017 ----------------------------------------------------------------------------- Diabetes: No change in the current plan   Anastrozole  toxicities: Mild hot flashes. She has chronic osteoarthritis related to weight. This concludes her antiestrogen therapy with anastrozole .  Therefore she can discontinue it at this time.   Breast cancer surveillance: 1. Mammogram and ultrasound 03/03/2023: Increasing calcifications and biopsy recommended, breast density category B Next mammogram scheduled for 03/14/2024   Bone density: T score -2.3: Osteopenia: Encouraged her to take vitamin D   Weight issues: on Mounjaro .  So far she has not been able to lose much weight   Return to clinic On an as-needed basis Assessment & Plan Estrogen receptor positive breast cancer, status post anastrozole  therapy Status post anastrozole  therapy. Anastrozole  discontinued. - Discontinue anastrozole . - Continue with scheduled mammogram next  week.  Osteopenia with increased fracture risk Osteopenia with 16.6% fracture risk. Low hip fracture risk, but other areas at risk. Recent fall highlights need for fall prevention. - Advise on fall prevention to reduce fracture risk.  Chronic osteoarthritis, status post bilateral knee surgery Chronic osteoarthritis persists, affecting surgically treated knee. Symptoms exacerbated by weather changes.  Obesity, suboptimal response to GLP-1 receptor agonist therapy Obesity persists despite tirzepatide  (Mounjaro ) treatment. No significant weight loss, possibly due to lifestyle factors and caregiving responsibilities.      No orders of the defined types were placed in this encounter.  The patient has a good understanding of the overall plan. she agrees with it. she will call with any problems that may develop before the next visit here. Total time spent: 30 mins including face to face time and time spent for planning, charting and co-ordination of care   Naomi MARLA Chad,  MD 03/06/24

## 2024-03-06 NOTE — Assessment & Plan Note (Signed)
 Left breast biopsy 08/18/2016: 8:00 retroareolar position: IDC, high-grade with DCIS high-grade, ER 90%, PR 95%, Ki-67 20%, HER-2 positive ratio 3.4; screening detected left breast distortion LIQ 8 8:00 retroareolar 0.8 cm, axilla negative, T1b N0 stage IA clinical stage Left lumpectomy 09/22/2016: IDC grade 3, 1.3 cm, DCIS high-grade, margins clear, 0/1 lymph node negative, T1 CN 0 stage IA, ER 90%, PR 95%, Ki-67 20%, HER-2 positive ratio 3.4    Treatment summary: 1. Adjuvant chemotherapy and Herceptin  with weekly Taxol  and Herceptin  12  completed 01/04/2017 followed by Herceptin  maintenance for 1 year completed 10/04/2017 3. Adjuvant radiation 01/27/2017-03/16/2017 4. Adjuvant antiestrogen therapy with letrozole 2.5 mg daily  7 years started 05/10/2017 ----------------------------------------------------------------------------- Diabetes: No change in the current plan   Anastrozole  toxicities: Mild hot flashes. She has chronic osteoarthritis related to weight.   Breast cancer surveillance: 1. Mammogram and ultrasound 03/03/2023: Increasing calcifications and biopsy recommended, breast density category B Next mammogram scheduled for 03/14/2024   Bone density: T score -2: Osteopenia: Encouraged her to take vitamin D and double up on the calcium .  Will get another bone density test in February 2025   Weight issues: Started Wegovy  3 weeks ago.  Previously she was on Rybelsus .   Return to clinic in 1 year for follow-up.  I sent a prescription refill for anastrozole .

## 2024-03-07 ENCOUNTER — Encounter: Payer: Self-pay | Admitting: Internal Medicine

## 2024-03-07 ENCOUNTER — Ambulatory Visit (INDEPENDENT_AMBULATORY_CARE_PROVIDER_SITE_OTHER): Admitting: Internal Medicine

## 2024-03-07 VITALS — BP 120/82 | HR 85 | Ht 64.0 in | Wt 304.8 lb

## 2024-03-07 DIAGNOSIS — E1142 Type 2 diabetes mellitus with diabetic polyneuropathy: Secondary | ICD-10-CM | POA: Diagnosis not present

## 2024-03-07 DIAGNOSIS — E1165 Type 2 diabetes mellitus with hyperglycemia: Secondary | ICD-10-CM

## 2024-03-07 DIAGNOSIS — E785 Hyperlipidemia, unspecified: Secondary | ICD-10-CM

## 2024-03-07 DIAGNOSIS — Z794 Long term (current) use of insulin: Secondary | ICD-10-CM

## 2024-03-07 LAB — POCT GLUCOSE (DEVICE FOR HOME USE): Glucose Fasting, POC: 212 mg/dL — AB (ref 70–99)

## 2024-03-07 LAB — POCT GLYCOSYLATED HEMOGLOBIN (HGB A1C): Hemoglobin A1C: 10.8 % — AB (ref 4.0–5.6)

## 2024-03-07 MED ORDER — BD PEN NEEDLE MICRO ULTRAFINE 32G X 6 MM MISC: 1.0000 | Freq: Four times a day (QID) | 3 refills | Status: AC

## 2024-03-07 MED ORDER — INSULIN LISPRO (1 UNIT DIAL) 100 UNIT/ML (KWIKPEN): 10.0000 [IU] | PEN_INJECTOR | Freq: Three times a day (TID) | SUBCUTANEOUS | 3 refills | Status: AC

## 2024-03-07 NOTE — Patient Instructions (Addendum)
-   Stop glipizide  - Continue Tresiba  66 Units daily  - Continue  mounjaro  12.5 mg once weekly  - Start Humalog  10 units with each meal, 5 units with a snack     HOW TO TREAT LOW BLOOD SUGARS (Blood sugar LESS THAN 70 MG/DL) Please follow the RULE OF 15 for the treatment of hypoglycemia treatment (when your (blood sugars are less than 70 mg/dL)   STEP 1: Take 15 grams of carbohydrates when your blood sugar is low, which includes:  3-4 GLUCOSE TABS  OR 3-4 OZ OF JUICE OR REGULAR SODA OR ONE TUBE OF GLUCOSE GEL    STEP 2: RECHECK blood sugar in 15 MINUTES STEP 3: If your blood sugar is still low at the 15 minute recheck --> then, go back to STEP 1 and treat AGAIN with another 15 grams of carbohydrates.

## 2024-03-07 NOTE — Progress Notes (Signed)
 Name: AJANI SCHNIEDERS  Age/ Sex: 68 y.o., female   MRN/ DOB: [id number], [birth year]     PCP: Joshua Debby LITTIE, MD   Reason for Endocrinology Evaluation: Type 2 Diabetes Mellitus  Initial Endocrine Consultative Visit: 11/07/2018    PATIENT IDENTIFIER: Ms. LOUISIANA SEARLES is a 68 y.o. female with a past medical history of HTN, T2DM, Asthma, Hx of Breast Ca (2018). The patient has followed with Endocrinology clinic since 11/07/2018 for consultative assistance with management of her diabetes.  DIABETIC HISTORY:  Ms. Schuelke was diagnosed with T2DM in 2013. Pt was on diet control until 08/2018 when she was started on Metformin , Rybelsus  and Insulin . Her hemoglobin A1c has ranged from 6.6% in 2013, peaking at 12.5% in 2020  On her initial visit to our clinic her A1c was 8.8 % . She was on Rybelsus , tresiba  and Metformin   Stopped Metformin  10/2020 due to GI side effects  Attempted to start Jardiance  through patient assistance program in January 2023  that she did not fill out    Switch Rybelsus  to Mounjaro  01/2023  Started glipizide  June, 2025 with an A1c of 11.1%   ACTH , serum cortisol, IGF-1 normal September, 2025  SUBJECTIVE:   During the last visit (12/01/2023): A1c 11.1%     Today (03/07/2024): Ms. Kozinski is here for a follow up on her diabetes management.  She checks  glucose 0 times daily .   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, she also takes care of 2 grand children in the afternoon   She did have yogurt around 2:30 AM   HOME DIABETES REGIMEN:  Glipizide  5 mg, 1 tablet BID Tresiba  66 units daily  Mounjaro  12.5 mg weekly      DIABETIC COMPLICATIONS: Microvascular complications:  neuropathy Denies: CKD,  Last eye exam: Completed 2023  Macrovascular complications:   Denies: CAD, PVD, CVA      HISTORY:  Past Medical  History:  Past Medical History:  Diagnosis Date   Abnormal Pap smear of cervix 2009   Allergic rhinitis, cause unspecified    Arthritis    Arthritis of ankle BILATERAL / HX FX'S   Asthma, mild persistent    Breast cancer (HCC)    Dyspnea    with much activity   Fibroids 2008   GERD (gastroesophageal reflux disease)    not current   History of radiation therapy 01/27/17-03/16/17   left breast 1.8 Gy in 28 fractions, left breast boost 2 Gy in 6 fractions   HSV-2 (herpes simplex virus 2) infection    at age 20   Malignant neoplasm of lower-inner quadrant of left breast in female, estrogen receptor positive (HCC) 08/20/2016   Obesity, morbid (HCC)    Personal history of chemotherapy    Personal history of radiation therapy    Pneumonia    2013, 2017   Tear of medial meniscus of knee LEFT   Type II or unspecified type diabetes mellitus without mention of complication, uncontrolled    Type II- has never been on medication   Past Surgical History:  Past Surgical History:  Procedure Laterality Date   BREAST BIOPSY     BREAST BIOPSY Left 03/10/2023   MM LT BREAST BX W LOC DEV 1ST LESION IMAGE BX SPEC STEREO GUIDE 03/10/2023 [facility]   BREAST LUMPECTOMY Left 09/22/2016   BREAST  LUMPECTOMY WITH RADIOACTIVE SEED AND SENTINEL LYMPH NODE BIOPSY Left 09/22/2016   Procedure: BREAST LUMPECTOMY WITH RADIOACTIVE SEED AND LEFT AXILLARY SENTINEL LYMPH NODE BIOPSY;  Surgeon: Alm Angle, MD;  Location: [facility] OR;  Service: General;  Laterality: Left;   CHONDROPLASTY  08/19/2011   Procedure: CHONDROPLASTY;  Surgeon: Lynwood SHAUNNA Bern, MD;  Location: [facility];  Service: Orthopedics;;  shaving of medial femeral chondral   COLONOSCOPY WITH PROPOFOL  N/A 08/17/2023   Procedure: COLONOSCOPY WITH PROPOFOL ;  Surgeon: San Sandor GAILS, DO;  Location: [facility];  Service: Gastroenterology;  Laterality: N/A;   DILATION AND CURETTAGE OF UTERUS     HYSTEROSCOPY WITH D & C  2008   benign  endometrial polyp - Dr. Toney Moats   KNEE ARTHROSCOPY W/ MENISCECTOMY Bilateral 07/07/2010   1 year apart   MENISECTOMY  08/19/2011   Procedure: MENISECTOMY;  Surgeon: Lynwood SHAUNNA Bern, MD;  [facility];  Service: Orthopedics;;  partial lateral menisectomy   POLYPECTOMY  08/17/2023   Procedure: POLYPECTOMY;  Surgeon: San Sandor GAILS, DO;  Location: [facility];  Service: Gastroenterology;;   ALVERA REMOVAL N/A 12/21/2017   Procedure: REMOVAL PORT-A-CATH;  Surgeon: Angle Alm, MD;  Location: [facility] OR;  Service: General;  Laterality: N/A;   PORTACATH PLACEMENT N/A 09/22/2016   Procedure: INSERTION PORT-A-CATH;  Surgeon: Alm Angle, MD;  Location: [facility] OR;  Service: General;  Laterality: N/A;   TONSILLECTOMY     TUBAL LIGATION  AGE 49   Social History:  reports that she quit smoking about 37 years ago. Her smoking use included cigarettes. She started smoking about 42 years ago. She has a 5 pack-year smoking history. She has never used smokeless tobacco. She reports that she does not currently use alcohol. She reports that she does not use drugs. Family History:  Family History  Problem Relation Age of Onset   Diabetes Mother    Heart disease Father    Colon polyps Father    Cancer Brother 55       prostate   Breast cancer Maternal Grandmother 78   Colon cancer Neg Hx    Esophageal cancer Neg Hx    Stomach cancer Neg Hx      HOME MEDICATIONS: Allergies as of 03/07/2024       Reactions   Metformin  And Related    GI upset    No Known Allergies         Medication List        Accurate as of March 07, 2024  8:25 AM. If you have any questions, ask your nurse or doctor.          BD Pen Needle Micro U/F 32G X 6 MM Misc Generic drug: Insulin  Pen Needle 1 Device by Does not apply route daily in the afternoon.   blood glucose meter kit and supplies Kit Dispense based on patient and insurance preference. Use up to four times daily as directed.  (FOR ICD-9 250.00, 250.01). Check blood sugar BID.   FreeStyle Libre 3 Plus Sensor Misc 1 Device by Other route every 14 (fourteen) days. Change sensor every 15 days.   glipiZIDE  5 MG tablet Commonly known as: GLUCOTROL  Take 1 tablet (5 mg total) by mouth 2 (two) times daily before a meal.   losartan  50 MG tablet Commonly known as: COZAAR  TAKE 1 TABLET BY MOUTH EVERY DAY   OneTouch Verio test strip Generic drug: glucose blood Use as instructed   rosuvastatin  20 MG tablet Commonly  known as: CRESTOR  Take 1 tablet (20 mg total) by mouth daily.   tirzepatide  12.5 MG/0.5ML Pen Commonly known as: MOUNJARO  Inject 12.5 mg into the skin once a week.   Tresiba  FlexTouch 100 UNIT/ML FlexTouch Pen Generic drug: insulin  degludec Inject 70 Units into the skin daily. What changed: how much to take         OBJECTIVE:   Vital Signs: BP 120/82 (BP Location: Left Arm, Patient Position: Sitting, Cuff Size: Normal)   Pulse 85   Ht 5' 4 (1.626 m)   Wt (!) 304 lb 12.8 oz (138.3 kg)   LMP 06/30/2003   SpO2 92%   BMI 52.32 kg/m   Wt Readings from Last 3 Encounters:  03/07/24 (!) 304 lb 12.8 oz (138.3 kg)  03/06/24 (!) 303 lb 12.8 oz (137.8 kg)  02/03/24 295 lb (133.8 kg)     Exam: General: Pt appears well and is in NAD  Lungs: Clear with good BS bilat   Heart: RRR   Extremities: No pretibial edema.   Neuro: MS is good with appropriate affect, pt is alert and Ox3       DM foot exam: 05/03/2023    The skin of the feet is intact without sores or ulcerations.Plantar callous formation noted bilaterally The pedal pulses are 2+ on right and 2+ on left. The sensation is intact to a screening 5.07, 10 gram monofilament bilaterally at the heels       DATA REVIEWED:  Lab Results  Component Value Date   HGBA1C 11.1 (A) 12/01/2023   HGBA1C 10.6 (A) 09/01/2023   HGBA1C 10.2 (A) 05/03/2023    Latest Reference Range & Units 02/29/24 08:07  Sodium 135 - 146 mmol/L 136   Potassium 3.5 - 5.3 mmol/L 4.2  Chloride 98 - 110 mmol/L 102  CO2 20 - 32 mmol/L 25  Glucose 65 - 99 mg/dL 718 (H)  BUN 7 - 25 mg/dL 12  Creatinine 9.49 - 8.94 mg/dL 9.39  Calcium  8.6 - 10.4 mg/dL 9.4  BUN/Creatinine Ratio 6 - 22 (calc) SEE NOTE:  eGFR > OR = 60 mL/min/1.33m2 97  AG Ratio 1.0 - 2.5 (calc) 1.2  AST 10 - 35 U/L 10  ALT 6 - 29 U/L 13  Total Protein 6.1 - 8.1 g/dL 7.1  Total Bilirubin 0.2 - 1.2 mg/dL 0.5  Total CHOL/HDL Ratio <5.0 (calc) 3.3  Cholesterol <200 mg/dL 821  HDL Cholesterol > OR = 50 mg/dL 54  LDL Cholesterol (Calc) mg/dL (calc) 898 (H)  MICROALB/CREAT RATIO <30 mg/g creat 4    Latest Reference Range & Units 02/29/24 08:07  C206 ACTH  6 - 50 pg/mL 17  Cortisol, Plasma mcg/dL 84.7    Latest Reference Range & Units 02/29/24 08:07  Microalb, Ur mg/dL 0.8  MICROALB/CREAT RATIO <30 mg/g creat 4  Creatinine, Urine 20 - 275 mg/dL 812    Latest Reference Range & Units 02/29/24 08:07  IGF-I, LC/MS 41 - 279 ng/mL 120    Old records , labs and images have been reviewed.    In office BG 212 MGs/DL (she ate yogurt around 2:30 AM)   ASSESSMENT / PLAN / RECOMMENDATIONS:   1) Type 2 Diabetes Mellitus, Poorly controlled, With Neuropathic complications - Most recent A1c of 10.8 %. Goal A1c < 7.0 %.    - A1c continues to be elevated despite increasing insulin  and starting glipizide  -Patient assures me compliance with her glycemic agents - Intolerant to  metformin  due to GI issues - CGM cost  prohibitive including Dexcom and freestyle libre - Jardiance  cost-prohibitive , I have attempted to obtain patient assistance for her, but the patient is skeptical about side effects - After that her on glipizide , but her glucose continues to be elevated - I have recommended short acting insulin  with meals/snacks - Patient advised to take her insulin  pen, and to inject within 5 minutes of eating and not to wait longer than 15 minutes, to prevent hypoglycemia. 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 Call [facility] if: BG persistently < 70 I reviewed the Rule of 15 for the treatment of hypoglycemia in detail with the patient. Literature supplied.   2) Dyslipidemia :  -LDL is elevated - Assures me compliance with rosuvastatin  - Discussed low fat diet  Medication Continue rosuvastatin  20 mg daily   F/U in 4 months    Signed electronically by: Stefano Redgie Butts, MD  [facility]  [facility] [address] [place] [place], [place] [address] Phone: [phone] FAX: [phone]   CC: Joshua Debby CROME, MD [address] [place] [address] Phone: [phone]  Fax: [phone]  Return to Endocrinology clinic as below: Future Appointments  Date Time Provider Department Center  03/14/2024  8:00 AM [facility] DIAG TOMO 1 [facility]  03/20/2024  9:00 AM Joshua Debby CROME, MD [facility]  03/31/2024 10:00 AM [facility] CLINICAL SUPPORT [facility]  02/08/2025  8:50 AM [facility]-ANNUAL WELLNESS VISIT [facility] Stains

## 2024-03-10 ENCOUNTER — Encounter: Payer: Self-pay | Admitting: Hematology and Oncology

## 2024-03-10 ENCOUNTER — Ambulatory Visit
Admission: RE | Admit: 2024-03-10 | Discharge: 2024-03-10 | Disposition: A | Discharge status: Home / Self Care | Source: Ambulatory Visit | Attending: Hematology and Oncology | Admitting: Hematology and Oncology

## 2024-03-10 DIAGNOSIS — Z853 Personal history of malignant neoplasm of breast: Secondary | ICD-10-CM

## 2024-03-12 ENCOUNTER — Other Ambulatory Visit: Payer: Self-pay | Admitting: Hematology and Oncology

## 2024-03-13 NOTE — Telephone Encounter (Signed)
 Refill NOT appropriate per last office note.  IT was discontinued due to time on the medication and in remission.

## 2024-03-14 ENCOUNTER — Encounter

## 2024-03-20 ENCOUNTER — Ambulatory Visit: Payer: Self-pay | Admitting: Internal Medicine

## 2024-03-20 ENCOUNTER — Ambulatory Visit (INDEPENDENT_AMBULATORY_CARE_PROVIDER_SITE_OTHER): Admitting: Internal Medicine

## 2024-03-20 VITALS — BP 136/76 | HR 85 | Temp 97.8°F | Resp 16 | Ht 64.0 in | Wt 305.0 lb

## 2024-03-20 DIAGNOSIS — G63 Polyneuropathy in diseases classified elsewhere: Secondary | ICD-10-CM

## 2024-03-20 DIAGNOSIS — R0609 Other forms of dyspnea: Secondary | ICD-10-CM

## 2024-03-20 DIAGNOSIS — E1142 Type 2 diabetes mellitus with diabetic polyneuropathy: Secondary | ICD-10-CM

## 2024-03-20 DIAGNOSIS — I10 Essential (primary) hypertension: Secondary | ICD-10-CM

## 2024-03-20 DIAGNOSIS — E538 Deficiency of other specified B group vitamins: Secondary | ICD-10-CM | POA: Diagnosis not present

## 2024-03-20 DIAGNOSIS — I119 Hypertensive heart disease without heart failure: Secondary | ICD-10-CM | POA: Diagnosis not present

## 2024-03-20 DIAGNOSIS — R9431 Abnormal electrocardiogram [ECG] [EKG]: Secondary | ICD-10-CM | POA: Diagnosis not present

## 2024-03-20 DIAGNOSIS — Z23 Encounter for immunization: Secondary | ICD-10-CM | POA: Insufficient documentation

## 2024-03-20 LAB — CBC WITH DIFFERENTIAL/PLATELET
Basophils Absolute: 0.1 K/uL (ref 0.0–0.1)
Basophils Relative: 0.6 % (ref 0.0–3.0)
Eosinophils Absolute: 0.3 K/uL (ref 0.0–0.7)
Eosinophils Relative: 3.5 % (ref 0.0–5.0)
HCT: 44.4 % (ref 36.0–46.0)
Hemoglobin: 14.2 g/dL (ref 12.0–15.0)
Lymphocytes Relative: 21.3 % (ref 12.0–46.0)
Lymphs Abs: 2.1 K/uL (ref 0.7–4.0)
MCHC: 32 g/dL (ref 30.0–36.0)
MCV: 82.9 fl (ref 78.0–100.0)
Monocytes Absolute: 0.8 K/uL (ref 0.1–1.0)
Monocytes Relative: 8 % (ref 3.0–12.0)
Neutro Abs: 6.6 K/uL (ref 1.4–7.7)
Neutrophils Relative %: 66.6 % (ref 43.0–77.0)
Platelets: 341 K/uL (ref 150.0–400.0)
RBC: 5.36 Mil/uL — ABNORMAL HIGH (ref 3.87–5.11)
RDW: 14.8 % (ref 11.5–15.5)
WBC: 9.9 K/uL (ref 4.0–10.5)

## 2024-03-20 LAB — TSH: TSH: 2.99 u[IU]/mL (ref 0.35–5.50)

## 2024-03-20 LAB — FOLATE: Folate: 11.2 ng/mL (ref >5.9)

## 2024-03-20 LAB — BRAIN NATRIURETIC PEPTIDE: Pro B Natriuretic peptide (BNP): 13 pg/mL (ref 0.0–100.0)

## 2024-03-20 LAB — TROPONIN I (HIGH SENSITIVITY): High Sens Troponin I: 4 ng/L (ref 2–17)

## 2024-03-20 MED ORDER — BOOSTRIX 5-2.5-18.5 LF-MCG/0.5 IM SUSP: 0.5000 mL | Freq: Once | INTRAMUSCULAR | 0 refills | Status: AC

## 2024-03-20 NOTE — Progress Notes (Signed)
 Subjective:  Patient ID: Sandra Wood, female    DOB: Apr 08, 1956  Age: 68 y.o. MRN: [id number]  CC: Hypertension   HPI Sandra Wood presents for f/up---  Discussed the use of AI scribe software for clinical note transcription with the patient, who gave verbal consent to proceed.  History of Present Illness Sandra Wood is a 68 year old female who presents for follow-up of shortness of breath and diabetes management.  She experiences shortness of breath primarily when ascending or descending stairs. No associated chest pain is noted. No headache or blurred vision.  Her last hemoglobin A1c was 10.8%. She mentions some progress in managing her diabetes. She is currently receiving B12 injections, with the last one administered at the end of September and the next scheduled for October 3rd. No symptoms of anemia.     Outpatient Medications Prior to Visit  Medication Sig Dispense Refill   blood glucose meter kit and supplies KIT Dispense based on patient and insurance preference. Use up to four times daily as directed. (FOR ICD-9 250.00, 250.01). Check blood sugar BID. 1 each 0   glucose blood (ONETOUCH VERIO) test strip Use as instructed 100 each 12   insulin  degludec (TRESIBA  FLEXTOUCH) 100 UNIT/ML FlexTouch Pen Inject 70 Units into the skin daily. (Patient taking differently: Inject 66 Units into the skin daily.) 75 mL 3   insulin  lispro (HUMALOG  KWIKPEN) 100 UNIT/ML KwikPen Inject 10 Units into the skin 3 (three) times daily. 30 mL 3   Insulin  Pen Needle (BD PEN NEEDLE MICRO ULTRAFINE) 32G X 6 MM MISC 1 Device by Does not apply route in the morning, at noon, in the evening, and at bedtime. 400 each 3   losartan  (COZAAR ) 50 MG tablet TAKE 1 TABLET BY MOUTH EVERY DAY 90 tablet 1   rosuvastatin  (CRESTOR ) 20 MG tablet Take 1 tablet (20 mg total) by mouth daily. 90 tablet 3   tirzepatide  (MOUNJARO ) 12.5 MG/0.5ML Pen Inject 12.5 mg into the skin once a week. 6 mL 3    No facility-administered medications prior to visit.    ROS Review of Systems  Constitutional:  Negative for appetite change, chills, diaphoresis, fatigue and fever.  HENT: Negative.    Respiratory:  Positive for shortness of breath (DOE). Negative for cough, choking, chest tightness and wheezing.   Cardiovascular:  Negative for chest pain, palpitations and leg swelling.  Gastrointestinal:  Negative for abdominal pain, constipation and diarrhea.  Genitourinary: Negative.  Negative for difficulty urinating.  Musculoskeletal:  Positive for arthralgias and gait problem.  Skin: Negative.   Neurological:  Negative for dizziness and weakness.  Hematological:  Negative for adenopathy. Does not bruise/bleed easily.  Psychiatric/Behavioral: Negative.      Objective:  BP 136/76 (BP Location: Right Arm, Patient Position: Sitting, Cuff Size: Large)   Pulse 85   Temp 97.8 F (36.6 C) (Oral)   Resp 16   Ht 5' 4 (1.626 m)   Wt (!) 305 lb (138.3 kg)   LMP 06/30/2003   SpO2 92%   BMI 52.35 kg/m   BP Readings from Last 3 Encounters:  03/20/24 136/76  03/07/24 120/82  03/06/24 (!) 155/65    Wt Readings from Last 3 Encounters:  03/20/24 (!) 305 lb (138.3 kg)  03/07/24 (!) 304 lb 12.8 oz (138.3 kg)  03/06/24 (!) 303 lb 12.8 oz (137.8 kg)    Physical Exam Vitals reviewed.  Constitutional:      Appearance: She is obese.  HENT:  Nose: Nose normal.     Mouth/Throat:     Mouth: Mucous membranes are moist.  Eyes:     General: No scleral icterus.    Conjunctiva/sclera: Conjunctivae normal.  Cardiovascular:     Rate and Rhythm: Normal rate and regular rhythm.     Heart sounds: No murmur heard.    No friction rub. No gallop.     Comments: EKG--- NSR, 87 bpm Moderate LVH Septal infarct pattern is new Pulmonary:     Effort: Pulmonary effort is normal.     Breath sounds: No stridor. No wheezing, rhonchi or rales.  Abdominal:     General: Abdomen is protuberant. Bowel sounds  are normal.     Palpations: There is no hepatomegaly, splenomegaly or mass.     Tenderness: There is no abdominal tenderness.  Musculoskeletal:        General: No swelling. Normal range of motion.     Cervical back: Neck supple.     Right lower leg: No edema.     Left lower leg: No edema.  Lymphadenopathy:     Cervical: No cervical adenopathy.  Skin:    General: Skin is warm and dry.  Neurological:     General: No focal deficit present.     Mental Status: Mental status is at baseline.     Lab Results  Component Value Date   WBC 9.9 03/20/2024   HGB 14.2 03/20/2024   HCT 44.4 03/20/2024   PLT 341.0 03/20/2024   GLUCOSE 281 (H) 02/29/2024   CHOL 178 02/29/2024   TRIG 133 02/29/2024   HDL 54 02/29/2024   LDLDIRECT 177.0 09/13/2018   LDLCALC 101 (H) 02/29/2024   ALT 13 02/29/2024   AST 10 02/29/2024   NA 136 02/29/2024   K 4.2 02/29/2024   CL 102 02/29/2024   CREATININE 0.60 02/29/2024   BUN 12 02/29/2024   CO2 25 02/29/2024   TSH 2.99 03/20/2024   INR 1.1 RATIO 07/09/2006   HGBA1C 10.8 (A) 03/07/2024   MICROALBUR 0.8 02/29/2024    MM 3D DIAGNOSTIC MAMMOGRAM BILATERAL BREAST Result Date: 03/10/2024 CLINICAL DATA:  Patient is status post stereotactic guided biopsy LEFT breast demonstrating benign fibrosis with calcifications. History of remote LEFT lumpectomy. Calcifications were placed under BI-RADS 3 observation in August 2023. EXAM: DIGITAL DIAGNOSTIC BILATERAL MAMMOGRAM WITH TOMOSYNTHESIS AND CAD TECHNIQUE: Bilateral digital diagnostic mammography and breast tomosynthesis was performed. The images were evaluated with computer-aided detection. Best images possible per technologist communication. COMPARISON:  Previous exam(s). ACR Breast Density Category c: The breasts are heterogeneously dense, which may obscure small masses. FINDINGS: Spot magnification views of the LEFT breast demonstrate multiple smooth predominately rod type calcifications in the LEFT upper breast  superior to the lumpectomy bed. X shaped biopsy marking clip is in the site of these calcifications. Previously biopsied calcifications were morphologically similar in appearance. There is density and architectural distortion within the LEFT breast, consistent with postsurgical changes. These are stable in comparison to prior. No suspicious mass, distortion, or microcalcifications are identified to suggest presence of malignancy. IMPRESSION: No mammographic evidence of malignancy bilaterally. RECOMMENDATION: Per protocol, as the patient is now 2 or more years status post lumpectomy, she may return to annual screening mammography in 1 year. However, given the history of breast cancer, the patient remains eligible for annual diagnostic mammography if preferred. (Code:SM-B-01Y) I have discussed the findings and recommendations with the patient. If applicable, a reminder letter will be sent to the patient regarding the next appointment. BI-RADS  CATEGORY  2: Benign. Electronically Signed   By: Corean Salter M.D.   On: 03/10/2024 13:35    Assessment & Plan:  Vitamin B12 deficiency neuropathy (HCC) -     CBC with Differential/Platelet; Future -     Folate; Future  LVH (left ventricular hypertrophy) due to hypertensive disease, without heart failure -     ECHOCARDIOGRAM COMPLETE; Future  Essential hypertension- BP is well controlled. -     TSH; Future -     EKG 12-Lead  Obesity, morbid (HCC) -     TSH; Future  Type 2 diabetes mellitus with diabetic polyneuropathy, without long-term current use of insulin  (HCC) -     HM Diabetes Foot Exam  Abnormal EKG - Labs are normal. Will evaluate with an ECHO. -     Brain natriuretic peptide; Future -     Troponin I (High Sensitivity); Future -     ECHOCARDIOGRAM COMPLETE; Future  DOE (dyspnea on exertion) -     Brain natriuretic peptide; Future -     Troponin I (High Sensitivity); Future -     ECHOCARDIOGRAM COMPLETE; Future  Need for prophylactic  vaccination with combined diphtheria-tetanus-pertussis (DTP) vaccine -     Boostrix ; Inject 0.5 mLs into the muscle once for 1 dose.  Dispense: 0.5 mL; Refill: 0     Follow-up: Return in about 4 months (around 07/20/2024).  Debby Molt, MD

## 2024-03-20 NOTE — Patient Instructions (Signed)
 Hypertension, Adult High blood pressure (hypertension) is when the force of blood pumping through the arteries is too strong. The arteries are the blood vessels that carry blood from the heart throughout the body. Hypertension forces the heart to work harder to pump blood and may cause arteries to become narrow or stiff. Untreated or uncontrolled hypertension can lead to a heart attack, heart failure, a stroke, kidney disease, and other problems. A blood pressure reading consists of a higher number over a lower number. Ideally, your blood pressure should be below 120/80. The first ("top") number is called the systolic pressure. It is a measure of the pressure in your arteries as your heart beats. The second ("bottom") number is called the diastolic pressure. It is a measure of the pressure in your arteries as the heart relaxes. What are the causes? The exact cause of this condition is not known. There are some conditions that result in high blood pressure. What increases the risk? Certain factors may make you more likely to develop high blood pressure. Some of these risk factors are under your control, including: Smoking. Not getting enough exercise or physical activity. Being overweight. Having too much fat, sugar, calories, or salt (sodium) in your diet. Drinking too much alcohol. Other risk factors include: Having a personal history of heart disease, diabetes, high cholesterol, or kidney disease. Stress. Having a family history of high blood pressure and high cholesterol. Having obstructive sleep apnea. Age. The risk increases with age. What are the signs or symptoms? High blood pressure may not cause symptoms. Very high blood pressure (hypertensive crisis) may cause: Headache. Fast or irregular heartbeats (palpitations). Shortness of breath. Nosebleed. Nausea and vomiting. Vision changes. Severe chest pain, dizziness, and seizures. How is this diagnosed? This condition is diagnosed by  measuring your blood pressure while you are seated, with your arm resting on a flat surface, your legs uncrossed, and your feet flat on the floor. The cuff of the blood pressure monitor will be placed directly against the skin of your upper arm at the level of your heart. Blood pressure should be measured at least twice using the same arm. Certain conditions can cause a difference in blood pressure between your right and left arms. If you have a high blood pressure reading during one visit or you have normal blood pressure with other risk factors, you may be asked to: Return on a different day to have your blood pressure checked again. Monitor your blood pressure at home for 1 week or longer. If you are diagnosed with hypertension, you may have other blood or imaging tests to help your health care provider understand your overall risk for other conditions. How is this treated? This condition is treated by making healthy lifestyle changes, such as eating healthy foods, exercising more, and reducing your alcohol intake. You may be referred for counseling on a healthy diet and physical activity. Your health care provider may prescribe medicine if lifestyle changes are not enough to get your blood pressure under control and if: Your systolic blood pressure is above 130. Your diastolic blood pressure is above 80. Your personal target blood pressure may vary depending on your medical conditions, your age, and other factors. Follow these instructions at home: Eating and drinking  Eat a diet that is high in fiber and potassium, and low in sodium, added sugar, and fat. An example of this eating plan is called the DASH diet. DASH stands for Dietary Approaches to Stop Hypertension. To eat this way: Eat  plenty of fresh fruits and vegetables. Try to fill one half of your plate at each meal with fruits and vegetables. Eat whole grains, such as whole-wheat pasta, brown rice, or whole-grain bread. Fill about one  fourth of your plate with whole grains. Eat or drink low-fat dairy products, such as skim milk or low-fat yogurt. Avoid fatty cuts of meat, processed or cured meats, and poultry with skin. Fill about one fourth of your plate with lean proteins, such as fish, chicken without skin, beans, eggs, or tofu. Avoid pre-made and processed foods. These tend to be higher in sodium, added sugar, and fat. Reduce your daily sodium intake. Many people with hypertension should eat less than 1,500 mg of sodium a day. Do not drink alcohol if: Your health care provider tells you not to drink. You are pregnant, may be pregnant, or are planning to become pregnant. If you drink alcohol: Limit how much you have to: 0-1 drink a day for women. 0-2 drinks a day for men. Know how much alcohol is in your drink. In the [place] one drink equals one 12 oz bottle of beer (355 mL), one 5 oz glass of wine (148 mL), or one 1 oz glass of hard liquor (44 mL). Lifestyle  Work with your health care provider to maintain a healthy body weight or to lose weight. Ask what an ideal weight is for you. Get at least 30 minutes of exercise that causes your heart to beat faster (aerobic exercise) most days of the week. Activities may include walking, swimming, or biking. Include exercise to strengthen your muscles (resistance exercise), such as Pilates or lifting weights, as part of your weekly exercise routine. Try to do these types of exercises for 30 minutes at least 3 days a week. Do not use any products that contain nicotine or tobacco. These products include cigarettes, chewing tobacco, and vaping devices, such as e-cigarettes. If you need help quitting, ask your health care provider. Monitor your blood pressure at home as told by your health care provider. Keep all follow-up visits. This is important. Medicines Take over-the-counter and prescription medicines only as told by your health care provider. Follow directions carefully. Blood  pressure medicines must be taken as prescribed. Do not skip doses of blood pressure medicine. Doing this puts you at risk for problems and can make the medicine less effective. Ask your health care provider about side effects or reactions to medicines that you should watch for. Contact a health care provider if you: Think you are having a reaction to a medicine you are taking. Have headaches that keep coming back (recurring). Feel dizzy. Have swelling in your ankles. Have trouble with your vision. Get help right away if you: Develop a severe headache or confusion. Have unusual weakness or numbness. Feel faint. Have severe pain in your chest or abdomen. Vomit repeatedly. Have trouble breathing. These symptoms may be an emergency. Get help right away. Call 911. Do not wait to see if the symptoms will go away. Do not drive yourself to the hospital. Summary Hypertension is when the force of blood pumping through your arteries is too strong. If this condition is not controlled, it may put you at risk for serious complications. Your personal target blood pressure may vary depending on your medical conditions, your age, and other factors. For most people, a normal blood pressure is less than 120/80. Hypertension is treated with lifestyle changes, medicines, or a combination of both. Lifestyle changes include losing weight, eating a healthy,  low-sodium diet, exercising more, and limiting alcohol. This information is not intended to replace advice given to you by your health care provider. Make sure you discuss any questions you have with your health care provider. Document Revised: 04/22/2021 Document Reviewed: 04/22/2021 [employer] Patient Education  2024 [employer].

## 2024-03-22 ENCOUNTER — Other Ambulatory Visit: Payer: Self-pay | Admitting: Internal Medicine

## 2024-03-22 DIAGNOSIS — I119 Hypertensive heart disease without heart failure: Secondary | ICD-10-CM

## 2024-03-22 DIAGNOSIS — I10 Essential (primary) hypertension: Secondary | ICD-10-CM

## 2024-03-31 ENCOUNTER — Ambulatory Visit (INDEPENDENT_AMBULATORY_CARE_PROVIDER_SITE_OTHER)

## 2024-03-31 DIAGNOSIS — G63 Polyneuropathy in diseases classified elsewhere: Secondary | ICD-10-CM | POA: Diagnosis not present

## 2024-03-31 DIAGNOSIS — E538 Deficiency of other specified B group vitamins: Secondary | ICD-10-CM

## 2024-03-31 DIAGNOSIS — Z23 Encounter for immunization: Secondary | ICD-10-CM

## 2024-03-31 MED ORDER — CYANOCOBALAMIN 1000 MCG/ML IJ SOLN
1000.0000 ug | Freq: Once | INTRAMUSCULAR | Status: AC
  Administered 2024-03-31: 1000 ug via INTRAMUSCULAR

## 2024-03-31 NOTE — Progress Notes (Signed)
 Patient visits today for their high dose flu vaccine and their b-12 injection. Patient informed of what they had received and tolerated both injections well. Patient notified to reach out to the office if needed

## 2024-04-25 ENCOUNTER — Ambulatory Visit (HOSPITAL_COMMUNITY)
Admission: RE | Admit: 2024-04-25 | Discharge: 2024-04-25 | Disposition: A | Discharge status: Home / Self Care | Source: Ambulatory Visit | Attending: Cardiology | Admitting: Cardiology

## 2024-04-25 DIAGNOSIS — R0609 Other forms of dyspnea: Secondary | ICD-10-CM | POA: Insufficient documentation

## 2024-04-25 DIAGNOSIS — I119 Hypertensive heart disease without heart failure: Secondary | ICD-10-CM | POA: Diagnosis not present

## 2024-04-25 DIAGNOSIS — R9431 Abnormal electrocardiogram [ECG] [EKG]: Secondary | ICD-10-CM | POA: Diagnosis present

## 2024-04-25 LAB — ECHOCARDIOGRAM COMPLETE
Area-P 1/2: 4.29 cm2
S' Lateral: 2.4 cm

## 2024-04-25 MED ORDER — PERFLUTREN LIPID MICROSPHERE
1.0000 mL | INTRAVENOUS | Status: AC | PRN
  Administered 2024-04-25: 2 mL via INTRAVENOUS

## 2024-05-01 ENCOUNTER — Ambulatory Visit (INDEPENDENT_AMBULATORY_CARE_PROVIDER_SITE_OTHER)

## 2024-05-01 DIAGNOSIS — E538 Deficiency of other specified B group vitamins: Secondary | ICD-10-CM

## 2024-05-01 DIAGNOSIS — G63 Polyneuropathy in diseases classified elsewhere: Secondary | ICD-10-CM | POA: Diagnosis not present

## 2024-05-01 MED ORDER — CYANOCOBALAMIN 1000 MCG/ML IJ SOLN
1000.0000 ug | Freq: Once | INTRAMUSCULAR | Status: AC
  Administered 2024-05-01: 1000 ug via INTRAMUSCULAR

## 2024-05-01 NOTE — Progress Notes (Signed)
 Pt here for monthly B12 injection per Dr.Jones  B12 1000mcg given IM and pt tolerated injection well.  Next B12 injection scheduled for next month

## 2024-06-01 ENCOUNTER — Ambulatory Visit

## 2024-06-01 DIAGNOSIS — E538 Deficiency of other specified B group vitamins: Secondary | ICD-10-CM

## 2024-06-01 MED ORDER — CYANOCOBALAMIN 1000 MCG/ML IJ SOLN
1000.0000 ug | Freq: Once | INTRAMUSCULAR | Status: AC
  Administered 2024-06-01: 1000 ug via INTRAMUSCULAR

## 2024-06-01 NOTE — Progress Notes (Signed)
Pt was given B12 injection w/o any complications. 

## 2024-07-03 ENCOUNTER — Ambulatory Visit (INDEPENDENT_AMBULATORY_CARE_PROVIDER_SITE_OTHER)

## 2024-07-03 DIAGNOSIS — E538 Deficiency of other specified B group vitamins: Secondary | ICD-10-CM

## 2024-07-03 MED ORDER — CYANOCOBALAMIN 1000 MCG/ML IJ SOLN
1000.0000 ug | Freq: Once | INTRAMUSCULAR | Status: AC
  Administered 2024-07-03: 1000 ug via INTRAMUSCULAR

## 2024-07-03 NOTE — Progress Notes (Signed)
 After obtaining consent, and per orders of Dr. Joshua, injection of B12 given by Ronnald SHAUNNA Palms. Patient instructed to report any adverse reaction to me immediately.

## 2024-07-06 ENCOUNTER — Ambulatory Visit: Admitting: Internal Medicine

## 2024-07-31 ENCOUNTER — Ambulatory Visit

## 2024-08-02 ENCOUNTER — Ambulatory Visit

## 2024-08-04 ENCOUNTER — Ambulatory Visit

## 2024-08-10 ENCOUNTER — Ambulatory Visit

## 2025-02-08 ENCOUNTER — Ambulatory Visit
